# Patient Record
Sex: Female | Born: 1950
Health system: Southern US, Community
[De-identification: ages and names within clinical notes are randomized; demographics above are authoritative.]

## PROBLEM LIST (undated history)

## (undated) DIAGNOSIS — C8510 Unspecified B-cell lymphoma, unspecified site: Secondary | ICD-10-CM

## (undated) HISTORY — PX: OTHER SURGICAL HISTORY: SHX169

---

## 2017-06-26 DIAGNOSIS — C8510 Unspecified B-cell lymphoma, unspecified site: Secondary | ICD-10-CM

## 2017-06-26 HISTORY — DX: Unspecified B-cell lymphoma, unspecified site: C85.10

## 2017-07-13 ENCOUNTER — Ambulatory Visit (HOSPITAL_BASED_OUTPATIENT_CLINIC_OR_DEPARTMENT_OTHER): Payer: Self-pay

## 2017-07-13 ENCOUNTER — Telehealth: Payer: Self-pay | Admitting: Hematology

## 2017-07-13 ENCOUNTER — Encounter: Payer: Self-pay | Admitting: Hematology

## 2017-07-13 ENCOUNTER — Ambulatory Visit (HOSPITAL_BASED_OUTPATIENT_CLINIC_OR_DEPARTMENT_OTHER): Payer: Self-pay | Admitting: Hematology

## 2017-07-13 ENCOUNTER — Other Ambulatory Visit (HOSPITAL_COMMUNITY)
Admission: RE | Admit: 2017-07-13 | Discharge: 2017-07-13 | Disposition: A | Discharge status: Home / Self Care | Payer: Self-pay | Source: Ambulatory Visit | Attending: Hematology | Admitting: Hematology

## 2017-07-13 ENCOUNTER — Encounter: Payer: Self-pay | Admitting: *Deleted

## 2017-07-13 VITALS — BP 119/73 | HR 78 | Temp 98.2°F | Resp 18 | Ht 66.0 in | Wt 147.3 lb

## 2017-07-13 DIAGNOSIS — D649 Anemia, unspecified: Secondary | ICD-10-CM

## 2017-07-13 DIAGNOSIS — C8307 Small cell B-cell lymphoma, spleen: Secondary | ICD-10-CM

## 2017-07-13 DIAGNOSIS — D696 Thrombocytopenia, unspecified: Secondary | ICD-10-CM

## 2017-07-13 DIAGNOSIS — R5383 Other fatigue: Secondary | ICD-10-CM

## 2017-07-13 DIAGNOSIS — D72829 Elevated white blood cell count, unspecified: Secondary | ICD-10-CM

## 2017-07-13 DIAGNOSIS — R161 Splenomegaly, not elsewhere classified: Secondary | ICD-10-CM

## 2017-07-13 DIAGNOSIS — Z832 Family history of diseases of the blood and blood-forming organs and certain disorders involving the immune mechanism: Secondary | ICD-10-CM

## 2017-07-13 DIAGNOSIS — D61818 Other pancytopenia: Secondary | ICD-10-CM

## 2017-07-13 DIAGNOSIS — Z801 Family history of malignant neoplasm of trachea, bronchus and lung: Secondary | ICD-10-CM

## 2017-07-13 LAB — CBC & DIFF AND RETIC
BASO%: 0.1 % (ref 0.0–2.0)
BASOS ABS: 0 10*3/uL (ref 0.0–0.1)
EOS%: 0.2 % (ref 0.0–7.0)
Eosinophils Absolute: 0 10*3/uL (ref 0.0–0.5)
HEMATOCRIT: 26.9 % — AB (ref 34.8–46.6)
HGB: 8.4 g/dL — ABNORMAL LOW (ref 11.6–15.9)
Immature Retic Fract: 21.5 % — ABNORMAL HIGH (ref 1.60–10.00)
LYMPH%: 72.4 % — AB (ref 14.0–49.7)
MCH: 27.5 pg (ref 25.1–34.0)
MCHC: 31.2 g/dL — AB (ref 31.5–36.0)
MCV: 88.2 fL (ref 79.5–101.0)
MONO#: 0.9 10*3/uL (ref 0.1–0.9)
MONO%: 10.1 % (ref 0.0–14.0)
NEUT#: 1.5 10*3/uL (ref 1.5–6.5)
NEUT%: 17.2 % — AB (ref 38.4–76.8)
PLATELETS: 49 10*3/uL — AB (ref 145–400)
RBC: 3.05 10*6/uL — ABNORMAL LOW (ref 3.70–5.45)
RDW: 19.1 % — ABNORMAL HIGH (ref 11.2–14.5)
Retic %: 2.33 % — ABNORMAL HIGH (ref 0.70–2.10)
Retic Ct Abs: 71.07 10*3/uL (ref 33.70–90.70)
WBC: 8.9 10*3/uL (ref 3.9–10.3)
lymph#: 6.4 10*3/uL — ABNORMAL HIGH (ref 0.9–3.3)

## 2017-07-13 LAB — COMPREHENSIVE METABOLIC PANEL
ALT: <6 U/L (ref 0–55)
ANION GAP: 9 meq/L (ref 3–11)
AST: 12 U/L (ref 5–34)
Albumin: 3.5 g/dL (ref 3.5–5.0)
Alkaline Phosphatase: 54 U/L (ref 40–150)
BILIRUBIN TOTAL: 1.82 mg/dL — AB (ref 0.20–1.20)
BUN: 20.3 mg/dL (ref 7.0–26.0)
CALCIUM: 8.6 mg/dL (ref 8.4–10.4)
CHLORIDE: 104 meq/L (ref 98–109)
CO2: 24 meq/L (ref 22–29)
CREATININE: 0.9 mg/dL (ref 0.6–1.1)
Glucose: 94 mg/dl (ref 70–140)
Potassium: 4.6 mEq/L (ref 3.5–5.1)
Sodium: 137 mEq/L (ref 136–145)
Total Protein: 7 g/dL (ref 6.4–8.3)

## 2017-07-13 LAB — TECHNOLOGIST REVIEW

## 2017-07-13 LAB — LACTATE DEHYDROGENASE: LDH: 232 U/L (ref 125–245)

## 2017-07-13 MED ORDER — LORAZEPAM 0.5 MG PO TABS: 0.5000 mg | ORAL_TABLET | Freq: Three times a day (TID) | ORAL | 0 refills | Status: DC | PRN

## 2017-07-13 MED ORDER — PREDNISONE 20 MG PO TABS: 60.0000 mg | ORAL_TABLET | Freq: Every day | ORAL | 0 refills | Status: DC

## 2017-07-13 MED FILL — LORazepam 0.5 MG TABS: 0.5 | 10 days supply | Qty: 30 | Fill #0

## 2017-07-13 MED FILL — predniSONE 20 MG TABS: 20 | 7 days supply | Qty: 21 | Fill #0

## 2017-07-13 NOTE — Progress Notes (Signed)
[place] Work  Clinical Social Work received call from [place], patient's friend, requesting resources available for patient.  Patient has met with financial advocate and has been referred to Rob, for drug replacement assistance.  CSW unaware of other resources available at this time.  Medical oncology note from today's appointment is not available yet and patient awaiting PET scan.  Patient or friend Caren Griffins, plan to follow up with CSW team in a few weeks to explore potential resources available.   Maryjean Morn, MSW, LCSW, OSW-C Clinical Social Worker [facility] [phone]

## 2017-07-13 NOTE — Telephone Encounter (Signed)
Scheduled appt per 10/18 los - unable to schedule treatment yet due to no treatment plan and capped day - emailed Jenny Reichmann will call patient when appt is made. Central radiology to contact patient with PET

## 2017-07-13 NOTE — Progress Notes (Signed)
START ON PATHWAY REGIMEN - Lymphoma and CLL     Administer weekly:     Rituximab   **Always confirm dose/schedule in your pharmacy ordering system**    Patient Characteristics: Marginal Zone Lymphoma, Systemic, First Line, Symptomatic Disease Type: Marginal Zone Lymphoma Disease Type: Not Applicable Disease Type: Not Applicable Localized or Systemic Disease<= Systemic [place] Stage: IV Line of therapy: First Line Asymptomatic or Symptomatic<= Symptomatic Intent of Therapy: Non-Curative / Palliative Intent, Discussed with Patient

## 2017-07-13 NOTE — Patient Instructions (Signed)
Thank you for choosing [facility] to provide your oncology and hematology care.  To afford each patient quality time with our providers, please arrive 30 minutes before your scheduled appointment time.  If you arrive late for your appointment, you may be asked to reschedule.  We strive to give you quality time with our providers, and arriving late affects you and other patients whose appointments are after yours.   If you are a no show for multiple scheduled visits, you may be dismissed from the clinic at the providers discretion.    Again, thank you for choosing [facility], our hope is that these requests will decrease the amount of time that you wait before being seen by our physicians.  ______________________________________________________________________  Should you have questions after your visit to the  Cancer Center, please contact our office at [phone] between the hours of 8:30 and 4:30 p.m.    Voicemails left after 4:30p.m will not be returned until the following business day.    For prescription refill requests, please have your pharmacy contact us directly.  Please also try to allow 48 hours for prescription requests.    Please contact the scheduling department for questions regarding scheduling.  For scheduling of procedures such as PET scans, CT scans, MRI, Ultrasound, etc please contact central scheduling at [phone].    Resources For Cancer Patients and Caregivers:   Oncolink.org:  A wonderful resource for patients and healthcare providers for information regarding your disease, ways to tract your treatment, what to expect, etc.     American Cancer Society:  800-227-2345  Can help patients locate various types of support and financial assistance  Cancer Care: 1-800-813-HOPE (4673) Provides financial assistance, online support groups, medication/co-pay assistance.    [place] DSS:  336-641-3447 Where to apply for food  stamps, Medicaid, and utility assistance  Medicare Rights Center: 800-333-4114 Helps people with Medicare understand their rights and benefits, navigate the Medicare system, and secure the quality healthcare they deserve  SCAT: 336-333-6589 [place] [employer]'s shared-ride transportation service for eligible riders who have a disability that prevents them from riding the fixed route bus.    For additional information on assistance programs please contact our social worker:   Grier Hock/Abigail Elmore:  [phone]            

## 2017-07-13 NOTE — Progress Notes (Signed)
HEMATOLOGY/ONCOLOGY CONSULTATION NOTE  Date of Service: 07/13/2017  Patient Care Team: Patient, No Pcp Per as PCP - General (General Practice)  CHIEF COMPLAINTS/PURPOSE OF CONSULTATION:  Splenic marginal zone B-cell Non-Hodgkin's lymphoma   HISTORY OF PRESENTING ILLNESS:   Michelle Cohen is a wonderful 66 y.o. female who has been referred to [place] from [place] [facility] for evaluation and management of B-cell lymphoma. She presents to her appointment today with her partner of 21 years. She states that she moved to the area ~2 years ago secondary to her sister's diagnosis of lung cancer (unfortunately, now passed) and taking care of her mother's Alzheimer's. Generally, prior to this issue she has been very healthy and without chronic medical conditions and not on chronic medical therapies/treatments.   Initially, the patient was admitted to [facility] on for abdominal pain and significant hernia. She reports that she was visiting in [place] and while flying there she had an acute onset of sweats and left lower quadrant pain. She reports that her hernia was not present at that time, but as she continued to fly her hernia had become more noticeable and enlarged in her lower abdomen. Additionally, her partner reports that her episode of hydrosis was very significant, stating that it was pooling below her. On their arrival to [facility] on 06/21/17, a CT A/P was performed which showed an incarcerated hernia. Of note, her CT showed massive splenomegaly at 32cm in greatest dimension. Manual reduction of her hernia was attempted while in the ED but was unsuccessful. Pre-operative notes state that following intubation manual reduction was performed successfully and hernia was repaired laparoscopically. CT CAP was performed as well which was without the presence of additional surrounding LAD. She did have flow cytometry performed during her admission as well which showed B-cell lymphocytes  which were monoclonal. A bone marrow biopsy was also examined which showed that it was markedly hypercellular (~95%) with prominent interstitial infiltrate of small lymphocytes.   Prior to her diagnosis, she mostly felt typically well overall. She states that she did suffer from insomnia which she related to over-thinking at the time regarding her familial situation. She did experience some fatigue and weight loss with this as well. Her partner reports that she noticed her weight loss approximately 45moago, which she quantifies at approximately 30lbs since onset. Towards the end of this six month periods and just prior to the appearance of her hernia the patient began to notice some bothersome abdominal distension. She also noticed some significant fatigue and she would nap for longer periods following her daily workouts. Additionally, her partner would notice that she would have issues with breathing and this appeared irregular and more labored, especially at night while laying down. She also mentions that she did experience intermittent night sweats throughout this six month period as well. She denies lightheadedness, dizziness, abdominal pain, back pain, fever, chills, abnormal bruising/bleeding, blood stools, epistaxis, hematuria, gingival bleeding, or other bleeding issues within this period. She has not received any blood transfusions and has not previously had a tattoo; she is unconcerned with prior HepC exposure.   Since her hernia surgery, she reports that she feels great symptomatically overall and much improved. She does have some issues with abdominal bloating and discomfort, but overall this has been manageable. No overt abdominal pain.   Throughout all of this, she has attempted to try to remain within good spirits. Losing her sister recently has been hard, but she has been working through it  with her family; however, her partner voices that they do not have strong social support in this area as  most of their support is in [place] where they previously lived. She has been working through her sister's death with grief counseling which has been helping her.    She has never previously smoked, but she reports some moderate second-hand smoke through her father at a younger age. Her father did die of a PE, but she is unaware of any other diagnoses of clotting disorders or cancers within her family. No h/o alcohol abuse, chemical/radiation exposures. She worked previously in Industrial/product designer.   MEDICAL HISTORY:  History reviewed. No pertinent past medical history.  SURGICAL HISTORY: History reviewed. No pertinent surgical history.  SOCIAL HISTORY: Social History   Social History  . Marital status: Unknown    Spouse name: N/A  . Number of children: N/A  . Years of education: N/A   Occupational History  . Not on file.   Social History Main Topics  . Smoking status: Never Smoker  . Smokeless tobacco: Never Used  . Alcohol use Yes     Comment: glass of wine occasionally  . Drug use: No  . Sexual activity: Not on file   Other Topics Concern  . Not on file   Social History Narrative  . No narrative on file    FAMILY HISTORY: Sister - lung cancer Mother -Alzheimers dementia  ALLERGIES:  has No Known Allergies.  MEDICATIONS:  Current Outpatient Prescriptions  Medication Sig Dispense Refill  . docusate sodium (COLACE) 100 MG capsule Take 100 mg by mouth 2 (two) times daily.    . folic acid (FOLVITE) 1 MG tablet Take 1 mg by mouth daily.    Marland Kitchen senna (SENOKOT) 8.6 MG TABS tablet Take 1 tablet by mouth.    Marland Kitchen LORazepam (ATIVAN) 0.5 MG tablet Take 1 tablet (0.5 mg total) by mouth every 8 (eight) hours as needed for anxiety. 30 tablet 0  . predniSONE (DELTASONE) 20 MG tablet Take 3 tablets (60 mg total) by mouth daily with breakfast. 21 tablet 0   No current facility-administered medications for this visit.     REVIEW OF SYSTEMS:    A 10+ POINT REVIEW OF  SYSTEMS WAS OBTAINED including neurology, dermatology, psychiatry, cardiac, respiratory, lymph, extremities, GI, GU, Musculoskeletal, constitutional, breasts, reproductive, HEENT.  All pertinent positives are noted in the HPI.  All others are negative.  PHYSICAL EXAMINATION: ECOG PERFORMANCE STATUS: 1 - Symptomatic but completely ambulatory  . Vitals:   07/13/17 1107  BP: 119/73  Pulse: 78  Resp: 18  Temp: 98.2 F (36.8 C)  SpO2: 100%   Filed Weights   07/13/17 1107  Weight: 147 lb 4.8 oz (66.8 kg)   .Body mass index is 23.77 kg/m.  GENERAL:alert, in no acute distress and comfortable SKIN: no acute rashes, no significant lesions EYES: conjunctiva are pink and non-injected, sclera anicteric OROPHARYNX: MMM, no exudates, no oropharyngeal erythema or ulceration NECK: supple, no JVD LYMPH:  no palpable lymphadenopathy in the cervical, axillary or inguinal regions LUNGS: clear to auscultation b/l with normal respiratory effort HEART: regular rate & rhythm ABDOMEN:  normoactive bowel sounds, non tender. Massive splenomegaly.  Extremity: 1+ pedal edema bilaterally.  PSYCH: alert & oriented x 3 with fluent speech NEURO: no focal motor/sensory deficits  LABORATORY DATA:  I have reviewed the data as listed  . CBC Latest Ref Rng & Units 07/13/2017  WBC 3.9 - 10.3 10e3/uL 8.9  Hemoglobin  11.6 - 15.9 g/dL 8.4(L)  Hematocrit 34.8 - 46.6 % 26.9(L)  Platelets 145 - 400 10e3/uL 49(L)   . CBC    Component Value Date/Time   WBC 8.9 07/13/2017 1325   RBC 3.05 (L) 07/13/2017 1325   HGB 8.4 (L) 07/13/2017 1325   HCT 26.9 (L) 07/13/2017 1325   PLT 49 (L) 07/13/2017 1325   MCV 88.2 07/13/2017 1325   MCH 27.5 07/13/2017 1325   MCHC 31.2 (L) 07/13/2017 1325   RDW 19.1 (H) 07/13/2017 1325   LYMPHSABS 6.4 (H) 07/13/2017 1325   MONOABS 0.9 07/13/2017 1325   EOSABS 0.0 07/13/2017 1325   BASOSABS 0.0 07/13/2017 1325    . CMP Latest Ref Rng & Units 07/13/2017  Glucose 70 - 140 mg/dl  94  BUN 7.0 - 26.0 mg/dL 20.3  Creatinine 0.6 - 1.1 mg/dL 0.9  Sodium 136 - 145 mEq/L 137  Potassium 3.5 - 5.1 mEq/L 4.6  CO2 22 - 29 mEq/L 24  Calcium 8.4 - 10.4 mg/dL 8.6  Total Protein 6.4 - 8.3 g/dL 7.0  Total Bilirubin 0.20 - 1.20 mg/dL 1.82(H)  Alkaline Phos 40 - 150 U/L 54  AST 5 - 34 U/L 12  ALT 0-55 U/L U/L <6   Component     Latest Ref Rng & Units 07/13/2017  LDH     125 - 245 U/L 232  Hep C Virus Ab     0.0 - 0.9 s/co ratio <0.1  HIV Screen 4th Generation wRfx     Non Reactive Non Reactive  Hep B Core Ab, Tot     Negative Negative  Hepatitis B Surface Ag     Negative Negative      RADIOGRAPHIC STUDIES: I have personally reviewed the radiological images as listed and agreed with the findings in the report.  PET/CT scheduled for 07/25/2017.   ASSESSMENT & PLAN:   Michelle Cohen is a wonderful 66 y.o. caucasian female who presents to our clinic to discuss ongoing management of the following:   1. 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  Her clinical presentation is also representative of this and that this may have been present over months to possibly years.  -With her pattern of involvement and results thus far it is likely that she has splenic marginal zone type lymphoma.  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 Plan -I would like to obtain a PET/CT scan to evaluate for possible unseen nodal involvement.  -Since she is fairly symptomatic, with significant splenomegaly and abnormal blood counts, I would recommend Rituxan antibody therapy at this time once weekly x4 to begin with ASAP -consideration of maintenance Rituxan to  possibly follow.  If molecular mutation testing dictates more specific therapies in the future such as Bendamustine, we will pursue this option as necessary.   -I would also like to begin her on Prednisone 13m p daily in the interim to assist with splenomegaly prior to beginning her Rituxan. -I will also prescribe her Ativan for sleeping. She is to discontinue Robaxin.  -We will schedule her for chemo class in the near future -We discussed in great detail the potential risks, benefits, and toxicities throughout treatment. A handout on this was given and  I informed her of the symptoms to be watchful of.  -Labs today including Hepatitis panel which was unrevealing. -I have urged her to establish primary care follow up within the area so that they may also managed her progress as well.   2. Pancytopenia with leukocytosis and anemia  -Platelets have improved with her prior records -Hgb has improved if this falls below <7 we will consider blood transfusion.  -We will obtain labs today to establish baseline throughout treatment.    Labs today PET/CT In 3-4 days Schedule to start  Rituxan in 1 week (weekly x 4 doses) Chemo-counseling for Rituxan RTC with Dr Irene Limbo in 7 days with D1 Rituxan with labs  All of the patients questions were answered with apparent satisfaction. The patient knows to call the clinic with any problems, questions or concerns.  I spent 60 minutes counseling the patient face to face. The total time spent in the appointment was 80 minutes and more than 50% was on counseling and direct patient cares.  Sullivan Lone MD MS AAHIVMS [facility] Hematology/Oncology Physician [facility]  (Office):       [phone] (Work cell):  [phone] (Fax):           [phone]  07/13/2017 3:01 PM  This document serves as a record of services personally performed by Sullivan Lone, MD. It was created on his behalf by Reola Mosher, a trained medical scribe. The creation  of this record is based on the scribe's personal observations and the provider's statements to them. This document has been checked and approved by the attending provider.

## 2017-07-13 NOTE — Progress Notes (Signed)
Met w/ pt to introduce myself as her Arboriculturist.  Pt is uninsured and needed assistance w/ paying for medication.  I informed her of the [place], went over what it covers and gave her an expense sheet.  She provided me w/ proof of income and was approved for the $400 [place].  I will notify Rob to research possible drug replacement for her.  She has my card for any questions or concerns she may have in the future.

## 2017-07-14 ENCOUNTER — Encounter: Payer: Self-pay | Admitting: Pharmacy Technician

## 2017-07-14 ENCOUNTER — Telehealth: Payer: Self-pay | Admitting: Hematology

## 2017-07-14 LAB — HIV ANTIBODY (ROUTINE TESTING W REFLEX): HIV Screen 4th Generation wRfx: NONREACTIVE

## 2017-07-14 LAB — HEPATITIS C ANTIBODY: Hep C Virus Ab: <0.1 s/co ratio (ref 0.0–0.9)

## 2017-07-14 LAB — HEPATITIS B CORE ANTIBODY, TOTAL: Hep B Core Ab, Tot: NEGATIVE

## 2017-07-14 LAB — HEPATITIS B SURFACE ANTIGEN: HEP B S AG: NEGATIVE

## 2017-07-14 NOTE — Progress Notes (Signed)
I will meet with patient during chemo ed about drug assistance for Rituxan.

## 2017-07-14 NOTE — Telephone Encounter (Signed)
Scheduled treatment per 10/18 los - patient aware and will pick up a new schedule Monday 10/22

## 2017-07-17 ENCOUNTER — Encounter: Payer: Self-pay | Admitting: Pharmacy Technician

## 2017-07-17 ENCOUNTER — Other Ambulatory Visit: Payer: Self-pay

## 2017-07-17 LAB — FLOW CYTOMETRY

## 2017-07-17 NOTE — Progress Notes (Signed)
The patient is approved for drug assistance by [facility] for Rituxan with coverage from 07/17/17 -07/17/18. The first DOS (Day 1 / Cycle 1) is scheduled for 07/21/17. Enrollment is based on Self Pay.

## 2017-07-18 ENCOUNTER — Other Ambulatory Visit: Payer: Self-pay | Admitting: Hematology

## 2017-07-21 ENCOUNTER — Ambulatory Visit (HOSPITAL_BASED_OUTPATIENT_CLINIC_OR_DEPARTMENT_OTHER): Payer: Self-pay | Admitting: Hematology

## 2017-07-21 ENCOUNTER — Ambulatory Visit: Payer: Self-pay | Admitting: Medical

## 2017-07-21 ENCOUNTER — Encounter: Payer: Self-pay | Admitting: Hematology

## 2017-07-21 ENCOUNTER — Other Ambulatory Visit (HOSPITAL_BASED_OUTPATIENT_CLINIC_OR_DEPARTMENT_OTHER): Payer: Self-pay

## 2017-07-21 ENCOUNTER — Ambulatory Visit (HOSPITAL_BASED_OUTPATIENT_CLINIC_OR_DEPARTMENT_OTHER): Payer: Self-pay

## 2017-07-21 VITALS — BP 123/68 | HR 93 | Temp 99.7°F | Resp 18

## 2017-07-21 VITALS — BP 123/67 | HR 76 | Temp 99.2°F | Resp 18 | Ht 66.0 in | Wt 144.8 lb

## 2017-07-21 DIAGNOSIS — C8307 Small cell B-cell lymphoma, spleen: Secondary | ICD-10-CM

## 2017-07-21 DIAGNOSIS — Z5112 Encounter for antineoplastic immunotherapy: Secondary | ICD-10-CM

## 2017-07-21 DIAGNOSIS — T8090XA Unspecified complication following infusion and therapeutic injection, initial encounter: Secondary | ICD-10-CM

## 2017-07-21 DIAGNOSIS — D696 Thrombocytopenia, unspecified: Secondary | ICD-10-CM

## 2017-07-21 DIAGNOSIS — R05 Cough: Secondary | ICD-10-CM

## 2017-07-21 DIAGNOSIS — D649 Anemia, unspecified: Secondary | ICD-10-CM

## 2017-07-21 LAB — CBC & DIFF AND RETIC
BASO%: 0.1 % (ref 0.0–2.0)
Basophils Absolute: 0 10*3/uL (ref 0.0–0.1)
EOS%: 0.1 % (ref 0.0–7.0)
Eosinophils Absolute: 0 10*3/uL (ref 0.0–0.5)
HCT: 31.1 % — ABNORMAL LOW (ref 34.8–46.6)
HGB: 9.7 g/dL — ABNORMAL LOW (ref 11.6–15.9)
IMMATURE RETIC FRACT: 8.8 % (ref 1.60–10.00)
LYMPH%: 81 % — AB (ref 14.0–49.7)
MCH: 27.7 pg (ref 25.1–34.0)
MCHC: 31.2 g/dL — AB (ref 31.5–36.0)
MCV: 88.9 fL (ref 79.5–101.0)
MONO#: 1 10*3/uL — AB (ref 0.1–0.9)
MONO%: 6.8 % (ref 0.0–14.0)
NEUT%: 12 % — ABNORMAL LOW (ref 38.4–76.8)
NEUTROS ABS: 1.8 10*3/uL (ref 1.5–6.5)
PLATELETS: 65 10*3/uL — AB (ref 145–400)
RBC: 3.5 10*6/uL — AB (ref 3.70–5.45)
RDW: 18.6 % — ABNORMAL HIGH (ref 11.2–14.5)
Retic %: 2.35 % — ABNORMAL HIGH (ref 0.70–2.10)
Retic Ct Abs: 82.25 10*3/uL (ref 33.70–90.70)
WBC: 14.9 10*3/uL — AB (ref 3.9–10.3)
lymph#: 12.1 10*3/uL — ABNORMAL HIGH (ref 0.9–3.3)

## 2017-07-21 LAB — COMPREHENSIVE METABOLIC PANEL
AST: 10 U/L (ref 5–34)
Albumin: 3.4 g/dL — ABNORMAL LOW (ref 3.5–5.0)
Alkaline Phosphatase: 47 U/L (ref 40–150)
Anion Gap: 11 mEq/L (ref 3–11)
BUN: 26 mg/dL (ref 7.0–26.0)
CHLORIDE: 106 meq/L (ref 98–109)
CO2: 21 meq/L — AB (ref 22–29)
CREATININE: 0.9 mg/dL (ref 0.6–1.1)
Calcium: 8.5 mg/dL (ref 8.4–10.4)
EGFR: >60 mL/min/{1.73_m2} (ref >60)
Glucose: 134 mg/dl (ref 70–140)
POTASSIUM: 4.1 meq/L (ref 3.5–5.1)
Sodium: 137 mEq/L (ref 136–145)
Total Bilirubin: 2.07 mg/dL — ABNORMAL HIGH (ref 0.20–1.20)
Total Protein: 6.6 g/dL (ref 6.4–8.3)

## 2017-07-21 LAB — CHCC SMEAR

## 2017-07-21 LAB — TECHNOLOGIST REVIEW

## 2017-07-21 MED ORDER — METHYLPREDNISOLONE SODIUM SUCC 125 MG IJ SOLR
125.0000 mg | Freq: Once | INTRAMUSCULAR | Status: AC | PRN
  Administered 2017-07-21: 125 mg via INTRAVENOUS

## 2017-07-21 MED ORDER — ACETAMINOPHEN 325 MG PO TABS
ORAL_TABLET | ORAL | Status: AC
  Filled 2017-07-21: qty 2

## 2017-07-21 MED ORDER — RITUXIMAB CHEMO INJECTION 500 MG/50ML
375.0000 mg/m2 | Freq: Once | INTRAVENOUS | Status: AC
  Administered 2017-07-21: 700 mg via INTRAVENOUS
  Filled 2017-07-21: qty 50

## 2017-07-21 MED ORDER — ALLOPURINOL 100 MG PO TABS: 100.0000 mg | ORAL_TABLET | Freq: Two times a day (BID) | ORAL | 0 refills | Status: DC

## 2017-07-21 MED ORDER — DIPHENHYDRAMINE HCL 50 MG/ML IJ SOLN
25.0000 mg | Freq: Once | INTRAMUSCULAR | Status: AC | PRN
  Administered 2017-07-21: 25 mg via INTRAVENOUS

## 2017-07-21 MED ORDER — FAMOTIDINE IN NACL 20-0.9 MG/50ML-% IV SOLN
20.0000 mg | Freq: Two times a day (BID) | INTRAVENOUS | Status: DC
  Administered 2017-07-21: 20 mg via INTRAVENOUS

## 2017-07-21 MED ORDER — DIPHENHYDRAMINE HCL 25 MG PO CAPS
50.0000 mg | ORAL_CAPSULE | Freq: Once | ORAL | Status: AC
  Administered 2017-07-21: 50 mg via ORAL

## 2017-07-21 MED ORDER — DEXAMETHASONE SODIUM PHOSPHATE 10 MG/ML IJ SOLN
10.0000 mg | Freq: Once | INTRAMUSCULAR | Status: AC
  Administered 2017-07-21: 10 mg via INTRAVENOUS

## 2017-07-21 MED ORDER — ALBUTEROL SULFATE (2.5 MG/3ML) 0.083% IN NEBU
2.5000 mg | INHALATION_SOLUTION | Freq: Once | RESPIRATORY_TRACT | Status: AC | PRN
  Administered 2017-07-21: 2.5 mg via RESPIRATORY_TRACT
  Filled 2017-07-21: qty 3

## 2017-07-21 MED ORDER — SODIUM CHLORIDE 0.9 % IV SOLN: 10.0000 mg | Freq: Once | INTRAVENOUS | Status: DC

## 2017-07-21 MED ORDER — FAMOTIDINE IN NACL 20-0.9 MG/50ML-% IV SOLN
INTRAVENOUS | Status: AC
  Filled 2017-07-21: qty 50

## 2017-07-21 MED ORDER — DIPHENHYDRAMINE HCL 25 MG PO CAPS
ORAL_CAPSULE | ORAL | Status: AC
  Filled 2017-07-21: qty 2

## 2017-07-21 MED ORDER — SODIUM CHLORIDE 0.9 % IV SOLN
Freq: Once | INTRAVENOUS | Status: AC
  Administered 2017-07-21: 11:00:00 via INTRAVENOUS

## 2017-07-21 MED ORDER — ACETAMINOPHEN 325 MG PO TABS
650.0000 mg | ORAL_TABLET | Freq: Once | ORAL | Status: AC
  Administered 2017-07-21: 650 mg via ORAL

## 2017-07-21 MED ORDER — FAMOTIDINE IN NACL 20-0.9 MG/50ML-% IV SOLN
20.0000 mg | Freq: Once | INTRAVENOUS | Status: AC | PRN
  Administered 2017-07-21: 20 mg via INTRAVENOUS

## 2017-07-21 MED ORDER — DEXAMETHASONE SODIUM PHOSPHATE 10 MG/ML IJ SOLN
INTRAMUSCULAR | Status: AC
  Filled 2017-07-21: qty 1

## 2017-07-21 MED FILL — ALLOPURINOL 100 MG TABS: 100 | 15 days supply | Qty: 30 | Fill #0

## 2017-07-21 NOTE — Patient Instructions (Signed)
[facility] Discharge Instructions for Patients Receiving Chemotherapy  Today you received the following chemotherapy agents Rituxan.  To help prevent nausea and vomiting after your treatment, we encourage you to take your nausea medication as prescribed.   If you develop nausea and vomiting that is not controlled by your nausea medication, call the clinic.   BELOW ARE SYMPTOMS THAT SHOULD BE REPORTED IMMEDIATELY:  *FEVER GREATER THAN 100.5 F  *CHILLS WITH OR WITHOUT FEVER  NAUSEA AND VOMITING THAT IS NOT CONTROLLED WITH YOUR NAUSEA MEDICATION  *UNUSUAL SHORTNESS OF BREATH  *UNUSUAL BRUISING OR BLEEDING  TENDERNESS IN MOUTH AND THROAT WITH OR WITHOUT PRESENCE OF ULCERS  *URINARY PROBLEMS  *BOWEL PROBLEMS  UNUSUAL RASH Items with * indicate a potential emergency and should be followed up as soon as possible.  Feel free to call the clinic should you have any questions or concerns. The clinic phone number is [phone].  Please show the CHEMO ALERT CARD at check-in to the Emergency Department and triage nurse.  Rituximab injection What is this medicine? RITUXIMAB (ri TUX i mab) is a monoclonal antibody. It is used to treat certain types of cancer like non-Hodgkin lymphoma and chronic lymphocytic leukemia. It is also used to treat rheumatoid arthritis, granulomatosis with polyangiitis (or Wegener's granulomatosis), and microscopic polyangiitis. This medicine may be used for other purposes; ask your health care provider or pharmacist if you have questions. COMMON BRAND NAME(S): Rituxan What should I tell my health care provider before I take this medicine? They need to know if you have any of these conditions: -heart disease -infection (especially a virus infection such as hepatitis B, chickenpox, cold sores, or herpes) -immune system problems -irregular heartbeat -kidney disease -lung or breathing disease, like asthma -recently received or scheduled to  receive a vaccine -an unusual or allergic reaction to rituximab, mouse proteins, other medicines, foods, dyes, or preservatives -pregnant or trying to get pregnant -breast-feeding How should I use this medicine? This medicine is for infusion into a vein. It is administered in a hospital or clinic by a specially trained health care professional. A special MedGuide will be given to you by the pharmacist with each prescription and refill. Be sure to read this information carefully each time. Talk to your pediatrician regarding the use of this medicine in children. This medicine is not approved for use in children. Overdosage: If you think you have taken too much of this medicine contact a poison control center or emergency room at once. NOTE: This medicine is only for you. Do not share this medicine with others. What if I miss a dose? It is important not to miss a dose. Call your doctor or health care professional if you are unable to keep an appointment. What may interact with this medicine? -cisplatin -other medicines for arthritis like disease modifying antirheumatic drugs or tumor necrosis factor inhibitors -live virus vaccines This list may not describe all possible interactions. Give your health care provider a list of all the medicines, herbs, non-prescription drugs, or dietary supplements you use. Also tell them if you smoke, drink alcohol, or use illegal drugs. Some items may interact with your medicine. What should I watch for while using this medicine? Your condition will be monitored carefully while you are receiving this medicine. You may need blood work done while you are taking this medicine. This medicine can cause serious allergic reactions. To reduce your risk you may need to take medicine before treatment with this medicine. Take your medicine   as directed. In some patients, this medicine may cause a serious brain infection that may cause death. If you have any problems seeing,  thinking, speaking, walking, or standing, tell your doctor right away. If you cannot reach your doctor, urgently seek other source of medical care. Call your doctor or health care professional for advice if you get a fever, chills or sore throat, or other symptoms of a cold or flu. Do not treat yourself. This drug decreases your body's ability to fight infections. Try to avoid being around people who are sick. Do not become pregnant while taking this medicine or for 12 months after stopping it. Women should inform their doctor if they wish to become pregnant or think they might be pregnant. There is a potential for serious side effects to an unborn child. Talk to your health care professional or pharmacist for more information. What side effects may I notice from receiving this medicine? Side effects that you should report to your doctor or health care professional as soon as possible: -breathing problems -chest pain -dizziness or feeling faint -fast, irregular heartbeat -low blood counts - this medicine may decrease the number of white blood cells, red blood cells and platelets. You may be at increased risk for infections and bleeding. -mouth sores -redness, blistering, peeling or loosening of the skin, including inside the mouth (this can be added for any serious or exfoliative rash that could lead to hospitalization) -signs of infection - fever or chills, cough, sore throat, pain or difficulty passing urine -signs and symptoms of kidney injury like trouble passing urine or change in the amount of urine -signs and symptoms of liver injury like dark yellow or brown urine; general ill feeling or flu-like symptoms; light-colored stools; loss of appetite; nausea; right upper belly pain; unusually weak or tired; yellowing of the eyes or skin -stomach pain -vomiting Side effects that usually do not require medical attention (report to your doctor or health care professional if they continue or are  bothersome): -headache -joint pain -muscle cramps or muscle pain This list may not describe all possible side effects. Call your doctor for medical advice about side effects. You may report side effects to FDA at 1-800-FDA-1088. Where should I keep my medicine? This drug is given in a hospital or clinic and will not be stored at home. NOTE: This sheet is a summary. It may not cover all possible information. If you have questions about this medicine, talk to your doctor, pharmacist, or health care provider.  2018 Elsevier/Gold Standard (2016-04-20 15:28:09)  

## 2017-07-21 NOTE — Progress Notes (Signed)
HEMATOLOGY/ONCOLOGY FOLLOW UP NOTE  Date of Service: 07/21/2017  Patient Care Team: Patient, No Pcp Per as PCP - General (General Practice)  CHIEF COMPLAINTS/PURPOSE OF INITIAL CONSULTATION:  Splenic marginal zone B-cell Non-Hodgkin's lymphoma   HISTORY OF PRESENTING ILLNESS:   Michelle Cohen is a wonderful 66 y.o. female who has been referred to [place] from [place] [facility] for evaluation and management of B-cell lymphoma. She presents to her appointment today with her partner of 21 years. She states that she moved to the area ~2 years ago secondary to her sister's diagnosis of lung cancer (unfortunately, now passed) and taking care of her mother's Alzheimer's. Generally, prior to this issue she has been very healthy and without chronic medical conditions and not on chronic medical therapies/treatments.   Initially, the patient was admitted to [facility] on for abdominal pain and significant hernia. She reports that she was visiting in [place] and while flying there she had an acute onset of sweats and left lower quadrant pain. She reports that her hernia was not present at that time, but as she continued to fly her hernia had become more noticeable and enlarged in her lower abdomen. Additionally, her partner reports that her episode of hydrosis was very significant, stating that it was pooling below her. On their arrival to [facility] on 06/21/17, a CT A/P was performed which showed an incarcerated hernia. Of note, her CT showed massive splenomegaly at 32cm in greatest dimension. Manual reduction of her hernia was attempted while in the ED but was unsuccessful. Pre-operative notes state that following intubation manual reduction was performed successfully and hernia was repaired laparoscopically. CT CAP was performed as well which was without the presence of additional surrounding LAD. She did have flow cytometry performed during her admission as well which showed B-cell lymphocytes  which were monoclonal. A bone marrow biopsy was also examined which showed that it was markedly hypercellular (~95%) with prominent interstitial infiltrate of small lymphocytes.   Prior to her diagnosis, she mostly felt typically well overall. She states that she did suffer from insomnia which she related to over-thinking at the time regarding her familial situation. She did experience some fatigue and weight loss with this as well. Her partner reports that she noticed her weight loss approximately 36moago, which she quantifies at approximately 30lbs since onset. Towards the end of this six month periods and just prior to the appearance of her hernia the patient began to notice some bothersome abdominal distension. She also noticed some significant fatigue and she would nap for longer periods following her daily workouts. Additionally, her partner would notice that she would have issues with breathing and this appeared irregular and more labored, especially at night while laying down. She also mentions that she did experience intermittent night sweats throughout this six month period as well. She denies lightheadedness, dizziness, abdominal pain, back pain, fever, chills, abnormal bruising/bleeding, blood stools, epistaxis, hematuria, gingival bleeding, or other bleeding issues within this period. She has not received any blood transfusions and has not previously had a tattoo; she is unconcerned with prior HepC exposure.   Since her hernia surgery, she reports that she feels great symptomatically overall and much improved. She does have some issues with abdominal bloating and discomfort, but overall this has been manageable. No overt abdominal pain.   Throughout all of this, she has attempted to try to remain within good spirits. Losing her sister recently has been hard, but she has been working  through it with her family; however, her partner voices that they do not have strong social support in this area as  most of their support is in [place] where they previously lived. She has been working through her sister's death with grief counseling which has been helping her.    She has never previously smoked, but she reports some moderate second-hand smoke through her father at a younger age. Her father did die of a PE, but she is unaware of any other diagnoses of clotting disorders or cancers within her family. No h/o alcohol abuse, chemical/radiation exposures. She worked previously in Industrial/product designer.   CURRENT TREATMENT:  Rituximab weekly x4 beginning on 07/21/17  INTERIM HISTORY:  Michelle Cohen returns today for scheduled follow up. In the interim she has been doing very well. She has been tolerating the prednisone very well and this has not affected her sleeping. Her counts have improved while on prednisone (Hgb went from 8.4>9.7, platelets have also improved from 49>65). She did attend chemo class with Abigail Butts and she feels like all of her questions were answered. Hope to eat better with small more frequent meals.  MEDICAL HISTORY:  No past medical history on file.  SURGICAL HISTORY: No past surgical history on file.  SOCIAL HISTORY: Social History   Social History  . Marital status: Unknown    Spouse name: N/A  . Number of children: N/A  . Years of education: N/A   Occupational History  . Not on file.   Social History Main Topics  . Smoking status: Never Smoker  . Smokeless tobacco: Never Used  . Alcohol use Yes     Comment: glass of wine occasionally  . Drug use: No  . Sexual activity: Not on file   Other Topics Concern  . Not on file   Social History Narrative  . No narrative on file    FAMILY HISTORY: Sister - lung cancer Mother -Alzheimers dementia  ALLERGIES:  has No Known Allergies.  MEDICATIONS:  Current Outpatient Prescriptions  Medication Sig Dispense Refill  . docusate sodium (COLACE) 100 MG capsule Take 100 mg by mouth 2 (two) times  daily.    . folic acid (FOLVITE) 1 MG tablet Take 1 mg by mouth daily.    . predniSONE (DELTASONE) 20 MG tablet Take 3 tablets (60 mg total) by mouth daily with breakfast. 21 tablet 0  . senna (SENOKOT) 8.6 MG TABS tablet Take 1 tablet by mouth.    Marland Kitchen allopurinol (ZYLOPRIM) 100 MG tablet Take 1 tablet (100 mg total) by mouth 2 (two) times daily. 30 tablet 0  . LORazepam (ATIVAN) 0.5 MG tablet Take 1 tablet (0.5 mg total) by mouth every 8 (eight) hours as needed for anxiety. (Patient not taking: Reported on 07/21/2017) 30 tablet 0   No current facility-administered medications for this visit.     REVIEW OF SYSTEMS:    A 10+ POINT REVIEW OF SYSTEMS WAS OBTAINED including neurology, dermatology, psychiatry, cardiac, respiratory, lymph, extremities, GI, GU, Musculoskeletal, constitutional, breasts, reproductive, HEENT.  All pertinent positives are noted in the HPI.  All others are negative.  PHYSICAL EXAMINATION: ECOG PERFORMANCE STATUS: 1 - Symptomatic but completely ambulatory . Vitals:   07/21/17 0919  BP: 123/67  Pulse: 76  Resp: 18  Temp: 99.2 F (37.3 C)  SpO2: 98%   Filed Weights   07/21/17 0919  Weight: 144 lb 12.8 oz (65.7 kg)   .Body mass index is 23.37 kg/m.  GENERAL:alert, in no  acute distress and comfortable SKIN: no acute rashes, no significant lesions EYES: conjunctiva are pink and non-injected, sclera anicteric OROPHARYNX: MMM, no exudates, no oropharyngeal erythema or ulceration NECK: supple, no JVD LYMPH:  no palpable lymphadenopathy in the cervical, axillary or inguinal regions LUNGS: clear to auscultation b/l with normal respiratory effort HEART: regular rate & rhythm ABDOMEN:  normoactive bowel sounds, non tender. Massive splenomegaly.  Extremity: 1+ pedal edema bilaterally.  PSYCH: alert & oriented x 3 with fluent speech NEURO: no focal motor/sensory deficits  LABORATORY DATA:  I have reviewed the data as listed  . CBC Latest Ref Rng & Units  07/21/2017 07/13/2017  WBC 3.9 - 10.3 10e3/uL 14.9(H) 8.9  Hemoglobin 11.6 - 15.9 g/dL 9.7(L) 8.4(L)  Hematocrit 34.8 - 46.6 % 31.1(L) 26.9(L)  Platelets 145 - 400 10e3/uL 65(L) 49(L)   . CBC    Component Value Date/Time   WBC 14.9 (H) 07/21/2017 0829   RBC 3.50 (L) 07/21/2017 0829   HGB 9.7 (L) 07/21/2017 0829   HCT 31.1 (L) 07/21/2017 0829   PLT 65 (L) 07/21/2017 0829   MCV 88.9 07/21/2017 0829   MCH 27.7 07/21/2017 0829   MCHC 31.2 (L) 07/21/2017 0829   RDW 18.6 (H) 07/21/2017 0829   LYMPHSABS 12.1 (H) 07/21/2017 0829   MONOABS 1.0 (H) 07/21/2017 0829   EOSABS 0.0 07/21/2017 0829   BASOSABS 0.0 07/21/2017 0829    . CMP Latest Ref Rng & Units 07/21/2017 07/13/2017  Glucose 70 - 140 mg/dl 134 94  BUN 7.0 - 26.0 mg/dL 26.0 20.3  Creatinine 0.6 - 1.1 mg/dL 0.9 0.9  Sodium 136 - 145 mEq/L 137 137  Potassium 3.5 - 5.1 mEq/L 4.1 4.6  CO2 22 - 29 mEq/L 21(L) 24  Calcium 8.4 - 10.4 mg/dL 8.5 8.6  Total Protein 6.4 - 8.3 g/dL 6.6 7.0  Total Bilirubin 0.20 - 1.20 mg/dL 2.07(H) 1.82(H)  Alkaline Phos 40 - 150 U/L 47 54  AST 5 - 34 U/L 10 12  ALT 0-55 U/L U/L <6 <6   Component     Latest Ref Rng & Units 07/13/2017  LDH     125 - 245 U/L 232  Hep C Virus Ab     0.0 - 0.9 s/co ratio <0.1  HIV Screen 4th Generation wRfx     Non Reactive Non Reactive  Hep B Core Ab, Tot     Negative Negative  Hepatitis B Surface Ag     Negative Negative      RADIOGRAPHIC STUDIES: I have personally reviewed the radiological images as listed and agreed with the findings in the report.  PET/CT scheduled for 07/25/2017.   ASSESSMENT & PLAN:   Michelle Cohen is a wonderful 66 y.o. caucasian female who presents to our clinic to discuss ongoing management of the following:   1. 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  Her clinical presentation is also representative of this and that this may have been present over months to possibly years.  -With her pattern of involvement and results thus far it is likely that she has splenic marginal zone type lymphoma.  2. 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 Slightly improved with Prednisone. Plan -I would like  to obtain a PET/CT scan to evaluate for possible unseen nodal involvement. This is scheduled for 07/25/17.  -she will start Weekly Rituxan x 4 doses from today. -consideration of maintenance Rituxan to possibly follow. -I have urged her to establish primary care follow up within the area so that they may also managed her progress as well.  -We will stop her course of prednisone now that she is beginning treatment.  -We will place her on allopurinolfor TLS prophylaxis . I also encouraged her to continue to increase her water intake to help with this as well. (atleast 48-64oz daily)  2. Pancytopenia with leukocytosis and anemia  -Platelets have improved with her prior records -Hgb has improved if this falls below <7 we will consider blood transfusion.  -Count improved with short prednisone course prior to treatment, will hopefully continue to improve throughout treatment. We will monitor.    3. Rituxan hypersensitivity reaction - infusion rate related Had some muscle cramping and mild SOB needing additional steroids, benadryl and pepacid and Albuterol. Had to complete Rituxan at lower rate. Patient would not be a candidate for rapid infusion protocol and would need to keep the next infusion at the tolerate rate and not rate escalate. -added pepcid and singulair to premedications.   PET/CT on 10/30 RTC with Dr Irene Limbo in 1 week with labs and for toxicity check prior to C2D1  All of the patients  questions were answered with apparent satisfaction. The patient knows to call the clinic with any problems, questions or concerns.  I spent 30 minutes counseling the patient face to face. The total time spent in the appointment was 40 minutes and more than 50% was on counseling and direct patient cares.  Sullivan Lone MD [place] AAHIVMS [facility] Hematology/Oncology Physician [facility]  (Office):       [phone] (Work cell):  [phone] (Fax):           [phone]  07/21/2017 8:58 AM  This document serves as a record of services personally performed by Sullivan Lone, MD. It was created on his behalf by Reola Mosher, a trained medical scribe. The creation of this record is based on the scribe's personal observations and the provider's statements to them. This document has been checked and approved by the attending provider.

## 2017-07-21 NOTE — Progress Notes (Signed)
1315 - Upon assessment of vital signs before rate increase, patient showed a decrease in O2 saturation. Pulse varying between 30s - 60s. Sandi Mealy, PA notified and to infusion chairside to assess. Per Lucianne Lei, pause infusion for 53minutes and monitor. NS started during observation. Pepcid given per order of Sandi Mealy, and infusion to restart 47minutes post pepcid completion. Infusion restarted and patient tolerated well.  1425 - Cough noted from earlier to be unchanged and nebulizer given before next rate increase per Sandi Mealy. Patient infusion restarted and patient tolerated well.   1555 Patient reports bilateral leg myalgia and diarrhea. Patient states leg pain is not typical for her. Infusion stopped. Normal saline wide open. VSS.   Brimson, PA notified. Chairside. Order given and carried out for Solu-Medrol 125 mg IV push, Benadryl 25 mg IV push and Famotidine 300 mg IVPB.   1557 Solu-Medrol 125 mg IV push administered  1600 Benadryl 25 mg IV push administered  1604 Famotidine 300 mg IVPB administered.   1607 Patient verbalized leg myalgia has resolved completely.   1615 - Infusion restarted at previous rate.

## 2017-07-21 NOTE — Progress Notes (Signed)
Sympt[facility] Management Clinic Progress Note   Michelle Cohen [id number] 24-Jul-1951 66 y.o.  Michelle Cohen is managed by Dr. Sullivan Lone  Actively treated with chemotherapy: yes  Current Therapy: Rituximab  Last Treated: 10 / 26 / 2018  Assessment: Plan:    Splenic marginal zone b-cell lymphoma (HCC)  Infusion reaction, initial encounter   Splenic marginal zone B-cell lymphoma: The patient received cycle 1 of rituximab today.  Infusion reaction, initial encounter: The patient was seen in the infusion room when she was having a slight drop your oxygen saturation to 91%, a pulses range from 30-60%, flushing, and a cough. Rituxan was paused and the patient was given Pepcid 20 mg IV with improvement in her symptoms. Her cough recurred. Rituxan was again paused and she was given an albuterol nebulizer and was better. I was later call back to the infusion room began when the patient was having a recurrence of her cough, flushing, and significant myalgias in her legs. Rituxan was again paused and she was given a another dose of Pepcid 20 mg IV, Benadryl 25 mg IV and methylprednisolone 125 mg IV. Dr. Sullivan Lone was contacted. The patient's symptoms resolved. She was restarted at a lower level of her Rituxan infusion. The patient did not have a recurrence of symptoms.    Please see After Visit Summary for patient specific instructions.  Future Appointments Date Time Provider [place]  07/25/2017 7:00 AM [facility] MOBILE [facility] [place]  07/28/2017 12:00 PM [facility] H28 [facility] None  08/04/2017 11:00 AM [facility] G24 [facility] None  08/11/2017 10:00 AM [facility] E14 [facility] None    No orders of the defined types were placed in this encounter.      Subjective:   Patient ID:  Michelle Cohen is a 66 y.o. (DOB 17-Sep-1951) female.  Chief Complaint: No chief complaint on file.   HPI Michelle Puckettis a 66 year old female with a diagnosis of a splenic marginal  zone B-cell lymphoma. She received cycle 1 of rituximab today. She was seen in the infusion room when she was having a slight drop your oxygen saturation to 91%, a pulses range from 30-60%, flushing, and a cough. Rituxan was paused and the patient was given Pepcid 20 mg IV with improvement in her symptoms. Her cough recurred. Rituxan was again paused and she was given an albuterol nebulizer and was better. I was later called back to the infusion room when the patient was having a recurrence of her cough, flushing, and significant myalgias in her legs. Rituxan was again paused and she was given a another dose of Pepcid 20 mg IV, Benadryl 25 mg IV and methylprednisolone 125 mg IV. Dr. Sullivan Lone was contacted. The patient's symptoms resolved. She was restarted at a lower level of her Rituxan infusion. The patient did not have a recurrence of symptoms.    Medications: I have reviewed the patient's current medications.  Allergies: No Known Allergies  No past medical history on file.  No past surgical history on file.  No family history on file.  Social History   Social History  . Marital status: Unknown    Spouse name: N/A  . Number of children: N/A  . Years of education: N/A   Occupational History  . Not on file.   Social History Main Topics  . Smoking status: Never Smoker  . Smokeless tobacco: Never Used  . Alcohol use Yes     Comment: glass of wine occasionally  . Drug use: No  . Sexual  activity: Not on file   Other Topics Concern  . Not on file   Social History Narrative  . No narrative on file    Past Medical History, Surgical history, Social history, and Family history were reviewed and updated as appropriate.   Please see review of systems for further details on the patient's review from today.   Review of Systems:  Review of Systems  Constitutional: Negative for chills, diaphoresis and fever.  Respiratory: Positive for cough. Negative for choking, chest tightness,  shortness of breath and wheezing.   Cardiovascular: Negative for chest pain, palpitations and leg swelling.  Gastrointestinal: Negative for nausea and vomiting.  Musculoskeletal: Positive for myalgias.  Skin:       Flushing of the cheeks    Objective:   Physical Exam:  There were no vitals taken for this visit. ECOG: 1  Physical Exam  Constitutional: No distress.  HENT:  Head: Normocephalic and atraumatic.  Cardiovascular: Normal rate, regular rhythm and normal heart sounds.  Exam reveals no gallop and no friction rub.   No murmur heard. Pulmonary/Chest: Effort normal and breath sounds normal. No respiratory distress. She has no wheezes. She has no rales.  Musculoskeletal: She exhibits no edema.  Skin: Skin is warm and dry. She is not diaphoretic.    Lab Review:     Component Value Date/Time   NA 137 07/21/2017 0829   K 4.1 07/21/2017 0829   CO2 21 (L) 07/21/2017 0829   GLUCOSE 134 07/21/2017 0829   BUN 26.0 07/21/2017 0829   CREATININE 0.9 07/21/2017 0829   CALCIUM 8.5 07/21/2017 0829   PROT 6.6 07/21/2017 0829   ALBUMIN 3.4 (L) 07/21/2017 0829   AST 10 07/21/2017 0829   ALT <6 07/21/2017 0829   ALKPHOS 47 07/21/2017 0829   BILITOT 2.07 (H) 07/21/2017 0829       Component Value Date/Time   WBC 14.9 (H) 07/21/2017 0829   RBC 3.50 (L) 07/21/2017 0829   HGB 9.7 (L) 07/21/2017 0829   HCT 31.1 (L) 07/21/2017 0829   PLT 65 (L) 07/21/2017 0829   MCV 88.9 07/21/2017 0829   MCH 27.7 07/21/2017 0829   MCHC 31.2 (L) 07/21/2017 0829   RDW 18.6 (H) 07/21/2017 0829   LYMPHSABS 12.1 (H) 07/21/2017 0829   MONOABS 1.0 (H) 07/21/2017 0829   EOSABS 0.0 07/21/2017 0829   BASOSABS 0.0 07/21/2017 0829   -------------------------------  Imaging from last 24 hours (if applicable):  Radiology interpretation: No results found.

## 2017-07-24 ENCOUNTER — Telehealth: Payer: Self-pay | Admitting: *Deleted

## 2017-07-24 NOTE — Telephone Encounter (Signed)
LVM for chemo follow-up call.  Also received after hours triage note stating pt called c/o mouth sores.  Requested call back to review current symptoms, call back number provided.

## 2017-07-24 NOTE — Telephone Encounter (Signed)
She does have an rx for [facility]'s mouth wash. She got this last night. Is not getting better yet. Is not getting worse. She  Feels it will get better. Red blisters on inside cheeks and upper and lower lip, some are open. They are feeling better.  Fever blister in corner of mouth- gave permission to use zovirax cream.   Needs folic acid refill. Original rx was given in [place]. Please check with Dr Irene Limbo and refill at [facility] outpatient pharmacy.   Is eating soft gentle foods, is drinking plenty, no fever, no constipation nor diarrhea

## 2017-07-25 ENCOUNTER — Ambulatory Visit (HOSPITAL_COMMUNITY)
Admission: RE | Admit: 2017-07-25 | Discharge: 2017-07-25 | Disposition: A | Discharge status: Home / Self Care | Payer: Self-pay | Source: Ambulatory Visit | Attending: Hematology | Admitting: Hematology

## 2017-07-25 ENCOUNTER — Encounter (HOSPITAL_COMMUNITY): Payer: Self-pay | Admitting: Radiology

## 2017-07-25 DIAGNOSIS — I2510 Atherosclerotic heart disease of native coronary artery without angina pectoris: Secondary | ICD-10-CM | POA: Insufficient documentation

## 2017-07-25 DIAGNOSIS — K4090 Unilateral inguinal hernia, without obstruction or gangrene, not specified as recurrent: Secondary | ICD-10-CM | POA: Insufficient documentation

## 2017-07-25 DIAGNOSIS — R161 Splenomegaly, not elsewhere classified: Secondary | ICD-10-CM | POA: Insufficient documentation

## 2017-07-25 DIAGNOSIS — K5730 Diverticulosis of large intestine without perforation or abscess without bleeding: Secondary | ICD-10-CM | POA: Insufficient documentation

## 2017-07-25 DIAGNOSIS — C8307 Small cell B-cell lymphoma, spleen: Secondary | ICD-10-CM | POA: Insufficient documentation

## 2017-07-25 DIAGNOSIS — R5383 Other fatigue: Secondary | ICD-10-CM | POA: Insufficient documentation

## 2017-07-25 DIAGNOSIS — I517 Cardiomegaly: Secondary | ICD-10-CM | POA: Insufficient documentation

## 2017-07-25 LAB — GLUCOSE, CAPILLARY: Glucose-Capillary: 114 mg/dL — ABNORMAL HIGH (ref 65–99)

## 2017-07-25 MED ORDER — FLUDEOXYGLUCOSE F - 18 (FDG) INJECTION
6.9000 | Freq: Once | INTRAVENOUS | Status: AC | PRN
  Administered 2017-07-25: 6.9 via INTRAVENOUS

## 2017-07-26 ENCOUNTER — Other Ambulatory Visit: Payer: Self-pay | Admitting: *Deleted

## 2017-07-26 MED ORDER — FOLIC ACID 1 MG PO TABS: 1.0000 mg | ORAL_TABLET | Freq: Every day | ORAL | 2 refills | Status: DC

## 2017-07-26 MED FILL — FOLIC ACID 1 MG TABLET: 1 | 30 days supply | Qty: 30 | Fill #0

## 2017-07-27 ENCOUNTER — Telehealth: Payer: Self-pay | Admitting: Hematology

## 2017-07-27 NOTE — Progress Notes (Signed)
HEMATOLOGY/ONCOLOGY FOLLOW UP NOTE  Date of Service: 07/28/2017  Patient Care Team: Patient, No Pcp Per as PCP - General (General Practice)  CHIEF COMPLAINTS/PURPOSE OF INITIAL CONSULTATION:  Splenic marginal zone B-cell Non-Hodgkin's lymphoma   HISTORY OF PRESENTING ILLNESS:   Michelle Cohen is a wonderful 66 y.o. female who has been referred to [place] from [place] [facility] for evaluation and management of B-cell lymphoma. She presents to her appointment today with her partner of 21 years. She states that she moved to the area ~2 years ago secondary to her sister's diagnosis of lung cancer (unfortunately, now passed) and taking care of her mother's Alzheimer's. Generally, prior to this issue she has been very healthy and without chronic medical conditions and not on chronic medical therapies/treatments.   Initially, the patient was admitted to [facility] on for abdominal pain and significant hernia. She reports that she was visiting in [place] and while flying there she had an acute onset of sweats and left lower quadrant pain. She reports that her hernia was not present at that time, but as she continued to fly her hernia had become more noticeable and enlarged in her lower abdomen. Additionally, her partner reports that her episode of hydrosis was very significant, stating that it was pooling below her. On their arrival to [facility] on 06/21/17, a CT A/P was performed which showed an incarcerated hernia. Of note, her CT showed massive splenomegaly at 32cm in greatest dimension. Manual reduction of her hernia was attempted while in the ED but was unsuccessful. Pre-operative notes state that following intubation manual reduction was performed successfully and hernia was repaired laparoscopically. CT CAP was performed as well which was without the presence of additional surrounding LAD. She did have flow cytometry performed during her admission as well which showed B-cell lymphocytes  which were monoclonal. A bone marrow biopsy was also examined which showed that it was markedly hypercellular (~95%) with prominent interstitial infiltrate of small lymphocytes.   Prior to her diagnosis, she mostly felt typically well overall. She states that she did suffer from insomnia which she related to over-thinking at the time regarding her familial situation. She did experience some fatigue and weight loss with this as well. Her partner reports that she noticed her weight loss approximately 64moago, which she quantifies at approximately 30lbs since onset. Towards the end of this six month periods and just prior to the appearance of her hernia the patient began to notice some bothersome abdominal distension. She also noticed some significant fatigue and she would nap for longer periods following her daily workouts. Additionally, her partner would notice that she would have issues with breathing and this appeared irregular and more labored, especially at night while laying down. She also mentions that she did experience intermittent night sweats throughout this six month period as well. She denies lightheadedness, dizziness, abdominal pain, back pain, fever, chills, abnormal bruising/bleeding, blood stools, epistaxis, hematuria, gingival bleeding, or other bleeding issues within this period. She has not received any blood transfusions and has not previously had a tattoo; she is unconcerned with prior HepC exposure.   Since her hernia surgery, she reports that she feels great symptomatically overall and much improved. She does have some issues with abdominal bloating and discomfort, but overall this has been manageable. No overt abdominal pain.   Throughout all of this, she has attempted to try to remain within good spirits. Losing her sister recently has been hard, but she has been working  through it with her family; however, her partner voices that they do not have strong social support in this area as  most of their support is in  where they previously lived. She has been working through her sister's death with grief counseling which has been helping her.    She has never previously smoked, but she reports some moderate second-hand smoke through her father at a younger age. Her father did die of a PE, but she is unaware of any other diagnoses of clotting disorders or cancers within her family. No h/o alcohol abuse, chemical/radiation exposures. She worked previously in Industrial/product designer.   CURRENT TREATMENT:  Rituximab weekly x4 beginning on 07/21/17   INTERIM HISTORY:   Michelle Cohen returns today for scheduled follow up. In the interim she has been doing very well. She is accompanied by her partner of 21 years, Caren Griffins. During her last treatment with Rituximab, she reports that she felt a sudden flush feeling to her entire body about 1.5 hours into her treatment. Also during her last treatment, she reported bilateral posterior upper leg pain with cramps and pain.  She denies CP or SOB at the time of her last treatment. She notes that she was unable to catch a breath during her treatment. Her symptoms resolved with pepcid, benadryl, and decadron. She was then able to lay on her side to aid in alleviating her symptoms.   Since her last visit to the office, she has had a PET scan completed on 07/25/2017 with results showing: IMPRESSION: 1. Massive splenomegaly, splenic volume 5770 cubic cm, with homogeneous low grade activity throughout the spleen equal to that of the physiologic activity in the liver, and above the background blood pool activity in the mediastinum. No pathologic adenopathy identified. No bony involvement noted. 2. Other imaging findings of potential clinical significance: Aortic Atherosclerosis (ICD10-I70.0). Mild cardiomegaly. Low-density blood pool suggesting anemia. Sigmoid diverticulosis. Small left groin hernia containing adipose tissue and a small amount  of fluid.  On review of systems, she reports she had upper lip swelling 5 days ago and noted that she had oral ulcers. She reports that her oral ulcers resolved at this time and she treated it with magic mouthwash. She reports urinary freqeuncy. She has a prior hx of seasonal allergies on last year, but none noted this year. She reports weight loss of 3 lbs. She denies decreased appetite. She reports intermittent mild left sided abdominal pain resembling a soreness sensation. She denies issues with bowel movements.   No fevers no chills no night sweats.  No new weight loss. 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  MEDICAL HISTORY:  No past medical history on file.  SURGICAL HISTORY: No past surgical history on file.  SOCIAL HISTORY: Social History   Socioeconomic History  . Marital status: Single    Spouse name: Not on file  . Number of children: Not on file  . Years of education: Not on file  . Highest education level: Not on file  Social Needs  . Financial resource strain: Not on file  . Food insecurity - worry: Not on file  . Food insecurity - inability: Not on file  . Transportation needs - medical: Not on file  . Transportation needs - non-medical: Not on file  Occupational History  . Not on file  Tobacco Use  . Smoking status: Never Smoker  . Smokeless tobacco: Never Used  Substance and Sexual Activity  . Alcohol use: Yes  Comment: glass of wine occasionally  . Drug use: No  . Sexual activity: Not on file  Other Topics Concern  . Not on file  Social History Narrative  . Not on file    FAMILY HISTORY: Sister - lung cancer Mother -Alzheimers dementia  ALLERGIES:  has No Known Allergies.  MEDICATIONS:  Current Outpatient Prescriptions  Medication Sig Dispense Refill  . allopurinol (ZYLOPRIM) 100 MG tablet Take 1 tablet (100 mg total) by mouth 2 (two) times daily. 30 tablet 0  . docusate sodium (COLACE) 100 MG capsule Take 100 mg by mouth 2 (two)  times daily.    . folic acid (FOLVITE) 1 MG tablet Take 1 tablet (1 mg total) by mouth daily. 30 tablet 2  . LORazepam (ATIVAN) 0.5 MG tablet Take 1 tablet (0.5 mg total) by mouth every 8 (eight) hours as needed for anxiety. (Patient not taking: Reported on 07/21/2017) 30 tablet 0  . predniSONE (DELTASONE) 20 MG tablet Take 3 tablets (60 mg total) by mouth daily with breakfast. 21 tablet 0  . senna (SENOKOT) 8.6 MG TABS tablet Take 1 tablet by mouth.     No current facility-administered medications for this visit.     REVIEW OF SYSTEMS:    A 10+ POINT REVIEW OF SYSTEMS WAS OBTAINED including neurology, dermatology, psychiatry, cardiac, respiratory, lymph, extremities, GI, GU, Musculoskeletal, constitutional, breasts, reproductive, HEENT.  All pertinent positives are noted in the HPI.  All others are negative.  PHYSICAL EXAMINATION:  ECOG PERFORMANCE STATUS: 1 - Symptomatic but completely ambulatory . Vitals:   07/28/17 1133  BP: (!) 130/55  Resp: 17  Temp: 98.9 F (37.2 C)  SpO2: 100%   Filed Weights   07/28/17 1133  Weight: 140 lb 12.8 oz (63.9 kg)   .Body mass index is 22.73 kg/m.  GENERAL:alert, in no acute distress and comfortable SKIN: no acute rashes, no significant lesions EYES: conjunctiva are pink and non-injected, sclera anicteric OROPHARYNX: MMM, no exudates, no oropharyngeal erythema or ulceration NECK: supple, no JVD LYMPH:  no palpable lymphadenopathy in the cervical, axillary or inguinal regions LUNGS: clear to auscultation b/l with normal respiratory effort HEART: regular rate & rhythm ABDOMEN:  normoactive bowel sounds, non tender. Massive splenomegaly.  Extremity: resolved 1+ pedal edema bilaterally.   PSYCH: alert & oriented x 3 with fluent speech NEURO: no focal motor/sensory deficits  LABORATORY DATA:  I have reviewed the data as listed  . CBC Latest Ref Rng & Units 07/28/2017 07/21/2017 07/13/2017  WBC 3.9 - 10.3 10e3/uL 9.2 14.9(H) 8.9   Hemoglobin 11.6 - 15.9 g/dL 8.5(L) 9.7(L) 8.4(L)  Hematocrit 34.8 - 46.6 % 27.0(L) 31.1(L) 26.9(L)  Platelets 145 - 400 10e3/uL 48(L) 65(L) 49(L)   . CBC    Component Value Date/Time   WBC 9.2 07/28/2017 1105   RBC 3.10 (L) 07/28/2017 1105   HGB 8.5 (L) 07/28/2017 1105   HCT 27.0 (L) 07/28/2017 1105   PLT 48 (L) 07/28/2017 1105   MCV 87.1 07/28/2017 1105   MCH 27.4 07/28/2017 1105   MCHC 31.5 07/28/2017 1105   RDW 18.3 (H) 07/28/2017 1105   LYMPHSABS 7.1 (H) 07/28/2017 1105   MONOABS 0.5 07/28/2017 1105   EOSABS 0.0 07/28/2017 1105   BASOSABS 0.0 07/28/2017 1105    . CMP Latest Ref Rng & Units 07/28/2017 07/21/2017 07/13/2017  Glucose 70 - 140 mg/dl 93 134 94  BUN 7.0 - 26.0 mg/dL 16.7 26.0 20.3  Creatinine 0.6 - 1.1 mg/dL 0.8 0.9 0.9  Sodium 136 -  145 mEq/L 136 137 137  Potassium 3.5 - 5.1 mEq/L 4.2 4.1 4.6  CO2 22 - 29 mEq/L 21(L) 21(L) 24  Calcium 8.4 - 10.4 mg/dL 8.7 8.5 8.6  Total Protein 6.4 - 8.3 g/dL 6.9 6.6 7.0  Total Bilirubin 0.20 - 1.20 mg/dL 2.33(H) 2.07(H) 1.82(H)  Alkaline Phos 40 - 150 U/L 44 47 54  AST 5 - 34 U/L '11 10 12  ' ALT 0 - 55 U/L 8 <6 <6   Component     Latest Ref Rng & Units 07/13/2017  LDH     125 - 245 U/L 232  Hep C Virus Ab     0.0 - 0.9 s/co ratio <0.1  HIV Screen 4th Generation wRfx     Non Reactive Non Reactive  Hep B Core Ab, Tot     Negative Negative  Hepatitis B Surface Ag     Negative Negative   Component     Latest Ref Rng & Units 07/28/2017  LDH     125 - 245 U/L 200  Uric Acid, Serum     2.6 - 7.4 mg/dl 6.0  Phosphorus     2.5 - 4.5 mg/dL 3.8      RADIOGRAPHIC STUDIES: I have personally reviewed the radiological images as listed and agreed with the findings in the report. Nm Pet Image Initial (pi) Skull Base To Thigh  Result Date: 07/25/2017 CLINICAL DATA:  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 EXAM: NUCLEAR MEDICINE PET SKULL BASE TO THIGH TECHNIQUE: 6.9 mCi F-18 FDG was  injected intravenously. Full-ring PET imaging was performed from the skull base to thigh after the radiotracer. CT data was obtained and used for attenuation correction and anatomic localization. FASTING BLOOD GLUCOSE:  Value: 114 mg/dl COMPARISON:  None. FINDINGS: NECK No hypermetabolic lymph nodes in the neck. CHEST No hypermetabolic mediastinal or hilar nodes. No suspicious pulmonary nodules on the CT scan. Atherosclerotic calcification of the aortic arch. Mild cardiomegaly. Low-density blood pool. Background mediastinal blood pool activity 2.0. ABDOMEN/PELVIS The spleen measures 24.2 by 14.4 by 31.6 cm (volume = 5770 cm^3). Both splenic density and metabolic activity are fairly homogeneous, with maximum SUV of a large representative portion of the spleen at 3.2. Mildly prominent vasculature along the splenic hilum. Surrounding structures are displaced by the massively enlarged spleen. No appreciable hypermetabolic adenopathy. Physiologic uptake in bowel. Small left groin hernia contains adipose tissue and a small amount of fluid. Sigmoid diverticulosis without active diverticulitis. Background hepatic uptake level 3.2. SKELETON No focal hypermetabolic activity to suggest skeletal metastasis. IMPRESSION: 1. Massive splenomegaly, splenic volume 5770 cubic cm, with homogeneous low grade activity throughout the spleen equal to that of the physiologic activity in the liver, and above the background blood pool activity in the mediastinum. No pathologic adenopathy identified. No bony involvement noted. 2. Other imaging findings of potential clinical significance: Aortic Atherosclerosis (ICD10-I70.0). Mild cardiomegaly. Low-density blood pool suggesting anemia. Sigmoid diverticulosis. Small left groin hernia containing adipose tissue and a small amount of fluid. Electronically Signed   By: Van Clines M.D.   On: 07/25/2017 09:28      ASSESSMENT & PLAN:   Karie Skowron is a wonderful 66 y.o. caucasian  female who presents to our clinic to discuss ongoing management of the following:   1. 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  Her clinical presentation is also representative of this and that this may have been present over months to possibly years.  -With her pattern of involvement and results thus far it is likely that she has splenic marginal zone type lymphoma.  -PET scan on 07/25/2017 with results showing: Massive splenomegaly, splenic volume 5770 cubic cm, with homogeneous low grade activity throughout the spleen equal to that of the physiologic activity in the liver, and above the background blood pool activity in the mediastinum. No pathologic adenopathy identified. No bony involvement noted.   2. 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  -Hgb at 9.7 as of 07/21/2017  -PLT at 48k as of 07/28/2017 Hgb at 8.5 as of 07/28/2017 -Count improved with short prednisone course prior to treatment, will hopefully continue to improve throughout treatment. We will monitor.    3. Rituxan hypersensitivity reaction - infusion rate related Had some muscle cramping and mild SOB needing additional steroids, benadryl and pepacid and Albuterol. Had to complete Rituxan at lower rate. Patient would not be a candidate for rapid infusion protocol and would need to keep the next infusion at the tolerate rate and not rate escalate. -added pepcid, steroids and singulair to premedications. -Advised the patient to use anti-histamine, Zyrtec or Claritin in  between treatments   Plan -Labs stable. No treatment-related prohibitive toxicity. -No overt significant tumor lysis syndrome at this time. -Patient appropriate to proceed with her second cycle of Rituxan with adjusted premedications.  She tolerated the Rituxan better and was able to gradually dose escalate without significant toxicities. -continue allopurinol for TLS prophylaxis for another week.  Will discontinue next week if no evidence of tumor lysis -Advised to continue to increase her water intake to help with this as well (at least 48-64 oz daily).   4. Oral mucositis/ HSV outbreak resolving -will prescribe with acyclovir for prophylaxis   Continue Rituxan weekly--please schedule C3 and C4 Labs and RTC with Dr. Irene Limbo with C3 in 1 week.   All of the patients questions were answered with apparent satisfaction. The patient knows to call the clinic with any problems, questions or concerns.  I spent 20 minutes counseling the patient face to face. The total time spent in the appointment was 25 minutes and more than 50% was on counseling and direct patient cares.  Sullivan Lone MD [place] AAHIVMS [facility] Hematology/Oncology Physician [facility]  (Office):       [phone] (Work cell):  [phone] (Fax):           [phone]  07/28/2017 11:38 AM  This document serves as a record of services personally performed by Sullivan Lone, MD. It was created on his behalf by Steva Colder, a trained medical scribe. The creation of this record is based on the scribe's personal observations and the provider's statements to them. This document has been checked and approved by the attending provider.

## 2017-07-27 NOTE — Telephone Encounter (Signed)
Called and confirmed appt for 11/2 per 10/26 los - patient is aware of appts.   Scheduled appts per 10/26 los.

## 2017-07-28 ENCOUNTER — Encounter: Payer: Self-pay | Admitting: Hematology

## 2017-07-28 ENCOUNTER — Other Ambulatory Visit (HOSPITAL_BASED_OUTPATIENT_CLINIC_OR_DEPARTMENT_OTHER): Payer: Self-pay

## 2017-07-28 ENCOUNTER — Ambulatory Visit (HOSPITAL_BASED_OUTPATIENT_CLINIC_OR_DEPARTMENT_OTHER): Payer: Self-pay

## 2017-07-28 ENCOUNTER — Ambulatory Visit (HOSPITAL_BASED_OUTPATIENT_CLINIC_OR_DEPARTMENT_OTHER): Payer: Self-pay | Admitting: Hematology

## 2017-07-28 VITALS — BP 119/53 | HR 93 | Temp 99.2°F | Resp 20

## 2017-07-28 VITALS — BP 130/55 | Temp 98.9°F | Resp 17 | Ht 66.0 in | Wt 140.8 lb

## 2017-07-28 DIAGNOSIS — C8307 Small cell B-cell lymphoma, spleen: Secondary | ICD-10-CM

## 2017-07-28 DIAGNOSIS — D649 Anemia, unspecified: Secondary | ICD-10-CM

## 2017-07-28 DIAGNOSIS — D72829 Elevated white blood cell count, unspecified: Secondary | ICD-10-CM

## 2017-07-28 DIAGNOSIS — R109 Unspecified abdominal pain: Secondary | ICD-10-CM

## 2017-07-28 DIAGNOSIS — B002 Herpesviral gingivostomatitis and pharyngotonsillitis: Secondary | ICD-10-CM

## 2017-07-28 DIAGNOSIS — Z5112 Encounter for antineoplastic immunotherapy: Secondary | ICD-10-CM

## 2017-07-28 DIAGNOSIS — Z7189 Other specified counseling: Secondary | ICD-10-CM

## 2017-07-28 DIAGNOSIS — R300 Dysuria: Secondary | ICD-10-CM

## 2017-07-28 DIAGNOSIS — T8090XA Unspecified complication following infusion and therapeutic injection, initial encounter: Secondary | ICD-10-CM

## 2017-07-28 DIAGNOSIS — D61818 Other pancytopenia: Secondary | ICD-10-CM

## 2017-07-28 LAB — COMPREHENSIVE METABOLIC PANEL
ALT: 8 U/L (ref 0–55)
AST: 11 U/L (ref 5–34)
Albumin: 3.4 g/dL — ABNORMAL LOW (ref 3.5–5.0)
Alkaline Phosphatase: 44 U/L (ref 40–150)
Anion Gap: 9 mEq/L (ref 3–11)
BUN: 16.7 mg/dL (ref 7.0–26.0)
CO2: 21 meq/L — AB (ref 22–29)
Calcium: 8.7 mg/dL (ref 8.4–10.4)
Chloride: 106 mEq/L (ref 98–109)
Creatinine: 0.8 mg/dL (ref 0.6–1.1)
GLUCOSE: 93 mg/dL (ref 70–140)
POTASSIUM: 4.2 meq/L (ref 3.5–5.1)
Sodium: 136 mEq/L (ref 136–145)
Total Bilirubin: 2.33 mg/dL — ABNORMAL HIGH (ref 0.20–1.20)
Total Protein: 6.9 g/dL (ref 6.4–8.3)

## 2017-07-28 LAB — URIC ACID: Uric Acid, Serum: 6 mg/dl (ref 2.6–7.4)

## 2017-07-28 LAB — CBC & DIFF AND RETIC
BASO%: 0.1 % (ref 0.0–2.0)
BASOS ABS: 0 10*3/uL (ref 0.0–0.1)
EOS%: 0.4 % (ref 0.0–7.0)
Eosinophils Absolute: 0 10*3/uL (ref 0.0–0.5)
HEMATOCRIT: 27 % — AB (ref 34.8–46.6)
HGB: 8.5 g/dL — ABNORMAL LOW (ref 11.6–15.9)
Immature Retic Fract: 8.9 % (ref 1.60–10.00)
LYMPH#: 7.1 10*3/uL — AB (ref 0.9–3.3)
LYMPH%: 76.8 % — AB (ref 14.0–49.7)
MCH: 27.4 pg (ref 25.1–34.0)
MCHC: 31.5 g/dL (ref 31.5–36.0)
MCV: 87.1 fL (ref 79.5–101.0)
MONO#: 0.5 10*3/uL (ref 0.1–0.9)
MONO%: 4.9 % (ref 0.0–14.0)
NEUT#: 1.6 10*3/uL (ref 1.5–6.5)
NEUT%: 17.8 % — AB (ref 38.4–76.8)
PLATELETS: 48 10*3/uL — AB (ref 145–400)
RBC: 3.1 10*6/uL — AB (ref 3.70–5.45)
RDW: 18.3 % — ABNORMAL HIGH (ref 11.2–14.5)
Retic %: 1.99 % (ref 0.70–2.10)
Retic Ct Abs: 61.69 10*3/uL (ref 33.70–90.70)
WBC: 9.2 10*3/uL (ref 3.9–10.3)

## 2017-07-28 LAB — LACTATE DEHYDROGENASE: LDH: 200 U/L (ref 125–245)

## 2017-07-28 MED ORDER — MONTELUKAST SODIUM 10 MG PO TABS
ORAL_TABLET | ORAL | Status: AC
  Filled 2017-07-28: qty 1

## 2017-07-28 MED ORDER — DIPHENHYDRAMINE HCL 50 MG/ML IJ SOLN
50.0000 mg | Freq: Once | INTRAMUSCULAR | Status: AC
  Administered 2017-07-28: 50 mg via INTRAVENOUS

## 2017-07-28 MED ORDER — SODIUM CHLORIDE 0.9 % IV SOLN
Freq: Once | INTRAVENOUS | Status: AC
  Administered 2017-07-28: 12:00:00 via INTRAVENOUS

## 2017-07-28 MED ORDER — FAMOTIDINE IN NACL 20-0.9 MG/50ML-% IV SOLN
20.0000 mg | Freq: Once | INTRAVENOUS | Status: AC
  Administered 2017-07-28: 20 mg via INTRAVENOUS

## 2017-07-28 MED ORDER — DIPHENHYDRAMINE HCL 50 MG/ML IJ SOLN
INTRAMUSCULAR | Status: AC
  Filled 2017-07-28: qty 1

## 2017-07-28 MED ORDER — FAMOTIDINE IN NACL 20-0.9 MG/50ML-% IV SOLN
INTRAVENOUS | Status: AC
  Filled 2017-07-28: qty 50

## 2017-07-28 MED ORDER — SODIUM CHLORIDE 0.9 % IV SOLN
375.0000 mg/m2 | Freq: Once | INTRAVENOUS | Status: AC
  Administered 2017-07-28: 700 mg via INTRAVENOUS
  Filled 2017-07-28: qty 50

## 2017-07-28 MED ORDER — DEXAMETHASONE SODIUM PHOSPHATE 10 MG/ML IJ SOLN
INTRAMUSCULAR | Status: AC
  Filled 2017-07-28: qty 1

## 2017-07-28 MED ORDER — ACETAMINOPHEN 325 MG PO TABS
650.0000 mg | ORAL_TABLET | Freq: Once | ORAL | Status: AC
  Administered 2017-07-28: 650 mg via ORAL

## 2017-07-28 MED ORDER — DIPHENHYDRAMINE HCL 25 MG PO CAPS
ORAL_CAPSULE | ORAL | Status: AC
  Filled 2017-07-28: qty 2

## 2017-07-28 MED ORDER — ACETAMINOPHEN 325 MG PO TABS
ORAL_TABLET | ORAL | Status: AC
  Filled 2017-07-28: qty 2

## 2017-07-28 MED ORDER — MONTELUKAST SODIUM 10 MG PO TABS
10.0000 mg | ORAL_TABLET | Freq: Once | ORAL | Status: AC
  Administered 2017-07-28: 10 mg via ORAL

## 2017-07-28 MED ORDER — DEXAMETHASONE SODIUM PHOSPHATE 10 MG/ML IJ SOLN
10.0000 mg | Freq: Once | INTRAMUSCULAR | Status: AC
  Administered 2017-07-28: 10 mg via INTRAVENOUS

## 2017-07-28 MED ORDER — ACYCLOVIR 200 MG PO CAPS: 400.0000 mg | ORAL_CAPSULE | Freq: Two times a day (BID) | ORAL | 0 refills | Status: DC

## 2017-07-28 MED ORDER — SODIUM CHLORIDE 0.9 % IV SOLN: 10.0000 mg | Freq: Once | INTRAVENOUS | Status: DC

## 2017-07-28 MED FILL — ACYCLOVIR 200 MG CAP: 200 | 15 days supply | Qty: 60 | Fill #0

## 2017-07-28 NOTE — Progress Notes (Signed)
Per Maudie Mercury, infusion RN.  Ms. Polhamus will not finish rituxan infusion until 9pm at 8ml/hr.  Per Dr. Irene Limbo, ok to increase to next rate, monitor closely for signs of reaction, and repeat tylenol/benadryl after initial dose.  Vista Lawman, RN aware  At 150mg  dose rate, patient will finish rituxan infusion at 8pm.  Per Dr. Irene Limbo, ok to continue to increase rate every 15 minutes if pt tolerating well.  Patient needs to complete entire dose.  Infusion RN aware, will continue to monitor.

## 2017-07-28 NOTE — Progress Notes (Signed)
Per Dr Irene Limbo ok to tx with today's labs. Pt has dry cough r/t seasonal allergies. MD aware.   During Ritixan infusion, Pt's  cheeks started turning flushed (pink) @ 1424. Rate of Rituxan is 65mL/hr (100 mg.hr), and at the end of this bump up ( appx 25 minutes of 46 mL/hr). VSS.  No other s/s.   Per Dr Irene Limbo, we will keep Pt at current rate of 46 mL/hour (100mg /hr) for the next hour. If pt's cheeks loose their flushing (pink color) we will increase rate to 69 mL/hr (150 mg/hr), if not we will keep patient at 46 mL/hr. No New orders otherwise.   Pt remained flushed, left infusion running at 46 mL/hr for remainder of infusion. VSS.     Loren RN spoke with Dr Irene Limbo, per Dr Irene Limbo, we are to challenge pt at 57 mL/hr (150 mg/hr) and monitor closely. Tylenol 650 mg PO and benadryl 50 IV to be re-administered 4 hours after original dosing, which will be at 1633.   Per Dr Irene Limbo ok to challenge pt with increasing rate/dose every 15 minutes starting at the dosage of 200 mg/hr (91 mL/hr) orders carried out according to eMAR and VSS and charted in [facility].  Report given to Abelina Bachelor RN.

## 2017-07-28 NOTE — Patient Instructions (Signed)
[facility] Discharge Instructions for Patients Receiving Chemotherapy  Today you received the following chemotherapy agents Rituxan.  To help prevent nausea and vomiting after your treatment, we encourage you to take your nausea medication as prescribed.   If you develop nausea and vomiting that is not controlled by your nausea medication, call the clinic.   BELOW ARE SYMPTOMS THAT SHOULD BE REPORTED IMMEDIATELY:  *FEVER GREATER THAN 100.5 F  *CHILLS WITH OR WITHOUT FEVER  NAUSEA AND VOMITING THAT IS NOT CONTROLLED WITH YOUR NAUSEA MEDICATION  *UNUSUAL SHORTNESS OF BREATH  *UNUSUAL BRUISING OR BLEEDING  TENDERNESS IN MOUTH AND THROAT WITH OR WITHOUT PRESENCE OF ULCERS  *URINARY PROBLEMS  *BOWEL PROBLEMS  UNUSUAL RASH Items with * indicate a potential emergency and should be followed up as soon as possible.  Feel free to call the clinic should you have any questions or concerns. The clinic phone number is [phone].  Please show the CHEMO ALERT CARD at check-in to the Emergency Department and triage nurse.  Rituximab injection What is this medicine? RITUXIMAB (ri TUX i mab) is a monoclonal antibody. It is used to treat certain types of cancer like non-Hodgkin lymphoma and chronic lymphocytic leukemia. It is also used to treat rheumatoid arthritis, granulomatosis with polyangiitis (or Wegener's granulomatosis), and microscopic polyangiitis. This medicine may be used for other purposes; ask your health care provider or pharmacist if you have questions. COMMON BRAND NAME(S): Rituxan What should I tell my health care provider before I take this medicine? They need to know if you have any of these conditions: -heart disease -infection (especially a virus infection such as hepatitis B, chickenpox, cold sores, or herpes) -immune system problems -irregular heartbeat -kidney disease -lung or breathing disease, like asthma -recently received or scheduled to  receive a vaccine -an unusual or allergic reaction to rituximab, mouse proteins, other medicines, foods, dyes, or preservatives -pregnant or trying to get pregnant -breast-feeding How should I use this medicine? This medicine is for infusion into a vein. It is administered in a hospital or clinic by a specially trained health care professional. A special MedGuide will be given to you by the pharmacist with each prescription and refill. Be sure to read this information carefully each time. Talk to your pediatrician regarding the use of this medicine in children. This medicine is not approved for use in children. Overdosage: If you think you have taken too much of this medicine contact a poison control center or emergency room at once. NOTE: This medicine is only for you. Do not share this medicine with others. What if I miss a dose? It is important not to miss a dose. Call your doctor or health care professional if you are unable to keep an appointment. What may interact with this medicine? -cisplatin -other medicines for arthritis like disease modifying antirheumatic drugs or tumor necrosis factor inhibitors -live virus vaccines This list may not describe all possible interactions. Give your health care provider a list of all the medicines, herbs, non-prescription drugs, or dietary supplements you use. Also tell them if you smoke, drink alcohol, or use illegal drugs. Some items may interact with your medicine. What should I watch for while using this medicine? Your condition will be monitored carefully while you are receiving this medicine. You may need blood work done while you are taking this medicine. This medicine can cause serious allergic reactions. To reduce your risk you may need to take medicine before treatment with this medicine. Take your medicine   as directed. In some patients, this medicine may cause a serious brain infection that may cause death. If you have any problems seeing,  thinking, speaking, walking, or standing, tell your doctor right away. If you cannot reach your doctor, urgently seek other source of medical care. Call your doctor or health care professional for advice if you get a fever, chills or sore throat, or other symptoms of a cold or flu. Do not treat yourself. This drug decreases your body's ability to fight infections. Try to avoid being around people who are sick. Do not become pregnant while taking this medicine or for 12 months after stopping it. Women should inform their doctor if they wish to become pregnant or think they might be pregnant. There is a potential for serious side effects to an unborn child. Talk to your health care professional or pharmacist for more information. What side effects may I notice from receiving this medicine? Side effects that you should report to your doctor or health care professional as soon as possible: -breathing problems -chest pain -dizziness or feeling faint -fast, irregular heartbeat -low blood counts - this medicine may decrease the number of white blood cells, red blood cells and platelets. You may be at increased risk for infections and bleeding. -mouth sores -redness, blistering, peeling or loosening of the skin, including inside the mouth (this can be added for any serious or exfoliative rash that could lead to hospitalization) -signs of infection - fever or chills, cough, sore throat, pain or difficulty passing urine -signs and symptoms of kidney injury like trouble passing urine or change in the amount of urine -signs and symptoms of liver injury like dark yellow or brown urine; general ill feeling or flu-like symptoms; light-colored stools; loss of appetite; nausea; right upper belly pain; unusually weak or tired; yellowing of the eyes or skin -stomach pain -vomiting Side effects that usually do not require medical attention (report to your doctor or health care professional if they continue or are  bothersome): -headache -joint pain -muscle cramps or muscle pain This list may not describe all possible side effects. Call your doctor for medical advice about side effects. You may report side effects to FDA at 1-800-FDA-1088. Where should I keep my medicine? This drug is given in a hospital or clinic and will not be stored at home. NOTE: This sheet is a summary. It may not cover all possible information. If you have questions about this medicine, talk to your doctor, pharmacist, or health care provider.  2018 Elsevier/Gold Standard (2016-04-20 15:28:09)  

## 2017-07-28 NOTE — Progress Notes (Signed)
At completion of 250 mg =  114 ml , vital signs rechecked.  Temp of 100.0 orally.  Lucianne Lei, [place] notified.  PA assessed pt in infusion room.  OK to leave pt at rate  300 mg =  137 ml/hr until infusion completed. Per Lucianne Lei, [place].

## 2017-07-28 NOTE — Patient Instructions (Signed)
Thank you for choosing [facility] to provide your oncology and hematology care.  To afford each patient quality time with our providers, please arrive 30 minutes before your scheduled appointment time.  If you arrive late for your appointment, you may be asked to reschedule.  We strive to give you quality time with our providers, and arriving late affects you and other patients whose appointments are after yours.   If you are a no show for multiple scheduled visits, you may be dismissed from the clinic at the providers discretion.    Again, thank you for choosing [facility], our hope is that these requests will decrease the amount of time that you wait before being seen by our physicians.  ______________________________________________________________________  Should you have questions after your visit to the  Cancer Center, please contact our office at [phone] between the hours of 8:30 and 4:30 p.m.    Voicemails left after 4:30p.m will not be returned until the following business day.    For prescription refill requests, please have your pharmacy contact us directly.  Please also try to allow 48 hours for prescription requests.    Please contact the scheduling department for questions regarding scheduling.  For scheduling of procedures such as PET scans, CT scans, MRI, Ultrasound, etc please contact central scheduling at [phone].    Resources For Cancer Patients and Caregivers:   Oncolink.org:  A wonderful resource for patients and healthcare providers for information regarding your disease, ways to tract your treatment, what to expect, etc.     American Cancer Society:  800-227-2345  Can help patients locate various types of support and financial assistance  Cancer Care: 1-800-813-HOPE (4673) Provides financial assistance, online support groups, medication/co-pay assistance.    [place] DSS:  336-641-3447 Where to apply for food  stamps, Medicaid, and utility assistance  Medicare Rights Center: 800-333-4114 Helps people with Medicare understand their rights and benefits, navigate the Medicare system, and secure the quality healthcare they deserve  SCAT: 336-333-6589 [place] [employer]'s shared-ride transportation service for eligible riders who have a disability that prevents them from riding the fixed route bus.    For additional information on assistance programs please contact our social worker:   Grier Hock/Abigail Elmore:  [phone]            

## 2017-07-29 LAB — PHOSPHORUS: PHOSPHORUS: 3.8 mg/dL (ref 2.5–4.5)

## 2017-07-31 DIAGNOSIS — Z7189 Other specified counseling: Secondary | ICD-10-CM | POA: Insufficient documentation

## 2017-08-03 ENCOUNTER — Telehealth: Payer: Self-pay | Admitting: Hematology

## 2017-08-03 ENCOUNTER — Telehealth: Payer: Self-pay

## 2017-08-03 NOTE — Telephone Encounter (Signed)
Scheduled appt per 11/7 sch message - left message with appt date and time.

## 2017-08-03 NOTE — Telephone Encounter (Signed)
Pt called for her appt time tomorrow. During conversation she asked if a lab was supposed to be there as well. Investigation found that per 11/2 LOS lab and MD were supposed be scheduled as well. Pt is aware and awaiting a scheduling call.  Let Aldona Bar RN know and she stated she will handle it.

## 2017-08-04 ENCOUNTER — Other Ambulatory Visit (HOSPITAL_BASED_OUTPATIENT_CLINIC_OR_DEPARTMENT_OTHER): Payer: Self-pay

## 2017-08-04 ENCOUNTER — Telehealth: Payer: Self-pay | Admitting: Hematology

## 2017-08-04 ENCOUNTER — Ambulatory Visit (HOSPITAL_BASED_OUTPATIENT_CLINIC_OR_DEPARTMENT_OTHER): Payer: Self-pay | Admitting: Hematology

## 2017-08-04 ENCOUNTER — Other Ambulatory Visit: Payer: Self-pay

## 2017-08-04 ENCOUNTER — Telehealth: Payer: Self-pay

## 2017-08-04 ENCOUNTER — Ambulatory Visit: Payer: Self-pay | Admitting: Medical

## 2017-08-04 ENCOUNTER — Ambulatory Visit (HOSPITAL_BASED_OUTPATIENT_CLINIC_OR_DEPARTMENT_OTHER): Payer: Self-pay

## 2017-08-04 ENCOUNTER — Ambulatory Visit (HOSPITAL_COMMUNITY)
Admission: RE | Admit: 2017-08-04 | Discharge: 2017-08-04 | Disposition: A | Discharge status: Home / Self Care | Payer: Self-pay | Source: Ambulatory Visit | Attending: Hematology | Admitting: Hematology

## 2017-08-04 VITALS — BP 130/68 | HR 109 | Temp 100.2°F | Resp 18

## 2017-08-04 VITALS — BP 122/62 | HR 87 | Resp 20 | Ht 66.0 in | Wt 142.2 lb

## 2017-08-04 DIAGNOSIS — D649 Anemia, unspecified: Secondary | ICD-10-CM

## 2017-08-04 DIAGNOSIS — C8307 Small cell B-cell lymphoma, spleen: Secondary | ICD-10-CM

## 2017-08-04 DIAGNOSIS — D72829 Elevated white blood cell count, unspecified: Secondary | ICD-10-CM

## 2017-08-04 DIAGNOSIS — R61 Generalized hyperhidrosis: Secondary | ICD-10-CM

## 2017-08-04 DIAGNOSIS — L02435 Carbuncle of right lower limb: Secondary | ICD-10-CM

## 2017-08-04 DIAGNOSIS — Z7189 Other specified counseling: Secondary | ICD-10-CM

## 2017-08-04 DIAGNOSIS — Z5112 Encounter for antineoplastic immunotherapy: Secondary | ICD-10-CM

## 2017-08-04 DIAGNOSIS — D61818 Other pancytopenia: Secondary | ICD-10-CM

## 2017-08-04 DIAGNOSIS — T7840XA Allergy, unspecified, initial encounter: Secondary | ICD-10-CM

## 2017-08-04 DIAGNOSIS — K1230 Oral mucositis (ulcerative), unspecified: Secondary | ICD-10-CM

## 2017-08-04 DIAGNOSIS — T451X5A Adverse effect of antineoplastic and immunosuppressive drugs, initial encounter: Secondary | ICD-10-CM

## 2017-08-04 DIAGNOSIS — D696 Thrombocytopenia, unspecified: Secondary | ICD-10-CM

## 2017-08-04 LAB — CBC WITH DIFFERENTIAL/PLATELET
BASO%: 0.4 % (ref 0.0–2.0)
BASOS ABS: 0 10*3/uL (ref 0.0–0.1)
EOS%: 0.4 % (ref 0.0–7.0)
Eosinophils Absolute: 0 10*3/uL (ref 0.0–0.5)
HEMATOCRIT: 23.9 % — AB (ref 34.8–46.6)
HGB: 7.5 g/dL — ABNORMAL LOW (ref 11.6–15.9)
LYMPH#: 4.2 10*3/uL — AB (ref 0.9–3.3)
LYMPH%: 73.2 % — AB (ref 14.0–49.7)
MCH: 27.4 pg (ref 25.1–34.0)
MCHC: 31.4 g/dL — ABNORMAL LOW (ref 31.5–36.0)
MCV: 87.2 fL (ref 79.5–101.0)
MONO#: 0.3 10*3/uL (ref 0.1–0.9)
MONO%: 4.9 % (ref 0.0–14.0)
NEUT#: 1.2 10*3/uL — ABNORMAL LOW (ref 1.5–6.5)
NEUT%: 21.1 % — AB (ref 38.4–76.8)
PLATELETS: 56 10*3/uL — AB (ref 145–400)
RBC: 2.74 10*6/uL — ABNORMAL LOW (ref 3.70–5.45)
RDW: 18.7 % — ABNORMAL HIGH (ref 11.2–14.5)
WBC: 5.7 10*3/uL (ref 3.9–10.3)

## 2017-08-04 LAB — COMPREHENSIVE METABOLIC PANEL
ALBUMIN: 3.2 g/dL — AB (ref 3.5–5.0)
ALK PHOS: 51 U/L (ref 40–150)
ALT: 9 U/L (ref 0–55)
AST: 16 U/L (ref 5–34)
Anion Gap: 9 mEq/L (ref 3–11)
BILIRUBIN TOTAL: 2.31 mg/dL — AB (ref 0.20–1.20)
BUN: 16.3 mg/dL (ref 7.0–26.0)
CO2: 22 meq/L (ref 22–29)
CREATININE: 0.8 mg/dL (ref 0.6–1.1)
Calcium: 8.9 mg/dL (ref 8.4–10.4)
Chloride: 104 mEq/L (ref 98–109)
EGFR: >60 mL/min/{1.73_m2} (ref >60)
GLUCOSE: 93 mg/dL (ref 70–140)
Potassium: 5.3 mEq/L — ABNORMAL HIGH (ref 3.5–5.1)
SODIUM: 136 meq/L (ref 136–145)
TOTAL PROTEIN: 6.8 g/dL (ref 6.4–8.3)

## 2017-08-04 LAB — PREPARE RBC (CROSSMATCH)

## 2017-08-04 LAB — TECHNOLOGIST REVIEW: Technologist Review: 10

## 2017-08-04 LAB — LACTATE DEHYDROGENASE: LDH: 214 U/L (ref 125–245)

## 2017-08-04 LAB — ABO/RH: ABO/RH(D): A POS

## 2017-08-04 LAB — URIC ACID: Uric Acid, Serum: 6.2 mg/dl (ref 2.6–7.4)

## 2017-08-04 MED ORDER — ACYCLOVIR 200 MG PO CAPS: 400.0000 mg | ORAL_CAPSULE | Freq: Two times a day (BID) | ORAL | 0 refills | Status: DC

## 2017-08-04 MED ORDER — ALLOPURINOL 100 MG PO TABS: 100.0000 mg | ORAL_TABLET | Freq: Two times a day (BID) | ORAL | 0 refills | Status: DC

## 2017-08-04 MED ORDER — FAMOTIDINE IN NACL 20-0.9 MG/50ML-% IV SOLN
INTRAVENOUS | Status: AC
Start: 2017-08-04 — End: 2017-08-04
  Filled 2017-08-04: qty 50

## 2017-08-04 MED ORDER — FAMOTIDINE IN NACL 20-0.9 MG/50ML-% IV SOLN
20.0000 mg | Freq: Once | INTRAVENOUS | Status: AC | PRN
  Administered 2017-08-04: 20 mg via INTRAVENOUS

## 2017-08-04 MED ORDER — ACETAMINOPHEN 325 MG PO TABS
650.0000 mg | ORAL_TABLET | Freq: Once | ORAL | Status: AC
  Administered 2017-08-04: 650 mg via ORAL

## 2017-08-04 MED ORDER — MORPHINE SULFATE (PF) 4 MG/ML IV SOLN
INTRAVENOUS | Status: AC
  Filled 2017-08-04: qty 1

## 2017-08-04 MED ORDER — MORPHINE SULFATE 4 MG/ML IJ SOLN
2.0000 mg | Freq: Once | INTRAMUSCULAR | Status: AC
  Administered 2017-08-04: 2 mg via INTRAVENOUS
  Filled 2017-08-04: qty 1

## 2017-08-04 MED ORDER — DIPHENHYDRAMINE HCL 50 MG/ML IJ SOLN
50.0000 mg | Freq: Once | INTRAMUSCULAR | Status: AC
  Administered 2017-08-04: 50 mg via INTRAVENOUS

## 2017-08-04 MED ORDER — DEXAMETHASONE SODIUM PHOSPHATE 10 MG/ML IJ SOLN
10.0000 mg | Freq: Once | INTRAMUSCULAR | Status: AC
  Administered 2017-08-04: 10 mg via INTRAVENOUS

## 2017-08-04 MED ORDER — SODIUM CHLORIDE 0.9 % IV SOLN
Freq: Once | INTRAVENOUS | Status: AC
  Administered 2017-08-04: 12:00:00 via INTRAVENOUS

## 2017-08-04 MED ORDER — DIPHENHYDRAMINE HCL 50 MG/ML IJ SOLN
25.0000 mg | Freq: Once | INTRAMUSCULAR | Status: AC | PRN
  Administered 2017-08-04: 25 mg via INTRAVENOUS

## 2017-08-04 MED ORDER — SODIUM CHLORIDE 0.9 % IV SOLN
375.0000 mg/m2 | Freq: Once | INTRAVENOUS | Status: AC
  Administered 2017-08-04: 700 mg via INTRAVENOUS
  Filled 2017-08-04: qty 70

## 2017-08-04 MED ORDER — DIPHENHYDRAMINE HCL 50 MG/ML IJ SOLN
INTRAMUSCULAR | Status: AC
  Filled 2017-08-04: qty 1

## 2017-08-04 MED ORDER — ACETAMINOPHEN 325 MG PO TABS
ORAL_TABLET | ORAL | Status: AC
  Filled 2017-08-04: qty 2

## 2017-08-04 MED ORDER — FAMOTIDINE IN NACL 20-0.9 MG/50ML-% IV SOLN
20.0000 mg | Freq: Once | INTRAVENOUS | Status: AC
  Administered 2017-08-04: 20 mg via INTRAVENOUS

## 2017-08-04 MED ORDER — MONTELUKAST SODIUM 10 MG PO TABS
10.0000 mg | ORAL_TABLET | Freq: Once | ORAL | Status: AC
  Administered 2017-08-04: 10 mg via ORAL

## 2017-08-04 MED ORDER — METHYLPREDNISOLONE SODIUM SUCC 125 MG IJ SOLR
125.0000 mg | Freq: Once | INTRAMUSCULAR | Status: AC | PRN
  Administered 2017-08-04: 125 mg via INTRAVENOUS

## 2017-08-04 MED ORDER — DEXAMETHASONE SODIUM PHOSPHATE 10 MG/ML IJ SOLN
INTRAMUSCULAR | Status: AC
  Filled 2017-08-04: qty 1

## 2017-08-04 MED ORDER — MONTELUKAST SODIUM 10 MG PO TABS
ORAL_TABLET | ORAL | Status: AC
  Filled 2017-08-04: qty 1

## 2017-08-04 MED ORDER — SODIUM CHLORIDE 0.9 % IV SOLN: 10.0000 mg | Freq: Once | INTRAVENOUS | Status: DC

## 2017-08-04 MED FILL — ALLOPURINOL 100 MG TABS: 100 | 14 days supply | Qty: 28 | Fill #0

## 2017-08-04 MED FILL — ACYCLOVIR 200 MG CAP: 200 | 15 days supply | Qty: 60 | Fill #0

## 2017-08-04 NOTE — Progress Notes (Signed)
Per Dr. Irene Limbo, okay to treat pt with abnormal labs.  Hgb 7.5, PLT 56, and K 5.3.   1441- Rituxan infusing at 150 mg/hr (69 mL/Hr).  Pt complaint of severe bilateral leg pain/ cramping no other S/S.  Pt states it is the same pain she experienced with her first treatment and it was a reaction to the Rituxan.    1442- Pt VSS, Rituxan infusion stopped, NS wide open and Isabella Bowens, PA notified.  Orders received to administer 25 mg IV benadryl, 125 IV solumedrol, Pepcid IVPB, and 2 mg IV morphine.   1514- pt states bilateral leg pain has stopped.

## 2017-08-04 NOTE — Telephone Encounter (Signed)
Pt Hgb 7.5 today. Dr. Irene Limbo okay to tx, but pt scheduled for 1U PRBCs tomorrow at 0830 in infusion. Kim notified in phlebotomy that pt needs type and screen drawn and sent to Blood Bank. [facility] called via Admitting. Blood Bank notified of expected 1U PRBCs tomorrow. Orders placed for 1U PRBCs with premedication tylenol and benadryl. Pt RN, Merleen Nicely, notified so that she could communicate appointments, lab draw, and transfusion plan with the pt.

## 2017-08-04 NOTE — Telephone Encounter (Signed)
Spoke with Shanon Brow at [facility]. Prescriptions were received for allopurinol and acyclovir refills per Dr. Irene Limbo.

## 2017-08-04 NOTE — Progress Notes (Signed)
HEMATOLOGY/ONCOLOGY FOLLOW UP NOTE  Date of Service: 08/04/2017  Patient Care Team: Patient, No Pcp Per as PCP - General (General Practice)  CHIEF COMPLAINTS/PURPOSE OF INITIAL CONSULTATION:  Splenic marginal zone B-cell Non-Hodgkin's lymphoma   HISTORY OF PRESENTING ILLNESS:   Michelle Cohen is a wonderful 66 y.o. female who has been referred to [place] from [place] for evaluation and management of B-cell lymphoma. She presents to her appointment today with her partner of 21 years. She states that she moved to the area ~2 years ago secondary to her sister's diagnosis of lung cancer (unfortunately, now passed) and taking care of her mother's Alzheimer's. Generally, prior to this issue she has been very healthy and without chronic medical conditions and not on chronic medical therapies/treatments.   Initially, the patient was admitted to [place] on for abdominal pain and significant hernia. She reports that she was visiting in [place] and while flying there she had an acute onset of sweats and left lower quadrant pain. She reports that her hernia was not present at that time, but as she continued to fly her hernia had become more noticeable and enlarged in her lower abdomen. Additionally, her partner reports that her episode of hydrosis was very significant, stating that it was pooling below her. On their arrival to [place] on 06/21/17, a CT A/P was performed which showed an incarcerated hernia. Of note, her CT showed massive splenomegaly at 32cm in greatest dimension. Manual reduction of her hernia was attempted while in the ED but was unsuccessful. Pre-operative notes state that following intubation manual reduction was performed successfully and hernia was repaired laparoscopically. CT CAP was performed as well which was without the presence of additional surrounding LAD. She did have flow cytometry performed during her admission as well which showed B-cell lymphocytes  which were monoclonal. A bone marrow biopsy was also examined which showed that it was markedly hypercellular (~95%) with prominent interstitial infiltrate of small lymphocytes.   Prior to her diagnosis, she mostly felt typically well overall. She states that she did suffer from insomnia which she related to over-thinking at the time regarding her familial situation. She did experience some fatigue and weight loss with this as well. Her partner reports that she noticed her weight loss approximately 67moago, which she quantifies at approximately 30lbs since onset. Towards the end of this six month periods and just prior to the appearance of her hernia the patient began to notice some bothersome abdominal distension. She also noticed some significant fatigue and she would nap for longer periods following her daily workouts. Additionally, her partner would notice that she would have issues with breathing and this appeared irregular and more labored, especially at night while laying down. She also mentions that she did experience intermittent night sweats throughout this six month period as well. She denies lightheadedness, dizziness, abdominal pain, back pain, fever, chills, abnormal bruising/bleeding, blood stools, epistaxis, hematuria, gingival bleeding, or other bleeding issues within this period. She has not received any blood transfusions and has not previously had a tattoo; she is unconcerned with prior HepC exposure.   Since her hernia surgery, she reports that she feels great symptomatically overall and much improved. She does have some issues with abdominal bloating and discomfort, but overall this has been manageable. No overt abdominal pain.   Throughout all of this, she has attempted to try to remain within good spirits. Losing her sister recently has been hard, but she has been working  through it with her family; however, her partner voices that they do not have strong social support in this area as  most of their support is in [place] where they previously lived. She has been working through her sister's death with grief counseling which has been helping her.    She has never previously smoked, but she reports some moderate second-hand smoke through her father at a younger age. Her father did die of a PE, but she is unaware of any other diagnoses of clotting disorders or cancers within her family. No h/o alcohol abuse, chemical/radiation exposures. She worked previously in Industrial/product designer.   CURRENT TREATMENT:  Rituximab weekly x4 beginning on 07/21/17   INTERIM HISTORY:   [facility] returns today for scheduled follow up. In the interim she has been doing very well. She is accompanied by her partner of 21 years, [place]. Since we last met she has been having continued mouth sores. She was taking the acyclovir and this has been helping somewhat.   She does note some Michelle soreness throughout her abdomen and her lower back, however, she reports that she has been sitting and resting much more. Additionally, her and her partner report that she has been considerably more fatigued as well. Her Hgb has been at 7.5, I would recommend her to have a transfusion if she would like at this time. She denies any lightheadedness of shortness of breath associated with this. Additionally, her partner reports that she has had some issues with insomnia, loss of appetite, and decreased sensation of taste recently as well. [place] also states that she has anxious today on the way here. She has continued to have night sweats, however, these have been more intermittent recently. Additionally, she reports that she has a small red, raised, painful area to the right anterior shin. This has been worsening and more painful recently. Lip sores and mouth sores improved significantly. Decreasing lymphocytosis.  MEDICAL HISTORY:  No past medical history on file.  SURGICAL HISTORY: No past surgical history  on file.  SOCIAL HISTORY: Social History   Socioeconomic History  . Marital status: Single    Spouse name: Not on file  . Number of children: Not on file  . Years of education: Not on file  . Highest education level: Not on file  Social Needs  . Financial resource strain: Not on file  . Food insecurity - worry: Not on file  . Food insecurity - inability: Not on file  . Transportation needs - medical: Not on file  . Transportation needs - non-medical: Not on file  Occupational History  . Not on file  Tobacco Use  . Smoking status: Never Smoker  . Smokeless tobacco: Never Used  Substance and Sexual Activity  . Alcohol use: Yes    Comment: glass of wine occasionally  . Drug use: No  . Sexual activity: Not on file  Other Topics Concern  . Not on file  Social History Narrative  . Not on file    FAMILY HISTORY: Sister - lung cancer Mother -Alzheimers dementia  ALLERGIES:  has No Known Allergies.  MEDICATIONS:  Current Outpatient Medications  Medication Sig Dispense Refill  . acyclovir (ZOVIRAX) 200 MG capsule Take 2 capsules (400 mg total) 2 (two) times daily by mouth. 60 capsule 0  . allopurinol (ZYLOPRIM) 100 MG tablet Take 1 tablet (100 mg total) 2 (two) times daily for 14 days by mouth. 28 tablet 0  . docusate sodium (COLACE) 100 MG  capsule Take 100 mg by mouth 2 (two) times daily.    . folic acid (FOLVITE) 1 MG tablet Take 1 tablet (1 mg total) by mouth daily. 30 tablet 2  . LORazepam (ATIVAN) 0.5 MG tablet Take 1 tablet (0.5 mg total) by mouth every 8 (eight) hours as needed for anxiety. 30 tablet 0  . predniSONE (DELTASONE) 20 MG tablet Take 3 tablets (60 mg total) by mouth daily with breakfast. 21 tablet 0  . senna (SENOKOT) 8.6 MG TABS tablet Take 1 tablet by mouth.     No current facility-administered medications for this visit.     REVIEW OF SYSTEMS:    A 10+ POINT REVIEW OF SYSTEMS WAS OBTAINED including neurology, dermatology, psychiatry, cardiac,  respiratory, lymph, extremities, GI, GU, Musculoskeletal, constitutional, breasts, reproductive, HEENT.  All pertinent positives are noted in the HPI.  All others are negative.  PHYSICAL EXAMINATION:  ECOG PERFORMANCE STATUS: 1 - Symptomatic but completely ambulatory . Vitals:   08/04/17 1103  BP: 122/62  Pulse: 87  Resp: 20  SpO2: 99%   Filed Weights   08/04/17 1103  Weight: 142 lb 3.2 oz (64.5 kg)   .Body mass index is 22.95 kg/m.  GENERAL:alert, in no acute distress and comfortable SKIN: no acute rashes, no significant lesions EYES: conjunctiva are pink and non-injected, sclera anicteric OROPHARYNX: MMM, no exudates, no oropharyngeal erythema or ulceration NECK: supple, no JVD LYMPH:  no palpable lymphadenopathy in the cervical, axillary or inguinal regions LUNGS: clear to auscultation b/l with normal respiratory effort HEART: regular rate & rhythm ABDOMEN:  normoactive bowel sounds, non tender. Massive splenomegaly.  Extremity: resolved 1+ pedal edema bilaterally.   PSYCH: alert & oriented x 3 with fluent speech NEURO: no focal motor/sensory deficits  LABORATORY DATA:  I have reviewed the data as listed  . CBC Latest Ref Rng & Units 08/04/2017 07/28/2017 07/21/2017  WBC 3.9 - 10.3 10e3/uL 5.7 9.2 14.9(H)  Hemoglobin 11.6 - 15.9 g/dL 7.5(L) 8.5(L) 9.7(L)  Hematocrit 34.8 - 46.6 % 23.9(L) 27.0(L) 31.1(L)  Platelets 145 - 400 10e3/uL 56(L) 48(L) 65(L)   . CBC    Component Value Date/Time   WBC 5.7 08/04/2017 1048   RBC 2.74 (L) 08/04/2017 1048   HGB 7.5 (L) 08/04/2017 1048   HCT 23.9 (L) 08/04/2017 1048   PLT 56 (L) 08/04/2017 1048   MCV 87.2 08/04/2017 1048   MCH 27.4 08/04/2017 1048   MCHC 31.4 (L) 08/04/2017 1048   RDW 18.7 (H) 08/04/2017 1048   LYMPHSABS 4.2 (H) 08/04/2017 1048   MONOABS 0.3 08/04/2017 1048   EOSABS 0.0 08/04/2017 1048   BASOSABS 0.0 08/04/2017 1048    . CMP Latest Ref Rng & Units 08/04/2017 07/28/2017 07/21/2017  Glucose 70 - 140 mg/dl  93 93 134  BUN 7.0 - 26.0 mg/dL 16.3 16.7 26.0  Creatinine 0.6 - 1.1 mg/dL 0.8 0.8 0.9  Sodium 136 - 145 mEq/L 136 136 137  Potassium 3.5 - 5.1 mEq/L 5.3(H) 4.2 4.1  CO2 22 - 29 mEq/L 22 21(L) 21(L)  Calcium 8.4 - 10.4 mg/dL 8.9 8.7 8.5  Total Protein 6.4 - 8.3 g/dL 6.8 6.9 6.6  Total Bilirubin 0.20 - 1.20 mg/dL 2.31(H) 2.33(H) 2.07(H)  Alkaline Phos 40 - 150 U/L 51 44 47  AST 5 - 34 U/L '16 11 10  ' ALT 0 - 55 U/L 9 8 <6   Component     Latest Ref Rng & Units 07/13/2017  LDH     125 - 245  U/L 232  Hep C Virus Ab     0.0 - 0.9 s/co ratio <0.1  HIV Screen 4th Generation wRfx     Non Reactive Non Reactive  Hep B Core Ab, Tot     Negative Negative  Hepatitis B Surface Ag     Negative Negative   Component     Latest Ref Rng & Units 07/28/2017  LDH     125 - 245 U/L 200  Uric Acid, Serum     2.6 - 7.4 mg/dl 6.0  Phosphorus     2.5 - 4.5 mg/dL 3.8      RADIOGRAPHIC STUDIES: I have personally reviewed the radiological images as listed and agreed with the findings in the report. Nm Pet Image Initial (pi) Skull Base To Thigh  Result Date: 07/25/2017 CLINICAL DATA:  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 EXAM: NUCLEAR MEDICINE PET SKULL BASE TO THIGH TECHNIQUE: 6.9 mCi F-18 FDG was injected intravenously. Full-ring PET imaging was performed from the skull base to thigh after the radiotracer. CT data was obtained and used for attenuation correction and anatomic localization. FASTING BLOOD GLUCOSE:  Value: 114 mg/dl COMPARISON:  None. FINDINGS: NECK No hypermetabolic lymph nodes in the neck. CHEST No hypermetabolic mediastinal or hilar nodes. No suspicious pulmonary nodules on the CT scan. Atherosclerotic calcification of the aortic arch. Mild cardiomegaly. Low-density blood pool. Background mediastinal blood pool activity 2.0. ABDOMEN/PELVIS The spleen measures 24.2 by 14.4 by 31.6 cm (volume = 5770 cm^3). Both splenic density and metabolic activity are  fairly homogeneous, with maximum SUV of a large representative portion of the spleen at 3.2. Mildly prominent vasculature along the splenic hilum. Surrounding structures are displaced by the massively enlarged spleen. No appreciable hypermetabolic adenopathy. Physiologic uptake in bowel. Small left groin hernia contains adipose tissue and a small amount of fluid. Sigmoid diverticulosis without active diverticulitis. Background hepatic uptake level 3.2. SKELETON No focal hypermetabolic activity to suggest skeletal metastasis. IMPRESSION: 1. Massive splenomegaly, splenic volume 5770 cubic cm, with homogeneous low grade activity throughout the spleen equal to that of the physiologic activity in the liver, and above the background blood pool activity in the mediastinum. No pathologic adenopathy identified. No bony involvement noted. 2. Other imaging findings of potential clinical significance: Aortic Atherosclerosis (ICD10-I70.0). Mild cardiomegaly. Low-density blood pool suggesting anemia. Sigmoid diverticulosis. Small left groin hernia containing adipose tissue and a small amount of fluid. Electronically Signed   By: Van Clines M.D.   On: 07/25/2017 09:28   ASSESSMENT & PLAN:   [facility] is a wonderful 66 y.o. caucasian female who presents to our clinic to discuss ongoing management of the following:   1. 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  Her clinical presentation is also representative of this and that this may have been present over months to possibly years.  -With her pattern of involvement and results thus far it is likely that she has splenic marginal zone type lymphoma.  -PET  scan on 07/25/2017 with results showing: Massive splenomegaly, splenic volume 5770 cubic cm, with homogeneous low grade activity throughout the spleen equal to that of the physiologic activity in the liver, and above the background blood pool activity in the mediastinum.  No pathologic adenopathy identified. No bony involvement noted.   2. 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  -Hgb at 9.7 as of 07/21/2017  -PLT at 48k as of 07/28/2017 Hgb at 8.5 as of 07/28/2017 -Count improved with short prednisone course prior to treatment, will hopefully continue to improve throughout treatment. We will monitor.  -She has been having significant issues with fatigue and overall sluggish feeling, her Hgb has continued to drop to 7.5  have recommended her to receive blood transfusion and she would like to proceed with this.  -she has been set up for her blood transfusion for tomorrow. 1 unit  3. Rituxan hypersensitivity reaction - infusion rate related Had some muscle cramping and mild SOB needing additional steroids, benadryl and pepacid and Albuterol. Had to complete Rituxan at lower rate. Patient would not be a candidate for rapid infusion protocol and would need to keep the next infusion at the tolerate rate and not rate escalate. -added pepcid, steroids and singulair to premedications. -Advised the patient to use anti-histamine, Zyrtec or Claritin in between treatments   Plan -Labs stable. No treatment-related prohibitive toxicity. -minimal tumor lysis with minimal hyperkalemia and slight increase in bilirubin. -Advised to continue to increase her water intake to help with this as well (at least 48-64 oz daily). We will refill her Allopurinol. I have also advised her to avoid K containing  -Patient appropriate to proceed with her third cycle of Rituxan  with adjusted premedications. She tolerated the Rituxan 2nd dose better and was able to gradually dose escalate without significant toxicities.  4. Oral mucositis/ HSV outbreak resolving -will prescribe with acyclovir for prophylaxis  5. Small carbuncle to the right anterior lower leg -I have recommended her to begin a topical OTC antibiotic   PRBC transfusion 1 unit tomorrow F/u in a weekl with labs/clinic and 4th dose of Rituxan\  All of the patients questions were answered with apparent satisfaction. The patient knows to call the clinic with any problems, questions or concerns.  I spent 25 minutes counseling the patient face to face. The total time spent in the appointment was 30 minutes and more than 50% was on counseling and direct patient cares.  Sullivan Lone MD [place] AAHIVMS [facility] Hematology/Oncology Physician [facility]  (Office):       [phone] (Work cell):  [phone] (Fax):           [phone]  08/04/2017 11:21 AM  This document serves as a record of services personally performed by Sullivan Lone, MD. It was created on his behalf by Reola Mosher, a trained medical scribe. The creation of this record is based on the scribe's personal observations and the provider's statements to them.   .I have reviewed the above documentation for accuracy and completeness, and I agree with the above. Brunetta Genera MD

## 2017-08-04 NOTE — Telephone Encounter (Signed)
Per 11/2 los - all appointments have been scheduled at check out.

## 2017-08-04 NOTE — Patient Instructions (Signed)
[facility] Discharge Instructions for Patients Receiving Chemotherapy  Today you received the following chemotherapy agents:  Rituxan.  To help prevent nausea and vomiting after your treatment, we encourage you to take your nausea medication as directed.   If you develop nausea and vomiting that is not controlled by your nausea medication, call the clinic.   BELOW ARE SYMPTOMS THAT SHOULD BE REPORTED IMMEDIATELY:  *FEVER GREATER THAN 100.5 F  *CHILLS WITH OR WITHOUT FEVER  NAUSEA AND VOMITING THAT IS NOT CONTROLLED WITH YOUR NAUSEA MEDICATION  *UNUSUAL SHORTNESS OF BREATH  *UNUSUAL BRUISING OR BLEEDING  TENDERNESS IN MOUTH AND THROAT WITH OR WITHOUT PRESENCE OF ULCERS  *URINARY PROBLEMS  *BOWEL PROBLEMS  UNUSUAL RASH Items with * indicate a potential emergency and should be followed up as soon as possible.  Feel free to call the clinic should you have any questions or concerns. The clinic phone number is [phone].  Please show the CHEMO ALERT CARD at check-in to the Emergency Department and triage nurse.   

## 2017-08-05 ENCOUNTER — Ambulatory Visit: Payer: Self-pay

## 2017-08-05 DIAGNOSIS — D649 Anemia, unspecified: Secondary | ICD-10-CM

## 2017-08-05 MED ORDER — DIPHENHYDRAMINE HCL 25 MG PO CAPS
ORAL_CAPSULE | ORAL | Status: AC
  Filled 2017-08-05: qty 1

## 2017-08-05 MED ORDER — SODIUM CHLORIDE 0.9 % IV SOLN
250.0000 mL | Freq: Once | INTRAVENOUS | Status: AC
  Administered 2017-08-05: 500 mL via INTRAVENOUS

## 2017-08-05 MED ORDER — DIPHENHYDRAMINE HCL 25 MG PO CAPS
25.0000 mg | ORAL_CAPSULE | Freq: Once | ORAL | Status: AC
  Administered 2017-08-05: 25 mg via ORAL

## 2017-08-05 MED ORDER — ACETAMINOPHEN 325 MG PO TABS
ORAL_TABLET | ORAL | Status: AC
  Filled 2017-08-05: qty 2

## 2017-08-05 MED ORDER — ACETAMINOPHEN 325 MG PO TABS
650.0000 mg | ORAL_TABLET | Freq: Once | ORAL | Status: AC
  Administered 2017-08-05: 650 mg via ORAL

## 2017-08-05 NOTE — Patient Instructions (Signed)

## 2017-08-05 NOTE — Progress Notes (Signed)
1529- pt bilateral leg pain has resolved.  Orders received from [facility] to restart infusion at previous rate of 150 mg/hr (69 mL/hr).    Pt tolerated rest of infusion with max rate of 300 mg/ hr (137 mL/hr).

## 2017-08-07 ENCOUNTER — Other Ambulatory Visit: Payer: Self-pay | Admitting: *Deleted

## 2017-08-07 LAB — TYPE AND SCREEN
ABO/RH(D): A POS
Antibody Screen: NEGATIVE
Unit division: 0

## 2017-08-07 LAB — BPAM RBC
Blood Product Expiration Date: 201811272359
ISSUE DATE / TIME: 201811100837
Unit Type and Rh: 6200

## 2017-08-07 NOTE — Progress Notes (Signed)
[facility] Progress Note   Michelle Cohen [id number] 1951-07-22 66 y.o.  Michelle Cohen is managed by Dr. Sullivan Lone  Actively treated with chemotherapy: yes  Current Therapy: Rituximab  Last Treated: 08/04/2017  Assessment: Plan:    Adverse effect of chemotherapy, initial encounter  Michelle Cohen was seen in the infusion room for a suspected chemotherapy reaction. She was receiving rituximab at the time of her reaction. Her symptoms included: Severe muscle cramps of the posterior thighs She was premedicated with Tylenol prior to starting chemotherapy. Rituxan was paused and Michelle Cohen was given IV Solu-Medrol, IV Decadron, IV Benadryl, IV morphine for pain and Tylenol for temperature spike of 100.1 after onset of her symptoms. Michelle Cohen did  respond to intervention.  Her muscle cramps subsided.  She was able to finish her infusion of rituximab.  Please see After Visit Summary for patient specific instructions.  Future Appointments  Date Time Provider [place]  08/11/2017  8:30 AM [facility] LAB 1 [facility] None  08/11/2017  9:00 AM Brunetta Genera, MD [facility] None  08/11/2017 10:00 AM [facility] E14 [facility] None    No orders of the defined types were placed in this encounter.      Subjective:   Patient ID:  Michelle Cohen is a 66 y.o. (DOB 1951-01-10) female.  Chief Complaint: No chief complaint on file.   HPI Michelle Cohen was seen in the infusion room for a suspected reaction to chemotherapy. She was receiving rituximab at the time of her reaction. Her symptoms included: Severe muscle cramps of the posterior thighs. She was premedicated with Tylenol prior to starting chemotherapy. Rituxan was paused and Michelle Cohen was given IV Solu-Medrol, IV Decadron, IV Benadryl, IV morphine for pain and Tylenol for temperature spike of 100.1 after onset of her symptoms. Michelle Cohen did  respond to intervention.  Her  muscle cramps subsided.  She was able to finish her infusion of rituximab.  Medications: I have reviewed the patient's current medications.  Allergies: No Known Allergies  No past medical history on file.  No past surgical history on file.  No family history on file.  Social History   Socioeconomic History  . Marital status: Single    Spouse name: Not on file  . Number of children: Not on file  . Years of education: Not on file  . Highest education level: Not on file  Social Needs  . Financial resource strain: Not on file  . Food insecurity - worry: Not on file  . Food insecurity - inability: Not on file  . Transportation needs - medical: Not on file  . Transportation needs - non-medical: Not on file  Occupational History  . Not on file  Tobacco Use  . Smoking status: Never Smoker  . Smokeless tobacco: Never Used  Substance and Sexual Activity  . Alcohol use: Yes    Comment: glass of wine occasionally  . Drug use: No  . Sexual activity: Not on file  Other Topics Concern  . Not on file  Social History Narrative  . Not on file    Past Medical History, Surgical history, Social history, and Family history were reviewed and updated as appropriate.   Please see review of systems for further details on the patient's review from today.   Review of Systems:  Review of Systems  Constitutional: Negative for chills, diaphoresis and fever.  Respiratory: Negative for chest tightness and shortness of breath.   Cardiovascular: Negative for chest pain and leg  swelling.  Musculoskeletal: Positive for myalgias (Severe muscle cramps of the posterior thighs.).    Objective:   Physical Exam:  There were no vitals taken for this visit. ECOG: 0  Physical Exam  Constitutional: She appears distressed.  HENT:  Head: Normocephalic and atraumatic.  Cardiovascular: Normal rate, regular rhythm and normal heart sounds. Exam reveals no gallop and no friction rub.  No murmur  heard. Pulmonary/Chest: Effort normal and breath sounds normal. No respiratory distress. She has no wheezes. She has no rales.  Neurological: She is alert.  Skin: Skin is warm and dry. She is not diaphoretic.    Lab Review:     Component Value Date/Time   NA 136 08/04/2017 1048   K 5.3 (H) 08/04/2017 1048   CO2 22 08/04/2017 1048   GLUCOSE 93 08/04/2017 1048   BUN 16.3 08/04/2017 1048   CREATININE 0.8 08/04/2017 1048   CALCIUM 8.9 08/04/2017 1048   PROT 6.8 08/04/2017 1048   ALBUMIN 3.2 (L) 08/04/2017 1048   AST 16 08/04/2017 1048   ALT 9 08/04/2017 1048   ALKPHOS 51 08/04/2017 1048   BILITOT 2.31 (H) 08/04/2017 1048       Component Value Date/Time   WBC 5.7 08/04/2017 1048   RBC 2.74 (L) 08/04/2017 1048   HGB 7.5 (L) 08/04/2017 1048   HCT 23.9 (L) 08/04/2017 1048   PLT 56 (L) 08/04/2017 1048   MCV 87.2 08/04/2017 1048   MCH 27.4 08/04/2017 1048   MCHC 31.4 (L) 08/04/2017 1048   RDW 18.7 (H) 08/04/2017 1048   LYMPHSABS 4.2 (H) 08/04/2017 1048   MONOABS 0.3 08/04/2017 1048   EOSABS 0.0 08/04/2017 1048   BASOSABS 0.0 08/04/2017 1048   -------------------------------  Imaging from last 24 hours (if applicable):  Radiology interpretation: Nm Pet Image Initial (pi) Skull Base To Thigh  Result Date: 07/25/2017 CLINICAL DATA:  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 EXAM: NUCLEAR MEDICINE PET SKULL BASE TO THIGH TECHNIQUE: 6.9 mCi F-18 FDG was injected intravenously. Full-ring PET imaging was performed from the skull base to thigh after the radiotracer. CT data was obtained and used for attenuation correction and anatomic localization. FASTING BLOOD GLUCOSE:  Value: 114 mg/dl COMPARISON:  None. FINDINGS: NECK No hypermetabolic lymph nodes in the neck. CHEST No hypermetabolic mediastinal or hilar nodes. No suspicious pulmonary nodules on the CT scan. Atherosclerotic calcification of the aortic arch. Mild cardiomegaly. Low-density blood pool.  Background mediastinal blood pool activity 2.0. ABDOMEN/PELVIS The spleen measures 24.2 by 14.4 by 31.6 cm (volume = 5770 cm^3). Both splenic density and metabolic activity are fairly homogeneous, with maximum SUV of a large representative portion of the spleen at 3.2. Mildly prominent vasculature along the splenic hilum. Surrounding structures are displaced by the massively enlarged spleen. No appreciable hypermetabolic adenopathy. Physiologic uptake in bowel. Small left groin hernia contains adipose tissue and a small amount of fluid. Sigmoid diverticulosis without active diverticulitis. Background hepatic uptake level 3.2. SKELETON No focal hypermetabolic activity to suggest skeletal metastasis. IMPRESSION: 1. Massive splenomegaly, splenic volume 5770 cubic cm, with homogeneous low grade activity throughout the spleen equal to that of the physiologic activity in the liver, and above the background blood pool activity in the mediastinum. No pathologic adenopathy identified. No bony involvement noted. 2. Other imaging findings of potential clinical significance: Aortic Atherosclerosis (ICD10-I70.0). Mild cardiomegaly. Low-density blood pool suggesting anemia. Sigmoid diverticulosis. Small left groin hernia containing adipose tissue and a small amount of fluid. Electronically Signed   By:  Van Clines M.D.   On: 07/25/2017 09:28

## 2017-08-08 ENCOUNTER — Other Ambulatory Visit: Payer: Self-pay | Admitting: *Deleted

## 2017-08-08 MED ORDER — MAGIC MOUTHWASH W/LIDOCAINE: 5.0000 mL | Freq: Four times a day (QID) | ORAL | 0 refills | Status: DC | PRN

## 2017-08-08 MED FILL — MAGIC MW LIDO/MYL/NYS 1:1:1: 6 days supply | Qty: 120 | Fill #0

## 2017-08-08 NOTE — Telephone Encounter (Signed)
error 

## 2017-08-10 ENCOUNTER — Other Ambulatory Visit: Payer: Self-pay | Admitting: *Deleted

## 2017-08-10 DIAGNOSIS — C8307 Small cell B-cell lymphoma, spleen: Secondary | ICD-10-CM

## 2017-08-11 ENCOUNTER — Encounter: Payer: Self-pay | Admitting: Hematology

## 2017-08-11 ENCOUNTER — Ambulatory Visit (HOSPITAL_BASED_OUTPATIENT_CLINIC_OR_DEPARTMENT_OTHER): Payer: Self-pay

## 2017-08-11 ENCOUNTER — Ambulatory Visit (HOSPITAL_BASED_OUTPATIENT_CLINIC_OR_DEPARTMENT_OTHER): Payer: Self-pay | Admitting: Hematology

## 2017-08-11 ENCOUNTER — Other Ambulatory Visit (HOSPITAL_BASED_OUTPATIENT_CLINIC_OR_DEPARTMENT_OTHER): Payer: Self-pay

## 2017-08-11 VITALS — BP 118/68 | HR 124 | Temp 98.5°F | Resp 18

## 2017-08-11 VITALS — BP 113/60 | HR 88 | Temp 99.2°F | Resp 18 | Ht 66.0 in | Wt 141.9 lb

## 2017-08-11 DIAGNOSIS — D61818 Other pancytopenia: Secondary | ICD-10-CM

## 2017-08-11 DIAGNOSIS — C8307 Small cell B-cell lymphoma, spleen: Secondary | ICD-10-CM

## 2017-08-11 DIAGNOSIS — D649 Anemia, unspecified: Secondary | ICD-10-CM

## 2017-08-11 DIAGNOSIS — R109 Unspecified abdominal pain: Secondary | ICD-10-CM

## 2017-08-11 DIAGNOSIS — D731 Hypersplenism: Secondary | ICD-10-CM

## 2017-08-11 DIAGNOSIS — Z7189 Other specified counseling: Secondary | ICD-10-CM

## 2017-08-11 DIAGNOSIS — K1230 Oral mucositis (ulcerative), unspecified: Secondary | ICD-10-CM

## 2017-08-11 DIAGNOSIS — Z5112 Encounter for antineoplastic immunotherapy: Secondary | ICD-10-CM

## 2017-08-11 DIAGNOSIS — M545 Low back pain: Secondary | ICD-10-CM

## 2017-08-11 DIAGNOSIS — D72829 Elevated white blood cell count, unspecified: Secondary | ICD-10-CM

## 2017-08-11 LAB — CBC WITH DIFFERENTIAL/PLATELET
BASO%: 0.2 % (ref 0.0–2.0)
Basophils Absolute: 0 10*3/uL (ref 0.0–0.1)
EOS ABS: 0 10*3/uL (ref 0.0–0.5)
EOS%: 0.6 % (ref 0.0–7.0)
HEMATOCRIT: 22.7 % — AB (ref 34.8–46.6)
HEMOGLOBIN: 7.1 g/dL — AB (ref 11.6–15.9)
LYMPH#: 3.4 10*3/uL — AB (ref 0.9–3.3)
LYMPH%: 68.2 % — AB (ref 14.0–49.7)
MCH: 27.4 pg (ref 25.1–34.0)
MCHC: 31.3 g/dL — AB (ref 31.5–36.0)
MCV: 87.6 fL (ref 79.5–101.0)
MONO#: 0.5 10*3/uL (ref 0.1–0.9)
MONO%: 9.5 % (ref 0.0–14.0)
NEUT#: 1.1 10*3/uL — ABNORMAL LOW (ref 1.5–6.5)
NEUT%: 21.5 % — AB (ref 38.4–76.8)
PLATELETS: 57 10*3/uL — AB (ref 145–400)
RBC: 2.59 10*6/uL — ABNORMAL LOW (ref 3.70–5.45)
RDW: 19.4 % — ABNORMAL HIGH (ref 11.2–14.5)
WBC: 5 10*3/uL (ref 3.9–10.3)
nRBC: 0 % (ref 0–0)

## 2017-08-11 LAB — COMPREHENSIVE METABOLIC PANEL
ALBUMIN: 3.2 g/dL — AB (ref 3.5–5.0)
ALK PHOS: 50 U/L (ref 40–150)
ALT: 9 U/L (ref 0–55)
ANION GAP: 11 meq/L (ref 3–11)
AST: 13 U/L (ref 5–34)
BILIRUBIN TOTAL: 2.61 mg/dL — AB (ref 0.20–1.20)
BUN: 18.1 mg/dL (ref 7.0–26.0)
CALCIUM: 8.8 mg/dL (ref 8.4–10.4)
CHLORIDE: 104 meq/L (ref 98–109)
CO2: 21 mEq/L — ABNORMAL LOW (ref 22–29)
CREATININE: 0.9 mg/dL (ref 0.6–1.1)
EGFR: >60 mL/min/{1.73_m2} (ref >60)
Glucose: 96 mg/dl (ref 70–140)
Potassium: 4.2 mEq/L (ref 3.5–5.1)
Sodium: 136 mEq/L (ref 136–145)
Total Protein: 6.8 g/dL (ref 6.4–8.3)

## 2017-08-11 LAB — PREPARE RBC (CROSSMATCH)

## 2017-08-11 LAB — TECHNOLOGIST REVIEW

## 2017-08-11 MED ORDER — MONTELUKAST SODIUM 10 MG PO TABS
ORAL_TABLET | ORAL | Status: AC
Start: 2017-08-11 — End: 2017-08-11
  Filled 2017-08-11: qty 1

## 2017-08-11 MED ORDER — DIPHENHYDRAMINE HCL 50 MG/ML IJ SOLN
50.0000 mg | Freq: Once | INTRAMUSCULAR | Status: AC
  Administered 2017-08-11: 50 mg via INTRAVENOUS

## 2017-08-11 MED ORDER — METHYLPREDNISOLONE SODIUM SUCC 125 MG IJ SOLR
125.0000 mg | Freq: Once | INTRAMUSCULAR | Status: AC
  Administered 2017-08-11: 125 mg via INTRAVENOUS

## 2017-08-11 MED ORDER — SODIUM CHLORIDE 0.9 % IV SOLN
375.0000 mg/m2 | Freq: Once | INTRAVENOUS | Status: AC
  Administered 2017-08-11: 700 mg via INTRAVENOUS
  Filled 2017-08-11: qty 50

## 2017-08-11 MED ORDER — LORAZEPAM 1 MG PO TABS
ORAL_TABLET | ORAL | Status: AC
  Filled 2017-08-11: qty 1

## 2017-08-11 MED ORDER — LORAZEPAM 2 MG/ML IJ SOLN
INTRAMUSCULAR | Status: AC
Start: 2017-08-11 — End: 2017-08-11
  Filled 2017-08-11: qty 1

## 2017-08-11 MED ORDER — FAMOTIDINE IN NACL 20-0.9 MG/50ML-% IV SOLN
20.0000 mg | Freq: Once | INTRAVENOUS | Status: AC
  Administered 2017-08-11: 20 mg via INTRAVENOUS

## 2017-08-11 MED ORDER — MONTELUKAST SODIUM 10 MG PO TABS
10.0000 mg | ORAL_TABLET | Freq: Once | ORAL | Status: AC
  Administered 2017-08-11: 10 mg via ORAL

## 2017-08-11 MED ORDER — DIPHENHYDRAMINE HCL 50 MG/ML IJ SOLN
INTRAMUSCULAR | Status: AC
  Filled 2017-08-11: qty 1

## 2017-08-11 MED ORDER — ACETAMINOPHEN 325 MG PO TABS
650.0000 mg | ORAL_TABLET | Freq: Once | ORAL | Status: AC
  Administered 2017-08-11: 650 mg via ORAL

## 2017-08-11 MED ORDER — METHYLPREDNISOLONE SODIUM SUCC 125 MG IJ SOLR
INTRAMUSCULAR | Status: AC
  Filled 2017-08-11: qty 2

## 2017-08-11 MED ORDER — FAMOTIDINE IN NACL 20-0.9 MG/50ML-% IV SOLN
INTRAVENOUS | Status: AC
  Filled 2017-08-11: qty 50

## 2017-08-11 MED ORDER — ACETAMINOPHEN 325 MG PO TABS
ORAL_TABLET | ORAL | Status: AC
  Filled 2017-08-11: qty 2

## 2017-08-11 MED ORDER — SODIUM CHLORIDE 0.9 % IV SOLN
Freq: Once | INTRAVENOUS | Status: AC
  Administered 2017-08-11: 11:00:00 via INTRAVENOUS

## 2017-08-11 MED ORDER — LORAZEPAM 1 MG PO TABS
0.5000 mg | ORAL_TABLET | Freq: Once | ORAL | Status: AC
  Administered 2017-08-11: 0.5 mg via ORAL

## 2017-08-11 NOTE — Patient Instructions (Signed)
[facility] Discharge Instructions for Patients Receiving Chemotherapy  Today you received the following chemotherapy agent: Rituxan   To help prevent nausea and vomiting after your treatment, we encourage you to take your nausea medication as directed.    If you develop nausea and vomiting that is not controlled by your nausea medication, call the clinic.   BELOW ARE SYMPTOMS THAT SHOULD BE REPORTED IMMEDIATELY:  *FEVER GREATER THAN 100.5 F  *CHILLS WITH OR WITHOUT FEVER  NAUSEA AND VOMITING THAT IS NOT CONTROLLED WITH YOUR NAUSEA MEDICATION  *UNUSUAL SHORTNESS OF BREATH  *UNUSUAL BRUISING OR BLEEDING  TENDERNESS IN MOUTH AND THROAT WITH OR WITHOUT PRESENCE OF ULCERS  *URINARY PROBLEMS  *BOWEL PROBLEMS  UNUSUAL RASH Items with * indicate a potential emergency and should be followed up as soon as possible.  Feel free to call the clinic should you have any questions or concerns. The clinic phone number is [phone].  Please show the CHEMO ALERT CARD at check-in to the Emergency Department and triage nurse.   

## 2017-08-11 NOTE — Progress Notes (Signed)
HR taken at end of Rituxan tx is 118-124.  Pt denies any weakness or complaints.  MD Magrinat at chairside to assess pt.  Pt aware to further inc oral intake at home and advised of neutropenic precautions.  Pt and partner at chairside both verbalized understanding.  A&Ox4, ambulatory w/steady gait.

## 2017-08-11 NOTE — Progress Notes (Signed)
HEMATOLOGY/ONCOLOGY FOLLOW UP NOTE  Date of Service: 08/11/2017  Patient Care Team: Patient, No Pcp Per as PCP - General (General Practice)  CHIEF COMPLAINTS/PURPOSE OF INITIAL CONSULTATION:  Splenic marginal zone B-cell Non-Hodgkin's lymphoma   HISTORY OF PRESENTING ILLNESS:   Michelle Cohen is a wonderful 66 y.o. female who has been [facility] [place] [facility] and management of B-cell lymphoma. She presents to her appointment today with her partner of 21 years. She states that she moved to the area ~2 years ago secondary to her sister's diagnosis of lung cancer (unfortunately, now passed) and taking care of her mother's Alzheimer's. Generally, prior to this issue she has been very healthy and without chronic medical conditions and not on chronic medical therapies/treatments.   Initially, the patient was admitted to [facility] on for abdominal pain and significant hernia. She reports that she was visiting in [place] and while flying there she had an acute onset of sweats and left lower quadrant pain. She reports that her hernia was not present at that time, but as she continued to fly her hernia had become more noticeable and enlarged in her lower abdomen. Additionally, her partner reports that her episode of hydrosis was very significant, stating that it was pooling below her. On their arrival to [facility] on 06/21/17, a CT A/P was performed which showed an incarcerated hernia. Of note, her CT showed massive splenomegaly at 32cm in greatest dimension. Manual reduction of her hernia was attempted while in the ED but was unsuccessful. Pre-operative notes state that following intubation manual reduction was performed successfully and hernia was repaired laparoscopically. CT CAP was performed as well which was without the presence of additional surrounding LAD. She did have flow cytometry performed during her admission as well which showed B-cell lymphocytes  which were monoclonal. A bone marrow biopsy was also examined which showed that it was markedly hypercellular (~95%) with prominent interstitial infiltrate of small lymphocytes.   Prior to her diagnosis, she mostly felt typically well overall. She states that she did suffer from insomnia which she related to over-thinking at the time regarding her familial situation. She did experience some fatigue and weight loss with this as well. Her partner reports that she noticed her weight loss approximately 73moago, which she quantifies at approximately 30lbs since onset. Towards the end of this six month periods and just prior to the appearance of her hernia the patient began to notice some bothersome abdominal distension. She also noticed some significant fatigue and she would nap for longer periods following her daily workouts. Additionally, her partner would notice that she would have issues with breathing and this appeared irregular and more labored, especially at night while laying down. She also mentions that she did experience intermittent night sweats throughout this six month period as well. She denies lightheadedness, dizziness, abdominal pain, back pain, fever, chills, abnormal bruising/bleeding, blood stools, epistaxis, hematuria, gingival bleeding, or other bleeding issues within this period. She has not received any blood transfusions and has not previously had a tattoo; she is unconcerned with prior HepC exposure.   Since her hernia surgery, she reports that she feels great symptomatically overall and much improved. She does have some issues with abdominal bloating and discomfort, but overall this has been manageable. No overt abdominal pain.   Throughout all of this, she has attempted to try to remain within good spirits. Losing her sister recently has been hard, but she has been working  through it with her family; however, her partner voices that they do not have strong social support in this area as  most of their support is in [place] where they previously lived. She has been working through her sister's death with grief counseling which has been helping her.    She has never previously smoked, but she reports some moderate second-hand smoke through her father at a younger age. Her father did die of a PE, but she is unaware of any other diagnoses of clotting disorders or cancers within her family. No h/o alcohol abuse, chemical/radiation exposures. She worked previously in Industrial/product designer.   CURRENT TREATMENT:  Rituximab weekly x4 beginning on 07/21/17   INTERIM HISTORY:   Michelle Cohen returns today for scheduled follow up. In the interim she has been doing relatively well. She is accompanied by her partner of 21 years, Michelle Cohen. Last time she was seen her Hgb was 7.5. She was given 1u PRBC, however, her Hgb has dropped again to 7.1. Likely sequestration. She reports that her last infusion she did develop an infusion reaction mainly involving bilateral posterior leg cramping. She denies any other areas of pain associated with this. She reports that this has happened with every infusion, approximately 1.5 hours into infusion, but this episode was the worst it has ever been. She was given IV Solumedrol, Benadryl, and Morphine by PA Sandi Mealy and her symptoms improved considerably. We will add Ativan prior to her infusion today to help prevent her from infusion reaction today. She does report that she does feel more distended and tighter in the abdomen this week. She denies any issues with constipation. She is still eating frequently in small amounts, but she does report that her appetite has been good recently. She reports that following her blood transfusion that she was feeling considerably better, however, her energy decreased again throughout the weekend. Her Hgb dropped again, but she denies any issues with pain, hematuria, hematochezia, epistaxis, other sources of bleeding, or  any other associated symptoms. Her mouth sores have continued to improve. She has been able to continue to pass flatus and eructate without issue. She continues to have lower back and abdominal discomfort, however, she denies any overt pain within the abdomen.   On review of systems, pt denies fever, chills, rash, weight loss, urinary complaints. Denies pain. Pt denies abdominal pain, nausea, vomiting. Positive for that in the above HPI.   MEDICAL HISTORY:  No past medical history on file.  SURGICAL HISTORY: No past surgical history on file.  SOCIAL HISTORY: Social History   Socioeconomic History  . Marital status: Single    Spouse name: Not on file  . Number of children: Not on file  . Years of education: Not on file  . Highest education level: Not on file  Social Needs  . Financial resource strain: Not on file  . Food insecurity - worry: Not on file  . Food insecurity - inability: Not on file  . Transportation needs - medical: Not on file  . Transportation needs - non-medical: Not on file  Occupational History  . Not on file  Tobacco Use  . Smoking status: Never Smoker  . Smokeless tobacco: Never Used  Substance and Sexual Activity  . Alcohol use: Yes    Comment: glass of wine occasionally  . Drug use: No  . Sexual activity: Not on file  Other Topics Concern  . Not on file  Social History Narrative  . Not on  file    FAMILY HISTORY: Sister - lung cancer Mother -Alzheimers dementia  ALLERGIES:  has No Known Allergies.  MEDICATIONS:  Current Outpatient Medications  Medication Sig Dispense Refill  . acyclovir (ZOVIRAX) 200 MG capsule Take 2 capsules (400 mg total) 2 (two) times daily by mouth. 60 capsule 0  . allopurinol (ZYLOPRIM) 100 MG tablet Take 1 tablet (100 mg total) 2 (two) times daily for 14 days by mouth. 28 tablet 0  . docusate sodium (COLACE) 100 MG capsule Take 100 mg by mouth 2 (two) times daily.    . folic acid (FOLVITE) 1 MG tablet Take 1 tablet (1  mg total) by mouth daily. 30 tablet 2  . LORazepam (ATIVAN) 0.5 MG tablet Take 1 tablet (0.5 mg total) by mouth every 8 (eight) hours as needed for anxiety. 30 tablet 0  . magic mouthwash w/lidocaine SOLN Take 5 mLs 4 (four) times daily as needed by mouth for mouth pain. Swish and spit 120 mL 0  . predniSONE (DELTASONE) 20 MG tablet Take 3 tablets (60 mg total) by mouth daily with breakfast. 21 tablet 0  . senna (SENOKOT) 8.6 MG TABS tablet Take 1 tablet by mouth.     No current facility-administered medications for this visit.     REVIEW OF SYSTEMS:    A 10+ POINT REVIEW OF SYSTEMS WAS OBTAINED including neurology, dermatology, psychiatry, cardiac, respiratory, lymph, extremities, GI, GU, Musculoskeletal, constitutional, breasts, reproductive, HEENT.  All pertinent positives are noted in the HPI.  All others are negative.  PHYSICAL EXAMINATION:  ECOG PERFORMANCE STATUS: 1 - Symptomatic but completely ambulatory . Vitals:   08/11/17 0920  BP: 113/60  Pulse: 88  Resp: 18  Temp: 99.2 F (37.3 C)  SpO2: 100%   Filed Weights   08/11/17 0920  Weight: 141 lb 14.4 oz (64.4 kg)   .Body mass index is 22.9 kg/m.  GENERAL:alert, in no acute distress and comfortable SKIN: no acute rashes, no significant lesions EYES: conjunctiva are pink and non-injected, sclera anicteric OROPHARYNX: MMM, no exudates, no oropharyngeal erythema or ulceration NECK: supple, no JVD LYMPH:  no palpable lymphadenopathy in the cervical, axillary or inguinal regions LUNGS: clear to auscultation b/l with normal respiratory effort HEART: regular rate & rhythm ABDOMEN:  normoactive bowel sounds, non tender. Massive splenomegaly. No TTP Extremity: resolved 1+ pedal edema bilaterally.   PSYCH: alert & oriented x 3 with fluent speech NEURO: no focal motor/sensory deficits  LABORATORY DATA:  I have reviewed the data as listed  . CBC Latest Ref Rng & Units 08/11/2017 08/04/2017 07/28/2017  WBC 3.9 - 10.3 10e3/uL  5.0 5.7 9.2  Hemoglobin 11.6 - 15.9 g/dL 7.1(L) 7.5(L) 8.5(L)  Hematocrit 34.8 - 46.6 % 22.7(L) 23.9(L) 27.0(L)  Platelets 145 - 400 10e3/uL 57(L) 56(L) 48(L)   . CBC    Component Value Date/Time   WBC 5.0 08/11/2017 0900   RBC 2.59 (L) 08/11/2017 0900   HGB 7.1 (L) 08/11/2017 0900   HCT 22.7 (L) 08/11/2017 0900   PLT 57 (L) 08/11/2017 0900   MCV 87.6 08/11/2017 0900   MCH 27.4 08/11/2017 0900   MCHC 31.3 (L) 08/11/2017 0900   RDW 19.4 (H) 08/11/2017 0900   LYMPHSABS 3.4 (H) 08/11/2017 0900   MONOABS 0.5 08/11/2017 0900   EOSABS 0.0 08/11/2017 0900   BASOSABS 0.0 08/11/2017 0900    . CMP Latest Ref Rng & Units 08/11/2017 08/04/2017 07/28/2017  Glucose 70 - 140 mg/dl 96 93 93  BUN 7.0 - 26.0 mg/dL  18.1 16.3 16.7  Creatinine 0.6 - 1.1 mg/dL 0.9 0.8 0.8  Sodium 136 - 145 mEq/L 136 136 136  Potassium 3.5 - 5.1 mEq/L 4.2 5.3(H) 4.2  CO2 22 - 29 mEq/L 21(L) 22 21(L)  Calcium 8.4 - 10.4 mg/dL 8.8 8.9 8.7  Total Protein 6.4 - 8.3 g/dL 6.8 6.8 6.9  Total Bilirubin 0.20 - 1.20 mg/dL 2.61(H) 2.31(H) 2.33(H)  Alkaline Phos 40 - 150 U/L 50 51 44  AST 5 - 34 U/L _0 ALT 0 - 55 U/L _1 Component     Latest Ref Rng & Units 07/13/2017  LDH     125 - 245 U/L 232  Hep C Virus Ab     0.0 - 0.9 s/co ratio <0.1  HIV Screen 4th Generation wRfx     Non Reactive Non Reactive  Hep B Core Ab, Tot     Negative Negative  Hepatitis B Surface Ag     Negative Negative   Component     Latest Ref Rng & Units 07/28/2017  LDH     125 - 245 U/L 200  Uric Acid, Serum     2.6 - 7.4 mg/dl 6.0  Phosphorus     2.5 - 4.5 mg/dL 3.8      RADIOGRAPHIC STUDIES: I have personally reviewed the radiological images as listed and agreed with the findings in the report. Nm Pet Image Initial (pi) Skull Base To Thigh  Result Date: 07/25/2017 CLINICAL DATA:  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 EXAM: NUCLEAR MEDICINE PET SKULL BASE TO THIGH TECHNIQUE: 6.9 mCi  F-18 FDG was injected intravenously. Full-ring PET imaging was performed from the skull base to thigh after the radiotracer. CT data was obtained and used for attenuation correction and anatomic localization. FASTING BLOOD GLUCOSE:  Value: 114 mg/dl COMPARISON:  None. FINDINGS: NECK No hypermetabolic lymph nodes in the neck. CHEST No hypermetabolic mediastinal or hilar nodes. No suspicious pulmonary nodules on the CT scan. Atherosclerotic calcification of the aortic arch. Mild cardiomegaly. Low-density blood pool. Background mediastinal blood pool activity 2.0. ABDOMEN/PELVIS The spleen measures 24.2 by 14.4 by 31.6 cm (volume = 5770 cm^3). Both splenic density and metabolic activity are fairly homogeneous, with maximum SUV of a large representative portion of the spleen at 3.2. Mildly prominent vasculature along the splenic hilum. Surrounding structures are displaced by the massively enlarged spleen. No appreciable hypermetabolic adenopathy. Physiologic uptake in bowel. Small left groin hernia contains adipose tissue and a small amount of fluid. Sigmoid diverticulosis without active diverticulitis. Background hepatic uptake level 3.2. SKELETON No focal hypermetabolic activity to suggest skeletal metastasis. IMPRESSION: 1. Massive splenomegaly, splenic volume 5770 cubic cm, with homogeneous low grade activity throughout the spleen equal to that of the physiologic activity in the liver, and above the background blood pool activity in the mediastinum. No pathologic adenopathy identified. No bony involvement noted. 2. Other imaging findings of potential clinical significance: Aortic Atherosclerosis (ICD10-I70.0). Mild cardiomegaly. Low-density blood pool suggesting anemia. Sigmoid diverticulosis. Small left groin hernia containing adipose tissue and a small amount of fluid. Electronically Signed   By: Van Clines M.D.   On: 07/25/2017 09:28   ASSESSMENT & PLAN:   Michelle Cohen is a wonderful 66 y.o.  caucasian female who presents to our clinic to discuss ongoing management of the following:   1. 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  Her clinical presentation is also representative of this and that this may have been present over months to possibly years.  -With her pattern of involvement and results thus far it is likely that she has splenic marginal zone type lymphoma.  -PET scan on 07/25/2017 with results showing: Massive splenomegaly, splenic volume 5770 cubic cm, with homogeneous low grade activity throughout the spleen equal to that of the physiologic activity in the liver, and above the background blood pool activity in the mediastinum. No pathologic adenopathy identified. No bony involvement noted.  -Lymphocytes have continue to improve considerably. 3.4 today. Hgb has still remained elevated.  -I did discuss with them the possibility of extending her treatment and possibly adding Bendamustine chemotherapy due to the bulk of disease remaining within the spleen after standard treatments. We will explore this possibility as her counts trend over the next few weeks.  2. 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  -Hgb at 9.7 as of 07/21/2017  -PLT at 48k as of 07/28/2017 Hgb at 8.5 as of 07/28/2017 -Count improved with short prednisone course prior to treatment, will hopefully continue to improve throughout treatment. We will monitor.  -She did receive 1u PRBC which temporarily improved her fatigue. Her Hgb has continued to decreased  to 7.1 today, 08/11/17, despite this. I expect that her spleen has sequestrated some of this. She has not had any issues with bleeding.  -We will transfuse another unit tomorrow and another later next week. Trying to space out PRBC transfusions to avoid excessive splenic sequestration. -elevated bilirubin with normal LDH previously suggest against overt hemolysis.  3. Rituxan hypersensitivity reaction - infusion rate related Had some muscle cramping and mild SOB needing additional steroids, benadryl and pepacid and Albuterol. Had to complete Rituxan at lower rate. Patient would not be a candidate for rapid infusion protocol and would need to keep the next infusion at the tolerate rate and not rate escalate. -added pepcid, steroids and singulair to premedications. -Advised the patient to use anti-histamine, Zyrtec or Claritin in between treatments  -She did have a more severe infusion reaction last time, we will front load her with IV Solumedrol as her steroid and Ativan for her fourth infusion to hopefully assist with this. We will monitor closely.   Plan -Labs stable. No treatment-related prohibitive toxicity. -Patient appropriate to proceed with her fourth cycle of Rituxan with adjusted premedications.   4. Oral mucositis/ HSV outbreak resolving -continue with acyclovir for prophylaxis -this has been improving throughout treatment   5. Small carbuncle to the right anterior lower leg -I have recommended her to begin a topical OTC antibiotic  -resolved  Schedule 1u PRBC tomorrow and 1 unit of PRBC with labs on 08/16/2017 CT abd/pelvis in 12 days RTC with Dr Irene Limbo in 2 weeks with labs and CT abd/pelvis  All of the patients questions were answered with apparent satisfaction. The patient knows to call the clinic with any problems, questions or concerns.  I spent 25 minutes counseling the patient face to face. The total time spent in the appointment was 30 minutes and more than 50% was on  counseling and direct patient cares.  Sullivan Lone MD  MS AAHIVMS [facility] Hematology/Oncology Physician [place]  (Office):       [phone] (Work cell):  [phone] (Fax):           [phone]  08/11/2017 9:14 AM  This document serves as a record of services personally performed by Sullivan Lone, MD. It was created on his behalf by Reola Mosher, a trained medical scribe. The creation of this record is based on the scribe's personal observations and the provider's statements to them.   Brunetta Genera MD MS.I have reviewed the above documentation for accuracy and completeness, and I agree with the above.

## 2017-08-11 NOTE — Patient Instructions (Signed)
Thank you for choosing [facility] to provide your oncology and hematology care.  To afford each patient quality time with our providers, please arrive 30 minutes before your scheduled appointment time.  If you arrive late for your appointment, you may be asked to reschedule.  We strive to give you quality time with our providers, and arriving late affects you and other patients whose appointments are after yours.   If you are a no show for multiple scheduled visits, you may be dismissed from the clinic at the providers discretion.    Again, thank you for choosing [facility], our hope is that these requests will decrease the amount of time that you wait before being seen by our physicians.  ______________________________________________________________________  Should you have questions after your visit to the [facility], please contact our office at [phone] between the hours of 8:30 and 4:30 p.m.    Voicemails left after 4:30p.m will not be returned until the following business day.    For prescription refill requests, please have your pharmacy contact us directly.  Please also try to allow 48 hours for prescription requests.    Please contact the scheduling department for questions regarding scheduling.  For scheduling of procedures such as PET scans, CT scans, MRI, Ultrasound, etc please contact central scheduling at [phone].    Resources For Cancer Patients and Caregivers:   Oncolink.org:  A wonderful resource for patients and healthcare providers for information regarding your disease, ways to tract your treatment, what to expect, etc.     American Cancer Society:  800-227-2345  Can help patients locate various types of support and financial assistance  Cancer Care: 1-800-813-HOPE (4673) Provides financial assistance, online support groups, medication/co-pay assistance.    [place] DSS:  336-641-3447 Where to apply for food  stamps, Medicaid, and utility assistance  Medicare Rights Center: 800-333-4114 Helps people with Medicare understand their rights and benefits, navigate the Medicare system, and secure the quality healthcare they deserve  SCAT: 336-333-6589 [place] [employer]'s shared-ride transportation service for eligible riders who have a disability that prevents them from riding the fixed route bus.    For additional information on assistance programs please contact our social worker:   Grier Hock/Abigail Elmore:  [phone]            

## 2017-08-12 ENCOUNTER — Ambulatory Visit: Payer: Self-pay

## 2017-08-12 DIAGNOSIS — C8307 Small cell B-cell lymphoma, spleen: Secondary | ICD-10-CM

## 2017-08-12 DIAGNOSIS — D649 Anemia, unspecified: Secondary | ICD-10-CM

## 2017-08-12 MED ORDER — SODIUM CHLORIDE 0.9 % IV SOLN
250.0000 mL | Freq: Once | INTRAVENOUS | Status: AC
  Administered 2017-08-12: 250 mL via INTRAVENOUS

## 2017-08-12 MED ORDER — ACETAMINOPHEN 325 MG PO TABS
650.0000 mg | ORAL_TABLET | Freq: Once | ORAL | Status: AC
  Administered 2017-08-12: 650 mg via ORAL

## 2017-08-12 MED ORDER — DIPHENHYDRAMINE HCL 25 MG PO CAPS
ORAL_CAPSULE | ORAL | Status: AC
  Filled 2017-08-12: qty 1

## 2017-08-12 MED ORDER — ACETAMINOPHEN 325 MG PO TABS
ORAL_TABLET | ORAL | Status: AC
  Filled 2017-08-12: qty 2

## 2017-08-12 MED ORDER — DIPHENHYDRAMINE HCL 25 MG PO CAPS
25.0000 mg | ORAL_CAPSULE | Freq: Once | ORAL | Status: AC
  Administered 2017-08-12: 25 mg via ORAL

## 2017-08-12 NOTE — Patient Instructions (Signed)

## 2017-08-14 ENCOUNTER — Other Ambulatory Visit: Payer: Self-pay

## 2017-08-14 ENCOUNTER — Telehealth: Payer: Self-pay

## 2017-08-14 ENCOUNTER — Ambulatory Visit (HOSPITAL_BASED_OUTPATIENT_CLINIC_OR_DEPARTMENT_OTHER): Payer: Self-pay

## 2017-08-14 DIAGNOSIS — D649 Anemia, unspecified: Secondary | ICD-10-CM

## 2017-08-14 DIAGNOSIS — C8307 Small cell B-cell lymphoma, spleen: Secondary | ICD-10-CM

## 2017-08-14 LAB — PREPARE RBC (CROSSMATCH)

## 2017-08-14 MED ORDER — SODIUM CHLORIDE 0.9% FLUSH
10.0000 mL | INTRAVENOUS | Status: DC | PRN
  Filled 2017-08-14: qty 10

## 2017-08-14 MED ORDER — HEPARIN SOD (PORK) LOCK FLUSH 100 UNIT/ML IV SOLN
500.0000 [IU] | Freq: Every day | INTRAVENOUS | Status: DC | PRN
  Filled 2017-08-14: qty 5

## 2017-08-14 MED ORDER — SODIUM CHLORIDE 0.9% FLUSH
3.0000 mL | INTRAVENOUS | Status: DC | PRN
  Filled 2017-08-14: qty 10

## 2017-08-14 MED ORDER — ACETAMINOPHEN 325 MG PO TABS
650.0000 mg | ORAL_TABLET | Freq: Once | ORAL | Status: AC
  Administered 2017-08-14: 650 mg via ORAL

## 2017-08-14 MED ORDER — DIPHENHYDRAMINE HCL 25 MG PO CAPS
25.0000 mg | ORAL_CAPSULE | Freq: Once | ORAL | Status: AC
  Administered 2017-08-14: 25 mg via ORAL

## 2017-08-14 MED ORDER — SODIUM CHLORIDE 0.9 % IV SOLN: 250.0000 mL | Freq: Once | INTRAVENOUS | Status: DC

## 2017-08-14 MED ORDER — ACETAMINOPHEN 325 MG PO TABS
ORAL_TABLET | ORAL | Status: AC
  Filled 2017-08-14: qty 2

## 2017-08-14 MED ORDER — SODIUM CHLORIDE 0.9 % IV SOLN
250.0000 mL | Freq: Once | INTRAVENOUS | Status: AC
  Administered 2017-08-14: 250 mL via INTRAVENOUS

## 2017-08-14 MED ORDER — HEPARIN SOD (PORK) LOCK FLUSH 100 UNIT/ML IV SOLN
250.0000 [IU] | INTRAVENOUS | Status: DC | PRN
  Filled 2017-08-14: qty 5

## 2017-08-14 MED ORDER — DIPHENHYDRAMINE HCL 25 MG PO CAPS
ORAL_CAPSULE | ORAL | Status: AC
  Filled 2017-08-14: qty 1

## 2017-08-14 NOTE — Patient Instructions (Signed)

## 2017-08-14 NOTE — Telephone Encounter (Signed)
Pt SO walked up to RN desk to ask questions without specifying pt whom she was speaking about. RN requested further information. Requesting clarification about allopurinol...currently out of this medication. Does Dr. Irene Limbo want her to continue taking. Concern about scheduling appointments. Scheduling message placed for lab and doctor visit 11/30 or 12/3 to discuss CT scan. Will f/u with MD regarding allopurinol.

## 2017-08-15 ENCOUNTER — Other Ambulatory Visit: Payer: Self-pay

## 2017-08-15 ENCOUNTER — Telehealth: Payer: Self-pay

## 2017-08-15 LAB — TYPE AND SCREEN
ABO/RH(D): A POS
Antibody Screen: NEGATIVE
Unit division: 0
Unit division: 0

## 2017-08-15 LAB — BPAM RBC
BLOOD PRODUCT EXPIRATION DATE: 201812042359
BLOOD PRODUCT EXPIRATION DATE: 201812052359
ISSUE DATE / TIME: 201811170920
ISSUE DATE / TIME: 201811191401
UNIT TYPE AND RH: 6200
Unit Type and Rh: 6200

## 2017-08-15 MED ORDER — ALLOPURINOL 100 MG PO TABS: 100.0000 mg | ORAL_TABLET | Freq: Every day | ORAL | 2 refills | Status: DC

## 2017-08-15 MED FILL — ALLOPURINOL 100 MG TABS: 100 | 30 days supply | Qty: 30 | Fill #0

## 2017-08-15 NOTE — Telephone Encounter (Signed)
Patient to continue on allopurinol 100mg  bid until the completion of Rituxan. Medication to go to [facility]. Pt additionally intended to be scheduled for lab and doctor visit on 11/30 or 12/3. Pt was not scheduled per LOS at 08/11/17 as communicated by patient SO. Lab and doctor visit scheduled on 11/29. Pt performed read back of appointment date and times.

## 2017-08-16 ENCOUNTER — Encounter (HOSPITAL_COMMUNITY): Payer: Self-pay

## 2017-08-16 ENCOUNTER — Other Ambulatory Visit: Payer: Self-pay

## 2017-08-20 NOTE — Progress Notes (Signed)
HEMATOLOGY/ONCOLOGY FOLLOW UP NOTE  Date of Service: 08/24/2017  Patient Care Team: Patient, No Pcp Per as PCP - General (General Practice)  CHIEF COMPLAINTS/PURPOSE OF INITIAL CONSULTATION:  Splenic marginal zone B-cell Non-Hodgkin's lymphoma   HISTORY OF PRESENTING ILLNESS:   Michelle Cohen is a wonderful 66 y.o. female who has been referred to [place] from [place] [facility] for evaluation and management of B-cell lymphoma. She presents to her appointment today with her partner of 21 years. She states that she moved to the area ~2 years ago secondary to her sister's diagnosis of lung cancer (unfortunately, now passed) and taking care of her mother's Alzheimer's. Generally, prior to this issue she has been very healthy and without chronic medical conditions and not on chronic medical therapies/treatments.   Initially, the patient was admitted to [facility] for abdominal pain and significant hernia. She reports that she was visiting in [place] and while flying there she had an acute onset of sweats and left lower quadrant pain. She reports that her hernia was not present at that time, but as she continued to fly her hernia had become more noticeable and enlarged in her lower abdomen. Additionally, her partner reports that her episode of hydrosis was very significant, stating that it was pooling below her. On their arrival to [facility] on 06/21/17, a CT A/P was performed which showed an incarcerated hernia. Of note, her CT showed massive splenomegaly at 32cm in greatest dimension. Manual reduction of her hernia was attempted while in the ED but was unsuccessful. Pre-operative notes state that following intubation manual reduction was performed successfully and hernia was repaired laparoscopically. CT CAP was performed as well which was without the presence of additional surrounding LAD. She did have flow cytometry performed during her admission as well which showed B-cell lymphocytes  which were monoclonal. A bone marrow biopsy was also examined which showed that it was markedly hypercellular (~95%) with prominent interstitial infiltrate of small lymphocytes.   Prior to her diagnosis, she mostly felt typically well overall. She states that she did suffer from insomnia which she related to over-thinking at the time regarding her familial situation. She did experience some fatigue and weight loss with this as well. Her partner reports that she noticed her weight loss approximately 43moago, which she quantifies at approximately 30lbs since onset. Towards the end of this six month periods and just prior to the appearance of her hernia the patient began to notice some bothersome abdominal distension. She also noticed some significant fatigue and she would nap for longer periods following her daily workouts. Additionally, her partner would notice that she would have issues with breathing and this appeared irregular and more labored, especially at night while laying down. She also mentions that she did experience intermittent night sweats throughout this six month period as well. She denies lightheadedness, dizziness, abdominal pain, back pain, fever, chills, abnormal bruising/bleeding, bloody stools, epistaxis, hematuria, gingival bleeding, or other bleeding issues within this period. She has not received any blood transfusions and has not previously had a tattoo; she is unconcerned with prior HepC exposure.   Since her hernia surgery, she reports that she feels great symptomatically overall and much improved. She does have some issues with abdominal bloating and discomfort, but this has been manageable. No overt abdominal pain.   Throughout all of this, she has attempted to try to remain within good spirits. Losing her sister recently has been hard, but she has been working through it  with her family; however, her partner voices that they do not have strong social support in this area as most of  their support is in [place] where they previously lived. She has been working through her sister's death with grief counseling which has been helping her.    She has never previously smoked, but she reports some moderate second-hand smoke through her father at a younger age. Her father did die of a PE, but she is unaware of any other diagnoses of clotting disorders or cancers within her family. No h/o alcohol abuse, chemical/radiation exposures. She worked previously in Industrial/product designer.   CURRENT TREATMENT:  Rituximab weekly x4 beginning on 07/21/17- now completed   INTERIM HISTORY:   [name] returns today for scheduled follow up. In the interim she has been doing relatively well. She is accompanied by her partner of 21 years, Caren Griffins. She has been doing relatively well in the interim. She has been relaxing more and staying at home, but this has not bothered her. She does report that her most recent PRBC infusion did cause some fatigue, but this has been improving. Her Hgb has improved to 8.7. Platelets and neutrophils are continuing to trend upward. CT still shows significant splenomegaly, this is likely due to continued sequestration. She continues to have minor night sweats, but this is likely contained to her upper body around her chest, neck and head and is much improved. Otherwise she has remained asymptomatic in the interim in regards to her spleen without over abdominal pain. She continues to have some intermittent, moderate back pain, but this has been manageable.   On review of systems, pt denies fever, chills, rash, mouth sores, weight loss, decreased appetite, urinary complaints. Denies pain otherwise outside of her back. Pt denies abdominal pain, nausea, vomiting. Pertinent positives are listed within the above HPI.   MEDICAL HISTORY:  No past medical history on file.  SURGICAL HISTORY: No past surgical history on file.  SOCIAL HISTORY: Social History    Socioeconomic History  . Marital status: Single    Spouse name: Not on file  . Number of children: Not on file  . Years of education: Not on file  . Highest education level: Not on file  Social Needs  . Financial resource strain: Not on file  . Food insecurity - worry: Not on file  . Food insecurity - inability: Not on file  . Transportation needs - medical: Not on file  . Transportation needs - non-medical: Not on file  Occupational History  . Not on file  Tobacco Use  . Smoking status: Never Smoker  . Smokeless tobacco: Never Used  Substance and Sexual Activity  . Alcohol use: Yes    Comment: glass of wine occasionally  . Drug use: No  . Sexual activity: Not on file  Other Topics Concern  . Not on file  Social History Narrative  . Not on file    FAMILY HISTORY: Sister - lung cancer Mother -Alzheimers dementia  ALLERGIES:  has No Known Allergies.  MEDICATIONS:  Current Outpatient Medications  Medication Sig Dispense Refill  . acyclovir (ZOVIRAX) 200 MG capsule Take 2 capsules (400 mg total) 2 (two) times daily by mouth. 60 capsule 0  . docusate sodium (COLACE) 100 MG capsule Take 100 mg by mouth 2 (two) times daily.    . folic acid (FOLVITE) 1 MG tablet Take 1 tablet (1 mg total) by mouth daily. 30 tablet 2  . LORazepam (ATIVAN) 0.5 MG  tablet Take 1 tablet (0.5 mg total) by mouth every 8 (eight) hours as needed for anxiety. 30 tablet 0  . magic mouthwash w/lidocaine SOLN Take 5 mLs 4 (four) times daily as needed by mouth for mouth pain. Swish and spit 120 mL 0  . senna (SENOKOT) 8.6 MG TABS tablet Take 1 tablet by mouth.     No current facility-administered medications for this visit.     REVIEW OF SYSTEMS:    A 10+ POINT REVIEW OF SYSTEMS WAS OBTAINED including neurology, dermatology, psychiatry, cardiac, respiratory, lymph, extremities, GI, GU, Musculoskeletal, constitutional, breasts, reproductive, HEENT.  All pertinent positives are noted in the HPI.  All  others are negative.  PHYSICAL EXAMINATION:  ECOG PERFORMANCE STATUS: 1 - Symptomatic but completely ambulatory . Vitals:   08/24/17 1035  BP: 104/73  Pulse: 97  Resp: 18  Temp: 98.9 F (37.2 C)  SpO2: 99%   Filed Weights   08/24/17 1035  Weight: 144 lb 3.2 oz (65.4 kg)   .Body mass index is 23.27 kg/m.  GENERAL:alert, in no acute distress and comfortable SKIN: no acute rashes, no significant lesions EYES: conjunctiva are pink and non-injected, sclera anicteric OROPHARYNX: MMM, no exudates, no oropharyngeal erythema or ulceration NECK: supple, no JVD LYMPH:  no palpable lymphadenopathy in the cervical, axillary or inguinal regions LUNGS: clear to auscultation b/l with normal respiratory effort HEART: regular rate & rhythm ABDOMEN:  normoactive bowel sounds, non tender. Massive splenomegaly. No TTP Extremity: resolved 1+ pedal edema bilaterally.   PSYCH: alert & oriented x 3 with fluent speech NEURO: no focal motor/sensory deficits  LABORATORY DATA:  I have reviewed the data as listed  . CBC Latest Ref Rng & Units 08/24/2017 08/11/2017 08/04/2017  WBC 3.9 - 10.3 10e3/uL 5.2 5.0 5.7  Hemoglobin 11.6 - 15.9 g/dL 8.7(L) 7.1(L) 7.5(L)  Hematocrit 34.8 - 46.6 % 27.9(L) 22.7(L) 23.9(L)  Platelets 145 - 400 10e3/uL 59(L) 57(L) 56(L)   . CBC    Component Value Date/Time   WBC 5.2 08/24/2017 1004   RBC 3.08 (L) 08/24/2017 1004   HGB 8.7 (L) 08/24/2017 1004   HCT 27.9 (L) 08/24/2017 1004   PLT 59 (L) 08/24/2017 1004   MCV 90.6 08/24/2017 1004   MCH 28.2 08/24/2017 1004   MCHC 31.2 (L) 08/24/2017 1004   RDW 19.4 (H) 08/24/2017 1004   LYMPHSABS 3.4 (H) 08/24/2017 1004   MONOABS 0.4 08/24/2017 1004   EOSABS 0.0 08/24/2017 1004   BASOSABS 0.0 08/24/2017 1004    . CMP Latest Ref Rng & Units 08/24/2017 08/11/2017 08/04/2017  Glucose 70 - 140 mg/dl 90 96 93  BUN 7.0 - 26.0 mg/dL 13.7 18.1 16.3  Creatinine 0.6 - 1.1 mg/dL 0.9 0.9 0.8  Sodium 136 - 145 mEq/L 138 136 136   Potassium 3.5 - 5.1 mEq/L 4.6 4.2 5.3(H)  CO2 22 - 29 mEq/L 24 21(L) 22  Calcium 8.4 - 10.4 mg/dL 9.0 8.8 8.9  Total Protein 6.4 - 8.3 g/dL 6.9 6.8 6.8  Total Bilirubin 0.20 - 1.20 mg/dL 2.35(H) 2.61(H) 2.31(H)  Alkaline Phos 40 - 150 U/L 53 50 51  AST 5 - 34 U/L _0 ALT 0 - 55 U/L _1 Component     Latest Ref Rng & Units 07/13/2017  LDH     125 - 245 U/L 232  Hep C Virus Ab     0.0 - 0.9 s/co ratio <0.1  HIV Screen 4th Generation wRfx  Non Reactive Non Reactive  Hep B Core Ab, Tot     Negative Negative  Hepatitis B Surface Ag     Negative Negative   Component     Latest Ref Rng & Units 07/28/2017  LDH     125 - 245 U/L 200  Uric Acid, Serum     2.6 - 7.4 mg/dl 6.0  Phosphorus     2.5 - 4.5 mg/dL 3.8      RADIOGRAPHIC STUDIES: I have personally reviewed the radiological images as listed and agreed with the findings in the report. Ct Abdomen Pelvis W Contrast  Result Date: 08/23/2017 CLINICAL DATA:  Stage IV non-Hodgkin's lymphoma. EXAM: CT ABDOMEN AND PELVIS WITH CONTRAST TECHNIQUE: Multidetector CT imaging of the abdomen and pelvis was performed using the standard protocol following bolus administration of intravenous contrast. CONTRAST:  125m ISOVUE-300 IOPAMIDOL (ISOVUE-300) INJECTION 61% COMPARISON:  PET-CT 07/25/2017 FINDINGS: Lower chest: Heart size upper normal. There is some atelectasis in the lower lungs bilaterally. Hepatobiliary: No focal abnormality within the liver parenchyma. There is no evidence for gallstones, gallbladder wall thickening, or pericholecystic fluid. No intrahepatic or extrahepatic biliary dilation. Pancreas: No focal mass lesion. No dilatation of the main duct. No intraparenchymal cyst. No peripancreatic edema. Spleen: Dramatic splenic enlargement again noted. Spleen measures 25.3 x 14.8 x 31.3 cm today which compares to 24.2 x 14.4 x 31.6 cm previously. This represents no substantial interval change in splenic volume calculated  previously at 5770 cc. Adrenals/Urinary Tract: No adrenal nodule or mass. 6 mm hypoattenuating lesion interpolar right kidney is too small to characterize but is likely a cyst. Other tiny cortical lesions are seen in the right kidney. No hydronephrosis. No evidence for hydroureter. The urinary bladder appears normal for the degree of distention. Stomach/Bowel: Stomach is nondistended. No gastric wall thickening. No evidence of outlet obstruction. Duodenum is normally positioned as is the ligament of Treitz. No small bowel wall thickening. No small bowel dilatation. The terminal ileum is normal. The appendix is normal. No gross colonic mass. No colonic wall thickening. No substantial diverticular change. Vascular/Lymphatic: There is abdominal aortic atherosclerosis without aneurysm. Borderline lymphadenopathy noted hepatoduodenal ligament with 13 mm short axis lymph nodes seen image 35 series 2 and there is a 15 mm short axis retrocaval lymph node visible on image 35 series 2. No para-aortic lymphadenopathy. No pelvic sidewall lymphadenopathy. Reproductive: The uterus has normal CT imaging appearance. There is no adnexal mass. Other: No substantial intraperitoneal free fluid. Musculoskeletal: Tiny left groin hernia contains fat and a trace amount of fluid. Bone windows reveal no worrisome lytic or sclerotic osseous lesions. IMPRESSION: 1. Marked splenomegaly without substantial interval change in splenic size/volume. 2. Borderline lymphadenopathy in the hepatoduodenal ligament. 3.  Aortic Atherosclerois (ICD10-170.0) Electronically Signed   By: EMisty StanleyM.D.   On: 08/23/2017 11:06   ASSESSMENT & PLAN:   DMirtie Bastyris a wonderful 66y.o. caucasian female who presents to our clinic to discuss ongoing management of the following:   1. 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  Her clinical presentation is also representative of this and that this may have been present over months to possibly years.  -With her pattern of involvement and results thus  far it is likely that she has splenic marginal zone type lymphoma.  -PET scan on 07/25/2017 with results showing: Massive splenomegaly, splenic volume 5770 cubic cm, with homogeneous low grade activity throughout the spleen equal to that of the physiologic activity in the liver, and above the background blood pool activity in the mediastinum. No pathologic adenopathy identified. No bony involvement noted.  -Lymphocytes have continue to improve considerably. 3.4 again today as it was last time. Hgb is improving. -I did discuss with them the possibility of extending her treatment and possibly adding Bendamustine chemotherapy due to the bulk of disease remaining within the spleen after standard treatments. We will explore this possibility as her counts trend over the next few weeks. -CT A/p on 08/23/17 with results of: Marked splenomegaly without substantial interval change in splenic size/volume. Borderline lymphadenopathy in the hepatoduodenal ligament. Aortic Atherosclerosis. I discussed this with the patient in great detail. -I discussed with the patient her current therapies, options, and our continued path forward. She has now finished four weeks of Rituxan.  -She is having improved reticulocytosis and has shown less suppression than previously with her treatment, I expect her anemia and other blood counts will continue to improve with this over the next several weeks.   -Given her excess burden of disease within the spleen and since she is moderately symptomatic we could begin to consider other treatment therapies such as RT to the spleen vs systemic  chemotherapy (BR), or surgical removal of the spleen.  -She would like to opt for surveillance at this time - I also would like her to see Radiation Oncology for their input in the interim. She is agreeable with this to have a discussion regarding this option to have help with more rapid and complete shrinkage of the spleen. -If Splenic RT or splenectomy is considered she will need splenectomy related vaccinations.  2. 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  -Hgb at 9.7 as of 07/21/2017  -PLT at 48k as of 07/28/2017 Hgb at 8.5 as of 07/28/2017 Plan -Anemia has continued to improve with treatment and PRBC infusion. Hgb is 8/7 today, 08/24/17.  3. Rituxan hypersensitivity reaction - infusion rate related Had some muscle cramping and mild SOB needing additional steroids, benadryl and pepacid and Albuterol. Had to complete Rituxan at lower rate. Patient would not be a candidate for rapid infusion protocol and would need to keep the next infusion at the tolerate rate and not rate escalate. -added pepcid, steroids and singulair to premedications and then tolerated treatment better. -Advised the patient to use anti-histamine, Zyrtec or Claritin in between treatments  -She did have a more severe infusion reaction last time, we front loaded her with IV Solumedrol as her steroid and Ativan for her fourth infusion and she tolerated her last infusion very well.   Plan -Labs stable. No treatment-related prohibitive toxicity.   4. Oral mucositis/HSV outbreak -- nearly resolved -continue with acyclovir for prophylaxis -this has been improving throughout treatment   5. Small carbuncle to the right anterior lower leg -I have recommended her to begin a topical OTC antibiotic  -resolved   Referral to radonc  Labs in 10-12 days RTC with Dr Irene Limbo on  12/20 or 12/21  All of the patients questions were answered with apparent satisfaction. The patient knows to call the clinic with any problems, questions or concerns.  I spent 30 minutes counseling the patient face to face. The total time spent in the appointment was 40 minutes and more than 50% was on counseling and direct patient cares.  Sullivan Lone MD [place] AAHIVMS [facility] Hematology/Oncology Physician [facility]  (Office):       [phone] (Work cell):  [phone] (Fax):           [phone]  08/24/2017 11:34 AM  This document serves as a record of services personally performed by Sullivan Lone, MD. It was created on his behalf by Reola Mosher, a trained medical scribe. The creation of this record is based on the scribe's personal observations and the provider's statements to them.   .I have reviewed the above documentation for accuracy and completeness, and I agree with the above. Brunetta Genera MD

## 2017-08-21 MED FILL — FOLIC ACID 1 MG TABLET: 1 | 30 days supply | Qty: 30 | Fill #1

## 2017-08-23 ENCOUNTER — Other Ambulatory Visit: Payer: Self-pay

## 2017-08-23 ENCOUNTER — Encounter: Payer: Self-pay | Admitting: Hematology

## 2017-08-23 ENCOUNTER — Ambulatory Visit (HOSPITAL_COMMUNITY)
Admission: RE | Admit: 2017-08-23 | Discharge: 2017-08-23 | Disposition: A | Discharge status: Home / Self Care | Payer: Self-pay | Source: Ambulatory Visit | Attending: Hematology | Admitting: Hematology

## 2017-08-23 DIAGNOSIS — R161 Splenomegaly, not elsewhere classified: Secondary | ICD-10-CM | POA: Insufficient documentation

## 2017-08-23 DIAGNOSIS — I700 Atherosclerosis of aorta: Secondary | ICD-10-CM | POA: Insufficient documentation

## 2017-08-23 DIAGNOSIS — C8307 Small cell B-cell lymphoma, spleen: Secondary | ICD-10-CM | POA: Insufficient documentation

## 2017-08-23 MED ORDER — IOPAMIDOL (ISOVUE-300) INJECTION 61%
INTRAVENOUS | Status: AC
Start: 2017-08-23 — End: 2017-08-23
  Filled 2017-08-23: qty 100

## 2017-08-23 MED ORDER — IOPAMIDOL (ISOVUE-300) INJECTION 61%
100.0000 mL | Freq: Once | INTRAVENOUS | Status: AC | PRN
  Administered 2017-08-23: 100 mL via INTRAVENOUS

## 2017-08-23 NOTE — Progress Notes (Signed)
Pt wanted to apply for a discount thru the hospital so she bought me the completed application with the required docs needed for processing.  I sent them to Johnna Acosta in customer service thru inter office mail today.

## 2017-08-24 ENCOUNTER — Ambulatory Visit (HOSPITAL_BASED_OUTPATIENT_CLINIC_OR_DEPARTMENT_OTHER): Payer: Self-pay | Admitting: Hematology

## 2017-08-24 ENCOUNTER — Other Ambulatory Visit (HOSPITAL_BASED_OUTPATIENT_CLINIC_OR_DEPARTMENT_OTHER): Payer: Self-pay

## 2017-08-24 ENCOUNTER — Telehealth: Payer: Self-pay | Admitting: Hematology

## 2017-08-24 ENCOUNTER — Encounter: Payer: Self-pay | Admitting: Hematology

## 2017-08-24 ENCOUNTER — Encounter: Payer: Self-pay | Admitting: Radiation Oncology

## 2017-08-24 VITALS — BP 104/73 | HR 97 | Temp 98.9°F | Resp 18 | Ht 66.0 in | Wt 144.2 lb

## 2017-08-24 DIAGNOSIS — C8307 Small cell B-cell lymphoma, spleen: Secondary | ICD-10-CM

## 2017-08-24 DIAGNOSIS — R61 Generalized hyperhidrosis: Secondary | ICD-10-CM

## 2017-08-24 DIAGNOSIS — D72829 Elevated white blood cell count, unspecified: Secondary | ICD-10-CM

## 2017-08-24 DIAGNOSIS — D649 Anemia, unspecified: Secondary | ICD-10-CM

## 2017-08-24 DIAGNOSIS — R161 Splenomegaly, not elsewhere classified: Secondary | ICD-10-CM

## 2017-08-24 DIAGNOSIS — D61818 Other pancytopenia: Secondary | ICD-10-CM

## 2017-08-24 DIAGNOSIS — K1230 Oral mucositis (ulcerative), unspecified: Secondary | ICD-10-CM

## 2017-08-24 DIAGNOSIS — D731 Hypersplenism: Secondary | ICD-10-CM

## 2017-08-24 DIAGNOSIS — M545 Low back pain: Secondary | ICD-10-CM

## 2017-08-24 LAB — COMPREHENSIVE METABOLIC PANEL
ALT: 8 U/L (ref 0–55)
AST: 14 U/L (ref 5–34)
Albumin: 3.3 g/dL — ABNORMAL LOW (ref 3.5–5.0)
Alkaline Phosphatase: 53 U/L (ref 40–150)
Anion Gap: 9 mEq/L (ref 3–11)
BUN: 13.7 mg/dL (ref 7.0–26.0)
CHLORIDE: 105 meq/L (ref 98–109)
CO2: 24 meq/L (ref 22–29)
CREATININE: 0.9 mg/dL (ref 0.6–1.1)
Calcium: 9 mg/dL (ref 8.4–10.4)
EGFR: >60 mL/min/{1.73_m2} (ref >60)
Glucose: 90 mg/dl (ref 70–140)
POTASSIUM: 4.6 meq/L (ref 3.5–5.1)
SODIUM: 138 meq/L (ref 136–145)
Total Bilirubin: 2.35 mg/dL — ABNORMAL HIGH (ref 0.20–1.20)
Total Protein: 6.9 g/dL (ref 6.4–8.3)

## 2017-08-24 LAB — CBC & DIFF AND RETIC
BASO%: 0.2 % (ref 0.0–2.0)
BASOS ABS: 0 10*3/uL (ref 0.0–0.1)
EOS ABS: 0 10*3/uL (ref 0.0–0.5)
EOS%: 0.2 % (ref 0.0–7.0)
HEMATOCRIT: 27.9 % — AB (ref 34.8–46.6)
HEMOGLOBIN: 8.7 g/dL — AB (ref 11.6–15.9)
Immature Retic Fract: 14.7 % — ABNORMAL HIGH (ref 1.60–10.00)
LYMPH%: 65.1 % — AB (ref 14.0–49.7)
MCH: 28.2 pg (ref 25.1–34.0)
MCHC: 31.2 g/dL — ABNORMAL LOW (ref 31.5–36.0)
MCV: 90.6 fL (ref 79.5–101.0)
MONO#: 0.4 10*3/uL (ref 0.1–0.9)
MONO%: 7.9 % (ref 0.0–14.0)
NEUT#: 1.4 10*3/uL — ABNORMAL LOW (ref 1.5–6.5)
NEUT%: 26.6 % — ABNORMAL LOW (ref 38.4–76.8)
PLATELETS: 59 10*3/uL — AB (ref 145–400)
RBC: 3.08 10*6/uL — ABNORMAL LOW (ref 3.70–5.45)
RDW: 19.4 % — ABNORMAL HIGH (ref 11.2–14.5)
RETIC %: 3.05 % — AB (ref 0.70–2.10)
Retic Ct Abs: 93.94 10*3/uL — ABNORMAL HIGH (ref 33.70–90.70)
WBC: 5.2 10*3/uL (ref 3.9–10.3)
lymph#: 3.4 10*3/uL — ABNORMAL HIGH (ref 0.9–3.3)
nRBC: 0 % (ref 0–0)

## 2017-08-24 LAB — LACTATE DEHYDROGENASE: LDH: 235 U/L (ref 125–245)

## 2017-08-24 NOTE — Patient Instructions (Signed)
Thank you for choosing [facility] to provide your oncology and hematology care.  To afford each patient quality time with our providers, please arrive 30 minutes before your scheduled appointment time.  If you arrive late for your appointment, you may be asked to reschedule.  We strive to give you quality time with our providers, and arriving late affects you and other patients whose appointments are after yours.   If you are a no show for multiple scheduled visits, you may be dismissed from the clinic at the providers discretion.    Again, thank you for choosing [facility], our hope is that these requests will decrease the amount of time that you wait before being seen by our physicians.  ______________________________________________________________________  Should you have questions after your visit to the [facility], please contact our office at [phone] between the hours of 8:30 and 4:30 p.m.    Voicemails left after 4:30p.m will not be returned until the following business day.    For prescription refill requests, please have your pharmacy contact us directly.  Please also try to allow 48 hours for prescription requests.    Please contact the scheduling department for questions regarding scheduling.  For scheduling of procedures such as PET scans, CT scans, MRI, Ultrasound, etc please contact central scheduling at [phone].    Resources For Cancer Patients and Caregivers:   Oncolink.org:  A wonderful resource for patients and healthcare providers for information regarding your disease, ways to tract your treatment, what to expect, etc.     American Cancer Society:  800-227-2345  Can help patients locate various types of support and financial assistance  Cancer Care: 1-800-813-HOPE (4673) Provides financial assistance, online support groups, medication/co-pay assistance.    [place] DSS:  336-641-3447 Where to apply for food  stamps, Medicaid, and utility assistance  Medicare Rights Center: 800-333-4114 Helps people with Medicare understand their rights and benefits, navigate the Medicare system, and secure the quality healthcare they deserve  SCAT: 336-333-6589 [place] [employer]'s shared-ride transportation service for eligible riders who have a disability that prevents them from riding the fixed route bus.    For additional information on assistance programs please contact our social worker:   Grier Hock/Abigail Elmore:  [phone]            

## 2017-08-24 NOTE — Telephone Encounter (Signed)
Gave avs and calendar for December  °

## 2017-08-29 ENCOUNTER — Ambulatory Visit: Payer: Self-pay | Admitting: Radiation Oncology

## 2017-08-29 ENCOUNTER — Ambulatory Visit: Payer: Self-pay

## 2017-09-01 NOTE — Progress Notes (Signed)
.  Lymphoma Location(s) / Histology: Splenic marginal zonef B-cell Stage IV Non-Hodgkins  lymphoma   presented months ago with symptoms of: night  sweats and left lower quadrant pain,fatigue  Biopsies of (if applicable) revealed: Interpretation 10/18/187: Peripheral Blood Flow Cytometry - A LAMBDA-RESTRICTED B-CELL POPULATION COMPRISES 80% OF ALL LYMPHOCYTES  Past/Anticipated interventions by medical oncology, if any: Dr. Irene Limbo, MD 08/11/17:Current therapy: Rituximab weekly x 4 beginning on 07/21/17  Weight changes, if any, over the past 6 months:30 lbs   Recurrent fevers, or drenching night sweats, if any: night sweats yes still and in day time also   SAFETY ISSUES: NO  Prior radiation?  NO  Pacemaker/ICD?  NO  Possible current pregnancy? No  Is the patient on methotrexate?  NO  Current Complaints / other details: hernia surgery   Father deceased PE,  Sister lung cancr, Mother-Alzheimers dementia, BP (!) 114/55   Pulse 81   Temp (!) 97.1 F (36.2 C) (Oral)   Resp 20   Ht 5\' 6"  (1.676 m)   Wt 144 lb (65.3 kg)   SpO2 100% Comment: room air  BMI 23.24 kg/m   Wt Readings from Last 3 Encounters:  09/05/17 144 lb (65.3 kg)  08/24/17 144 lb 3.2 oz (65.4 kg)  08/11/17 141 lb 14.4 oz (64.4 kg)

## 2017-09-05 ENCOUNTER — Other Ambulatory Visit (HOSPITAL_BASED_OUTPATIENT_CLINIC_OR_DEPARTMENT_OTHER): Payer: Self-pay

## 2017-09-05 ENCOUNTER — Ambulatory Visit
Admission: RE | Admit: 2017-09-05 | Discharge: 2017-09-05 | Disposition: A | Discharge status: Home / Self Care | Payer: Self-pay | Source: Ambulatory Visit | Attending: Radiation Oncology | Admitting: Radiation Oncology

## 2017-09-05 ENCOUNTER — Encounter: Payer: Self-pay | Admitting: Radiation Oncology

## 2017-09-05 DIAGNOSIS — R161 Splenomegaly, not elsewhere classified: Secondary | ICD-10-CM

## 2017-09-05 DIAGNOSIS — Z79899 Other long term (current) drug therapy: Secondary | ICD-10-CM | POA: Insufficient documentation

## 2017-09-05 DIAGNOSIS — C8307 Small cell B-cell lymphoma, spleen: Secondary | ICD-10-CM

## 2017-09-05 DIAGNOSIS — C8510 Unspecified B-cell lymphoma, unspecified site: Secondary | ICD-10-CM | POA: Insufficient documentation

## 2017-09-05 DIAGNOSIS — D61818 Other pancytopenia: Secondary | ICD-10-CM | POA: Insufficient documentation

## 2017-09-05 HISTORY — DX: Unspecified B-cell lymphoma, unspecified site: C85.10

## 2017-09-05 LAB — CBC & DIFF AND RETIC
BASO%: 0.3 % (ref 0.0–2.0)
BASOS ABS: 0 10*3/uL (ref 0.0–0.1)
EOS ABS: 0 10*3/uL (ref 0.0–0.5)
EOS%: 0.5 % (ref 0.0–7.0)
HEMATOCRIT: 27.2 % — AB (ref 34.8–46.6)
HGB: 8.5 g/dL — ABNORMAL LOW (ref 11.6–15.9)
IMMATURE RETIC FRACT: 14.6 % — AB (ref 1.60–10.00)
LYMPH#: 2.6 10*3/uL (ref 0.9–3.3)
LYMPH%: 65.7 % — ABNORMAL HIGH (ref 14.0–49.7)
MCH: 28.6 pg (ref 25.1–34.0)
MCHC: 31.3 g/dL — ABNORMAL LOW (ref 31.5–36.0)
MCV: 91.6 fL (ref 79.5–101.0)
MONO#: 0.3 10*3/uL (ref 0.1–0.9)
MONO%: 8.2 % (ref 0.0–14.0)
NEUT#: 1 10*3/uL — ABNORMAL LOW (ref 1.5–6.5)
NEUT%: 25.3 % — AB (ref 38.4–76.8)
PLATELETS: 78 10*3/uL — AB (ref 145–400)
RBC: 2.97 10*6/uL — ABNORMAL LOW (ref 3.70–5.45)
RDW: 19 % — ABNORMAL HIGH (ref 11.2–14.5)
RETIC CT ABS: 83.16 10*3/uL (ref 33.70–90.70)
Retic %: 2.8 % — ABNORMAL HIGH (ref 0.70–2.10)
WBC: 3.9 10*3/uL (ref 3.9–10.3)

## 2017-09-05 LAB — COMPREHENSIVE METABOLIC PANEL
ALT: 8 U/L (ref 0–55)
AST: 15 U/L (ref 5–34)
Albumin: 3.4 g/dL — ABNORMAL LOW (ref 3.5–5.0)
Alkaline Phosphatase: 51 U/L (ref 40–150)
Anion Gap: 9 mEq/L (ref 3–11)
BILIRUBIN TOTAL: 2.31 mg/dL — AB (ref 0.20–1.20)
BUN: 17 mg/dL (ref 7.0–26.0)
CHLORIDE: 106 meq/L (ref 98–109)
CO2: 22 meq/L (ref 22–29)
Calcium: 8.5 mg/dL (ref 8.4–10.4)
Creatinine: 0.9 mg/dL (ref 0.6–1.1)
GLUCOSE: 88 mg/dL (ref 70–140)
POTASSIUM: 4.5 meq/L (ref 3.5–5.1)
SODIUM: 137 meq/L (ref 136–145)
TOTAL PROTEIN: 6.8 g/dL (ref 6.4–8.3)

## 2017-09-05 LAB — LACTATE DEHYDROGENASE: LDH: 252 U/L — AB (ref 125–245)

## 2017-09-05 LAB — TECHNOLOGIST REVIEW

## 2017-09-05 NOTE — Progress Notes (Signed)
Please see the Nurse Progress Note in the MD Initial Consult Encounter for this patient. 

## 2017-09-05 NOTE — Progress Notes (Signed)
Radiation Oncology         ([phone]) [phone] ________________________________  Name: Michelle Cohen        MRN: [id number]  Date of Service: 09/05/2017 DOB: [birth year]  SE:GBTDVVO, No Pcp Per  Brunetta Genera, MD     REFERRING PHYSICIAN: Brunetta Genera, MD   DIAGNOSIS: The encounter diagnosis was Splenic marginal zone b-cell lymphoma ([place]).   HISTORY OF PRESENT ILLNESS: Michelle Cohen is a 66 y.o. female seen at the request of Dr. Irene Limbo for a splenic marginal zone B-Cell NHL who was diagnosed after presenting with a left inguinal hernia following a transcontinental flight in September 2018. She was seen at [facility] after disembarking and was found to have an incarcerated left inguinal hernia. She underwent herniorrhaphy in September 2018 despite several reductions in her hernia.  Her CT showed massive splenomegaly measuring up to 31 cm and lymphadenopathy in addition to the hernia. She had flow cytometry performed and a bone marrow biopsy which revealed hypercellularity with prominent interstitial infiltrate of small lymphocytes. She  and returned back home 2 weeks after surgery. As an aside, she's from [place], [place], and relocated to [place] to be with her sister who has since passed from lung cancer and who was also treated by Dr. Lisbeth Renshaw. She returned to [facility] and established her care with Dr. Irene Limbo and has received 4 infusions of Rituximab weekly, which began on 07/21/17, and she completed on 08/11/17. She has been transfusion dependant with pancytopenia.     PREVIOUS RADIATION THERAPY: No   PAST MEDICAL HISTORY:  Past Medical History:  Diagnosis Date  . B-cell lymphoma ([place]) 06/2017       PAST SURGICAL HISTORY: Past Surgical History:  Procedure Laterality Date  . left inguinal hernia repair Left      FAMILY HISTORY:  Family History  Problem Relation Age of Onset  . Lung cancer Sister      SOCIAL HISTORY:  reports that  has never smoked. she  has never used smokeless tobacco. She reports that she drinks alcohol. She reports that she does not use drugs. The patient is accompanied by her partner Caren Griffins. She retired from working as an Estate manager/land agent.    ALLERGIES: Patient has no known allergies.   MEDICATIONS:  Current Outpatient Medications  Medication Sig Dispense Refill  . acetaminophen (TYLENOL) 500 MG tablet Take 500 mg by mouth every 8 (eight) hours as needed.    . docusate sodium (COLACE) 100 MG capsule Take 100 mg by mouth 2 (two) times daily.    . folic acid (FOLVITE) 1 MG tablet Take 1 tablet (1 mg total) by mouth daily. 30 tablet 2  . LORazepam (ATIVAN) 0.5 MG tablet Take 1 tablet (0.5 mg total) by mouth every 8 (eight) hours as needed for anxiety. 30 tablet 0  . magic mouthwash w/lidocaine SOLN Take 5 mLs 4 (four) times daily as needed by mouth for mouth pain. Swish and spit 120 mL 0  . senna (SENOKOT) 8.6 MG TABS tablet Take 1 tablet by mouth.    Marland Kitchen acyclovir (ZOVIRAX) 200 MG capsule Take 2 capsules (400 mg total) 2 (two) times daily by mouth. (Patient not taking: Reported on 09/05/2017) 60 capsule 0   No current facility-administered medications for this encounter.      REVIEW OF SYSTEMS: On review of systems, the patient reports that she is doing well overall but notes early satiety, abdominal fullness, pelvic discomfort after eating, and discomfort in her low back. She  notes the back discomfort more so in the left lower back. She denies any chest pain, shortness of breath, cough, fevers, chills, night sweats, unintended weight changes. She denies any bowel or bladder disturbances, and denies abdominal pain, nausea or vomiting. She denies any other musculoskeletal or joint aches or pains. A complete review of systems is obtained and is otherwise negative.     PHYSICAL EXAM:  Wt Readings from Last 3 Encounters:  09/05/17 144 lb (65.3 kg)  08/24/17 144 lb 3.2 oz (65.4 kg)  08/11/17 141 lb 14.4 oz (64.4 kg)    Temp Readings from Last 3 Encounters:  09/05/17 (!) 97.1 F (36.2 C) (Oral)  08/24/17 98.9 F (37.2 C) (Oral)  08/14/17 97.9 F (36.6 C) (Oral)   BP Readings from Last 3 Encounters:  09/05/17 (!) 114/55  08/24/17 104/73  08/14/17 118/66   Pulse Readings from Last 3 Encounters:  09/05/17 81  08/24/17 97  08/14/17 81   Pain Assessment Pain Score: 4  Pain Loc: Back(where spleen is discomfort)/10  In general this is a well appearing caucasian female in no acute distress. She is alert and oriented x4 and appropriate throughout the examination. HEENT reveals that the patient is normocephalic, atraumatic. EOMs are intact. PERRLA. Skin is intact without any evidence of gross lesions. Cardiovascular exam reveals a regular rate and rhythm, no clicks rubs or murmurs are auscultated. Chest is clear to auscultation bilaterally. Lymphatic assessment is performed and does not reveal any adenopathy in the cervical, supraclavicular, axillary, or inguinal chains. Abdomen has active bowel sounds in all quadrants and is intact. The abdomen is tight and distended, non tender to very light palpation. She has a well healed left groin incision. Lower extremities are negative for pretibial pitting edema, deep calf tenderness, cyanosis or clubbing.   ECOG = 1  0 - Asymptomatic (Fully active, able to carry on all predisease activities without restriction)  1 - Symptomatic but completely ambulatory (Restricted in physically strenuous activity but ambulatory and able to carry out work of a light or sedentary nature. For example, light housework, office work)  2 - Symptomatic, <50% in bed during the day (Ambulatory and capable of all self care but unable to carry out any work activities. Up and about more than 50% of waking hours)  3 - Symptomatic, >50% in bed, but not bedbound (Capable of only limited self-care, confined to bed or chair 50% or more of waking hours)  4 - Bedbound (Completely disabled.  Cannot carry on any self-care. Totally confined to bed or chair)  5 - Death   Eustace Pen MM, Creech RH, Tormey DC, et al. [phone]). "Toxicity and response criteria of the [facility]". [place] Oncol. 5 (6): 649-55    LABORATORY DATA:  Lab Results  Component Value Date   WBC 3.9 09/05/2017   HGB 8.5 (L) 09/05/2017   HCT 27.2 (L) 09/05/2017   MCV 91.6 09/05/2017   PLT 78 (L) 09/05/2017   Lab Results  Component Value Date   NA 137 09/05/2017   K 4.5 09/05/2017   CO2 22 09/05/2017   Lab Results  Component Value Date   ALT 8 09/05/2017   AST 15 09/05/2017   ALKPHOS 51 09/05/2017   BILITOT 2.31 (H) 09/05/2017      RADIOGRAPHY: Ct Abdomen Pelvis W Contrast  Result Date: 08/23/2017 CLINICAL DATA:  Stage IV non-Hodgkin's lymphoma. EXAM: CT ABDOMEN AND PELVIS WITH CONTRAST TECHNIQUE: Multidetector CT imaging of the abdomen and pelvis was  performed using the standard protocol following bolus administration of intravenous contrast. CONTRAST:  18m ISOVUE-300 IOPAMIDOL (ISOVUE-300) INJECTION 61% COMPARISON:  PET-CT 07/25/2017 FINDINGS: Lower chest: Heart size upper normal. There is some atelectasis in the lower lungs bilaterally. Hepatobiliary: No focal abnormality within the liver parenchyma. There is no evidence for gallstones, gallbladder wall thickening, or pericholecystic fluid. No intrahepatic or extrahepatic biliary dilation. Pancreas: No focal mass lesion. No dilatation of the main duct. No intraparenchymal cyst. No peripancreatic edema. Spleen: Dramatic splenic enlargement again noted. Spleen measures 25.3 x 14.8 x 31.3 cm today which compares to 24.2 x 14.4 x 31.6 cm previously. This represents no substantial interval change in splenic volume calculated previously at 5770 cc. Adrenals/Urinary Tract: No adrenal nodule or mass. 6 mm hypoattenuating lesion interpolar right kidney is too small to characterize but is likely a cyst. Other tiny cortical lesions are seen  in the right kidney. No hydronephrosis. No evidence for hydroureter. The urinary bladder appears normal for the degree of distention. Stomach/Bowel: Stomach is nondistended. No gastric wall thickening. No evidence of outlet obstruction. Duodenum is normally positioned as is the ligament of Treitz. No small bowel wall thickening. No small bowel dilatation. The terminal ileum is normal. The appendix is normal. No gross colonic mass. No colonic wall thickening. No substantial diverticular change. Vascular/Lymphatic: There is abdominal aortic atherosclerosis without aneurysm. Borderline lymphadenopathy noted hepatoduodenal ligament with 13 mm short axis lymph nodes seen image 35 series 2 and there is a 15 mm short axis retrocaval lymph node visible on image 35 series 2. No para-aortic lymphadenopathy. No pelvic sidewall lymphadenopathy. Reproductive: The uterus has normal CT imaging appearance. There is no adnexal mass. Other: No substantial intraperitoneal free fluid. Musculoskeletal: Tiny left groin hernia contains fat and a trace amount of fluid. Bone windows reveal no worrisome lytic or sclerotic osseous lesions. IMPRESSION: 1. Marked splenomegaly without substantial interval change in splenic size/volume. 2. Borderline lymphadenopathy in the hepatoduodenal ligament. 3.  Aortic Atherosclerois (ICD10-170.0) Electronically Signed   By: EMisty StanleyM.D.   On: 08/23/2017 11:06       IMPRESSION/PLAN: 1. B-cell Non-Hodgkins Lymphoma involving the spleen. Dr. MLisbeth Renshawdiscusses the findings and her work up and course to date. Dr. MLisbeth Renshawdiscusses the findings from her CT scan and proceeding surgically could certainly address her large spleen in a quicker fashion. He also discusses that for patients who wish to avoid surgery, that radiation could be considered. He reviews the options of radiotherapy to the whole spleen, and discusses that vaccination would be necessary to consider as would be considered with splenectomy.  We discussed the risks, benefits, short, and long term effects of radiotherapy, and the patient is interested in proceeding. Dr. MLisbeth Renshawdiscusses the delivery and logistics of radiotherapy and anticipates a course of daily radiation to total 30 Gy to the whole spleen, given over about 3 weeks, and would hope for a 20-30% reduction in size of her spleen, but perhaps more. She is going to consider the discussion, and follow up with Dr. KIrene Limboon 09/14/17, and let uKoreaknow how she'd like to proceed.  The above documentation reflects my direct findings during this shared patient visit. Please see the separate note by Dr. MLisbeth Renshawon this date for the remainder of the patient's plan of care.    ACarola Rhine PAC

## 2017-09-13 MED FILL — FOLIC ACID 1 MG TABLET: 1 | 30 days supply | Qty: 30 | Fill #2

## 2017-09-14 ENCOUNTER — Other Ambulatory Visit: Payer: Self-pay

## 2017-09-14 ENCOUNTER — Encounter: Payer: Self-pay | Admitting: Hematology

## 2017-09-14 ENCOUNTER — Ambulatory Visit (HOSPITAL_BASED_OUTPATIENT_CLINIC_OR_DEPARTMENT_OTHER): Payer: Self-pay | Admitting: Hematology

## 2017-09-14 ENCOUNTER — Other Ambulatory Visit (HOSPITAL_BASED_OUTPATIENT_CLINIC_OR_DEPARTMENT_OTHER): Payer: Self-pay

## 2017-09-14 ENCOUNTER — Telehealth: Payer: Self-pay | Admitting: Hematology

## 2017-09-14 VITALS — BP 120/65 | HR 83 | Temp 98.8°F | Resp 18 | Ht 66.0 in | Wt 144.9 lb

## 2017-09-14 DIAGNOSIS — D709 Neutropenia, unspecified: Secondary | ICD-10-CM

## 2017-09-14 DIAGNOSIS — D702 Other drug-induced agranulocytosis: Secondary | ICD-10-CM

## 2017-09-14 DIAGNOSIS — D72829 Elevated white blood cell count, unspecified: Secondary | ICD-10-CM

## 2017-09-14 DIAGNOSIS — D649 Anemia, unspecified: Secondary | ICD-10-CM

## 2017-09-14 DIAGNOSIS — R161 Splenomegaly, not elsewhere classified: Secondary | ICD-10-CM

## 2017-09-14 DIAGNOSIS — Z23 Encounter for immunization: Secondary | ICD-10-CM

## 2017-09-14 DIAGNOSIS — C8307 Small cell B-cell lymphoma, spleen: Secondary | ICD-10-CM

## 2017-09-14 DIAGNOSIS — D701 Agranulocytosis secondary to cancer chemotherapy: Secondary | ICD-10-CM

## 2017-09-14 DIAGNOSIS — D61818 Other pancytopenia: Secondary | ICD-10-CM

## 2017-09-14 DIAGNOSIS — D696 Thrombocytopenia, unspecified: Secondary | ICD-10-CM

## 2017-09-14 LAB — CBC & DIFF AND RETIC
BASO%: 0.3 % (ref 0.0–2.0)
Basophils Absolute: 0 10*3/uL (ref 0.0–0.1)
EOS%: 0.3 % (ref 0.0–7.0)
Eosinophils Absolute: 0 10*3/uL (ref 0.0–0.5)
HCT: 29 % — ABNORMAL LOW (ref 34.8–46.6)
HGB: 9.1 g/dL — ABNORMAL LOW (ref 11.6–15.9)
Immature Retic Fract: 14.6 % — ABNORMAL HIGH (ref 1.60–10.00)
LYMPH%: 63.1 % — AB (ref 14.0–49.7)
MCH: 28.9 pg (ref 25.1–34.0)
MCHC: 31.4 g/dL — AB (ref 31.5–36.0)
MCV: 92.1 fL (ref 79.5–101.0)
MONO#: 0.3 10*3/uL (ref 0.1–0.9)
MONO%: 9.7 % (ref 0.0–14.0)
NEUT%: 26.6 % — ABNORMAL LOW (ref 38.4–76.8)
NEUTROS ABS: 0.9 10*3/uL — AB (ref 1.5–6.5)
PLATELETS: 46 10*3/uL — AB (ref 145–400)
RBC: 3.15 10*6/uL — AB (ref 3.70–5.45)
RDW: 18.2 % — ABNORMAL HIGH (ref 11.2–14.5)
Retic %: 2.83 % — ABNORMAL HIGH (ref 0.70–2.10)
Retic Ct Abs: 89.15 10*3/uL (ref 33.70–90.70)
WBC: 3.2 10*3/uL — AB (ref 3.9–10.3)
lymph#: 2 10*3/uL (ref 0.9–3.3)

## 2017-09-14 LAB — COMPREHENSIVE METABOLIC PANEL
ALK PHOS: 50 U/L (ref 40–150)
ALT: 7 U/L (ref 0–55)
AST: 13 U/L (ref 5–34)
Albumin: 3.5 g/dL (ref 3.5–5.0)
Anion Gap: 10 mEq/L (ref 3–11)
BILIRUBIN TOTAL: 2.23 mg/dL — AB (ref 0.20–1.20)
BUN: 13.5 mg/dL (ref 7.0–26.0)
CALCIUM: 8.7 mg/dL (ref 8.4–10.4)
CO2: 23 mEq/L (ref 22–29)
Chloride: 105 mEq/L (ref 98–109)
Creatinine: 0.9 mg/dL (ref 0.6–1.1)
Glucose: 126 mg/dl (ref 70–140)
POTASSIUM: 4.6 meq/L (ref 3.5–5.1)
Sodium: 138 mEq/L (ref 136–145)
TOTAL PROTEIN: 6.5 g/dL (ref 6.4–8.3)

## 2017-09-14 MED ORDER — MENINGOCOCCAL A C Y&W-135 OLIG IM SOLR
0.5000 mL | Freq: Once | INTRAMUSCULAR | Status: AC
  Administered 2017-09-14: 0.5 mL via INTRAMUSCULAR
  Filled 2017-09-14: qty 0.5

## 2017-09-14 MED ORDER — HAEMOPHILUS B POLYSAC CONJ VAC IM SOLR
0.5000 mL | Freq: Once | INTRAMUSCULAR | Status: AC
  Administered 2017-09-14: 0.5 mL via INTRAMUSCULAR
  Filled 2017-09-14: qty 0.5

## 2017-09-14 MED ORDER — MENINGOCOCCAL VAC B (OMV) IM SUSY: 0.5000 mL | PREFILLED_SYRINGE | Freq: Once | INTRAMUSCULAR | Status: DC

## 2017-09-14 MED ORDER — FOLIC ACID 1 MG PO TABS: 1.0000 mg | ORAL_TABLET | Freq: Every day | ORAL | 2 refills | Status: DC

## 2017-09-14 MED ORDER — HAEMOPHILUS B POLYSAC CONJ VAC IM SOLR: 0.5000 mL | Freq: Once | INTRAMUSCULAR | Status: DC

## 2017-09-14 MED ORDER — PNEUMOCOCCAL 13-VAL CONJ VACC IM SUSP
0.5000 mL | Freq: Once | INTRAMUSCULAR | Status: AC
  Administered 2017-09-14: 0.5 mL via INTRAMUSCULAR
  Filled 2017-09-14: qty 0.5

## 2017-09-14 MED ORDER — MENINGOCOCCAL A C Y&W-135 OLIG IM SOLR: 0.5000 mL | Freq: Once | INTRAMUSCULAR | Status: DC

## 2017-09-14 MED ORDER — PNEUMOCOCCAL 13-VAL CONJ VACC IM SUSP: 0.5000 mL | Freq: Once | INTRAMUSCULAR | Status: DC

## 2017-09-14 MED ORDER — LORAZEPAM 0.5 MG PO TABS: 0.5000 mg | ORAL_TABLET | Freq: Three times a day (TID) | ORAL | 0 refills | Status: DC | PRN

## 2017-09-14 MED FILL — LORazepam 0.5 MG TABS: 0.5 | 10 days supply | Qty: 30 | Fill #0

## 2017-09-14 NOTE — Patient Instructions (Signed)
VACCINATIONS GIVEN TODAY IN EXPECTATION OF SPLENECTOMY: ActHIB, Prevnar, and Menveo. EXPECTED VACCINATIONS IN TWO MONTHS: Pneumovax and Bexsero  Haemophilus influenzae type b Conjugate Vaccine injection What is this medicine? HAEMOPHILUS INFLUENZAE TYPE B CONJUGATE VACCINE (hem OFF fil [place] in floo En zuh type B KAN ji get VAK seen) is used to prevent infections of a Haemophilus bacteria. This medicine may be used for other purposes; ask your health care provider or pharmacist if you have questions. COMMON BRAND NAME(S): ActHIB, Hiberix, HibTITER, PedvaxHIB What should I tell my health care provider before I take this medicine? They need to know if you have any of these conditions: -bleeding disorder -Guillain-Barre syndrome -immune system problems -infection with fever -low levels of platelets in the blood -take medicines that treat or prevent blood clots -an unusual or allergic reaction to vaccines, other medicines, foods, dyes, or preservatives -pregnant or trying to get pregnant -breast-feeding How should I use this medicine? This vaccine is for injection into a muscle. It is given by a health care professional. A copy of Vaccine Information Statements will be given before each vaccination. Read this sheet carefully each time. The sheet may change frequently. Talk to your pediatrician regarding the use of this medicine in children. While this drug may be prescribed for children as young as 32 months old for selected conditions, precautions do apply. Overdosage: If you think you have taken too much of this medicine contact a poison control center or emergency room at once. NOTE: This medicine is only for you. Do not share this medicine with others. What if I miss a dose? Keep appointments for follow-up (booster) doses as directed. It is important not to miss your dose. Call your doctor or health care professional if you are unable to keep an appointment. What may interact with this  medicine? -adalimumab -anakinra -infliximab -medicines that suppress your immune system -medicines that treat or prevent blood clots like warfarin, enoxaparin, and dalteparin -medicines to treat cancer This list may not describe all possible interactions. Give your health care provider a list of all the medicines, herbs, non-prescription drugs, or dietary supplements you use. Also tell them if you smoke, drink alcohol, or use illegal drugs. Some items may interact with your medicine. What should I watch for while using this medicine? Visit your doctor for regular check-ups as directed. This vaccine, like all vaccines, may not fully protect everyone. What side effects may I notice from receiving this medicine? Side effects that you should report to your doctor or health care professional as soon as possible: -allergic reactions like skin rash, itching or hives, swelling of the face, lips, or tongue -breathing problems -extreme changes in behavior -fever over 100 degrees F -pain, tingling, numbness in the hands or feet -seizures -unusually weak or tired Side effects that usually do not require medical attention (report to your doctor or health care professional if they continue or are bothersome): -aches or pains -bruising, pain, swelling at site where injected -diarrhea -headache -loss of appetite -low-grade fever of 100 degrees F or less -nausea, vomiting -sleepy This list may not describe all possible side effects. Call your doctor for medical advice about side effects. You may report side effects to FDA at 1-800-FDA-1088. Where should I keep my medicine? This drug is given in a hospital or clinic and will not be stored at home. NOTE: This sheet is a summary. It may not cover all possible information. If you have questions about this medicine, talk to your doctor, pharmacist,  or health care provider.  2018 Elsevier/Gold Standard (2014-01-13 13:43:01)  Pneumococcal Conjugate  Vaccine suspension for injection What is this medicine? PNEUMOCOCCAL VACCINE (NEU mo KOK al vak SEEN) is a vaccine used to prevent pneumococcus bacterial infections. These bacteria can cause serious infections like pneumonia, meningitis, and blood infections. This vaccine will lower your chance of getting pneumonia. If you do get pneumonia, it can make your symptoms milder and your illness shorter. This vaccine will not treat an infection and will not cause infection. This vaccine is recommended for infants and young children, adults with certain medical conditions, and adults 34 years or older. This medicine may be used for other purposes; ask your health care provider or pharmacist if you have questions. COMMON BRAND NAME(S): Prevnar, Prevnar 13 What should I tell my health care provider before I take this medicine? They need to know if you have any of these conditions: -bleeding problems -fever -immune system problems -an unusual or allergic reaction to pneumococcal vaccine, diphtheria toxoid, other vaccines, latex, other medicines, foods, dyes, or preservatives -pregnant or trying to get pregnant -breast-feeding How should I use this medicine? This vaccine is for injection into a muscle. It is given by a health care professional. A copy of Vaccine Information Statements will be given before each vaccination. Read this sheet carefully each time. The sheet may change frequently. Talk to your pediatrician regarding the use of this medicine in children. While this drug may be prescribed for children as young as 17 weeks old for selected conditions, precautions do apply. Overdosage: If you think you have taken too much of this medicine contact a poison control center or emergency room at once. NOTE: This medicine is only for you. Do not share this medicine with others. What if I miss a dose? It is important not to miss your dose. Call your doctor or health care professional if you are unable to  keep an appointment. What may interact with this medicine? -medicines for cancer chemotherapy -medicines that suppress your immune function -steroid medicines like prednisone or cortisone This list may not describe all possible interactions. Give your health care provider a list of all the medicines, herbs, non-prescription drugs, or dietary supplements you use. Also tell them if you smoke, drink alcohol, or use illegal drugs. Some items may interact with your medicine. What should I watch for while using this medicine? Mild fever and pain should go away in 3 days or less. Report any unusual symptoms to your doctor or health care professional. What side effects may I notice from receiving this medicine? Side effects that you should report to your doctor or health care professional as soon as possible: -allergic reactions like skin rash, itching or hives, swelling of the face, lips, or tongue -breathing problems -confused -fast or irregular heartbeat -fever over 102 degrees F -seizures -unusual bleeding or bruising -unusual muscle weakness Side effects that usually do not require medical attention (report to your doctor or health care professional if they continue or are bothersome): -aches and pains -diarrhea -fever of 102 degrees F or less -headache -irritable -loss of appetite -pain, tender at site where injected -trouble sleeping This list may not describe all possible side effects. Call your doctor for medical advice about side effects. You may report side effects to FDA at 1-800-FDA-1088. Where should I keep my medicine? This does not apply. This vaccine is given in a clinic, pharmacy, doctor's office, or other health care setting and will not be stored at home. NOTE:  This sheet is a summary. It may not cover all possible information. If you have questions about this medicine, talk to your doctor, pharmacist, or health care provider.  2018 Elsevier/Gold Standard (2014-06-19  10:27:27)  Meningococcal Diphtheria Toxoid Conjugate Vaccine What is this medicine? MENINGOCOCCAL DIPHTHERIA TOXOID CONJUGATE VACCINE (muh ning goh KOK kal dif THEER ee uh TOK soid KON juh geyt vak SEEN) is a vaccine to protect from bacterial meningitis. This vaccine does not contain live bacteria. It will not cause a meningitis. This medicine may be used for other purposes; ask your health care provider or pharmacist if you have questions. COMMON BRAND NAME(S): Menactra, Menveo What should I tell my health care provider before I take this medicine? They need to know if you have any of these conditions: -bleeding disorder -fever or infection -history of Guillain-Barre syndrome -immune system problems -an unusual or allergic reaction to diphtheria toxoid, meningococcal vaccine, latex, other medicines, foods, dyes, or preservatives -pregnant or trying to get pregnant -breast-feeding How should I use this medicine? This medicine is for injection into a muscle. It is given by a health care professional in a hospital or clinic setting. A copy of Vaccine Information Statements will be given before each vaccination. Read this sheet carefully each time. The sheet may change frequently. Talk to your pediatrician regarding the use of this medicine in children. While some brands of this drug may be prescribed for children as young as 103 months of age for selected conditions, precautions do apply. Overdosage: If you think you have taken too much of this medicine contact a poison control center or emergency room at once. NOTE: This medicine is only for you. Do not share this medicine with others. What if I miss a dose? This does not apply. What may interact with this medicine? -adalimumab -anakinra -infliximab -medicines for organ transplant -medicines to treat cancer -medicines used during some procedures to diagnose a medical condition -other vaccines -some medicines for arthritis -steroid  medicines like prednisone or cortisone This list may not describe all possible interactions. Give your health care provider a list of all the medicines, herbs, non-prescription drugs, or dietary supplements you use. Also tell them if you smoke, drink alcohol, or use illegal drugs. Some items may interact with your medicine. What should I watch for while using this medicine? Report any side effects that are worrisome to your doctor right away. Call your doctor if you have any unusual symptoms within 6 weeks of getting this vaccine. This vaccine may not protect from all meningitis infections. Women should inform their doctor if they wish to become pregnant or think they might be pregnant. Talk to your health care professional or pharmacist for more information. What side effects may I notice from receiving this medicine? Side effects that you should report to your doctor or health care professional as soon as possible: -allergic reactions like skin rash, itching or hives, swelling of the face, lips, or tongue -breathing problems -feeling faint or lightheaded, falls -fever over 102 degrees F -muscle weakness -unusual drooping or paralysis of face Side effects that usually do not require medical attention (report to your doctor or health care professional if they continue or are bothersome): -chills -diarrhea -headache -loss of appetite -muscle aches and pains -pain at site where injected -tired This list may not describe all possible side effects. Call your doctor for medical advice about side effects. You may report side effects to FDA at 1-800-FDA-1088. Where should I keep my medicine? This drug  is given in a hospital or clinic and will not be stored at home. NOTE: This sheet is a summary. It may not cover all possible information. If you have questions about this medicine, talk to your doctor, pharmacist, or health care provider.  2018 Elsevier/Gold Standard (2010-02-02 21:41:10)

## 2017-09-14 NOTE — Telephone Encounter (Signed)
Gave avs and calendar for January also faxed referral

## 2017-09-14 NOTE — Progress Notes (Signed)
HEMATOLOGY/ONCOLOGY FOLLOW UP NOTE  Date of Service: 09/14/2017  Patient Care Team: Patient, No Pcp Per as PCP - General (General Practice)  CHIEF COMPLAINTS/PURPOSE OF INITIAL CONSULTATION:  Splenic marginal zone B-cell Non-Hodgkin's lymphoma   HISTORY OF PRESENTING ILLNESS:   Michelle Cohen is a wonderful 66 y.o. female who has been referred to [place] from [place] [facility] for evaluation and management of B-cell lymphoma. She presents to her appointment today with her partner of 21 years. She states that she moved to the area ~2 years ago secondary to her sister's diagnosis of lung cancer (unfortunately, now passed) and taking care of her mother's Alzheimer's. Generally, prior to this issue she has been very healthy and without chronic medical conditions and not on chronic medical therapies/treatments.   Initially, the patient was admitted to [facility] for abdominal pain and significant hernia. She reports that she was visiting in [place] and while flying there she had an acute onset of sweats and left lower quadrant pain. She reports that her hernia was not present at that time, but as she continued to fly her hernia had become more noticeable and enlarged in her lower abdomen. Additionally, her partner reports that her episode of hydrosis was very significant, stating that it was pooling below her. On their arrival to [facility] on 06/21/17, a CT A/P was performed which showed an incarcerated hernia. Of note, her CT showed massive splenomegaly at 32cm in greatest dimension. Manual reduction of her hernia was attempted while in the ED but was unsuccessful. Pre-operative notes state that following intubation manual reduction was performed successfully and hernia was repaired laparoscopically. CT CAP was performed as well which was without the presence of additional surrounding LAD. She did have flow cytometry performed during her admission as well which showed B-cell lymphocytes  which were monoclonal. A bone marrow biopsy was also examined which showed that it was markedly hypercellular (~95%) with prominent interstitial infiltrate of small lymphocytes.   Prior to her diagnosis, she mostly felt typically well overall. She states that she did suffer from insomnia which she related to over-thinking at the time regarding her familial situation. She did experience some fatigue and weight loss with this as well. Her partner reports that she noticed her weight loss approximately 61moago, which she quantifies at approximately 30lbs since onset. Towards the end of this six month periods and just prior to the appearance of her hernia the patient began to notice some bothersome abdominal distension. She also noticed some significant fatigue and she would nap for longer periods following her daily workouts. Additionally, her partner would notice that she would have issues with breathing and this appeared irregular and more labored, especially at night while laying down. She also mentions that she did experience intermittent night sweats throughout this six month period as well. She denies lightheadedness, dizziness, abdominal pain, back pain, fever, chills, abnormal bruising/bleeding, bloody stools, epistaxis, hematuria, gingival bleeding, or other bleeding issues within this period. She has not received any blood transfusions and has not previously had a tattoo; she is unconcerned with prior HepC exposure.   Since her hernia surgery, she reports that she feels great symptomatically overall and much improved. She does have some issues with abdominal bloating and discomfort, but this has been manageable. No overt abdominal pain.   Throughout all of this, she has attempted to try to remain within good spirits. Losing her sister recently has been hard, but she has been working through it  with her family; however, her partner voices that they do not have strong social support in this area as most of  their support is in [place] where they previously lived. She has been working through her sister's death with grief counseling which has been helping her.    She has never previously smoked, but she reports some moderate second-hand smoke through her father at a younger age. Her father did die of a PE, but she is unaware of any other diagnoses of clotting disorders or cancers within her family. No h/o alcohol abuse, chemical/radiation exposures. She worked previously in Industrial/product designer.   CURRENT TREATMENT:  Rituximab weekly x4 beginning on 07/21/17- now completed   INTERIM HISTORY:   Michelle Cohen returns today for scheduled follow up of her SMZL. In the interim she has been doing relatively well. She is accompanied by her partner of 21 years, Michelle Cohen. She has been doing relatively well in the interim and she feels mostly the same as we last saw her. Her Hgb has showing improvement and is now 9.1, however, he platelets have dropped again to 46K. She reports that she overall feels that same in the interim. She does endorse some increased anxiety. She has been eating in small amounts. She does admit to some residual tightness in the abdomen and in the left-sided lower back, but this has remained unchanged overall. She has continued to have night sweats, these have also remained unchanged. Her energy levels have improved since we last saw her.  She met with an had a discussion with radiation oncology regarding palliative splenic RT but has some reservations regarding this. She is now agreeable to get a referral to surgery (Dr Barry Dienes) for consideration of splenectomy.  We discussed and she agreed to proceed with splenectomy regarding vaccination -ordered. We discussed option for additional RItuxan depending on decision regarding splenectomy.   On review of systems, pt denies fever, chills, rash, mouth sores, weight loss, decreased appetite, urinary complaints. Denies pain. Pt denies  nausea, vomiting. Pertinent positives are listed and detailed within the above HPI.   MEDICAL HISTORY:  Past Medical History:  Diagnosis Date  . B-cell lymphoma ([place]) 06/2017    SURGICAL HISTORY: Past Surgical History:  Procedure Laterality Date  . left inguinal hernia repair Left     SOCIAL HISTORY: Social History   Socioeconomic History  . Marital status: Single    Spouse name: Not on file  . Number of children: Not on file  . Years of education: Not on file  . Highest education level: Not on file  Social Needs  . Financial resource strain: Not on file  . Food insecurity - worry: Not on file  . Food insecurity - inability: Not on file  . Transportation needs - medical: Not on file  . Transportation needs - non-medical: Not on file  Occupational History  . Not on file  Tobacco Use  . Smoking status: Never Smoker  . Smokeless tobacco: Never Used  Substance and Sexual Activity  . Alcohol use: Yes    Comment: glass of wine occasionally  . Drug use: No  . Sexual activity: Not on file  Other Topics Concern  . Not on file  Social History Narrative  . Not on file    FAMILY HISTORY: Sister - lung cancer Mother -Alzheimers dementia  ALLERGIES:  has No Known Allergies.  MEDICATIONS:  Current Outpatient Medications  Medication Sig Dispense Refill  . acetaminophen (TYLENOL) 500 MG tablet Take 500  mg by mouth every 8 (eight) hours as needed.    . docusate sodium (COLACE) 100 MG capsule Take 100 mg by mouth 2 (two) times daily.    . folic acid (FOLVITE) 1 MG tablet Take 1 tablet (1 mg total) by mouth daily. 30 tablet 2  . LORazepam (ATIVAN) 0.5 MG tablet Take 1 tablet (0.5 mg total) by mouth every 8 (eight) hours as needed for anxiety. 30 tablet 0  . magic mouthwash w/lidocaine SOLN Take 5 mLs 4 (four) times daily as needed by mouth for mouth pain. Swish and spit 120 mL 0  . senna (SENOKOT) 8.6 MG TABS tablet Take 1 tablet by mouth.    Marland Kitchen acyclovir (ZOVIRAX) 200 MG  capsule Take 2 capsules (400 mg total) 2 (two) times daily by mouth. (Patient not taking: Reported on 09/05/2017) 60 capsule 0   No current facility-administered medications for this visit.     REVIEW OF SYSTEMS:    A 10+ POINT REVIEW OF SYSTEMS WAS OBTAINED including neurology, dermatology, psychiatry, cardiac, respiratory, lymph, extremities, GI, GU, Musculoskeletal, constitutional, breasts, reproductive, HEENT.  All pertinent positives are noted in the HPI.  All others are negative.  PHYSICAL EXAMINATION:  ECOG PERFORMANCE STATUS: 1 - Symptomatic but completely ambulatory . Vitals:   09/14/17 0921  BP: 120/65  Pulse: 83  Resp: 18  Temp: 98.8 F (37.1 C)  SpO2: 100%   Filed Weights   09/14/17 0921  Weight: 144 lb 14.4 oz (65.7 kg)   .Body mass index is 23.39 kg/m.  GENERAL:alert, in no acute distress and comfortable SKIN: no acute rashes, no significant lesions EYES: conjunctiva are pink and non-injected, sclera anicteric OROPHARYNX: MMM, no exudates, no oropharyngeal erythema or ulceration NECK: supple, no JVD LYMPH:  no palpable lymphadenopathy in the cervical, axillary or inguinal regions LUNGS: clear to auscultation b/l with normal respiratory effort HEART: regular rate & rhythm ABDOMEN:  normoactive bowel sounds, non tender. Massive splenomegaly. No TTP. Extremity: resolved 1+ pedal edema bilaterally.   PSYCH: alert & oriented x 3 with fluent speech NEURO: no focal motor/sensory deficits  LABORATORY DATA:  I have reviewed the data as listed  . CBC Latest Ref Rng & Units 09/14/2017 09/05/2017 08/24/2017  WBC 3.9 - 10.3 10e3/uL 3.2(L) 3.9 5.2  Hemoglobin 11.6 - 15.9 g/dL 9.1(L) 8.5(L) 8.7(L)  Hematocrit 34.8 - 46.6 % 29.0(L) 27.2(L) 27.9(L)  Platelets 145 - 400 10e3/uL 46(L) 78(L) 59(L)   . CBC    Component Value Date/Time   WBC 3.2 (L) 09/14/2017 0835   RBC 3.15 (L) 09/14/2017 0835   HGB 9.1 (L) 09/14/2017 0835   HCT 29.0 (L) 09/14/2017 0835   PLT 46  (L) 09/14/2017 0835   MCV 92.1 09/14/2017 0835   MCH 28.9 09/14/2017 0835   MCHC 31.4 (L) 09/14/2017 0835   RDW 18.2 (H) 09/14/2017 0835   LYMPHSABS 2.0 09/14/2017 0835   MONOABS 0.3 09/14/2017 0835   EOSABS 0.0 09/14/2017 0835   BASOSABS 0.0 09/14/2017 0835    . CMP Latest Ref Rng & Units 09/14/2017 09/05/2017 08/24/2017  Glucose 70 - 140 mg/dl 126 88 90  BUN 7.0 - 26.0 mg/dL 13.5 17.0 13.7  Creatinine 0.6 - 1.1 mg/dL 0.9 0.9 0.9  Sodium 136 - 145 mEq/L 138 137 138  Potassium 3.5 - 5.1 mEq/L 4.6 4.5 4.6  CO2 22 - 29 mEq/L '23 22 24  ' Calcium 8.4 - 10.4 mg/dL 8.7 8.5 9.0  Total Protein 6.4 - 8.3 g/dL 6.5 6.8 6.9  Total  Bilirubin 0.20 - 1.20 mg/dL 2.23(H) 2.31(H) 2.35(H)  Alkaline Phos 40 - 150 U/L 50 51 53  AST 5 - 34 U/L '13 15 14  ' ALT 0 - 55 U/L '7 8 8   ' Component     Latest Ref Rng & Units 07/13/2017  LDH     125 - 245 U/L 232  Hep C Virus Ab     0.0 - 0.9 s/co ratio <0.1  HIV Screen 4th Generation wRfx     Non Reactive Non Reactive  Hep B Core Ab, Tot     Negative Negative  Hepatitis B Surface Ag     Negative Negative   Component     Latest Ref Rng & Units 07/28/2017  LDH     125 - 245 U/L 200  Uric Acid, Serum     2.6 - 7.4 mg/dl 6.0  Phosphorus     2.5 - 4.5 mg/dL 3.8      RADIOGRAPHIC STUDIES: I have personally reviewed the radiological images as listed and agreed with the findings in the report. Ct Abdomen Pelvis W Contrast  Result Date: 08/23/2017 CLINICAL DATA:  Stage IV non-Hodgkin's lymphoma. EXAM: CT ABDOMEN AND PELVIS WITH CONTRAST TECHNIQUE: Multidetector CT imaging of the abdomen and pelvis was performed using the standard protocol following bolus administration of intravenous contrast. CONTRAST:  1104m ISOVUE-300 IOPAMIDOL (ISOVUE-300) INJECTION 61% COMPARISON:  PET-CT 07/25/2017 FINDINGS: Lower chest: Heart size upper normal. There is some atelectasis in the lower lungs bilaterally. Hepatobiliary: No focal abnormality within the liver parenchyma.  There is no evidence for gallstones, gallbladder wall thickening, or pericholecystic fluid. No intrahepatic or extrahepatic biliary dilation. Pancreas: No focal mass lesion. No dilatation of the main duct. No intraparenchymal cyst. No peripancreatic edema. Spleen: Dramatic splenic enlargement again noted. Spleen measures 25.3 x 14.8 x 31.3 cm today which compares to 24.2 x 14.4 x 31.6 cm previously. This represents no substantial interval change in splenic volume calculated previously at 5770 cc. Adrenals/Urinary Tract: No adrenal nodule or mass. 6 mm hypoattenuating lesion interpolar right kidney is too small to characterize but is likely a cyst. Other tiny cortical lesions are seen in the right kidney. No hydronephrosis. No evidence for hydroureter. The urinary bladder appears normal for the degree of distention. Stomach/Bowel: Stomach is nondistended. No gastric wall thickening. No evidence of outlet obstruction. Duodenum is normally positioned as is the ligament of Treitz. No small bowel wall thickening. No small bowel dilatation. The terminal ileum is normal. The appendix is normal. No gross colonic mass. No colonic wall thickening. No substantial diverticular change. Vascular/Lymphatic: There is abdominal aortic atherosclerosis without aneurysm. Borderline lymphadenopathy noted hepatoduodenal ligament with 13 mm short axis lymph nodes seen image 35 series 2 and there is a 15 mm short axis retrocaval lymph node visible on image 35 series 2. No para-aortic lymphadenopathy. No pelvic sidewall lymphadenopathy. Reproductive: The uterus has normal CT imaging appearance. There is no adnexal mass. Other: No substantial intraperitoneal free fluid. Musculoskeletal: Tiny left groin hernia contains fat and a trace amount of fluid. Bone windows reveal no worrisome lytic or sclerotic osseous lesions. IMPRESSION: 1. Marked splenomegaly without substantial interval change in splenic size/volume. 2. Borderline lymphadenopathy  in the hepatoduodenal ligament. 3.  Aortic Atherosclerois (ICD10-170.0) Electronically Signed   By: EMisty StanleyM.D.   On: 08/23/2017 11:06   ASSESSMENT & PLAN:   DIsadora Deloreyis a wonderful 66y.o. caucasian female who presents to our clinic to discuss ongoing management of the following:  1. 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  Her clinical presentation is also representative of this and that this may have been present over months to possibly years.  -With her pattern of involvement and results thus far it is likely that she has splenic marginal zone type lymphoma.  -PET scan on 07/25/2017 with results showing: Massive splenomegaly, splenic volume 5770 cubic cm, with homogeneous low grade activity throughout the spleen equal to that of the physiologic activity in the liver, and above the background blood pool activity in the mediastinum. No pathologic adenopathy identified. No bony involvement noted.   -CT A/p on 08/23/17 with results of: Marked splenomegaly without substantial interval change in splenic size/volume. Borderline lymphadenopathy in the hepatoduodenal ligament. Aortic Atherosclerosis. I discussed this with the patient in great detail.  -She is having improved reticulocytosis and has shown less suppression than previously with her treatment, I expect her anemia and other blood counts will continue to improve with this over the next several weeks.   -However she spleen is still massive and is limiting improving in blood counts and causing moderate symptoms. - She did follow up with radiation oncology to discuss the option of radiotherapy. They did offer her a course to the  spleen, however, she wanted to discuss this during her appointment today. I again discussed this with her in great detail.  - I will also refer her to surgery (Dr Alice Reichert evaluation for splenectomy -We will initiate splenectomy related vaccinations prophylaxis.  --if she chooses not to proceed with splenectomy and to take a more conservative approach to treatment of her SMZL/splenomegaly. I offered 4 more doses of Rituxan weekly followed by maintenance Rituxan q2 months to further shrink the spleen.   2. 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  -Hgb has improved if this falls below <7 we will consider blood transfusion.  -Hgb has improved to 9.1 today, 09/14/17.  Plan -Anemia has continued to improve with treatment and PRBC infusion. We will monitor.  -neutropenia ANC 900 -- recommended infection precautions. -cannot safely use neulasta due to splenic size and risk for splenic rupture . Will need appropriate antibiotic prophylaxis around the time of splenectomy.  3. Rituxan hypersensitivity reaction - infusion rate related Had some muscle cramping and mild SOB needing additional steroids, benadryl and pepacid and Albuterol. Had to complete Rituxan at lower rate. Patient would not be a candidate for rapid infusion protocol and would need to keep the next infusion at the tolerate rate and not rate escalate. -added pepcid, steroids and singulair to premedications and then tolerated treatment better. -Advised the patient to use anti-histamine, Zyrtec or Claritin in between treatments  -She did have a more severe infusion reaction last time, we front loaded her with IV Solumedrol as her steroid and Ativan for her fourth infusion and she tolerated her last infusion very well.   Plan -Labs stable. No treatment-related prohibitive toxicity.   4. Oral mucositis/HSV outbreak --  resolved -continue with acyclovir for prophylaxis -this has been improving throughout treatment and is now resolved.   Vaccines today Referral to surgery - Stark Klein for consideration of splenectomy Rituxan weekly x  4 doses in 2nd week of January 2019 after surgery appointment if splenectomy not considered. RTC with Dr Irene Limbo with labs in 2nd week of Jan 2019 with schedule dose of Rituxan  All of the patients questions were answered with apparent satisfaction. The patient knows to call the clinic with any problems, questions or concerns.  I spent 30 minutes counseling the patient face to face. The total time spent in the appointment was 40 minutes and more than 50% was on counseling and direct patient cares.  Sullivan Lone MD [place] AAHIVMS [facility] Hematology/Oncology Physician [facility]  (Office):       [phone] (Work cell):  [phone] (Fax):           [phone]  09/14/2017 10:14 AM  This document serves as a record of services personally performed by Sullivan Lone, MD. It was created on his behalf by Reola Mosher, a trained medical scribe. The creation of this record is based on the scribe's personal observations and the provider's statements to them.   .I have reviewed the above documentation for accuracy and completeness, and I agree with the above. Brunetta Genera MD MS

## 2017-09-15 ENCOUNTER — Encounter (HOSPITAL_COMMUNITY): Payer: Self-pay

## 2017-09-15 ENCOUNTER — Telehealth: Payer: Self-pay | Admitting: *Deleted

## 2017-09-15 ENCOUNTER — Other Ambulatory Visit: Payer: Self-pay

## 2017-09-15 ENCOUNTER — Emergency Department (HOSPITAL_COMMUNITY): Payer: Self-pay

## 2017-09-15 ENCOUNTER — Emergency Department (HOSPITAL_COMMUNITY)
Admission: EM | Admit: 2017-09-15 | Discharge: 2017-09-15 | Disposition: A | Discharge status: Home / Self Care | Payer: Self-pay | Attending: Emergency Medicine | Admitting: Emergency Medicine

## 2017-09-15 DIAGNOSIS — R161 Splenomegaly, not elsewhere classified: Secondary | ICD-10-CM | POA: Insufficient documentation

## 2017-09-15 DIAGNOSIS — R1032 Left lower quadrant pain: Secondary | ICD-10-CM | POA: Insufficient documentation

## 2017-09-15 DIAGNOSIS — Z79899 Other long term (current) drug therapy: Secondary | ICD-10-CM | POA: Insufficient documentation

## 2017-09-15 DIAGNOSIS — C8590 Non-Hodgkin lymphoma, unspecified, unspecified site: Secondary | ICD-10-CM | POA: Insufficient documentation

## 2017-09-15 LAB — COMPREHENSIVE METABOLIC PANEL
ALT: 10 U/L — ABNORMAL LOW (ref 14–54)
ANION GAP: 7 (ref 5–15)
AST: 17 U/L (ref 15–41)
Albumin: 3.8 g/dL (ref 3.5–5.0)
Alkaline Phosphatase: 45 U/L (ref 38–126)
BUN: 16 mg/dL (ref 6–20)
CHLORIDE: 106 mmol/L (ref 101–111)
CO2: 24 mmol/L (ref 22–32)
CREATININE: 0.79 mg/dL (ref 0.44–1.00)
Calcium: 8.7 mg/dL — ABNORMAL LOW (ref 8.9–10.3)
GFR calc Af Amer: >60 mL/min (ref >60)
Glucose, Bld: 140 mg/dL — ABNORMAL HIGH (ref 65–99)
POTASSIUM: 4.4 mmol/L (ref 3.5–5.1)
SODIUM: 137 mmol/L (ref 135–145)
Total Bilirubin: 2.1 mg/dL — ABNORMAL HIGH (ref 0.3–1.2)
Total Protein: 7.1 g/dL (ref 6.5–8.1)

## 2017-09-15 LAB — CBC
HCT: 29.4 % — ABNORMAL LOW (ref 36.0–46.0)
Hemoglobin: 9.3 g/dL — ABNORMAL LOW (ref 12.0–15.0)
MCH: 28.6 pg (ref 26.0–34.0)
MCHC: 31.6 g/dL (ref 30.0–36.0)
MCV: 90.5 fL (ref 78.0–100.0)
PLATELETS: 62 10*3/uL — AB (ref 150–400)
RBC: 3.25 MIL/uL — ABNORMAL LOW (ref 3.87–5.11)
RDW: 18 % — AB (ref 11.5–15.5)
WBC: 4 10*3/uL (ref 4.0–10.5)

## 2017-09-15 LAB — LIPASE, BLOOD: LIPASE: 25 U/L (ref 11–51)

## 2017-09-15 LAB — URINALYSIS, ROUTINE W REFLEX MICROSCOPIC
BILIRUBIN URINE: NEGATIVE
Glucose, UA: NEGATIVE mg/dL
Hgb urine dipstick: NEGATIVE
Ketones, ur: NEGATIVE mg/dL
Leukocytes, UA: NEGATIVE
Nitrite: NEGATIVE
Protein, ur: NEGATIVE mg/dL
SPECIFIC GRAVITY, URINE: 1.018 (ref 1.005–1.030)
pH: 6 (ref 5.0–8.0)

## 2017-09-15 MED ORDER — MORPHINE SULFATE (PF) 4 MG/ML IV SOLN
4.0000 mg | Freq: Once | INTRAVENOUS | Status: AC
  Administered 2017-09-15: 4 mg via INTRAVENOUS
  Filled 2017-09-15: qty 1

## 2017-09-15 MED ORDER — IOPAMIDOL (ISOVUE-300) INJECTION 61%
INTRAVENOUS | Status: AC
  Administered 2017-09-15: 100 mL via INTRAVENOUS
  Filled 2017-09-15: qty 100

## 2017-09-15 MED ORDER — IOPAMIDOL (ISOVUE-300) INJECTION 61%
100.0000 mL | Freq: Once | INTRAVENOUS | Status: AC | PRN
  Administered 2017-09-15: 100 mL via INTRAVENOUS

## 2017-09-15 NOTE — ED Provider Notes (Signed)
Medical screening examination/treatment/procedure(s) were conducted as a shared visit with non-physician practitioner(s) and myself.  I personally evaluated the patient during the encounter.   EKG Interpretation None       Patient with a history of non-hitch Hodgkin's lymphoma.  Also has a history of a left lower quadrant hernia.  Patient was at the movies gets severe pain in that area.  Patient now comfortable.  CT scan is baseline for her.  Shows no significant acute abdominal process.  No evidence of bowel obstruction no evidence of hernia.  It may have reduced itself since patient feels much better.  Abdomen is distended.  Patient has a markedly enlarged spleen.  Patient otherwise in no acute distress nontoxic no fevers.  Lungs clear heart regular oxygen saturations in the upper 90s.  Patient stable for discharge home close follow-up with hematology oncology.   Fredia Sorrow, MD 09/15/17 [phone]

## 2017-09-15 NOTE — ED Notes (Signed)
Bed: WA03 Expected date:  Expected time:  Means of arrival:  Comments: Triage 1

## 2017-09-15 NOTE — ED Notes (Signed)
Per cancer center nurse-states patient coming from home-complaining of vomiting 3-4 times today-states it has aggrevated her hernia-states abdomin is rigid-coming to ED for further eval-SBO in past

## 2017-09-15 NOTE — Telephone Encounter (Signed)
Pt called c/o nausea with emesis x 4 this afternoon.  Pt has hx of inguinal hernia that is now hard/protruding again since emesis.  Dr. Irene Limbo concerned for new herniation/possible bowel obstruction.  Pt advised to present to emergency room.  This RN spoke with Erline Levine, charge RN in ED to advise of pt coming from home.  History provided.  Erline Levine verbalized understanding.

## 2017-09-15 NOTE — Discharge Instructions (Signed)
Your evaluation today is reassuring, CT scan shows no evidence of hernia, and labs look good, please follow-up with Dr. Irene Limbo as planned, if pain returns, or you have persistent nausea and vomiting, fevers or chills please return to the ED for sooner reevaluation.

## 2017-09-15 NOTE — ED Provider Notes (Signed)
[facility] DEPT Provider Note   CSN: [id number] Arrival date & time: 09/15/17  1628     History   Chief Complaint Chief Complaint  Patient presents with  . enlarged spleen  . Abdominal Pain    HPI  Michelle Cohen is a 66 y.o. Female with non hodgkin's lymphoma with massive splenomegaly, currently on Rituxan, presents complaining of nausea, vomiting and diarrhea earlier today, now with increasing left lower quadrant abdominal pain.  Patient reports she saw Dr. Rod Can her oncologist yesterday and called him today after increasing pain and he recommended she come here.  Patient has history of previous incisional hernia in this area, which in the past has been manually reduced.  Patient reports earlier today she went to the movies and ate some popcorn, and then vomited this up after the vomiting and the pain increased in her lower abdomen.  Pt has not had any additional episodes of vomiting, one episode of diarrhea, nonbloody.  Patient denies fevers or chills.  No chest pain or shortness of breath.      Past Medical History:  Diagnosis Date  . B-cell lymphoma ([place]) 06/2017    Patient Active Problem List   Diagnosis Date Noted  . Counseling regarding advanced care planning and goals of care 07/31/2017  . 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 left inguinal hernia repair Left     OB History    No data available       Home Medications    Prior to Admission medications   Medication Sig Start Date End Date Taking? Authorizing Provider  acetaminophen (TYLENOL) 500 MG tablet Take 500 mg by mouth every 8 (eight) hours as needed for moderate pain.    Yes [provider]  docusate sodium (COLACE) 100 MG capsule Take 100 mg by mouth 2 (two) times daily.   Yes [provider]  folic acid (FOLVITE) 1 MG tablet Take 1 tablet (1 mg total) by mouth daily. 09/14/17   Yes Brunetta Genera, MD  LORazepam (ATIVAN) 0.5 MG tablet Take 1 tablet (0.5 mg total) by mouth every 8 (eight) hours as needed for anxiety. 09/14/17  Yes Brunetta Genera, MD  magic mouthwash w/lidocaine SOLN Take 5 mLs 4 (four) times daily as needed by mouth for mouth pain. Swish and spit 08/08/17  Yes Brunetta Genera, MD  senna (SENOKOT) 8.6 MG TABS tablet Take 1 tablet by mouth daily as needed for mild constipation.    Yes [provider]  acyclovir (ZOVIRAX) 200 MG capsule Take 2 capsules (400 mg total) 2 (two) times daily by mouth. Patient not taking: Reported on 09/05/2017 08/04/17   Brunetta Genera, MD  allopurinol (ZYLOPRIM) 100 MG tablet Take 100 mg by mouth daily.  08/15/17   [provider]    Family History Family History  Problem Relation Age of Onset  . Lung cancer Sister     Social History Social History   Tobacco Use  . Smoking status: Never Smoker  . Smokeless tobacco: Never Used  Substance Use Topics  . Alcohol use: Yes    Comment: glass of wine occasionally  . Drug use: No     Allergies   Patient has no known allergies.   Review of Systems Review of Systems  Constitutional: Negative for chills and fever.  HENT: Negative.   Respiratory: Negative for cough, chest tightness and shortness of breath.  Cardiovascular: Negative for chest pain.  Gastrointestinal: Positive for abdominal distention, abdominal pain, diarrhea, nausea and vomiting. Negative for blood in stool and constipation.  Genitourinary: Negative for dysuria, flank pain and hematuria.  Musculoskeletal: Negative for arthralgias and myalgias.  Skin: Negative for rash.  Neurological: Negative for dizziness, seizures, weakness, numbness and headaches.     Physical Exam Updated Vital Signs BP 102/64   Pulse 85   Temp (!) 97.4 F (36.3 C) (Oral)   Resp 20   Ht 5\' 6"  (1.676 m)   Wt 65.3 kg (144 lb)   SpO2 97%   BMI 23.24 kg/m   Physical Exam    Constitutional: She appears well-developed and well-nourished. No distress.  HENT:  Head: Normocephalic and atraumatic.  Eyes: EOM are normal. Pupils are equal, round, and reactive to light. Right eye exhibits no discharge. Left eye exhibits no discharge.  Neck: Neck supple.  Cardiovascular: Normal rate, regular rhythm, normal heart sounds and intact distal pulses.  Pulmonary/Chest: Effort normal and breath sounds normal. No stridor. No respiratory distress. She has no wheezes. She has no rales.  Abdominal: Bowel sounds are normal. She exhibits distension. She exhibits no mass. There is no tenderness. There is no rebound and no guarding. No hernia.  Abdomen is nontender to palpation, with enlarged spleen palpable throughout abdomen, no tenderness or guarding, no rebound, no evidence of hernia over left lower quadrant incision, no palpable hernia with coughing or bearing down  Musculoskeletal: She exhibits no edema or deformity.  Neurological: She is alert. Coordination normal.  Speech is clear, able to follow commands Normal strength in upper and lower extremities bilaterally including dorsiflexion and plantar flexion, strong and equal grip strength Sensation normal to light and sharp touch Moves extremities without ataxia, coordination intact   Skin: Skin is warm and dry. Capillary refill takes less than 2 seconds. She is not diaphoretic.  Psychiatric: She has a normal mood and affect. Her behavior is normal.  Nursing note and vitals reviewed.    ED Treatments / Results  Labs (all labs ordered are listed, but only abnormal results are displayed) Labs Reviewed  CBC - Abnormal; Notable for the following components:      Result Value   RBC 3.25 (*)    Hemoglobin 9.3 (*)    HCT 29.4 (*)    RDW 18.0 (*)    Platelets 62 (*)    All other components within normal limits  COMPREHENSIVE METABOLIC PANEL - Abnormal; Notable for the following components:   Glucose, Bld 140 (*)    Calcium  8.7 (*)    ALT 10 (*)    Total Bilirubin 2.1 (*)    All other components within normal limits  URINALYSIS, ROUTINE W REFLEX MICROSCOPIC  LIPASE, BLOOD    EKG  EKG Interpretation None       Radiology Ct Abdomen Pelvis W Contrast  Result Date: 09/15/2017 CLINICAL DATA:  Splenic cancer nausea vomiting and diarrhea history of B-cell lymphoma EXAM: CT ABDOMEN AND PELVIS WITH CONTRAST TECHNIQUE: Multidetector CT imaging of the abdomen and pelvis was performed using the standard protocol following bolus administration of intravenous contrast. CONTRAST:  100 mL Isovue-300 intravenous COMPARISON:  08/23/2017 and 07/25/2017 FINDINGS: Lower chest: Lung bases demonstrate no consolidation or pleural effusion. Normal heart size. Hepatobiliary: No focal liver abnormality is seen. No gallstones, gallbladder wall thickening, or biliary dilatation. Pancreas: Unremarkable. No pancreatic ductal dilatation or surrounding inflammatory changes. Spleen: Massively enlarged, measuring 24.7 x 13.4 by 31.2, previous measurements of  25.3 x 14.8 x 31.3. Heterogenous enhancement. Adrenals/Urinary Tract: Adrenal glands are within normal limits. Prominent left renal pelvis. subcentimeter cortical hypodense lesions in the right kidney too small to further characterize. Bladder unremarkable Stomach/Bowel: The stomach is decompressed. Possible wall thickening at the pylorus. No dilated small bowel. Normal appendix. No colon wall thickening. Vascular/Lymphatic: Nonaneurysmal aorta. Re- demonstrated gastrohepatic lymph node measuring 12 mm and retrocaval lymph node measuring 15 mm. Reproductive: Uterus and bilateral adnexa are unremarkable. Other: Negative for free air or free fluid. Small left inguinal hernia containing fluid and fat. Significant mass effect from enlarged spleen with displacement of intra- abdominal organs and bowel to the right. Musculoskeletal: No acute or significant osseous findings. IMPRESSION: 1. Massive  splenomegaly without significant interval change compared to the previous exams. 2. Possible thickening in the region of pylorus, could be due to spasm or gastritis 3. Negative for bowel obstruction Electronically Signed   By: Donavan Foil M.D.   On: 09/15/2017 19:30    Procedures Procedures (including critical care time)  Medications Ordered in ED Medications  morphine 4 MG/ML injection 4 mg (4 mg Intravenous Given 09/15/17 1752)  iopamidol (ISOVUE-300) 61 % injection 100 mL (100 mLs Intravenous Contrast Given 09/15/17 1849)     Initial Impression / Assessment and Plan / ED Course  I have reviewed the triage vital signs and the nursing notes.  Pertinent labs & imaging results that were available during my care of the patient were reviewed by me and considered in my medical decision making (see chart for details).  With massive splenomegaly in the setting of non-Hodgkin's lymphoma, and history of left lower quadrant hernia, presents with acute onset severe left lower quadrant pain earlier today.  Reports one episode of vomiting and diarrhea, none since earlier this afternoon, no fevers or chills.  On arrival to the ED patient continues to be in severe pain, morphine ordered.  Abdominal labs and noncontrasted CT abdomen pelvis ordered.  Abdominal labs unremarkable hemoglobin is improved from baseline, no leukocytosis, kidney and liver function are at baseline, no evidence of urinary tract infection.  CT shows massive splenomegaly, no evidence of splenic bleeding, no evidence of bowel obstruction or hernia.  Or CT scan, patient reports she is much more comfortable, no longer feels hernia.  On exam of left lower quadrant, no palpable hernia with bearing down or coughing.  Patient is stable for discharge home, with follow-up with her oncologist, strict return precautions discussed.  Patient expressed understanding and is in agreement with plan.  Patient discussed with Dr. Rogene Houston, who saw  patient as well and agrees with plan.   Final Clinical Impressions(s) / ED Diagnoses   Final diagnoses:  Left lower quadrant pain  Splenomegaly    ED Discharge Orders    None       Jacqlyn Larsen, [place] 09/16/17 0131    Fredia Sorrow, MD 09/18/17 [phone]

## 2017-09-15 NOTE — ED Triage Notes (Signed)
Patient c/o spleen cancer. Patient states she has a 15 lb spleen and now is having a protruding hernia. Patient is having increased pain, N//V/D. Patient saw her chemo physician yesterday.

## 2017-09-18 ENCOUNTER — Telehealth: Payer: Self-pay | Admitting: Radiation Oncology

## 2017-09-18 NOTE — Telephone Encounter (Signed)
LM for the patient to see if she had made any decisions regarding radiation.

## 2017-09-29 ENCOUNTER — Other Ambulatory Visit: Payer: Self-pay | Admitting: General Surgery

## 2017-10-03 ENCOUNTER — Encounter: Payer: Self-pay | Admitting: Pharmacy Technician

## 2017-10-03 NOTE — Progress Notes (Unsigned)
The

## 2017-10-03 NOTE — Progress Notes (Signed)
HEMATOLOGY/ONCOLOGY FOLLOW UP NOTE  Date of Service: 10/04/2017  Patient Care Team: Patient, No Pcp Per as PCP - General (General Practice)  CHIEF COMPLAINTS/PURPOSE OF INITIAL CONSULTATION:  Splenic marginal zone B-cell Non-Hodgkin's lymphoma   HISTORY OF PRESENTING ILLNESS:   Michelle Cohen is a wonderful 67 y.o. female who has been referred to [place] from [place] [facility] for evaluation and management of B-cell lymphoma. She presents to her appointment today with her partner of 21 years. She states that she moved to the area ~2 years ago secondary to her sister's diagnosis of lung cancer (unfortunately, now passed) and taking care of her mother's Alzheimer's. Generally, prior to this issue she has been very healthy and without chronic medical conditions and not on chronic medical therapies/treatments.   Initially, the patient was admitted to [facility] for abdominal pain and significant hernia. She reports that she was visiting in [place] and while flying there she had an acute onset of sweats and left lower quadrant pain. She reports that her hernia was not present at that time, but as she continued to fly her hernia had become more noticeable and enlarged in her lower abdomen. Additionally, her partner reports that her episode of hydrosis was very significant, stating that it was pooling below her. On their arrival to [facility] on 06/21/17, a CT A/P was performed which showed an incarcerated hernia. Of note, her CT showed massive splenomegaly at 32cm in greatest dimension. Manual reduction of her hernia was attempted while in the ED but was unsuccessful. Pre-operative notes state that following intubation manual reduction was performed successfully and hernia was repaired laparoscopically. CT CAP was performed as well which was without the presence of additional surrounding LAD. She did have flow cytometry performed during her admission as well which showed B-cell lymphocytes  which were monoclonal. A bone marrow biopsy was also examined which showed that it was markedly hypercellular (~95%) with prominent interstitial infiltrate of small lymphocytes.   Prior to her diagnosis, she mostly felt typically well overall. She states that she did suffer from insomnia which she related to over-thinking at the time regarding her familial situation. She did experience some fatigue and weight loss with this as well. Her partner reports that she noticed her weight loss approximately 90moago, which she quantifies at approximately 30lbs since onset. Towards the end of this six month periods and just prior to the appearance of her hernia the patient began to notice some bothersome abdominal distension. She also noticed some significant fatigue and she would nap for longer periods following her daily workouts. Additionally, her partner would notice that she would have issues with breathing and this appeared irregular and more labored, especially at night while laying down. She also mentions that she did experience intermittent night sweats throughout this six month period as well. She denies lightheadedness, dizziness, abdominal pain, back pain, fever, chills, abnormal bruising/bleeding, bloody stools, epistaxis, hematuria, gingival bleeding, or other bleeding issues within this period. She has not received any blood transfusions and has not previously had a tattoo; she is unconcerned with prior HepC exposure.   Since her hernia surgery, she reports that she feels great symptomatically overall and much improved. She does have some issues with abdominal bloating and discomfort, but this has been manageable. No overt abdominal pain.   Throughout all of this, she has attempted to try to remain within good spirits. Losing her sister recently has been hard, but she has been working through it  with her family; however, her partner voices that they do not have strong social support in this area as most of  their support is in [place] where they previously lived. She has been working through her sister's death with grief counseling which has been helping her.    She has never previously smoked, but she reports some moderate second-hand smoke through her father at a younger age. Her father did die of a PE, but she is unaware of any other diagnoses of clotting disorders or cancers within her family. No h/o alcohol abuse, chemical/radiation exposures. She worked previously in Industrial/product designer.   PREVIOUS TREATMENT:  Rituximab weekly x4 beginning on 07/21/17-08/11/17   CURRENT TREATMENT:  Rituximab  Weekly for 4 weeks starting 10/05/17  INTERIM HISTORY:   Michelle Cohen returns today for scheduled follow up of her SMZL. She presents to the clinic today accompanied by a family member. She notes her CT AP from 09/15/17 in ED when she develop a painful incisional hernia which spontaneously reduced after pain medications. Splenomegaly very gradually shrinking.   She met with Dr. Barry Dienes who offered her a splenectomy.  Pt feels she would like to go the more conservative route and forego splenectomy for now since it will involve significant surgery and she feels hopeful that given the gradual shrinkage of the spleen that It will continue to improve.  The risk of infection and the all over large surgery concerns the pt to proceed with surgery at this time. She notes she is also worried of having an accident and her spleen rupturing given the enlargement. She notes she has gained some of her weight back and eating well. She has stopped acyclovir and only had used the magic mouth wash. She takes B12 complex, and prophylactic colace with Senakot to prevent constipation.    On review of symptoms, pt notes her night sweats has much improved and manageable now. She has mucositis for the last few days.    MEDICAL HISTORY:  Past Medical History:  Diagnosis Date  . B-cell lymphoma ([place]) 06/2017     SURGICAL HISTORY: Past Surgical History:  Procedure Laterality Date  . left inguinal hernia repair Left     SOCIAL HISTORY: Social History   Socioeconomic History  . Marital status: Single    Spouse name: Not on file  . Number of children: Not on file  . Years of education: Not on file  . Highest education level: Not on file  Social Needs  . Financial resource strain: Not on file  . Food insecurity - worry: Not on file  . Food insecurity - inability: Not on file  . Transportation needs - medical: Not on file  . Transportation needs - non-medical: Not on file  Occupational History  . Not on file  Tobacco Use  . Smoking status: Never Smoker  . Smokeless tobacco: Never Used  Substance and Sexual Activity  . Alcohol use: Yes    Comment: glass of wine occasionally  . Drug use: No  . Sexual activity: Not on file  Other Topics Concern  . Not on file  Social History Narrative  . Not on file    FAMILY HISTORY: Sister - lung cancer Mother -Alzheimers dementia  ALLERGIES:  has No Known Allergies.  MEDICATIONS:  Current Outpatient Medications  Medication Sig Dispense Refill  . acetaminophen (TYLENOL) 500 MG tablet Take 500 mg by mouth every 8 (eight) hours as needed for moderate pain.     Marland Kitchen acyclovir (  ZOVIRAX) 200 MG capsule Take 2 capsules (400 mg total) by mouth 2 (two) times daily. 60 capsule 0  . allopurinol (ZYLOPRIM) 100 MG tablet Take 100 mg by mouth daily.   2  . docusate sodium (COLACE) 100 MG capsule Take 100 mg by mouth 2 (two) times daily.    . folic acid (FOLVITE) 1 MG tablet Take 1 tablet (1 mg total) by mouth daily. 30 tablet 2  . LORazepam (ATIVAN) 0.5 MG tablet Take 1 tablet (0.5 mg total) by mouth every 8 (eight) hours as needed for anxiety. 30 tablet 0  . magic mouthwash w/lidocaine SOLN Take 5 mLs by mouth 4 (four) times daily as needed for mouth pain. Swish and spit 120 mL 0  . senna (SENOKOT) 8.6 MG TABS tablet Take 1 tablet by mouth daily as  needed for mild constipation.      No current facility-administered medications for this visit.     REVIEW OF SYSTEMS:    A 10+ POINT REVIEW OF SYSTEMS WAS OBTAINED including neurology, dermatology, psychiatry, cardiac, respiratory, lymph, extremities, GI, GU, Musculoskeletal, constitutional, breasts, reproductive, HEENT.  All pertinent positives are noted in the HPI.  All others are negative.  PHYSICAL EXAMINATION:  ECOG PERFORMANCE STATUS: 1 - Symptomatic but completely ambulatory . Vitals:   10/04/17 0846  BP: 113/68  Pulse: 78  Resp: 18  Temp: 97.9 F (36.6 C)  SpO2: 99%   Filed Weights   10/04/17 0846  Weight: 147 lb 6.4 oz (66.9 kg)   .Body mass index is 23.79 kg/m.  GENERAL:alert, in no acute distress and comfortable SKIN: no acute rashes, no significant lesions EYES: conjunctiva are pink and non-injected, sclera anicteric OROPHARYNX: MMM, no exudates, no oropharyngeal erythema or ulceration (+) mucositis  NECK: supple, no JVD LYMPH:  no palpable lymphadenopathy in the cervical, axillary or inguinal regions LUNGS: clear to auscultation b/l with normal respiratory effort HEART: regular rate & rhythm ABDOMEN:  normoactive bowel sounds, non tender. Massive splenomegaly. No TTP. Extremity: resolved 1+ pedal edema bilaterally.   PSYCH: alert & oriented x 3 with fluent speech NEURO: no focal motor/sensory deficits  LABORATORY DATA:  I have reviewed the data as listed  Component     Latest Ref Rng & Units 09/15/2017 10/04/2017  WBC Count     4.0 - 10.3 K/uL  3.4 (L)  RBC     3.70 - 5.45 MIL/uL 3.25 (L) 3.50 (L)  Hemoglobin     11.6 - 15.9 g/dL 9.3 (L) 10.6 (L)  HCT     34.8 - 46.6 % 29.4 (L) 30.4 (L)  MCV     79.5 - 101.0 fL 90.5 86.9  MCH     25.1 - 34.0 pg 28.6 30.3  MCHC     31.5 - 36.0 g/dL 31.6 34.8  RDW     11.2 - 16.1 % 18.0 (H) 18.3 (H)  Platelet Count     145 - 400 K/uL  58 (L)  Smear Review       ATYPICAL LYMPHS  Neutrophils     %  29   NEUT#     1.5 - 6.5 K/uL  1.0 (L)  Abs Granulocyte     1.5 - 6.5 K/uL  1.0 (L)  Lymphocytes     %  64  Lymphocyte #     0.9 - 3.3 K/uL  2.2  Monocytes Relative     %  6  Monocyte #     0.1 - 0.9 K/uL  0.2  Eosinophil     %  1  Eosinophils Absolute     0.0 - 0.5 K/uL  0.0  Basophil     %  0  Basophils Absolute     0.0 - 0.1 K/uL  0.0  Sodium     136 - 145 mmol/L 137 140  Potassium     3.3 - 4.7 mmol/L 4.4 4.8 (H)  Chloride     98 - 109 mmol/L 106 107  CO2     22 - 29 mmol/L 24 25  Glucose     70 - 140 mg/dL 140 (H) 106  BUN     7 - 26 mg/dL 16 16  Creatinine     0.60 - 1.10 mg/dL 0.79 0.89  Calcium     8.4 - 10.4 mg/dL 8.7 (L) 8.7  Total Protein     6.4 - 8.3 g/dL 7.1 6.7  Albumin     3.5 - 5.0 g/dL 3.8 3.6  AST     5 - 34 U/L 17 12  ALT     0 - 55 U/L 10 (L) 9  Alkaline Phosphatase     40 - 150 U/L 45 48  Total Bilirubin     0.2 - 1.2 mg/dL 2.1 (H) 2.4 (H)  GFR, Est Non African American     >60 mL/min >60 >60  GFR, Est African American     >60 mL/min >60 >60  Anion gap     3 - _0 WBC     4.0 - 10.5 K/uL 4.0   Platelets     150 - 400 K/uL 62 (L)   LDH     125 - 245 U/L  216    CBC Latest Ref Rng & Units 10/04/2017 09/15/2017 09/14/2017  WBC 4.0 - 10.5 K/uL - 4.0 3.2(L)  Hemoglobin 12.0 - 15.0 g/dL - 9.3(L) 9.1(L)  Hematocrit 34.8 - 46.6 % 30.4(L) 29.4(L) 29.0(L)  Platelets 150 - 400 K/uL - 62(L) 46(L)   . CBC    Component Value Date/Time   WBC 4.0 09/15/2017 1704   RBC 3.50 (L) 10/04/2017 0811   HGB 9.3 (L) 09/15/2017 1704   HGB 9.1 (L) 09/14/2017 0835   HCT 30.4 (L) 10/04/2017 0811   HCT 29.0 (L) 09/14/2017 0835   PLT 62 (L) 09/15/2017 1704   PLT 46 (L) 09/14/2017 0835   MCV 86.9 10/04/2017 0811   MCV 92.1 09/14/2017 0835   MCH 30.3 10/04/2017 0811   MCHC 34.8 10/04/2017 0811   RDW 18.3 (H) 10/04/2017 0811   RDW 18.2 (H) 09/14/2017 0835   LYMPHSABS 2.2 10/04/2017 0811   LYMPHSABS 2.0 09/14/2017 0835   MONOABS 0.2 10/04/2017  0811   MONOABS 0.3 09/14/2017 0835   EOSABS 0.0 10/04/2017 0811   EOSABS 0.0 09/14/2017 0835   BASOSABS 0.0 10/04/2017 0811   BASOSABS 0.0 09/14/2017 0835    . CMP Latest Ref Rng & Units 10/04/2017 09/15/2017 09/14/2017  Glucose 70 - 140 mg/dL 106 140(H) 126  BUN 7 - 26 mg/dL 16 16 13.5  Creatinine 0.60 - 1.10 mg/dL 0.89 0.79 0.9  Sodium 136 - 145 mmol/L 140 137 138  Potassium 3.3 - 4.7 mmol/L 4.8(H) 4.4 4.6  Chloride 98 - 109 mmol/L 107 106 -  CO2 22 - 29 mmol/L _1 Calcium 8.4 - 10.4 mg/dL 8.7 8.7(L) 8.7  Total Protein 6.4 - 8.3 g/dL 6.7 7.1 6.5  Total Bilirubin 0.2 - 1.2 mg/dL  2.4(H) 2.1(H) 2.23(H)  Alkaline Phos 40 - 150 U/L 48 45 50  AST 5 - 34 U/L _0 ALT 0 - 55 U/L 9 10(L) 7   Component     Latest Ref Rng & Units 07/13/2017  LDH     125 - 245 U/L 232  Hep C Virus Ab     0.0 - 0.9 s/co ratio <0.1  HIV Screen 4th Generation wRfx     Non Reactive Non Reactive  Hep B Core Ab, Tot     Negative Negative  Hepatitis B Surface Ag     Negative Negative   Component     Latest Ref Rng & Units 07/28/2017  LDH     125 - 245 U/L 200  Uric Acid, Serum     2.6 - 7.4 mg/dl 6.0  Phosphorus     2.5 - 4.5 mg/dL 3.8      RADIOGRAPHIC STUDIES: I have personally reviewed the radiological images as listed and agreed with the findings in the report. Ct Abdomen Pelvis W Contrast  Result Date: 09/15/2017 CLINICAL DATA:  Splenic cancer nausea vomiting and diarrhea history of B-cell lymphoma EXAM: CT ABDOMEN AND PELVIS WITH CONTRAST TECHNIQUE: Multidetector CT imaging of the abdomen and pelvis was performed using the standard protocol following bolus administration of intravenous contrast. CONTRAST:  100 mL Isovue-300 intravenous COMPARISON:  08/23/2017 and 07/25/2017 FINDINGS: Lower chest: Lung bases demonstrate no consolidation or pleural effusion. Normal heart size. Hepatobiliary: No focal liver abnormality is seen. No gallstones, gallbladder wall thickening, or biliary  dilatation. Pancreas: Unremarkable. No pancreatic ductal dilatation or surrounding inflammatory changes. Spleen: Massively enlarged, measuring 24.7 x 13.4 by 31.2, previous measurements of 25.3 x 14.8 x 31.3. Heterogenous enhancement. Adrenals/Urinary Tract: Adrenal glands are within normal limits. Prominent left renal pelvis. subcentimeter cortical hypodense lesions in the right kidney too small to further characterize. Bladder unremarkable Stomach/Bowel: The stomach is decompressed. Possible wall thickening at the pylorus. No dilated small bowel. Normal appendix. No colon wall thickening. Vascular/Lymphatic: Nonaneurysmal aorta. Re- demonstrated gastrohepatic lymph node measuring 12 mm and retrocaval lymph node measuring 15 mm. Reproductive: Uterus and bilateral adnexa are unremarkable. Other: Negative for free air or free fluid. Small left inguinal hernia containing fluid and fat. Significant mass effect from enlarged spleen with displacement of intra- abdominal organs and bowel to the right. Musculoskeletal: No acute or significant osseous findings. IMPRESSION: 1. Massive splenomegaly without significant interval change compared to the previous exams. 2. Possible thickening in the region of pylorus, could be due to spasm or gastritis 3. Negative for bowel obstruction Electronically Signed   By: Donavan Foil M.D.   On: 09/15/2017 19:30   ASSESSMENT & PLAN:   Michelle Cohen is a wonderful 67 y.o. caucasian female who presents to our clinic to discuss ongoing management of the following:   1.  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  Her clinical presentation is also representative of this and that this may have been  present over months to possibly years.  -With her pattern of involvement and results thus far it is likely that she has splenic marginal zone type lymphoma.  -PET scan on 07/25/2017 with results showing: Massive splenomegaly, splenic volume 5770 cubic cm,  with homogeneous low grade activity throughout the spleen equal to that of the physiologic activity in the liver, and above the background blood pool activity in the mediastinum. No pathologic adenopathy identified. No bony involvement noted.   -CT A/p on 08/23/17 with results of: Marked splenomegaly without substantial interval change in splenic size/volume. Borderline lymphadenopathy in the hepatoduodenal ligament. Aortic Atherosclerosis. I discussed this with the patient in great detail.  -CT A/p on 09/15/17 shows significant splenomegaly shows minimal smaller change in size, with no evidence of bowel obstruction.  PLAN  -She is having improved reticulocytosis and has shown less suppression than previously with her treatment with improving anemia hgb is up to 10.6 now. -However she spleen is still massive and is limiting improving in blood counts and causing moderate symptoms. -Pt discussed the splenectomy with Dr. Barry Dienes and would like to forgo surgery for now and proceed with another round of Rituxan.   -When she is ready to proceed with splenectomy she would like a second opinion about surgery with [facility]  -We discussed at length the risks and benefits of when to do an elective splenectomy.   -In the interim, she will proceed with 4 more doses of Rituxan weekly starting 10/05/17. Given her larger burden of disease I recommend  maintenance Rituxan q2 months up to 1-2 years to further shrink the spleen. -will plan to reassess spleen size in 2-3 months  2. 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  -Hgb has improved to 10.1 today, 10/04/17.  Plan -Anemia has continued to improve with treatment and is up to 10.6. We will monitor. -WBC at 3.4 in a stable range -neutropenia ANC overall stable at 1.0 today -- I have recommended infection precautions. -cannot safely use neulasta due to splenic size and risk for splenic rupture. Will need appropriate antibiotic prophylaxis if she chooses to proceed with splenectomy  -thrombocytopenia -stable.  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 Had to complete Rituxan at lower rate. Patient would not be a candidate for rapid infusion protocol and would need to keep the next infusion at the tolerate rate and not rate escalate. -Added Pepcid, steroids and Singulair to premedications and then tolerated treatment better. -Previously advised the patient to use anti-histamine, Zyrtec or Claritin in between treatments  -She did have a more severe infusion reaction last time, we front loaded her with IV Solumedrol as her steroid and Ativan for her fourth infusion and she tolerated her last infusion very well.  Plan  -Labs stable. No treatment-related prohibitive toxicity.   4. Oral mucositis/HSV outbreak -continue with acyclovir for prophylaxis -this has been improving throughout treatment and is was resolved.  -She has another episode of mucositis, I have refilled her magic mouthwash and she will restart acyclovir.  -I recommend washing her mouth with cultured butter milk or greek yogurt to avoid use of probiotics.  -Refill magic mouth wash and acyclovir today   F/u for Rituxan infusion weekly x 4 as schedule Weekly labs RTC with Dr Irene Limbo with labs in 2 weeks with 2nd dose of Rituxan   All of the patients questions were answered with apparent satisfaction. The  patient knows to call the clinic with any  problems, questions or concerns.  I spent 20 minutes counseling the patient face to face. The total time spent in the appointment was 25 minutes and more than 50% was on counseling and direct patient cares.  Sullivan Lone MD [place] AAHIVMS [facility] Hematology/Oncology Physician [facility]  (Office):       [phone] (Work cell):  [phone] (Fax):           [phone]  10/04/2017 9:47 AM  This document serves as a record of services personally performed by Sullivan Lone, MD. It was created on his behalf by Joslyn Devon, a trained medical scribe. The creation of this record is based on the scribe's personal observations and the provider's statements to them.    .I have reviewed the above documentation for accuracy and completeness, and I agree with the above. Brunetta Genera MD MS

## 2017-10-03 NOTE — Progress Notes (Signed)
The patient is approved for drug assistance by [facility] for Rituxan with coverage from 07/17/17 - 07/17/18. The first DOS covered by assistance is 07/21/17 (Day 1/Cycle 1). Enrollment is based on self Pay.

## 2017-10-04 ENCOUNTER — Inpatient Hospital Stay: Payer: Self-pay

## 2017-10-04 ENCOUNTER — Encounter: Payer: Self-pay | Admitting: Hematology

## 2017-10-04 ENCOUNTER — Inpatient Hospital Stay: Payer: Self-pay | Attending: Hematology | Admitting: Hematology

## 2017-10-04 VITALS — BP 113/68 | HR 78 | Temp 97.9°F | Resp 18 | Ht 66.0 in | Wt 147.4 lb

## 2017-10-04 DIAGNOSIS — M549 Dorsalgia, unspecified: Secondary | ICD-10-CM | POA: Insufficient documentation

## 2017-10-04 DIAGNOSIS — K1230 Oral mucositis (ulcerative), unspecified: Secondary | ICD-10-CM | POA: Insufficient documentation

## 2017-10-04 DIAGNOSIS — R161 Splenomegaly, not elsewhere classified: Secondary | ICD-10-CM

## 2017-10-04 DIAGNOSIS — R140 Abdominal distension (gaseous): Secondary | ICD-10-CM | POA: Insufficient documentation

## 2017-10-04 DIAGNOSIS — R0602 Shortness of breath: Secondary | ICD-10-CM | POA: Insufficient documentation

## 2017-10-04 DIAGNOSIS — C8307 Small cell B-cell lymphoma, spleen: Secondary | ICD-10-CM | POA: Insufficient documentation

## 2017-10-04 DIAGNOSIS — Z5112 Encounter for antineoplastic immunotherapy: Secondary | ICD-10-CM | POA: Insufficient documentation

## 2017-10-04 DIAGNOSIS — G4700 Insomnia, unspecified: Secondary | ICD-10-CM | POA: Insufficient documentation

## 2017-10-04 DIAGNOSIS — Z801 Family history of malignant neoplasm of trachea, bronchus and lung: Secondary | ICD-10-CM | POA: Insufficient documentation

## 2017-10-04 DIAGNOSIS — D72829 Elevated white blood cell count, unspecified: Secondary | ICD-10-CM | POA: Insufficient documentation

## 2017-10-04 DIAGNOSIS — R5383 Other fatigue: Secondary | ICD-10-CM | POA: Insufficient documentation

## 2017-10-04 DIAGNOSIS — R61 Generalized hyperhidrosis: Secondary | ICD-10-CM | POA: Insufficient documentation

## 2017-10-04 DIAGNOSIS — D649 Anemia, unspecified: Secondary | ICD-10-CM | POA: Insufficient documentation

## 2017-10-04 DIAGNOSIS — D696 Thrombocytopenia, unspecified: Secondary | ICD-10-CM

## 2017-10-04 DIAGNOSIS — R112 Nausea with vomiting, unspecified: Secondary | ICD-10-CM | POA: Insufficient documentation

## 2017-10-04 DIAGNOSIS — D709 Neutropenia, unspecified: Secondary | ICD-10-CM

## 2017-10-04 DIAGNOSIS — Z79899 Other long term (current) drug therapy: Secondary | ICD-10-CM | POA: Insufficient documentation

## 2017-10-04 DIAGNOSIS — R232 Flushing: Secondary | ICD-10-CM | POA: Insufficient documentation

## 2017-10-04 DIAGNOSIS — T451X5A Adverse effect of antineoplastic and immunosuppressive drugs, initial encounter: Secondary | ICD-10-CM | POA: Insufficient documentation

## 2017-10-04 DIAGNOSIS — Y92531 Health care provider office as the place of occurrence of the external cause: Secondary | ICD-10-CM | POA: Insufficient documentation

## 2017-10-04 DIAGNOSIS — D61818 Other pancytopenia: Secondary | ICD-10-CM | POA: Insufficient documentation

## 2017-10-04 DIAGNOSIS — R634 Abnormal weight loss: Secondary | ICD-10-CM | POA: Insufficient documentation

## 2017-10-04 LAB — CBC WITH DIFFERENTIAL (CANCER CENTER ONLY)
Abs Granulocyte: 1 10*3/uL — ABNORMAL LOW (ref 1.5–6.5)
BASOS ABS: 0 10*3/uL (ref 0.0–0.1)
Basophils Relative: 0 %
Eosinophils Absolute: 0 10*3/uL (ref 0.0–0.5)
Eosinophils Relative: 1 %
HCT: 30.4 % — ABNORMAL LOW (ref 34.8–46.6)
HEMOGLOBIN: 10.6 g/dL — AB (ref 11.6–15.9)
LYMPHS ABS: 2.2 10*3/uL (ref 0.9–3.3)
LYMPHS PCT: 64 %
MCH: 30.3 pg (ref 25.1–34.0)
MCHC: 34.8 g/dL (ref 31.5–36.0)
MCV: 86.9 fL (ref 79.5–101.0)
Monocytes Absolute: 0.2 10*3/uL (ref 0.1–0.9)
Monocytes Relative: 6 %
NEUTROS ABS: 1 10*3/uL — AB (ref 1.5–6.5)
NEUTROS PCT: 29 %
Platelet Count: 58 10*3/uL — ABNORMAL LOW (ref 145–400)
RBC: 3.5 MIL/uL — ABNORMAL LOW (ref 3.70–5.45)
RDW: 18.3 % — ABNORMAL HIGH (ref 11.2–16.1)
WBC Count: 3.4 10*3/uL — ABNORMAL LOW (ref 4.0–10.3)

## 2017-10-04 LAB — COMPREHENSIVE METABOLIC PANEL
ALK PHOS: 48 U/L (ref 40–150)
ALT: 9 U/L (ref 0–55)
ANION GAP: 8 (ref 3–11)
AST: 12 U/L (ref 5–34)
Albumin: 3.6 g/dL (ref 3.5–5.0)
BUN: 16 mg/dL (ref 7–26)
CALCIUM: 8.7 mg/dL (ref 8.4–10.4)
CO2: 25 mmol/L (ref 22–29)
Chloride: 107 mmol/L (ref 98–109)
Creatinine, Ser: 0.89 mg/dL (ref 0.60–1.10)
GFR calc Af Amer: >60 mL/min (ref >60)
GFR calc non Af Amer: >60 mL/min (ref >60)
GLUCOSE: 106 mg/dL (ref 70–140)
POTASSIUM: 4.8 mmol/L — AB (ref 3.3–4.7)
Sodium: 140 mmol/L (ref 136–145)
Total Bilirubin: 2.4 mg/dL — ABNORMAL HIGH (ref 0.2–1.2)
Total Protein: 6.7 g/dL (ref 6.4–8.3)

## 2017-10-04 LAB — LACTATE DEHYDROGENASE: LDH: 216 U/L (ref 125–245)

## 2017-10-04 MED ORDER — MAGIC MOUTHWASH W/LIDOCAINE: 5.0000 mL | Freq: Four times a day (QID) | ORAL | 0 refills | Status: DC | PRN

## 2017-10-04 MED ORDER — ACYCLOVIR 200 MG PO CAPS: 400.0000 mg | ORAL_CAPSULE | Freq: Two times a day (BID) | ORAL | 0 refills | Status: DC

## 2017-10-04 MED FILL — ACYCLOVIR 200 MG CAP: 200 | 15 days supply | Qty: 60 | Fill #0

## 2017-10-04 MED FILL — FOLIC ACID 1 MG TABLET: 1 | 30 days supply | Qty: 30 | Fill #0

## 2017-10-04 MED FILL — LIDOCAINE/MINTOX 1:1: 6 days supply | Qty: 120 | Fill #0

## 2017-10-04 NOTE — Patient Instructions (Signed)
Thank you for choosing [facility] to provide your oncology and hematology care.  To afford each patient quality time with our providers, please arrive 30 minutes before your scheduled appointment time.  If you arrive late for your appointment, you may be asked to reschedule.  We strive to give you quality time with our providers, and arriving late affects you and other patients whose appointments are after yours.   If you are a no show for multiple scheduled visits, you may be dismissed from the clinic at the providers discretion.    Again, thank you for choosing [facility], our hope is that these requests will decrease the amount of time that you wait before being seen by our physicians.  ______________________________________________________________________  Should you have questions after your visit to the [facility], please contact our office at [phone] between the hours of 8:30 and 4:30 p.m.    Voicemails left after 4:30p.m will not be returned until the following business day.    For prescription refill requests, please have your pharmacy contact us directly.  Please also try to allow 48 hours for prescription requests.    Please contact the scheduling department for questions regarding scheduling.  For scheduling of procedures such as PET scans, CT scans, MRI, Ultrasound, etc please contact central scheduling at [phone].    Resources For Cancer Patients and Caregivers:   Oncolink.org:  A wonderful resource for patients and healthcare providers for information regarding your disease, ways to tract your treatment, what to expect, etc.     American Cancer Society:  800-227-2345  Can help patients locate various types of support and financial assistance  Cancer Care: 1-800-813-HOPE (4673) Provides financial assistance, online support groups, medication/co-pay assistance.    [place] DSS:  336-641-3447 Where to apply for food  stamps, Medicaid, and utility assistance  Medicare Rights Center: 800-333-4114 Helps people with Medicare understand their rights and benefits, navigate the Medicare system, and secure the quality healthcare they deserve  SCAT: 336-333-6589 [place] [employer]'s shared-ride transportation service for eligible riders who have a disability that prevents them from riding the fixed route bus.    For additional information on assistance programs please contact our social worker:   Grier Hock/Abigail Elmore:  [phone]            

## 2017-10-05 ENCOUNTER — Inpatient Hospital Stay: Payer: Self-pay

## 2017-10-05 ENCOUNTER — Other Ambulatory Visit: Payer: Self-pay | Admitting: Medical

## 2017-10-05 ENCOUNTER — Ambulatory Visit (HOSPITAL_BASED_OUTPATIENT_CLINIC_OR_DEPARTMENT_OTHER): Payer: Self-pay | Admitting: Medical

## 2017-10-05 ENCOUNTER — Telehealth: Payer: Self-pay | Admitting: Radiation Oncology

## 2017-10-05 VITALS — BP 115/63 | HR 86 | Temp 98.4°F | Resp 18

## 2017-10-05 DIAGNOSIS — Z7189 Other specified counseling: Secondary | ICD-10-CM

## 2017-10-05 DIAGNOSIS — M545 Low back pain, unspecified: Secondary | ICD-10-CM

## 2017-10-05 DIAGNOSIS — Y92531 Health care provider office as the place of occurrence of the external cause: Secondary | ICD-10-CM

## 2017-10-05 DIAGNOSIS — M549 Dorsalgia, unspecified: Secondary | ICD-10-CM

## 2017-10-05 DIAGNOSIS — Z801 Family history of malignant neoplasm of trachea, bronchus and lung: Secondary | ICD-10-CM

## 2017-10-05 DIAGNOSIS — C8307 Small cell B-cell lymphoma, spleen: Secondary | ICD-10-CM

## 2017-10-05 DIAGNOSIS — T451X5A Adverse effect of antineoplastic and immunosuppressive drugs, initial encounter: Secondary | ICD-10-CM

## 2017-10-05 DIAGNOSIS — R112 Nausea with vomiting, unspecified: Secondary | ICD-10-CM

## 2017-10-05 DIAGNOSIS — R232 Flushing: Secondary | ICD-10-CM

## 2017-10-05 MED ORDER — METHYLPREDNISOLONE SODIUM SUCC 125 MG IJ SOLR
INTRAMUSCULAR | Status: AC
  Filled 2017-10-05: qty 2

## 2017-10-05 MED ORDER — MORPHINE SULFATE 4 MG/ML IJ SOLN
2.0000 mg | Freq: Once | INTRAMUSCULAR | Status: AC
  Administered 2017-10-05: 2 mg via INTRAVENOUS
  Filled 2017-10-05: qty 1

## 2017-10-05 MED ORDER — SODIUM CHLORIDE 0.9 % IV SOLN
Freq: Once | INTRAVENOUS | Status: AC
  Administered 2017-10-05: 12:00:00 via INTRAVENOUS

## 2017-10-05 MED ORDER — METHYLPREDNISOLONE SODIUM SUCC 125 MG IJ SOLR
125.0000 mg | Freq: Once | INTRAMUSCULAR | Status: AC
  Administered 2017-10-05: 125 mg via INTRAVENOUS

## 2017-10-05 MED ORDER — FAMOTIDINE IN NACL 20-0.9 MG/50ML-% IV SOLN
INTRAVENOUS | Status: AC
  Filled 2017-10-05: qty 50

## 2017-10-05 MED ORDER — ACETAMINOPHEN 325 MG PO TABS
ORAL_TABLET | ORAL | Status: AC
Start: 2017-10-05 — End: 2017-10-05
  Filled 2017-10-05: qty 2

## 2017-10-05 MED ORDER — DIPHENHYDRAMINE HCL 50 MG/ML IJ SOLN
50.0000 mg | Freq: Once | INTRAMUSCULAR | Status: AC
  Administered 2017-10-05: 50 mg via INTRAVENOUS

## 2017-10-05 MED ORDER — MONTELUKAST SODIUM 10 MG PO TABS
ORAL_TABLET | ORAL | Status: AC
  Filled 2017-10-05: qty 1

## 2017-10-05 MED ORDER — FAMOTIDINE IN NACL 20-0.9 MG/50ML-% IV SOLN
20.0000 mg | Freq: Once | INTRAVENOUS | Status: AC | PRN
  Administered 2017-10-05: 20 mg via INTRAVENOUS

## 2017-10-05 MED ORDER — LORAZEPAM 1 MG PO TABS
ORAL_TABLET | ORAL | Status: AC
  Filled 2017-10-05: qty 1

## 2017-10-05 MED ORDER — DIPHENHYDRAMINE HCL 50 MG/ML IJ SOLN
INTRAMUSCULAR | Status: AC
  Filled 2017-10-05: qty 1

## 2017-10-05 MED ORDER — MONTELUKAST SODIUM 10 MG PO TABS
10.0000 mg | ORAL_TABLET | Freq: Once | ORAL | Status: AC
  Administered 2017-10-05: 10 mg via ORAL

## 2017-10-05 MED ORDER — SODIUM CHLORIDE 0.9 % IV SOLN
375.0000 mg/m2 | Freq: Once | INTRAVENOUS | Status: AC
  Administered 2017-10-05: 700 mg via INTRAVENOUS
  Filled 2017-10-05: qty 50

## 2017-10-05 MED ORDER — ACETAMINOPHEN 325 MG PO TABS
650.0000 mg | ORAL_TABLET | Freq: Once | ORAL | Status: AC
  Administered 2017-10-05: 650 mg via ORAL

## 2017-10-05 MED ORDER — LORAZEPAM 1 MG PO TABS
0.5000 mg | ORAL_TABLET | Freq: Once | ORAL | Status: AC
  Administered 2017-10-05: 0.5 mg via ORAL

## 2017-10-05 MED ORDER — FAMOTIDINE IN NACL 20-0.9 MG/50ML-% IV SOLN
20.0000 mg | Freq: Once | INTRAVENOUS | Status: AC
  Administered 2017-10-05: 20 mg via INTRAVENOUS

## 2017-10-05 MED ORDER — MORPHINE SULFATE (PF) 4 MG/ML IV SOLN
INTRAVENOUS | Status: AC
  Filled 2017-10-05: qty 1

## 2017-10-05 MED ORDER — MORPHINE SULFATE 4 MG/ML IJ SOLN
2.0000 mg | Freq: Once | INTRAMUSCULAR | Status: DC
  Filled 2017-10-05: qty 1

## 2017-10-05 NOTE — Patient Instructions (Signed)
[facility] Discharge Instructions for Patients Receiving Chemotherapy  Today you received the following chemotherapy agent: Rituxan   To help prevent nausea and vomiting after your treatment, we encourage you to take your nausea medication as directed.    If you develop nausea and vomiting that is not controlled by your nausea medication, call the clinic.   BELOW ARE SYMPTOMS THAT SHOULD BE REPORTED IMMEDIATELY:  *FEVER GREATER THAN 100.5 F  *CHILLS WITH OR WITHOUT FEVER  NAUSEA AND VOMITING THAT IS NOT CONTROLLED WITH YOUR NAUSEA MEDICATION  *UNUSUAL SHORTNESS OF BREATH  *UNUSUAL BRUISING OR BLEEDING  TENDERNESS IN MOUTH AND THROAT WITH OR WITHOUT PRESENCE OF ULCERS  *URINARY PROBLEMS  *BOWEL PROBLEMS  UNUSUAL RASH Items with * indicate a potential emergency and should be followed up as soon as possible.  Feel free to call the clinic should you have any questions or concerns. The clinic phone number is [phone].  Please show the CHEMO ALERT CARD at check-in to the Emergency Department and triage nurse.   

## 2017-10-05 NOTE — Telephone Encounter (Signed)
LM for pt communicating that we were aware she was having surgery and wanted to wish her all the best.

## 2017-10-06 ENCOUNTER — Telehealth: Payer: Self-pay | Admitting: Hematology

## 2017-10-06 NOTE — Telephone Encounter (Signed)
Unable to schedule f/u due to booked MD schedule - message sent to MD - appt will be added per 1/09 los. When answer given by MD.

## 2017-10-06 NOTE — Progress Notes (Signed)
Shortly after Rituxan infusion began, patient became flushed and began vomiting. Rituxan infusion paused. Hypersensitivity protocol initiated. Sandi Mealy, PA-C came to see patient in infusion room. Patient then began c/o LBP. Medicated per orders. Patient returned to baseline before infusion resumed at 50% of initial rate. Patient completed infusion without further incident.

## 2017-10-06 NOTE — Progress Notes (Signed)
[facility] Progress Note   Michelle Cohen [id number] March 26, 1951 67 y.o.  Michelle Cohen is managed by Dr. Sullivan Lone  Actively treated with chemotherapy: yes  Current Therapy:  Rituxan    Last Treated: 10/05/2017  Assessment: Plan:    Adverse effect of chemotherapy, initial encounter  Michelle Cohen was seen in the infusion room for a suspected chemotherapy reaction. She was receiving rituximab at the time of her reaction. She had had just had her infusion started. Her symptoms included: Nausea, vomiting, hot flashes, flushing, and back pain. She was premedicated with Ativan 0.5 mg, Solu-Medrol 125 mg, Benadryl 50 mg, Singulair 10 mg, Pepcid 20 mg, and Tylenol 650 mg prior to starting chemotherapy. Rituximab was paused and Michelle Cohen was given Pepcid 20 mg IV and morphine 2 mg IV after onset of her symptoms. Michelle Cohen did  respond to intervention.  Rituximab was restarted with the patient completing her infusion without additional issues of concern.  Please see After Visit Summary for patient specific instructions.  Future Appointments  Date Time Provider [place]  10/12/2017 11:15 AM [facility] LAB 3 [facility] None  10/12/2017 12:00 PM [facility] D11 [facility] None  10/19/2017 11:00 AM [facility] LAB 3 [facility] None  10/19/2017 11:45 AM [facility] B6 [facility] None  10/26/2017 11:00 AM [facility] LAB 6 [facility] None  10/26/2017 11:45 AM [facility] I25 DNS [facility] None  11/20/2017 11:00 AM [facility] INJ NURSE [facility] None    No orders of the defined types were placed in this encounter.      Subjective:   Patient ID:  Michelle Cohen is a 67 y.o. (DOB 1950-12-29) female.  Chief Complaint: No chief complaint on file.   HPI Michelle Cohen was seen in the infusion room for a suspected reaction to chemotherapy. She was receiving rituximab at the time of her reaction. She had had just had her infusion started.  Her symptoms included: Nausea, vomiting, hot flashes, flushing, and back pain. She was premedicated with Ativan 0.5 mg, Solu-Medrol 125 mg, Benadryl 50 mg, Singulair 10 mg, Pepcid 20 mg, and Tylenol 650 mg prior to starting chemotherapy. Rituximab was paused and Michelle Cohen was given Pepcid 20 mg IV and morphine 2 mg IV after onset of her symptoms. Michelle Cohen did  respond to intervention. Rituximab was restarted with the patient completing her infusion without additional issues of concern.  Medications: I have reviewed the patient's current medications.  Allergies: No Known Allergies  Past Medical History:  Diagnosis Date  . 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 left inguinal hernia repair Left     Family History  Problem Relation Age of Onset  . Lung cancer Sister     Social History   Socioeconomic History  . Marital status: Single    Spouse name: Not on file  . Number of children: Not on file  . Years of education: Not on file  . Highest education level: Not on file  Social Needs  . Financial resource strain: Not on file  . Food insecurity - worry: Not on file  . Food insecurity - inability: Not on file  . Transportation needs - medical: Not on file  . Transportation needs - non-medical: Not on file  Occupational History  . Not on file  Tobacco Use  . Smoking status: Never Smoker  . Smokeless tobacco: Never Used  Substance and Sexual Activity  . Alcohol use: Yes    Comment: glass of wine  occasionally  . Drug use: No  . Sexual activity: Not on file  Other Topics Concern  . Not on file  Social History Narrative  . Not on file    Past Medical History, Surgical history, Social history, and Family history were reviewed and updated as appropriate.   Please see review of systems for further details on the patient's review from today.   Review of Systems:  Review of Systems  Constitutional: Negative for chills,  diaphoresis and fever.       Flushing and hot flashes  HENT: Negative for congestion and trouble swallowing.   Respiratory: Negative for cough, choking, chest tightness and shortness of breath.   Cardiovascular: Negative for chest pain and palpitations.  Gastrointestinal: Positive for nausea and vomiting.  Musculoskeletal: Positive for back pain.    Objective:   Physical Exam:  There were no vitals taken for this visit.  Physical Exam  HENT:  Head: Normocephalic.  Cardiovascular: Normal rate, regular rhythm and normal heart sounds. Exam reveals no gallop and no friction rub.  No murmur heard. Pulmonary/Chest: Effort normal and breath sounds normal. No respiratory distress. She has no wheezes. She has no rales.  Neurological: She is alert.  Skin: No rash noted. She is not diaphoretic.  Flushing of the face    Lab Review:     Component Value Date/Time   NA 140 10/04/2017 0811   NA 138 09/14/2017 0835   K 4.8 (H) 10/04/2017 0811   K 4.6 09/14/2017 0835   CL 107 10/04/2017 0811   CO2 25 10/04/2017 0811   CO2 23 09/14/2017 0835   GLUCOSE 106 10/04/2017 0811   GLUCOSE 126 09/14/2017 0835   BUN 16 10/04/2017 0811   BUN 13.5 09/14/2017 0835   CREATININE 0.89 10/04/2017 0811   CREATININE 0.9 09/14/2017 0835   CALCIUM 8.7 10/04/2017 0811   CALCIUM 8.7 09/14/2017 0835   PROT 6.7 10/04/2017 0811   PROT 6.5 09/14/2017 0835   ALBUMIN 3.6 10/04/2017 0811   ALBUMIN 3.5 09/14/2017 0835   AST 12 10/04/2017 0811   AST 13 09/14/2017 0835   ALT 9 10/04/2017 0811   ALT 7 09/14/2017 0835   ALKPHOS 48 10/04/2017 0811   ALKPHOS 50 09/14/2017 0835   BILITOT 2.4 (H) 10/04/2017 0811   BILITOT 2.23 (H) 09/14/2017 0835   GFRNONAA >60 10/04/2017 0811   GFRAA >60 10/04/2017 0811       Component Value Date/Time   WBC 4.0 09/15/2017 1704   RBC 3.50 (L) 10/04/2017 0811   HGB 9.3 (L) 09/15/2017 1704   HGB 9.1 (L) 09/14/2017 0835   HCT 30.4 (L) 10/04/2017 0811   HCT 29.0 (L) 09/14/2017  0835   PLT 62 (L) 09/15/2017 1704   PLT 46 (L) 09/14/2017 0835   MCV 86.9 10/04/2017 0811   MCV 92.1 09/14/2017 0835   MCH 30.3 10/04/2017 0811   MCHC 34.8 10/04/2017 0811   RDW 18.3 (H) 10/04/2017 0811   RDW 18.2 (H) 09/14/2017 0835   LYMPHSABS 2.2 10/04/2017 0811   LYMPHSABS 2.0 09/14/2017 0835   MONOABS 0.2 10/04/2017 0811   MONOABS 0.3 09/14/2017 0835   EOSABS 0.0 10/04/2017 0811   EOSABS 0.0 09/14/2017 0835   BASOSABS 0.0 10/04/2017 0811   BASOSABS 0.0 09/14/2017 0835   -------------------------------  Imaging from last 24 hours (if applicable):  Radiology interpretation: Ct Abdomen Pelvis W Contrast  Result Date: 09/15/2017 CLINICAL DATA:  Splenic cancer nausea vomiting and diarrhea history of B-cell lymphoma EXAM: CT ABDOMEN  AND PELVIS WITH CONTRAST TECHNIQUE: Multidetector CT imaging of the abdomen and pelvis was performed using the standard protocol following bolus administration of intravenous contrast. CONTRAST:  100 mL Isovue-300 intravenous COMPARISON:  08/23/2017 and 07/25/2017 FINDINGS: Lower chest: Lung bases demonstrate no consolidation or pleural effusion. Normal heart size. Hepatobiliary: No focal liver abnormality is seen. No gallstones, gallbladder wall thickening, or biliary dilatation. Pancreas: Unremarkable. No pancreatic ductal dilatation or surrounding inflammatory changes. Spleen: Massively enlarged, measuring 24.7 x 13.4 by 31.2, previous measurements of 25.3 x 14.8 x 31.3. Heterogenous enhancement. Adrenals/Urinary Tract: Adrenal glands are within normal limits. Prominent left renal pelvis. subcentimeter cortical hypodense lesions in the right kidney too small to further characterize. Bladder unremarkable Stomach/Bowel: The stomach is decompressed. Possible wall thickening at the pylorus. No dilated small bowel. Normal appendix. No colon wall thickening. Vascular/Lymphatic: Nonaneurysmal aorta. Re- demonstrated gastrohepatic lymph node measuring 12 mm and  retrocaval lymph node measuring 15 mm. Reproductive: Uterus and bilateral adnexa are unremarkable. Other: Negative for free air or free fluid. Small left inguinal hernia containing fluid and fat. Significant mass effect from enlarged spleen with displacement of intra- abdominal organs and bowel to the right. Musculoskeletal: No acute or significant osseous findings. IMPRESSION: 1. Massive splenomegaly without significant interval change compared to the previous exams. 2. Possible thickening in the region of pylorus, could be due to spasm or gastritis 3. Negative for bowel obstruction Electronically Signed   By: Donavan Foil M.D.   On: 09/15/2017 19:30

## 2017-10-12 ENCOUNTER — Inpatient Hospital Stay: Payer: Self-pay

## 2017-10-12 ENCOUNTER — Ambulatory Visit (HOSPITAL_BASED_OUTPATIENT_CLINIC_OR_DEPARTMENT_OTHER): Payer: Self-pay | Admitting: Medical

## 2017-10-12 ENCOUNTER — Other Ambulatory Visit: Payer: Self-pay | Admitting: Hematology

## 2017-10-12 VITALS — BP 115/60 | HR 79 | Temp 99.4°F | Resp 16 | Wt 144.8 lb

## 2017-10-12 DIAGNOSIS — M549 Dorsalgia, unspecified: Secondary | ICD-10-CM

## 2017-10-12 DIAGNOSIS — D72829 Elevated white blood cell count, unspecified: Secondary | ICD-10-CM

## 2017-10-12 DIAGNOSIS — R140 Abdominal distension (gaseous): Secondary | ICD-10-CM

## 2017-10-12 DIAGNOSIS — D61818 Other pancytopenia: Secondary | ICD-10-CM

## 2017-10-12 DIAGNOSIS — C8307 Small cell B-cell lymphoma, spleen: Secondary | ICD-10-CM

## 2017-10-12 DIAGNOSIS — D649 Anemia, unspecified: Secondary | ICD-10-CM

## 2017-10-12 DIAGNOSIS — Z5112 Encounter for antineoplastic immunotherapy: Secondary | ICD-10-CM

## 2017-10-12 DIAGNOSIS — Z7189 Other specified counseling: Secondary | ICD-10-CM

## 2017-10-12 DIAGNOSIS — R5383 Other fatigue: Secondary | ICD-10-CM

## 2017-10-12 DIAGNOSIS — G4700 Insomnia, unspecified: Secondary | ICD-10-CM

## 2017-10-12 DIAGNOSIS — R61 Generalized hyperhidrosis: Secondary | ICD-10-CM

## 2017-10-12 DIAGNOSIS — Y92531 Health care provider office as the place of occurrence of the external cause: Secondary | ICD-10-CM

## 2017-10-12 DIAGNOSIS — R634 Abnormal weight loss: Secondary | ICD-10-CM

## 2017-10-12 DIAGNOSIS — Z801 Family history of malignant neoplasm of trachea, bronchus and lung: Secondary | ICD-10-CM

## 2017-10-12 DIAGNOSIS — K1230 Oral mucositis (ulcerative), unspecified: Secondary | ICD-10-CM

## 2017-10-12 DIAGNOSIS — T451X5A Adverse effect of antineoplastic and immunosuppressive drugs, initial encounter: Secondary | ICD-10-CM

## 2017-10-12 DIAGNOSIS — R232 Flushing: Secondary | ICD-10-CM

## 2017-10-12 DIAGNOSIS — R112 Nausea with vomiting, unspecified: Secondary | ICD-10-CM

## 2017-10-12 DIAGNOSIS — R0602 Shortness of breath: Secondary | ICD-10-CM

## 2017-10-12 LAB — CBC WITH DIFFERENTIAL (CANCER CENTER ONLY)
BASOS ABS: 0 10*3/uL (ref 0.0–0.1)
Basophils Relative: 0 %
EOS ABS: 0 10*3/uL (ref 0.0–0.5)
EOS PCT: 0 %
HCT: 31.4 % — ABNORMAL LOW (ref 34.8–46.6)
Hemoglobin: 10 g/dL — ABNORMAL LOW (ref 11.6–15.9)
LYMPHS PCT: 61 %
Lymphs Abs: 2.7 10*3/uL (ref 0.9–3.3)
MCH: 28.1 pg (ref 25.1–34.0)
MCHC: 31.8 g/dL (ref 31.5–36.0)
MCV: 88.2 fL (ref 79.5–101.0)
Monocytes Absolute: 0.5 10*3/uL (ref 0.1–0.9)
Monocytes Relative: 10 %
Neutro Abs: 1.3 10*3/uL — ABNORMAL LOW (ref 1.5–6.5)
Neutrophils Relative %: 29 %
PLATELETS: 56 10*3/uL — AB (ref 145–400)
RBC: 3.56 MIL/uL — AB (ref 3.70–5.45)
RDW: 16.7 % — ABNORMAL HIGH (ref 11.2–16.1)
WBC Count: 4.5 10*3/uL (ref 3.9–10.3)

## 2017-10-12 LAB — CMP (CANCER CENTER ONLY)
ALT: 10 U/L (ref 0–55)
ANION GAP: 10 (ref 3–11)
AST: 13 U/L (ref 5–34)
Albumin: 3.5 g/dL (ref 3.5–5.0)
Alkaline Phosphatase: 49 U/L (ref 40–150)
BUN: 14 mg/dL (ref 7–26)
CHLORIDE: 105 mmol/L (ref 98–109)
CO2: 25 mmol/L (ref 22–29)
CREATININE: 0.84 mg/dL (ref 0.60–1.10)
Calcium: 9.1 mg/dL (ref 8.4–10.4)
GFR, Estimated: >60 mL/min (ref >60)
Glucose, Bld: 89 mg/dL (ref 70–140)
Potassium: 4.5 mmol/L (ref 3.3–4.7)
Sodium: 140 mmol/L (ref 136–145)
Total Bilirubin: 2.4 mg/dL — ABNORMAL HIGH (ref 0.2–1.2)
Total Protein: 6.7 g/dL (ref 6.4–8.3)

## 2017-10-12 LAB — RETICULOCYTES
RBC.: 3.56 MIL/uL — ABNORMAL LOW (ref 3.70–5.45)
RETIC COUNT ABSOLUTE: 74.8 10*3/uL (ref 33.7–90.7)
RETIC CT PCT: 2.1 % (ref 0.7–2.1)

## 2017-10-12 MED ORDER — LORAZEPAM 1 MG PO TABS
0.5000 mg | ORAL_TABLET | Freq: Once | ORAL | Status: AC
  Administered 2017-10-12: 0.5 mg via ORAL

## 2017-10-12 MED ORDER — MONTELUKAST SODIUM 10 MG PO TABS
10.0000 mg | ORAL_TABLET | Freq: Once | ORAL | Status: AC
  Administered 2017-10-12: 10 mg via ORAL

## 2017-10-12 MED ORDER — SODIUM CHLORIDE 0.9 % IV SOLN
Freq: Once | INTRAVENOUS | Status: AC
  Administered 2017-10-12: 13:00:00 via INTRAVENOUS

## 2017-10-12 MED ORDER — MONTELUKAST SODIUM 10 MG PO TABS
ORAL_TABLET | ORAL | Status: AC
  Filled 2017-10-12: qty 1

## 2017-10-12 MED ORDER — FAMOTIDINE IN NACL 20-0.9 MG/50ML-% IV SOLN
INTRAVENOUS | Status: AC
Start: 2017-10-12 — End: 2017-10-12
  Filled 2017-10-12: qty 50

## 2017-10-12 MED ORDER — LORAZEPAM 1 MG PO TABS
ORAL_TABLET | ORAL | Status: AC
  Filled 2017-10-12: qty 1

## 2017-10-12 MED ORDER — METHYLPREDNISOLONE SODIUM SUCC 125 MG IJ SOLR
125.0000 mg | Freq: Once | INTRAMUSCULAR | Status: AC
  Administered 2017-10-12: 125 mg via INTRAVENOUS

## 2017-10-12 MED ORDER — METHYLPREDNISOLONE SODIUM SUCC 125 MG IJ SOLR
INTRAMUSCULAR | Status: AC
  Filled 2017-10-12: qty 2

## 2017-10-12 MED ORDER — ACETAMINOPHEN 325 MG PO TABS
ORAL_TABLET | ORAL | Status: AC
Start: 2017-10-12 — End: 2017-10-12
  Filled 2017-10-12: qty 2

## 2017-10-12 MED ORDER — DIPHENHYDRAMINE HCL 50 MG/ML IJ SOLN
50.0000 mg | Freq: Once | INTRAMUSCULAR | Status: AC
  Administered 2017-10-12: 50 mg via INTRAVENOUS

## 2017-10-12 MED ORDER — MORPHINE SULFATE 4 MG/ML IJ SOLN
2.0000 mg | INTRAMUSCULAR | Status: DC | PRN
  Administered 2017-10-12: 2 mg via INTRAVENOUS
  Filled 2017-10-12: qty 1

## 2017-10-12 MED ORDER — FAMOTIDINE IN NACL 20-0.9 MG/50ML-% IV SOLN
20.0000 mg | Freq: Once | INTRAVENOUS | Status: AC | PRN
  Administered 2017-10-12: 20 mg via INTRAVENOUS

## 2017-10-12 MED ORDER — LORAZEPAM 2 MG/ML IJ SOLN
INTRAMUSCULAR | Status: AC
  Filled 2017-10-12: qty 1

## 2017-10-12 MED ORDER — FAMOTIDINE IN NACL 20-0.9 MG/50ML-% IV SOLN
20.0000 mg | Freq: Once | INTRAVENOUS | Status: AC
  Administered 2017-10-12: 20 mg via INTRAVENOUS

## 2017-10-12 MED ORDER — MORPHINE SULFATE (PF) 4 MG/ML IV SOLN
INTRAVENOUS | Status: AC
  Filled 2017-10-12: qty 1

## 2017-10-12 MED ORDER — LORAZEPAM 2 MG/ML IJ SOLN
0.5000 mg | Freq: Once | INTRAMUSCULAR | Status: AC
  Administered 2017-10-12: 0.5 mg via INTRAVENOUS

## 2017-10-12 MED ORDER — ACETAMINOPHEN 325 MG PO TABS
650.0000 mg | ORAL_TABLET | Freq: Once | ORAL | Status: AC
  Administered 2017-10-12: 650 mg via ORAL

## 2017-10-12 MED ORDER — SODIUM CHLORIDE 0.9 % IV SOLN
375.0000 mg/m2 | Freq: Once | INTRAVENOUS | Status: AC
  Administered 2017-10-12: 700 mg via INTRAVENOUS
  Filled 2017-10-12: qty 50

## 2017-10-12 MED ORDER — DIPHENHYDRAMINE HCL 50 MG/ML IJ SOLN
INTRAMUSCULAR | Status: AC
  Filled 2017-10-12: qty 1

## 2017-10-12 NOTE — Progress Notes (Signed)
Per MD Irene Limbo ok to treat today with platelets of 56 and ANC of 1.3.  Will begin with Rituxan at a rate of 50 mg/hr then increasing in 30 min increments to 100 mg/hr, 200 mg/hr, 300 mg/hr, and 400 mg/hr as tolerated.  PRN Morphine available for pt for abd/rib pain today.  1455- Pt reported back pain and visibly flushing.  NS wide open given, rituxan stopped.  2L [place] oxygen admin.  PA Lucianne Lei at bedside. 1458- VS: 125/58, 73 HR, 22 RR, 96% on [place], 98.9 temp 1459- Admin pepcid and 2 mg morphine given.  VS stable. 1512- Symptoms resolved.  Will continue to monitor.  Rate restarted at 1546 at 23 ml/hr for 11 ml (50mg /hr) then per PA Van increase by 50 mg/hr every 30 min as tolerated.

## 2017-10-12 NOTE — Patient Instructions (Signed)
[facility] Discharge Instructions for Patients Receiving Chemotherapy  Today you received the following chemotherapy agents rituxan.   To help prevent nausea and vomiting after your treatment, we encourage you to take your nausea medication as directed.     If you develop nausea and vomiting that is not controlled by your nausea medication, call the clinic.   BELOW ARE SYMPTOMS THAT SHOULD BE REPORTED IMMEDIATELY:  *FEVER GREATER THAN 100.5 F  *CHILLS WITH OR WITHOUT FEVER  NAUSEA AND VOMITING THAT IS NOT CONTROLLED WITH YOUR NAUSEA MEDICATION  *UNUSUAL SHORTNESS OF BREATH  *UNUSUAL BRUISING OR BLEEDING  TENDERNESS IN MOUTH AND THROAT WITH OR WITHOUT PRESENCE OF ULCERS  *URINARY PROBLEMS  *BOWEL PROBLEMS  UNUSUAL RASH Items with * indicate a potential emergency and should be followed up as soon as possible.  Feel free to call the clinic you have any questions or concerns. The clinic phone number is [phone].  

## 2017-10-12 NOTE — Progress Notes (Signed)
Per Alfredia Client per Dr. Irene Limbo "increase to next 50mg  dose, infuse for 1 hour, and then stop treatment". Rate increased to 137 mL/hr for 180mLs (300 mg/hr dose). Plan conveyed to pt and significant other who both conveyed understanding and agreement. Will continue to monitor.

## 2017-10-13 NOTE — Progress Notes (Signed)
Symptoms Management Clinic Progress Note   Michelle Cohen [id number] 02/13/1951 67 y.o.  Michelle Cohen is managed by Dr. Sullivan Lone  Actively treated with chemotherapy: yes  Current Therapy: Rituximab  Last Treated: 10/12/2017 (cycle 6, day 1)  Assessment: Plan:    Adverse effect of chemotherapy, initial encounter  Michelle Cohen was seen in the infusion room for a suspected chemotherapy reaction. She was receiving rituximab at the time of her reaction. Her symptoms included: Flushing, nausea and vomiting and back pain. She was premedicated with Ativan, Solu-Medrol, Benadryl, Singulair, Pepcid, and Tylenol prior to starting chemotherapy. Rituximab was paused and Michelle Cohen was given Ativan 0.5 mg IV and morphine sulfate 2 mg IV after onset of her symptoms. Michelle Cohen did  respond to intervention.   This case was discussed with Dr. Sullivan Lone, who directed that the following be done: That the patient would receive only 1 additional bump up of her rituximab over 1 hour then her treatment would be stopped for today.  Please see After Visit Summary for patient specific instructions.  Future Appointments  Date Time Provider [place]  10/19/2017 11:00 AM [facility] LAB 3 [facility] None  10/19/2017 11:45 AM [facility] B6 [facility] None  10/19/2017  1:40 PM Brunetta Genera, MD [facility] None  10/26/2017 11:00 AM [facility] LAB 6 [facility] None  10/26/2017 11:45 AM [facility] I25 DNS [facility] None  11/20/2017 11:00 AM [facility] INJ NURSE [facility] None    No orders of the defined types were placed in this encounter.      Subjective:   Patient ID:  Michelle Cohen is a 67 y.o. (DOB 1951-08-20) female.  Chief Complaint: No chief complaint on file.   HPI Michelle Cohen was seen in the infusion room for a suspected chemotherapy reaction. She was receiving rituximab at the time of her reaction. Her symptoms included:  Flushing, nausea and vomiting and back pain. She was premedicated with Ativan, Solu-Medrol, Benadryl, Singulair, Pepcid, and Tylenol prior to starting chemotherapy. Rituximab was paused and Michelle Cohen was given Ativan 0.5 mg IV and morphine sulfate 2 mg IV after onset of her symptoms. Michelle Cohen did  respond to intervention. This case was discussed with Dr. Sullivan Lone, who directed that the following be done: That the patient would receive only 1 additional bump up of her rituximab over 1 hour then her treatment would be stopped for today.  Medications: I have reviewed the patient's current medications.  Allergies: No Known Allergies  Past Medical History:  Diagnosis Date  . 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 left inguinal hernia repair Left     Family History  Problem Relation Age of Onset  . Lung cancer Sister     Social History   Socioeconomic History  . Marital status: Single    Spouse name: Not on file  . Number of children: Not on file  . Years of education: Not on file  . Highest education level: Not on file  Social Needs  . Financial resource strain: Not on file  . Food insecurity - worry: Not on file  . Food insecurity - inability: Not on file  . Transportation needs - medical: Not on file  . Transportation needs - non-medical: Not on file  Occupational History  . Not on file  Tobacco Use  . Smoking status: Never Smoker  . Smokeless tobacco: Never Used  Substance and Sexual Activity  . Alcohol use: Yes  Comment: glass of wine occasionally  . Drug use: No  . Sexual activity: Not on file  Other Topics Concern  . Not on file  Social History Narrative  . Not on file    Past Medical History, Surgical history, Social history, and Family history were reviewed and updated as appropriate.   Please see review of systems for further details on the patient's review from today.   Review of Systems:    Review of Systems  Constitutional: Negative for chills, diaphoresis and fever.  HENT: Negative for trouble swallowing.   Respiratory: Negative for cough, choking, chest tightness, shortness of breath, wheezing and stridor.   Cardiovascular: Negative for chest pain and palpitations.  Gastrointestinal: Positive for nausea and vomiting.  Musculoskeletal: Positive for back pain.  Skin:       Flushing of the cheeks    Objective:   Physical Exam:  There were no vitals taken for this visit.  Physical Exam  HENT:  Head: Normocephalic and atraumatic.  Cardiovascular: Normal rate, regular rhythm and normal heart sounds. Exam reveals no gallop and no friction rub.  No murmur heard. Pulmonary/Chest: Effort normal and breath sounds normal. No respiratory distress. She has no wheezes. She has no rales.  Neurological: She is alert.  Skin: Skin is warm. She is diaphoretic.  The patient's cheeks are flushed.   Lab Review:     Component Value Date/Time   NA 140 10/12/2017 1125   NA 138 09/14/2017 0835   K 4.5 10/12/2017 1125   K 4.6 09/14/2017 0835   CL 105 10/12/2017 1125   CO2 25 10/12/2017 1125   CO2 23 09/14/2017 0835   GLUCOSE 89 10/12/2017 1125   GLUCOSE 126 09/14/2017 0835   BUN 14 10/12/2017 1125   BUN 13.5 09/14/2017 0835   CREATININE 0.89 10/04/2017 0811   CREATININE 0.9 09/14/2017 0835   CALCIUM 9.1 10/12/2017 1125   CALCIUM 8.7 09/14/2017 0835   PROT 6.7 10/12/2017 1125   PROT 6.5 09/14/2017 0835   ALBUMIN 3.5 10/12/2017 1125   ALBUMIN 3.5 09/14/2017 0835   AST 13 10/12/2017 1125   AST 13 09/14/2017 0835   ALT 10 10/12/2017 1125   ALT 7 09/14/2017 0835   ALKPHOS 49 10/12/2017 1125   ALKPHOS 50 09/14/2017 0835   BILITOT 2.4 (H) 10/12/2017 1125   BILITOT 2.23 (H) 09/14/2017 0835   GFRNONAA >60 10/12/2017 1125   GFRAA >60 10/12/2017 1125       Component Value Date/Time   WBC 4.5 10/12/2017 1125   WBC 4.0 09/15/2017 1704   RBC 3.56 (L) 10/12/2017 1125   RBC  3.56 (L) 10/12/2017 1125   HGB 9.3 (L) 09/15/2017 1704   HGB 9.1 (L) 09/14/2017 0835   HCT 31.4 (L) 10/12/2017 1125   HCT 29.0 (L) 09/14/2017 0835   PLT 56 (L) 10/12/2017 1125   PLT 46 (L) 09/14/2017 0835   MCV 88.2 10/12/2017 1125   MCV 92.1 09/14/2017 0835   MCH 28.1 10/12/2017 1125   MCHC 31.8 10/12/2017 1125   RDW 16.7 (H) 10/12/2017 1125   RDW 18.2 (H) 09/14/2017 0835   LYMPHSABS 2.7 10/12/2017 1125   LYMPHSABS 2.0 09/14/2017 0835   MONOABS 0.5 10/12/2017 1125   MONOABS 0.3 09/14/2017 0835   EOSABS 0.0 10/12/2017 1125   EOSABS 0.0 09/14/2017 0835   BASOSABS 0.0 10/12/2017 1125   BASOSABS 0.0 09/14/2017 0835   -------------------------------  Imaging from last 24 hours (if applicable):  Radiology interpretation: Ct Abdomen Pelvis W Contrast  Result Date: 09/15/2017 CLINICAL DATA:  Splenic cancer nausea vomiting and diarrhea history of B-cell lymphoma EXAM: CT ABDOMEN AND PELVIS WITH CONTRAST TECHNIQUE: Multidetector CT imaging of the abdomen and pelvis was performed using the standard protocol following bolus administration of intravenous contrast. CONTRAST:  100 mL Isovue-300 intravenous COMPARISON:  08/23/2017 and 07/25/2017 FINDINGS: Lower chest: Lung bases demonstrate no consolidation or pleural effusion. Normal heart size. Hepatobiliary: No focal liver abnormality is seen. No gallstones, gallbladder wall thickening, or biliary dilatation. Pancreas: Unremarkable. No pancreatic ductal dilatation or surrounding inflammatory changes. Spleen: Massively enlarged, measuring 24.7 x 13.4 by 31.2, previous measurements of 25.3 x 14.8 x 31.3. Heterogenous enhancement. Adrenals/Urinary Tract: Adrenal glands are within normal limits. Prominent left renal pelvis. subcentimeter cortical hypodense lesions in the right kidney too small to further characterize. Bladder unremarkable Stomach/Bowel: The stomach is decompressed. Possible wall thickening at the pylorus. No dilated small bowel. Normal  appendix. No colon wall thickening. Vascular/Lymphatic: Nonaneurysmal aorta. Re- demonstrated gastrohepatic lymph node measuring 12 mm and retrocaval lymph node measuring 15 mm. Reproductive: Uterus and bilateral adnexa are unremarkable. Other: Negative for free air or free fluid. Small left inguinal hernia containing fluid and fat. Significant mass effect from enlarged spleen with displacement of intra- abdominal organs and bowel to the right. Musculoskeletal: No acute or significant osseous findings. IMPRESSION: 1. Massive splenomegaly without significant interval change compared to the previous exams. 2. Possible thickening in the region of pylorus, could be due to spasm or gastritis 3. Negative for bowel obstruction Electronically Signed   By: Donavan Foil M.D.   On: 09/15/2017 19:30        This case was discussed with Dr. Irene Limbo. He expresses agreement with my management of this patient.

## 2017-10-18 ENCOUNTER — Other Ambulatory Visit: Payer: Self-pay | Admitting: *Deleted

## 2017-10-18 MED ORDER — ACYCLOVIR 200 MG PO CAPS: 400.0000 mg | ORAL_CAPSULE | Freq: Two times a day (BID) | ORAL | 0 refills | Status: DC

## 2017-10-18 MED FILL — ACYCLOVIR 200 MG CAP: 200 | 15 days supply | Qty: 60 | Fill #0

## 2017-10-18 NOTE — Progress Notes (Signed)
  HEMATOLOGY/ONCOLOGY FOLLOW UP NOTE  Date of Service: 10/19/2017  Patient Care Team: Patient, No Pcp Per as PCP - General (General Practice)  CHIEF COMPLAINTS/PURPOSE OF INITIAL CONSULTATION:  Splenic marginal zone B-cell Non-Hodgkin's lymphoma   HISTORY OF PRESENTING ILLNESS:   Michelle Cohen is a wonderful 66 y.o. female who has been referred to us from [facility] for evaluation and management of B-cell lymphoma. She presents to her appointment today with her partner of 21 years. She states that she moved to the area ~2 years ago secondary to her sister's diagnosis of lung cancer (unfortunately, now passed) and taking care of her mother's Alzheimer's. Generally, prior to this issue she has been very healthy and without chronic medical conditions and not on chronic medical therapies/treatments.   Initially, the patient was admitted to [facility] for abdominal pain and significant hernia. She reports that she was visiting in [place] and while flying there she had an acute onset of sweats and left lower quadrant pain. She reports that her hernia was not present at that time, but as she continued to fly her hernia had become more noticeable and enlarged in her lower abdomen. Additionally, her partner reports that her episode of hydrosis was very significant, stating that it was pooling below her. On their arrival to [facility] on 06/21/17, a CT A/P was performed which showed an incarcerated hernia. Of note, her CT showed massive splenomegaly at 32cm in greatest dimension. Manual reduction of her hernia was attempted while in the ED but was unsuccessful. Pre-operative notes state that following intubation manual reduction was performed successfully and hernia was repaired laparoscopically. CT CAP was performed as well which was without the presence of additional surrounding LAD. She did have flow cytometry performed during her admission as well which showed B-cell lymphocytes  which were monoclonal. A bone marrow biopsy was also examined which showed that it was markedly hypercellular (~95%) with prominent interstitial infiltrate of small lymphocytes.   Prior to her diagnosis, she mostly felt typically well overall. She states that she did suffer from insomnia which she related to over-thinking at the time regarding her familial situation. She did experience some fatigue and weight loss with this as well. Her partner reports that she noticed her weight loss approximately 6mo ago, which she quantifies at approximately 30lbs since onset. Towards the end of this six month periods and just prior to the appearance of her hernia the patient began to notice some bothersome abdominal distension. She also noticed some significant fatigue and she would nap for longer periods following her daily workouts. Additionally, her partner would notice that she would have issues with breathing and this appeared irregular and more labored, especially at night while laying down. She also mentions that she did experience intermittent night sweats throughout this six month period as well. She denies lightheadedness, dizziness, abdominal pain, back pain, fever, chills, abnormal bruising/bleeding, bloody stools, epistaxis, hematuria, gingival bleeding, or other bleeding issues within this period. She has not received any blood transfusions and has not previously had a tattoo; she is unconcerned with prior HepC exposure.   Since her hernia surgery, she reports that she feels great symptomatically overall and much improved. She does have some issues with abdominal bloating and discomfort, but this has been manageable. No overt abdominal pain.   Throughout all of this, she has attempted to try to remain within good spirits. Losing her sister recently has been hard, but she has been working through it   with her family; however, her partner voices that they do not have strong social support in this area as most of  their support is in [place] where they previously lived. She has been working through her sister's death with grief counseling which has been helping her.    She has never previously smoked, but she reports some moderate second-hand smoke through her father at a younger age. Her father did die of a PE, but she is unaware of any other diagnoses of clotting disorders or cancers within her family. No h/o alcohol abuse, chemical/radiation exposures. She worked previously in Industrial/product designer.   PREVIOUS TREATMENT:  Rituximab weekly x4 beginning on 07/21/17-08/11/17   CURRENT TREATMENT:  Rituximab  Weekly for 4 weeks starting 10/05/17  INTERIM HISTORY:   Michelle Cohen returns today for scheduled follow up of her SMZL. Her last visit with [place] was on 10/04/17, and today we are visiting her in infusion. The pt reports that she is doing well overall and has been feeling better and better.   Of note since the patient last visit, pt has had a mild Rituxan infusion  reaction on 10/12/17 treated by Sandi Mealy, P.A while she was in infusion. Her symptoms included flushing, nausea, vomiting, and back pain. We discussed adding the pain meds up front to her Rituxan treatment in the future.   Her most recent lab results. She is still taking her B Vitamin and acyclovir for suppression of cold sores.  On review of systems, pt reports abdomen feeling better and less tight, has been moving her bowels well, and denies any other symptoms.   MEDICAL HISTORY:  Past Medical History:  Diagnosis Date  . B-cell lymphoma ([place]) 06/2017    SURGICAL HISTORY: Past Surgical History:  Procedure Laterality Date  . left inguinal hernia repair Left     SOCIAL HISTORY: Social History   Socioeconomic History  . Marital status: Single    Spouse name: Not on file  . Number of children: Not on file  . Years of education: Not on file  . Highest education level: Not on file  Social Needs  . Financial  resource strain: Not on file  . Food insecurity - worry: Not on file  . Food insecurity - inability: Not on file  . Transportation needs - medical: Not on file  . Transportation needs - non-medical: Not on file  Occupational History  . Not on file  Tobacco Use  . Smoking status: Never Smoker  . Smokeless tobacco: Never Used  Substance and Sexual Activity  . Alcohol use: Yes    Comment: glass of wine occasionally  . Drug use: No  . Sexual activity: Not on file  Other Topics Concern  . Not on file  Social History Narrative  . Not on file    FAMILY HISTORY: Sister - lung cancer Mother -Alzheimers dementia  ALLERGIES:  has No Known Allergies.  MEDICATIONS:  Current Outpatient Medications  Medication Sig Dispense Refill  . acetaminophen (TYLENOL) 500 MG tablet Take 500 mg by mouth every 8 (eight) hours as needed for moderate pain.     Marland Kitchen acyclovir (ZOVIRAX) 200 MG capsule Take 2 capsules (400 mg total) by mouth 2 (two) times daily. 60 capsule 0  . allopurinol (ZYLOPRIM) 100 MG tablet Take 100 mg by mouth daily.   2  . docusate sodium (COLACE) 100 MG capsule Take 100 mg by mouth 2 (two) times daily.    . folic acid (FOLVITE) 1 MG  tablet Take 1 tablet (1 mg total) by mouth daily. 30 tablet 2  . LORazepam (ATIVAN) 0.5 MG tablet Take 1 tablet (0.5 mg total) by mouth every 8 (eight) hours as needed for anxiety. 30 tablet 0  . magic mouthwash w/lidocaine SOLN Take 5 mLs by mouth 4 (four) times daily as needed for mouth pain. Swish and spit 120 mL 0  . senna (SENOKOT) 8.6 MG TABS tablet Take 1 tablet by mouth daily as needed for mild constipation.      No current facility-administered medications for this visit.     REVIEW OF SYSTEMS:    A 10+ POINT REVIEW OF SYSTEMS WAS OBTAINED including neurology, dermatology, psychiatry, cardiac, respiratory, lymph, extremities, GI, GU, Musculoskeletal, constitutional, breasts, reproductive, HEENT.  All pertinent positives are noted in the HPI.   All others are negative.  PHYSICAL EXAMINATION:  ECOG PERFORMANCE STATUS: 1  VS reviewed in EPIC GENERAL:alert, in no acute distress and comfortable SKIN: no acute rashes, no significant lesions EYES: conjunctiva are pink and non-injected, sclera anicteric OROPHARYNX: MMM, no exudates, no oropharyngeal erythema or ulceration (+) mucositis  NECK: supple, no JVD LYMPH:  no palpable lymphadenopathy in the cervical, axillary or inguinal regions LUNGS: clear to auscultation b/l with normal respiratory effort HEART: regular rate & rhythm ABDOMEN:  normoactive bowel sounds, non tender. Massive splenomegaly. No TTP. Extremity: resolved 1+ pedal edema bilaterally.   PSYCH: alert & oriented x 3 with fluent speech NEURO: no focal motor/sensory deficits  LABORATORY DATA:  I have reviewed the data as listed   CBC Latest Ref Rng & Units 10/19/2017 10/12/2017 10/04/2017  WBC 3.9 - 10.3 K/uL 4.4 4.5 3.4(L)  Hemoglobin 12.0 - 15.0 g/dL - - -  Hematocrit 34.8 - 46.6 % 29.2(L) 31.4(L) 30.4(L)  Platelets 145 - 400 K/uL 87(L) 56(L) 58(L)   . CBC    Component Value Date/Time   WBC 4.4 10/19/2017 1057   WBC 4.0 09/15/2017 1704   RBC 3.34 (L) 10/19/2017 1057   RBC 3.34 (L) 10/19/2017 1057   HGB 9.3 (L) 09/15/2017 1704   HGB 9.1 (L) 09/14/2017 0835   HCT 29.2 (L) 10/19/2017 1057   HCT 29.0 (L) 09/14/2017 0835   PLT 87 (L) 10/19/2017 1057   PLT 46 (L) 09/14/2017 0835   MCV 87.4 10/19/2017 1057   MCV 92.1 09/14/2017 0835   MCH 27.8 10/19/2017 1057   MCHC 31.8 10/19/2017 1057   RDW 17.0 (H) 10/19/2017 1057   RDW 18.2 (H) 09/14/2017 0835   LYMPHSABS PENDING 10/19/2017 1057   LYMPHSABS 2.0 09/14/2017 0835   MONOABS PENDING 10/19/2017 1057   MONOABS 0.3 09/14/2017 0835   EOSABS PENDING 10/19/2017 1057   EOSABS 0.0 09/14/2017 0835   BASOSABS PENDING 10/19/2017 1057   BASOSABS 0.0 09/14/2017 0835    . CMP Latest Ref Rng & Units 10/19/2017 10/12/2017 10/04/2017  Glucose 70 - 140 mg/dL 91 89 106   BUN 7 - 26 mg/dL _0 Creatinine 0.60 - 1.10 mg/dL - - 0.89  Sodium 136 - 145 mmol/L 139 140 140  Potassium 3.3 - 4.7 mmol/L 4.5 4.5 4.8(H)  Chloride 98 - 109 mmol/L 106 105 107  CO2 22 - 29 mmol/L _1 Calcium 8.4 - 10.4 mg/dL 8.9 9.1 8.7  Total Protein 6.4 - 8.3 g/dL 6.9 6.7 6.7  Total Bilirubin 0.2 - 1.2 mg/dL 1.8(H) 2.4(H) 2.4(H)  Alkaline Phos 40 - 150 U/L 58 49 48  AST 5 - 34 U/L 15 13  12  ALT 0 - 55 U/L 11 10 9   Component     Latest Ref Rng & Units 07/13/2017  LDH     125 - 245 U/L 232  Hep C Virus Ab     0.0 - 0.9 s/co ratio <0.1  HIV Screen 4th Generation wRfx     Non Reactive Non Reactive  Hep B Core Ab, Tot     Negative Negative  Hepatitis B Surface Ag     Negative Negative   Component     Latest Ref Rng & Units 07/28/2017  LDH     125 - 245 U/L 200  Uric Acid, Serum     2.6 - 7.4 mg/dl 6.0  Phosphorus     2.5 - 4.5 mg/dL 3.8      RADIOGRAPHIC STUDIES: I have personally reviewed the radiological images as listed and agreed with the findings in the report. No results found. ASSESSMENT & PLAN:   Michelle Cohen is a wonderful 66 y.o. caucasian female who presents to our clinic to discuss ongoing management of the following:   1.  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  Her clinical presentation is also representative of this and that this may have been present over months to possibly years.  -With her pattern of involvement and results thus far it is likely that she has splenic marginal zone type lymphoma.  -PET scan on 07/25/2017 with results showing: Massive splenomegaly, splenic volume 5770 cubic cm, with homogeneous low grade activity throughout the spleen  equal to that of the physiologic activity in the liver, and above the background blood pool activity in the mediastinum. No pathologic adenopathy identified. No bony involvement noted.   -CT A/p on 08/23/17 with results of: Marked splenomegaly without substantial interval change in splenic size/volume. Borderline lymphadenopathy in the hepatoduodenal ligament. Aortic Atherosclerosis. I discussed this with the patient in great detail.  -CT A/p on 09/15/17 shows significant splenomegaly shows minimal smaller change in size, with no evidence of bowel obstruction.   -Pt discussed the splenectomy with Dr. Byerly and would like to forgo surgery for now and proceed with another round of Rituxan.   Plan -labs show improved platelets to 87k today. -received C7 of Rituxan (had mild infusion reaction characterized by some nausea/flushing and back cramps) - resolved rapidly with benadryl 25mg/Morphine 2mg and zofran 4mg and patient was able to continue and finish the remaing infusion. (similar to previous reaction - despite significant premedication). -will f/u for 8th and last dose of this series then will setup for maintenance Rituxan q2 months up to 1-2 years to further shrink the spleen. -will plan to reassess spleen size in 2-3 months -Discussed the pts last chemotherapy reaction and will move up her morphine to the front of her Rituxan treatment to offset her reactionary symptoms that occur during the first 15 minutes of her chemo treatments.  -Platelets at 87k today (10/19/17) -Discussed a CT scan coming up after this cycle  2. 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  -  Hgb has improved to 10.1 today, 10/04/17.  Plan -Anemia has continued to improve with treatment and is up to 10.6. We will  monitor. -WBC at 4.4 in a stable range -neutropenia ANC overall stable at 1.3 today -- I have recommended infection precautions. -cannot safely use neulasta due to splenic size and risk for splenic rupture. Will need appropriate antibiotic prophylaxis if she chooses to proceed with splenectomy  -thrombocytopenia -improved to 87k from the 40-50k range.  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 Had to complete Rituxan at lower rate. Patient would not be a candidate for rapid infusion protocol and would need to keep the next infusion at the tolerate rate and not rate escalate. Plan  -continue extensive pre-medications as currently -added morphine since she tends to have painful muscle cramping. -will continue to monitor for this.  4. Oral mucositis/HSV outbreak -continue with acyclovir for prophylaxis -this has been improving throughout treatment and is was resolved.  -She has another episode of mucositis, I have refilled her magic mouthwash and continue acyclovir.   -Complete last planned dose of Rituxan as scheduled on 10/26/2017 -RTC with Dr Irene Limbo in 3 weeks with labs   All of the patients questions were answered with apparent satisfaction. The patient knows to call the clinic with any problems, questions or concerns.  I spent 20 minutes counseling the patient face to face. The total time spent in the appointment was 25 minutes and more than 50% was on counseling and direct patient cares.  Sullivan Lone MD [place] AAHIVMS [facility] Hematology/Oncology Physician [facility]  (Office):       [phone] (Work cell):  [phone] (Fax):           [phone]  This document serves as a record of services personally performed by Sullivan Lone, MD. It was created on his behalf by Baldwin Jamaica, a trained medical scribe. The creation of this record is based on the  scribe's personal observations and the provider's statements to them.   .I have reviewed the above documentation for accuracy and completeness, and I agree with the above. Brunetta Genera MD MS

## 2017-10-19 ENCOUNTER — Inpatient Hospital Stay: Payer: Self-pay

## 2017-10-19 ENCOUNTER — Inpatient Hospital Stay (HOSPITAL_BASED_OUTPATIENT_CLINIC_OR_DEPARTMENT_OTHER): Payer: Self-pay | Admitting: Hematology

## 2017-10-19 VITALS — BP 129/77 | HR 105 | Temp 98.2°F | Resp 19

## 2017-10-19 DIAGNOSIS — Z79899 Other long term (current) drug therapy: Secondary | ICD-10-CM

## 2017-10-19 DIAGNOSIS — Z7189 Other specified counseling: Secondary | ICD-10-CM

## 2017-10-19 DIAGNOSIS — R5383 Other fatigue: Secondary | ICD-10-CM

## 2017-10-19 DIAGNOSIS — C8307 Small cell B-cell lymphoma, spleen: Secondary | ICD-10-CM

## 2017-10-19 DIAGNOSIS — T8090XA Unspecified complication following infusion and therapeutic injection, initial encounter: Secondary | ICD-10-CM

## 2017-10-19 DIAGNOSIS — R61 Generalized hyperhidrosis: Secondary | ICD-10-CM

## 2017-10-19 DIAGNOSIS — K1230 Oral mucositis (ulcerative), unspecified: Secondary | ICD-10-CM

## 2017-10-19 DIAGNOSIS — D649 Anemia, unspecified: Secondary | ICD-10-CM

## 2017-10-19 DIAGNOSIS — Y92531 Health care provider office as the place of occurrence of the external cause: Secondary | ICD-10-CM

## 2017-10-19 DIAGNOSIS — D61818 Other pancytopenia: Secondary | ICD-10-CM

## 2017-10-19 DIAGNOSIS — G4700 Insomnia, unspecified: Secondary | ICD-10-CM

## 2017-10-19 DIAGNOSIS — D72829 Elevated white blood cell count, unspecified: Secondary | ICD-10-CM

## 2017-10-19 DIAGNOSIS — R0602 Shortness of breath: Secondary | ICD-10-CM

## 2017-10-19 DIAGNOSIS — R634 Abnormal weight loss: Secondary | ICD-10-CM

## 2017-10-19 DIAGNOSIS — Z801 Family history of malignant neoplasm of trachea, bronchus and lung: Secondary | ICD-10-CM

## 2017-10-19 DIAGNOSIS — M549 Dorsalgia, unspecified: Secondary | ICD-10-CM

## 2017-10-19 DIAGNOSIS — Z5112 Encounter for antineoplastic immunotherapy: Secondary | ICD-10-CM

## 2017-10-19 DIAGNOSIS — R112 Nausea with vomiting, unspecified: Secondary | ICD-10-CM

## 2017-10-19 DIAGNOSIS — R140 Abdominal distension (gaseous): Secondary | ICD-10-CM

## 2017-10-19 DIAGNOSIS — T451X5A Adverse effect of antineoplastic and immunosuppressive drugs, initial encounter: Secondary | ICD-10-CM

## 2017-10-19 DIAGNOSIS — D696 Thrombocytopenia, unspecified: Secondary | ICD-10-CM

## 2017-10-19 DIAGNOSIS — R232 Flushing: Secondary | ICD-10-CM

## 2017-10-19 LAB — CMP (CANCER CENTER ONLY)
ALBUMIN: 3.3 g/dL — AB (ref 3.5–5.0)
ALT: 11 U/L (ref 0–55)
ANION GAP: 10 (ref 3–11)
AST: 15 U/L (ref 5–34)
Alkaline Phosphatase: 58 U/L (ref 40–150)
BUN: 15 mg/dL (ref 7–26)
CO2: 23 mmol/L (ref 22–29)
Calcium: 8.9 mg/dL (ref 8.4–10.4)
Chloride: 106 mmol/L (ref 98–109)
Creatinine: 0.82 mg/dL (ref 0.60–1.10)
GFR, Estimated: >60 mL/min (ref >60)
GLUCOSE: 91 mg/dL (ref 70–140)
POTASSIUM: 4.5 mmol/L (ref 3.3–4.7)
Sodium: 139 mmol/L (ref 136–145)
TOTAL PROTEIN: 6.9 g/dL (ref 6.4–8.3)
Total Bilirubin: 1.8 mg/dL — ABNORMAL HIGH (ref 0.2–1.2)

## 2017-10-19 LAB — CBC WITH DIFFERENTIAL (CANCER CENTER ONLY)
BAND NEUTROPHILS: 0 %
BLASTS: 0 %
Basophils Absolute: 0 10*3/uL (ref 0.0–0.1)
Basophils Relative: 0 %
Eosinophils Absolute: 0 10*3/uL (ref 0.0–0.5)
Eosinophils Relative: 1 %
HEMATOCRIT: 29.2 % — AB (ref 34.8–46.6)
HEMOGLOBIN: 9.3 g/dL — AB (ref 11.6–15.9)
LYMPHS PCT: 59 %
Lymphs Abs: 2.6 10*3/uL (ref 0.9–3.3)
MCH: 27.8 pg (ref 25.1–34.0)
MCHC: 31.8 g/dL (ref 31.5–36.0)
MCV: 87.4 fL (ref 79.5–101.0)
MONOS PCT: 11 %
Metamyelocytes Relative: 0 %
Monocytes Absolute: 0.5 10*3/uL (ref 0.1–0.9)
Myelocytes: 0 %
NEUTROS ABS: 1.3 10*3/uL — AB (ref 1.5–6.5)
NEUTROS PCT: 29 %
NRBC: 0 /100{WBCs}
OTHER: 0 %
Platelet Count: 87 10*3/uL — ABNORMAL LOW (ref 145–400)
Promyelocytes Absolute: 0 %
RBC: 3.34 MIL/uL — AB (ref 3.70–5.45)
RDW: 17 % — AB (ref 11.2–16.1)
WBC: 4.4 10*3/uL (ref 3.9–10.3)

## 2017-10-19 LAB — RETICULOCYTES
RBC.: 3.34 MIL/uL — ABNORMAL LOW (ref 3.70–5.45)
Retic Count, Absolute: 73.5 10*3/uL (ref 33.7–90.7)
Retic Ct Pct: 2.2 % — ABNORMAL HIGH (ref 0.7–2.1)

## 2017-10-19 MED ORDER — DIPHENHYDRAMINE HCL 50 MG/ML IJ SOLN
25.0000 mg | Freq: Once | INTRAMUSCULAR | Status: AC | PRN
  Administered 2017-10-19: 25 mg via INTRAVENOUS

## 2017-10-19 MED ORDER — ONDANSETRON HCL 40 MG/20ML IJ SOLN: Freq: Once | INTRAMUSCULAR | Status: DC

## 2017-10-19 MED ORDER — FAMOTIDINE IN NACL 20-0.9 MG/50ML-% IV SOLN
20.0000 mg | Freq: Once | INTRAVENOUS | Status: AC
  Administered 2017-10-19: 20 mg via INTRAVENOUS

## 2017-10-19 MED ORDER — SODIUM CHLORIDE 0.9 % IV SOLN
375.0000 mg/m2 | Freq: Once | INTRAVENOUS | Status: AC
  Administered 2017-10-19: 700 mg via INTRAVENOUS
  Filled 2017-10-19: qty 50

## 2017-10-19 MED ORDER — ACETAMINOPHEN 325 MG PO TABS
ORAL_TABLET | ORAL | Status: AC
  Filled 2017-10-19: qty 2

## 2017-10-19 MED ORDER — METHYLPREDNISOLONE SODIUM SUCC 125 MG IJ SOLR
INTRAMUSCULAR | Status: AC
  Filled 2017-10-19: qty 2

## 2017-10-19 MED ORDER — SODIUM CHLORIDE 0.9 % IV SOLN
Freq: Once | INTRAVENOUS | Status: AC
Start: 2017-10-19 — End: 2017-10-19
  Administered 2017-10-19: 12:00:00 via INTRAVENOUS

## 2017-10-19 MED ORDER — MONTELUKAST SODIUM 10 MG PO TABS
ORAL_TABLET | ORAL | Status: AC
Start: 2017-10-19 — End: 2017-10-19
  Filled 2017-10-19: qty 1

## 2017-10-19 MED ORDER — ONDANSETRON HCL 4 MG/2ML IJ SOLN
INTRAMUSCULAR | Status: AC
  Filled 2017-10-19: qty 2

## 2017-10-19 MED ORDER — LORAZEPAM 1 MG PO TABS
ORAL_TABLET | ORAL | Status: AC
  Filled 2017-10-19: qty 1

## 2017-10-19 MED ORDER — MORPHINE SULFATE 4 MG/ML IJ SOLN
2.0000 mg | Freq: Once | INTRAMUSCULAR | Status: AC
Start: 2017-10-19 — End: 2017-10-19
  Administered 2017-10-19: 2 mg via INTRAVENOUS
  Filled 2017-10-19: qty 1

## 2017-10-19 MED ORDER — DIPHENHYDRAMINE HCL 50 MG/ML IJ SOLN
INTRAMUSCULAR | Status: AC
Start: 2017-10-19 — End: 2017-10-19
  Filled 2017-10-19: qty 1

## 2017-10-19 MED ORDER — MORPHINE SULFATE (PF) 4 MG/ML IV SOLN
INTRAVENOUS | Status: AC
  Filled 2017-10-19: qty 1

## 2017-10-19 MED ORDER — FAMOTIDINE IN NACL 20-0.9 MG/50ML-% IV SOLN
INTRAVENOUS | Status: AC
Start: 2017-10-19 — End: 2017-10-19
  Filled 2017-10-19: qty 50

## 2017-10-19 MED ORDER — ACETAMINOPHEN 325 MG PO TABS
650.0000 mg | ORAL_TABLET | Freq: Once | ORAL | Status: AC
  Administered 2017-10-19: 650 mg via ORAL

## 2017-10-19 MED ORDER — LORAZEPAM 1 MG PO TABS
0.5000 mg | ORAL_TABLET | Freq: Once | ORAL | Status: AC
  Administered 2017-10-19: 0.5 mg via ORAL

## 2017-10-19 MED ORDER — MORPHINE SULFATE 4 MG/ML IJ SOLN
2.0000 mg | INTRAMUSCULAR | Status: DC | PRN
  Administered 2017-10-19: 4 mg via INTRAVENOUS
  Filled 2017-10-19: qty 1

## 2017-10-19 MED ORDER — ONDANSETRON HCL 4 MG/2ML IJ SOLN
4.0000 mg | Freq: Once | INTRAMUSCULAR | Status: AC
  Administered 2017-10-19: 4 mg via INTRAVENOUS

## 2017-10-19 MED ORDER — METHYLPREDNISOLONE SODIUM SUCC 125 MG IJ SOLR
125.0000 mg | Freq: Once | INTRAMUSCULAR | Status: AC
  Administered 2017-10-19: 125 mg via INTRAVENOUS

## 2017-10-19 MED ORDER — MONTELUKAST SODIUM 10 MG PO TABS
10.0000 mg | ORAL_TABLET | Freq: Once | ORAL | Status: AC
  Administered 2017-10-19: 10 mg via ORAL

## 2017-10-19 MED ORDER — DIPHENHYDRAMINE HCL 50 MG/ML IJ SOLN
50.0000 mg | Freq: Once | INTRAMUSCULAR | Status: AC
  Administered 2017-10-19: 50 mg via INTRAVENOUS

## 2017-10-19 MED ORDER — DIPHENHYDRAMINE HCL 50 MG/ML IJ SOLN
25.0000 mg | Freq: Once | INTRAMUSCULAR | Status: AC
  Administered 2017-10-19: 25 mg via INTRAVENOUS

## 2017-10-19 MED ORDER — DIPHENHYDRAMINE HCL 50 MG/ML IJ SOLN
INTRAMUSCULAR | Status: AC
  Filled 2017-10-19: qty 1

## 2017-10-19 NOTE — Progress Notes (Signed)
Pt receiving Rituxan 66mL/hr (second rate change at 100mg ). Pt started to feel pain and discomfort in her lower back, "head spinning", cheeks flushed, and slight SOB. Rituxan stopped at 1419 and IVF started at 955mL/hr. Sunol placed at 2L/hr. Dr. Irene Limbo notified of change. Arrived chairside at 1421. 25mg  benadryl IV given at 1425. 2mg  morphine given at 1428. Pt noted nausea at 1429, and 4mg  zofran given at 1432. Dr. Irene Limbo okay to restart 10-15 minutes as long as pt feeling okay. Restart at base rate.

## 2017-10-19 NOTE — Patient Instructions (Signed)
[facility] Discharge Instructions for Patients Receiving Chemotherapy  Today you received the following chemotherapy agents:  Rituxan   To help prevent nausea and vomiting after your treatment, we encourage you to take your nausea medication as prescribed.   If you develop nausea and vomiting that is not controlled by your nausea medication, call the clinic.   BELOW ARE SYMPTOMS THAT SHOULD BE REPORTED IMMEDIATELY:  *FEVER GREATER THAN 100.5 F  *CHILLS WITH OR WITHOUT FEVER  NAUSEA AND VOMITING THAT IS NOT CONTROLLED WITH YOUR NAUSEA MEDICATION  *UNUSUAL SHORTNESS OF BREATH  *UNUSUAL BRUISING OR BLEEDING  TENDERNESS IN MOUTH AND THROAT WITH OR WITHOUT PRESENCE OF ULCERS  *URINARY PROBLEMS  *BOWEL PROBLEMS  UNUSUAL RASH Items with * indicate a potential emergency and should be followed up as soon as possible.  Feel free to call the clinic should you have any questions or concerns. The clinic phone number is [phone].  Please show the CHEMO ALERT CARD at check-in to the Emergency Department and triage nurse.   

## 2017-10-19 NOTE — Progress Notes (Signed)
Ok to treat with todays labs per Dr. Irene Limbo.  Cyndia Bent RN

## 2017-10-19 NOTE — Progress Notes (Signed)
Approx 1738 patient notes she was started to itch. Pateints arms began to look flushed. Called Dr. Lindi Adie on call physician for verbal order for 25mg  benadryl IV. Bendadryl given at 1742 and Rituxan paused. NS wide open. Wait time 10 minutes to restart rituxan. Patient verbally expressed the itching was subsiding and the redness in her skin was as well. Restarted rituxan at 1753 at dose it was currently running at prior to pausing. Will continue to monitor.  Cyndia Bent RN

## 2017-10-19 NOTE — Progress Notes (Signed)
Patient experienced a slight reddened rash on extremities. Therapy complete. Lines flushed. Vitals Stable. Patient verbalized she feels well. Pt and family member told to call on call line if rash appears to get worse or any other symptoms develop.  Cyndia Bent RN

## 2017-10-26 ENCOUNTER — Inpatient Hospital Stay: Payer: Self-pay

## 2017-10-26 ENCOUNTER — Ambulatory Visit (HOSPITAL_BASED_OUTPATIENT_CLINIC_OR_DEPARTMENT_OTHER): Payer: Self-pay | Admitting: Medical

## 2017-10-26 VITALS — BP 114/59 | HR 107 | Temp 99.9°F | Resp 17 | Wt 145.0 lb

## 2017-10-26 DIAGNOSIS — R112 Nausea with vomiting, unspecified: Secondary | ICD-10-CM

## 2017-10-26 DIAGNOSIS — T451X5A Adverse effect of antineoplastic and immunosuppressive drugs, initial encounter: Secondary | ICD-10-CM

## 2017-10-26 DIAGNOSIS — Z801 Family history of malignant neoplasm of trachea, bronchus and lung: Secondary | ICD-10-CM

## 2017-10-26 DIAGNOSIS — Z7189 Other specified counseling: Secondary | ICD-10-CM

## 2017-10-26 DIAGNOSIS — M549 Dorsalgia, unspecified: Secondary | ICD-10-CM

## 2017-10-26 DIAGNOSIS — C8307 Small cell B-cell lymphoma, spleen: Secondary | ICD-10-CM

## 2017-10-26 DIAGNOSIS — D649 Anemia, unspecified: Secondary | ICD-10-CM

## 2017-10-26 DIAGNOSIS — R232 Flushing: Secondary | ICD-10-CM

## 2017-10-26 LAB — CMP (CANCER CENTER ONLY)
ALT: 12 U/L (ref 0–55)
ANION GAP: 9 (ref 3–11)
AST: 21 U/L (ref 5–34)
Albumin: 3.3 g/dL — ABNORMAL LOW (ref 3.5–5.0)
Alkaline Phosphatase: 68 U/L (ref 40–150)
BUN: 13 mg/dL (ref 7–26)
CHLORIDE: 104 mmol/L (ref 98–109)
CO2: 25 mmol/L (ref 22–29)
Calcium: 9 mg/dL (ref 8.4–10.4)
Creatinine: 0.79 mg/dL (ref 0.60–1.10)
GFR, Estimated: >60 mL/min (ref >60)
Glucose, Bld: 91 mg/dL (ref 70–140)
POTASSIUM: 4.6 mmol/L (ref 3.5–5.1)
SODIUM: 138 mmol/L (ref 136–145)
Total Bilirubin: 1.8 mg/dL — ABNORMAL HIGH (ref 0.2–1.2)
Total Protein: 7 g/dL (ref 6.4–8.3)

## 2017-10-26 LAB — CBC WITH DIFFERENTIAL (CANCER CENTER ONLY)
BASOS ABS: 0 10*3/uL (ref 0.0–0.1)
Basophils Relative: 0 %
EOS ABS: 0 10*3/uL (ref 0.0–0.5)
Eosinophils Relative: 0 %
HCT: 27.2 % — ABNORMAL LOW (ref 34.8–46.6)
HEMOGLOBIN: 8.7 g/dL — AB (ref 11.6–15.9)
LYMPHS ABS: 2.6 10*3/uL (ref 0.9–3.3)
LYMPHS PCT: 54 %
MCH: 28 pg (ref 25.1–34.0)
MCHC: 32 g/dL (ref 31.5–36.0)
MCV: 87.5 fL (ref 79.5–101.0)
Monocytes Absolute: 0.7 10*3/uL (ref 0.1–0.9)
Monocytes Relative: 14 %
NEUTROS ABS: 1.5 10*3/uL (ref 1.5–6.5)
NEUTROS PCT: 32 %
Platelet Count: 49 10*3/uL — ABNORMAL LOW (ref 145–400)
RBC: 3.11 MIL/uL — ABNORMAL LOW (ref 3.70–5.45)
RDW: 17.6 % — ABNORMAL HIGH (ref 11.2–14.5)
WBC Count: 4.7 10*3/uL (ref 3.9–10.3)

## 2017-10-26 LAB — RETICULOCYTES
RBC.: 3.11 MIL/uL — ABNORMAL LOW (ref 3.70–5.45)
RETIC CT PCT: 2.4 % — AB (ref 0.7–2.1)
Retic Count, Absolute: 74.6 10*3/uL (ref 33.7–90.7)

## 2017-10-26 MED ORDER — LORAZEPAM 2 MG/ML IJ SOLN
INTRAMUSCULAR | Status: AC
  Filled 2017-10-26: qty 1

## 2017-10-26 MED ORDER — ACETAMINOPHEN 325 MG PO TABS
ORAL_TABLET | ORAL | Status: AC
  Filled 2017-10-26: qty 2

## 2017-10-26 MED ORDER — FAMOTIDINE IN NACL 20-0.9 MG/50ML-% IV SOLN
INTRAVENOUS | Status: AC
  Filled 2017-10-26: qty 50

## 2017-10-26 MED ORDER — ACETAMINOPHEN 325 MG PO TABS
650.0000 mg | ORAL_TABLET | Freq: Four times a day (QID) | ORAL | Status: DC | PRN
  Administered 2017-10-26: 650 mg via ORAL

## 2017-10-26 MED ORDER — METHYLPREDNISOLONE SODIUM SUCC 125 MG IJ SOLR
125.0000 mg | Freq: Once | INTRAMUSCULAR | Status: AC
  Administered 2017-10-26: 125 mg via INTRAVENOUS

## 2017-10-26 MED ORDER — LORAZEPAM 1 MG PO TABS
0.5000 mg | ORAL_TABLET | Freq: Once | ORAL | Status: AC
  Administered 2017-10-26: 0.5 mg via ORAL

## 2017-10-26 MED ORDER — ACETAMINOPHEN 325 MG PO TABS
650.0000 mg | ORAL_TABLET | Freq: Once | ORAL | Status: AC
  Administered 2017-10-26: 650 mg via ORAL

## 2017-10-26 MED ORDER — SODIUM CHLORIDE 0.9 % IV SOLN
375.0000 mg/m2 | Freq: Once | INTRAVENOUS | Status: AC
  Administered 2017-10-26: 700 mg via INTRAVENOUS
  Filled 2017-10-26: qty 50

## 2017-10-26 MED ORDER — LORAZEPAM 1 MG PO TABS
ORAL_TABLET | ORAL | Status: AC
  Filled 2017-10-26: qty 1

## 2017-10-26 MED ORDER — FAMOTIDINE IN NACL 20-0.9 MG/50ML-% IV SOLN
20.0000 mg | Freq: Once | INTRAVENOUS | Status: AC
  Administered 2017-10-26: 20 mg via INTRAVENOUS

## 2017-10-26 MED ORDER — METHYLPREDNISOLONE SODIUM SUCC 125 MG IJ SOLR
INTRAMUSCULAR | Status: AC
  Filled 2017-10-26: qty 2

## 2017-10-26 MED ORDER — MORPHINE SULFATE (PF) 4 MG/ML IV SOLN
INTRAVENOUS | Status: AC
  Filled 2017-10-26: qty 1

## 2017-10-26 MED ORDER — DIPHENHYDRAMINE HCL 50 MG/ML IJ SOLN
INTRAMUSCULAR | Status: AC
  Filled 2017-10-26: qty 1

## 2017-10-26 MED ORDER — MONTELUKAST SODIUM 10 MG PO TABS
ORAL_TABLET | ORAL | Status: AC
Start: 2017-10-26 — End: 2017-10-26
  Filled 2017-10-26: qty 1

## 2017-10-26 MED ORDER — DIPHENHYDRAMINE HCL 25 MG PO CAPS
ORAL_CAPSULE | ORAL | Status: AC
  Filled 2017-10-26: qty 2

## 2017-10-26 MED ORDER — DIPHENHYDRAMINE HCL 25 MG PO TABS
50.0000 mg | ORAL_TABLET | Freq: Once | ORAL | Status: AC
  Administered 2017-10-26: 50 mg via ORAL

## 2017-10-26 MED ORDER — SODIUM CHLORIDE 0.9 % IV SOLN
Freq: Once | INTRAVENOUS | Status: AC
  Administered 2017-10-26: 13:00:00 via INTRAVENOUS

## 2017-10-26 MED ORDER — MONTELUKAST SODIUM 10 MG PO TABS
10.0000 mg | ORAL_TABLET | Freq: Once | ORAL | Status: AC
  Administered 2017-10-26: 10 mg via ORAL

## 2017-10-26 MED ORDER — MORPHINE SULFATE 4 MG/ML IJ SOLN
2.0000 mg | Freq: Once | INTRAMUSCULAR | Status: AC
Start: 2017-10-26 — End: 2017-10-26
  Administered 2017-10-26: 2 mg via INTRAVENOUS
  Filled 2017-10-26: qty 1

## 2017-10-26 MED ORDER — LORAZEPAM 2 MG/ML IJ SOLN
0.5000 mg | Freq: Once | INTRAMUSCULAR | Status: AC
Start: 2017-10-26 — End: 2017-10-26
  Administered 2017-10-26: 0.5 mg via INTRAVENOUS

## 2017-10-26 MED ORDER — MORPHINE SULFATE 4 MG/ML IJ SOLN
2.0000 mg | Freq: Once | INTRAMUSCULAR | Status: AC
  Administered 2017-10-26: 2 mg via INTRAVENOUS
  Filled 2017-10-26: qty 1

## 2017-10-26 MED ORDER — DIPHENHYDRAMINE HCL 50 MG/ML IJ SOLN
50.0000 mg | Freq: Once | INTRAMUSCULAR | Status: AC
  Administered 2017-10-26: 50 mg via INTRAVENOUS

## 2017-10-26 NOTE — Progress Notes (Signed)
1512 Patient reports "it's happening. I'm feeling hot." Rituxan paused. 1513 NS started running to gravity. Sandi Mealy, PA present. Emergency reaction protocol initiated. Patient symptoms include nausea, vomiting, back pain, "hot feeling" with diaphoresis. 1521 Patient verbalized "I want to keep going." Dr. Irene Limbo updated on situation per Sandi Mealy and gives "ok to proceed if patient wants" 1524 Patient verbalized "I'm feeling much better". 1534 patient reports complete resolution of symptoms. Patient reports "That was mild". Per Sandi Mealy, resume infusion and previous rate. Patient's support person at chairside during this referenced interaction.

## 2017-10-26 NOTE — Patient Instructions (Signed)
[facility] Discharge Instructions for Patients Receiving Chemotherapy  Today you received the following chemotherapy agents:  Rituxan.  To help prevent nausea and vomiting after your treatment, we encourage you to take your nausea medication as directed.   If you develop nausea and vomiting that is not controlled by your nausea medication, call the clinic.   BELOW ARE SYMPTOMS THAT SHOULD BE REPORTED IMMEDIATELY:  *FEVER GREATER THAN 100.5 F  *CHILLS WITH OR WITHOUT FEVER  NAUSEA AND VOMITING THAT IS NOT CONTROLLED WITH YOUR NAUSEA MEDICATION  *UNUSUAL SHORTNESS OF BREATH  *UNUSUAL BRUISING OR BLEEDING  TENDERNESS IN MOUTH AND THROAT WITH OR WITHOUT PRESENCE OF ULCERS  *URINARY PROBLEMS  *BOWEL PROBLEMS  UNUSUAL RASH Items with * indicate a potential emergency and should be followed up as soon as possible.  Feel free to call the clinic should you have any questions or concerns. The clinic phone number is [phone].  Please show the CHEMO ALERT CARD at check-in to the Emergency Department and triage nurse.   

## 2017-10-26 NOTE — Progress Notes (Signed)
Patient gives report of identification of scattered bruising since last Rituxan treatment. Patient shows bruising on her forearms and lower legs. Patient and support person attributes bruising on forearms, consistent with locations that patient scratched during last Rituxan treatment. Dr. Irene Limbo visited patient in infusion room; assessment and discussion provided. Gives okay to proceed with treatment with 10/26/17 labs and recommendation to stop treatment if patient develops any reaction.

## 2017-10-27 NOTE — Progress Notes (Signed)
[facility] Progress Note   Michelle Cohen [id number] 09/03/1951 67 y.o.  Michelle Cohen is managed by  Dr. Sullivan Lone  Actively treated with chemotherapy: yes  Current Therapy: Rituximab  Last Treated: 10/26/2017  Assessment: Plan:    Chemotherapy adverse reaction, initial encounter  Michelle Cohen was seen in the infusion room for a suspected chemotherapy reaction. She was receiving rituximab at the time of her reaction. She was on her second bump up and was receiving 100 mg/h.  She had received approximately 10 mL prior to onset of symptoms. Her symptoms included: Hot flashes and back pain.  The patient developed nausea and vomiting after morphine sulfate 2 mg IV was given for her back pain. She was premedicated with Pepcid, Solu-Medrol, Tylenol, Benadryl, Singulair, Ativan, and morphine.  Prior to starting chemotherapy. Rituximab was paused and Michelle Cohen was given morphine 2 mg IV, Pepcid 20 mg IV, and Ativan 0.5 mg IV after onset of her symptoms. Michelle Cohen did  respond to intervention.  The patient's symptoms abated.  She was able to complete her infusion.  Please see After Visit Summary for patient specific instructions.  Future Appointments  Date Time Provider [place]  11/09/2017  9:00 AM [facility] LAB 2 [facility] None  11/09/2017  9:40 AM Brunetta Genera, MD [facility] None  11/20/2017 11:00 AM [facility] INJ NURSE [facility] None    No orders of the defined types were placed in this encounter.      Subjective:   Patient ID:  Michelle Cohen is a 67 y.o. (DOB Feb 06, 1951) female.  Chief Complaint: No chief complaint on file.   HPI Michelle Cohen was seen in the infusion room for a suspected chemotherapy reaction. She was receiving rituximab at the time of her reaction. She was on her second bump up and was receiving 100 mg/h.  She had received approximately 10 mL prior to onset of symptoms. Her symptoms included:  Hot flashes and back pain.  The patient developed nausea and vomiting after morphine sulfate 2 mg IV was given for her back pain. She was premedicated with Pepcid, Solu-Medrol, Tylenol, Benadryl, Singulair, Ativan, and morphine.  Prior to starting chemotherapy. Rituximab was paused and Michelle Cohen was given morphine 2 mg IV, Pepcid 20 mg IV, and Ativan 0.5 mg IV after onset of her symptoms. Michelle Cohen did  respond to intervention.  The patient's symptoms abated.  She was able to complete her infusion.  Medications: I have reviewed the patient's current medications.  Allergies: No Known Allergies  Past Medical History:  Diagnosis Date  . B-cell lymphoma ([place]) 06/2017    Past Surgical History:  Procedure Laterality Date  . left inguinal hernia repair Left     Family History  Problem Relation Age of Onset  . Lung cancer Sister     Social History   Socioeconomic History  . Marital status: Single    Spouse name: Not on file  . Number of children: Not on file  . Years of education: Not on file  . Highest education level: Not on file  Social Needs  . Financial resource strain: Not on file  . Food insecurity - worry: Not on file  . Food insecurity - inability: Not on file  . Transportation needs - medical: Not on file  . Transportation needs - non-medical: Not on file  Occupational History  . Not on file  Tobacco Use  . Smoking status: Never Smoker  . Smokeless tobacco: Never Used  Substance and  Sexual Activity  . Alcohol use: Yes    Comment: glass of wine occasionally  . Drug use: No  . Sexual activity: Not on file  Other Topics Concern  . Not on file  Social History Narrative  . Not on file    Past Medical History, Surgical history, Social history, and Family history were reviewed and updated as appropriate.   Please see review of systems for further details on the patient's review from today.   Review of Systems:  Review of Systems  Constitutional: Negative  for chills, diaphoresis and fever.       Hot flashes.  HENT: Negative for trouble swallowing and voice change.   Respiratory: Negative for cough, choking, chest tightness, shortness of breath and wheezing.   Cardiovascular: Negative for chest pain and palpitations.  Gastrointestinal: Positive for nausea and vomiting (Nausea and vomiting occurred after the patient received morphine sulfate 2 mg IV x1.). Negative for abdominal pain.  Musculoskeletal: Positive for back pain.  Skin: Negative for rash.  Neurological: Negative for light-headedness and headaches.  Psychiatric/Behavioral: Negative for confusion.    Objective:   Physical Exam:  There were no vitals taken for this visit.  Physical Exam  Constitutional: No distress.  HENT:  Head: Normocephalic.  Cardiovascular: Normal rate, regular rhythm and normal heart sounds. Exam reveals no gallop and no friction rub.  No murmur heard. Pulmonary/Chest: Effort normal and breath sounds normal. No respiratory distress. She has no wheezes. She has no rales.  The patient has an accessed right chest wall Port-A-Cath which shows no erythema, increased warmth, or exudate.  Neurological: She is alert.  Skin: Skin is warm and dry. No rash noted. She is not diaphoretic.  Mild flushing of the cheeks bilaterally.    Lab Review:     Component Value Date/Time   NA 138 10/26/2017 1100   NA 138 09/14/2017 0835   K 4.6 10/26/2017 1100   K 4.6 09/14/2017 0835   CL 104 10/26/2017 1100   CO2 25 10/26/2017 1100   CO2 23 09/14/2017 0835   GLUCOSE 91 10/26/2017 1100   GLUCOSE 126 09/14/2017 0835   BUN 13 10/26/2017 1100   BUN 13.5 09/14/2017 0835   CREATININE 0.89 10/04/2017 0811   CREATININE 0.9 09/14/2017 0835   CALCIUM 9.0 10/26/2017 1100   CALCIUM 8.7 09/14/2017 0835   PROT 7.0 10/26/2017 1100   PROT 6.5 09/14/2017 0835   ALBUMIN 3.3 (L) 10/26/2017 1100   ALBUMIN 3.5 09/14/2017 0835   AST 21 10/26/2017 1100   AST 13 09/14/2017 0835   ALT  12 10/26/2017 1100   ALT 7 09/14/2017 0835   ALKPHOS 68 10/26/2017 1100   ALKPHOS 50 09/14/2017 0835   BILITOT 1.8 (H) 10/26/2017 1100   BILITOT 2.23 (H) 09/14/2017 0835   GFRNONAA >60 10/26/2017 1100   GFRAA >60 10/26/2017 1100       Component Value Date/Time   WBC 4.7 10/26/2017 1100   WBC 4.0 09/15/2017 1704   RBC 3.11 (L) 10/26/2017 1100   RBC 3.11 (L) 10/26/2017 1100   HGB 9.3 (L) 09/15/2017 1704   HGB 9.1 (L) 09/14/2017 0835   HCT 27.2 (L) 10/26/2017 1100   HCT 29.0 (L) 09/14/2017 0835   PLT 49 (L) 10/26/2017 1100   PLT 46 (L) 09/14/2017 0835   MCV 87.5 10/26/2017 1100   MCV 92.1 09/14/2017 0835   MCH 28.0 10/26/2017 1100   MCHC 32.0 10/26/2017 1100   RDW 17.6 (H) 10/26/2017 1100   RDW  18.2 (H) 09/14/2017 0835   LYMPHSABS 2.6 10/26/2017 1100   LYMPHSABS 2.0 09/14/2017 0835   MONOABS 0.7 10/26/2017 1100   MONOABS 0.3 09/14/2017 0835   EOSABS 0.0 10/26/2017 1100   EOSABS 0.0 09/14/2017 0835   BASOSABS 0.0 10/26/2017 1100   BASOSABS 0.0 09/14/2017 0835   -------------------------------  Imaging from last 24 hours (if applicable):  Radiology interpretation: No results found.

## 2017-11-06 ENCOUNTER — Other Ambulatory Visit: Payer: Self-pay | Admitting: Hematology

## 2017-11-06 MED FILL — ACYCLOVIR 200 MG CAP: 200 | 15 days supply | Qty: 60 | Fill #0

## 2017-11-06 MED FILL — FOLIC ACID 1 MG TABLET: 1 | 30 days supply | Qty: 30 | Fill #1

## 2017-11-08 NOTE — Progress Notes (Signed)
HEMATOLOGY/ONCOLOGY FOLLOW UP NOTE  Date of Service: 11/09/2017  Patient Care Team: Patient, No Pcp Per as PCP - General (General Practice)  CHIEF COMPLAINTS F/u for SMZL  HISTORY OF PRESENTING ILLNESS:   Michelle Cohen is a wonderful 67 y.o. female who has been referred to [place] from [facility] for evaluation and management of B-cell lymphoma. She presents to her appointment today with her partner of 21 years. She states that she moved to the area ~2 years ago secondary to her sister's diagnosis of lung cancer (unfortunately, now passed) and taking care of her mother's Alzheimer's. Generally, prior to this issue she has been very healthy and without chronic medical conditions and not on chronic medical therapies/treatments.   Initially, the patient was admitted to [facility] for abdominal pain and significant hernia. She reports that she was visiting in [place] and while flying there she had an acute onset of sweats and left lower quadrant pain. She reports that her hernia was not present at that time, but as she continued to fly her hernia had become more noticeable and enlarged in her lower abdomen. Additionally, her partner reports that her episode of hydrosis was very significant, stating that it was pooling below her. On their arrival to [facility] on 06/21/17, a CT A/P was performed which showed an incarcerated hernia. Of note, her CT showed massive splenomegaly at 32cm in greatest dimension. Manual reduction of her hernia was attempted while in the ED but was unsuccessful. Pre-operative notes state that following intubation manual reduction was performed successfully and hernia was repaired laparoscopically. CT CAP was performed as well which was without the presence of additional surrounding LAD. She did have flow cytometry performed during her admission as well which showed B-cell lymphocytes which were monoclonal. A bone marrow biopsy was also examined which showed  that it was markedly hypercellular (~95%) with prominent interstitial infiltrate of small lymphocytes.   Prior to her diagnosis, she mostly felt typically well overall. She states that she did suffer from insomnia which she related to over-thinking at the time regarding her familial situation. She did experience some fatigue and weight loss with this as well. Her partner reports that she noticed her weight loss approximately 68moago, which she quantifies at approximately 30lbs since onset. Towards the end of this six month periods and just prior to the appearance of her hernia the patient began to notice some bothersome abdominal distension. She also noticed some significant fatigue and she would nap for longer periods following her daily workouts. Additionally, her partner would notice that she would have issues with breathing and this appeared irregular and more labored, especially at night while laying down. She also mentions that she did experience intermittent night sweats throughout this six month period as well. She denies lightheadedness, dizziness, abdominal pain, back pain, fever, chills, abnormal bruising/bleeding, bloody stools, epistaxis, hematuria, gingival bleeding, or other bleeding issues within this period. She has not received any blood transfusions and has not previously had a tattoo; she is unconcerned with prior HepC exposure.   Since her hernia surgery, she reports that she feels great symptomatically overall and much improved. She does have some issues with abdominal bloating and discomfort, but this has been manageable. No overt abdominal pain.   Throughout all of this, she has attempted to try to remain within good spirits. Losing her sister recently has been hard, but she has been working through it with her family; however, her partner voices that  they do not have strong social support in this area as most of their support is in [place] where they previously lived. She has been  working through her sister's death with grief counseling which has been helping her.    She has never previously smoked, but she reports some moderate second-hand smoke through her father at a younger age. Her father did die of a PE, but she is unaware of any other diagnoses of clotting disorders or cancers within her family. No h/o alcohol abuse, chemical/radiation exposures. She worked previously in Industrial/product designer.   PREVIOUS TREATMENT:  Rituximab weekly x4 beginning on 07/21/17-08/11/17   CURRENT TREATMENT:  Rituximab  Weekly for 4 weeks starting 10/05/17  INTERIM HISTORY:   Michelle Cohen returns today for scheduled follow up of her SMZL. The patient's last visit with [place] was on 10/19/17. She is accompanied today by her partner. The pt reports that she is doing well overall but over the last two weeks has been very tired. She reports that her abdomen has been tight, large, uncomfortable and distended; she notes it does not correlate with the timing of her meals. She notes some relief from the discomfort of her tight abdomen when she lies on her side; she denies that it is painful. She notes moving her bowels regularly, with some soft stools. She notes that her muscle and joint pain has resolved after her last cycle. She reports not feeling uncomfortable in her abdomen today. She reports sleeping well. She reports that she is taking a B complex vitamin, and folic acid once per day.  Her partner reports that the patient's resolve and attitude is excellent.   Lab results today (11/09/17) of CBC, CMP, and Reticulocytes is as follows: all values are WNL except for WBC at 3.8k, RBC at 2.71, Hgb at 7.6, HCT at 24.3, MCHC at 31.3, RDW at 19.4, Platelets at 57k, Neutro Abs at 1.0k, Retic Ct Pct at 3.0, and Total Bilirubin at 2.2.  On review of systems, pt reports sleeping well, night sweats, fatigue, regular bowel movements and urination, and denies abdominal discomfort, dizziness,  nausea, vomiting, nose bleeds, gum bleeds, bleeding, blood in the stools, lower abdomen pain, leg swelling, rashes, groin pain, pain over the spleen, and any other symptoms, hot flashes, fevers, chills, and any other symptoms.   MEDICAL HISTORY:  Past Medical History:  Diagnosis Date  . B-cell lymphoma ([place]) 06/2017    SURGICAL HISTORY: Past Surgical History:  Procedure Laterality Date  . left inguinal hernia repair Left     SOCIAL HISTORY: Social History   Socioeconomic History  . Marital status: Single    Spouse name: Not on file  . Number of children: Not on file  . Years of education: Not on file  . Highest education level: Not on file  Social Needs  . Financial resource strain: Not on file  . Food insecurity - worry: Not on file  . Food insecurity - inability: Not on file  . Transportation needs - medical: Not on file  . Transportation needs - non-medical: Not on file  Occupational History  . Not on file  Tobacco Use  . Smoking status: Never Smoker  . Smokeless tobacco: Never Used  Substance and Sexual Activity  . Alcohol use: Yes    Comment: glass of wine occasionally  . Drug use: No  . Sexual activity: Not on file  Other Topics Concern  . Not on file  Social History Narrative  . Not  on file    FAMILY HISTORY: Sister - lung cancer Mother -Alzheimers dementia  ALLERGIES:  has No Known Allergies.  MEDICATIONS:  Current Outpatient Medications  Medication Sig Dispense Refill  . acetaminophen (TYLENOL) 500 MG tablet Take 500 mg by mouth every 8 (eight) hours as needed for moderate pain.     Marland Kitchen acyclovir (ZOVIRAX) 200 MG capsule TAKE 2 CAPSULES BY MOUTH TWICE DAILY 60 capsule 0  . allopurinol (ZYLOPRIM) 100 MG tablet Take 100 mg by mouth daily.   2  . docusate sodium (COLACE) 100 MG capsule Take 100 mg by mouth 2 (two) times daily.    . folic acid (FOLVITE) 1 MG tablet Take 1 tablet (1 mg total) by mouth daily. 30 tablet 2  . LORazepam (ATIVAN) 0.5 MG tablet  Take 1 tablet (0.5 mg total) by mouth every 8 (eight) hours as needed for anxiety. 30 tablet 0  . magic mouthwash w/lidocaine SOLN Take 5 mLs by mouth 4 (four) times daily as needed for mouth pain. Swish and spit 120 mL 0  . senna (SENOKOT) 8.6 MG TABS tablet Take 1 tablet by mouth daily as needed for mild constipation.      No current facility-administered medications for this visit.     REVIEW OF SYSTEMS:    .10 Point review of Systems was done is negative except as noted above.   PHYSICAL EXAMINATION:  ECOG PERFORMANCE STATUS: 1 . GENERAL:alert, in no acute distress and comfortable SKIN: no acute rashes, no significant lesions EYES: conjunctiva are pink and non-injected, sclera anicteric OROPHARYNX: MMM, no exudates, no oropharyngeal erythema or ulceration NECK: supple, no JVD LYMPH:  no palpable lymphadenopathy in the cervical, axillary or inguinal regions LUNGS: clear to auscultation b/l with normal respiratory effort HEART: regular rate & rhythm ABDOMEN:  normoactive bowel sounds ,distended,massive splenomegaly Extremity: no pedal edema PSYCH: alert & oriented x 3 with fluent speech NEURO: no focal motor/sensory deficits   LABORATORY DATA:  I have reviewed the data as listed   CBC Latest Ref Rng & Units 11/09/2017 10/26/2017 10/19/2017  WBC 3.9 - 10.3 K/uL 3.8(L) 4.7 4.4  Hemoglobin 12.0 - 15.0 g/dL - - -  Hematocrit 34.8 - 46.6 % 24.3(L) 27.2(L) 29.2(L)  Platelets 145 - 400 K/uL 57(L) 49(L) 87(L)   . CBC    Component Value Date/Time   WBC 3.8 (L) 11/09/2017 0924   WBC 4.0 09/15/2017 1704   RBC 2.71 (L) 11/09/2017 0924   RBC 2.71 (L) 11/09/2017 0924   HGB 9.3 (L) 09/15/2017 1704   HGB 9.1 (L) 09/14/2017 0835   HCT 24.3 (L) 11/09/2017 0924   HCT 29.0 (L) 09/14/2017 0835   PLT 57 (L) 11/09/2017 0924   PLT 46 (L) 09/14/2017 0835   MCV 89.7 11/09/2017 0924   MCV 92.1 09/14/2017 0835   MCH 28.0 11/09/2017 0924   MCHC 31.3 (L) 11/09/2017 0924   RDW 19.4 (H)  11/09/2017 0924   RDW 18.2 (H) 09/14/2017 0835   LYMPHSABS 2.3 11/09/2017 0924   LYMPHSABS 2.0 09/14/2017 0835   MONOABS 0.4 11/09/2017 0924   MONOABS 0.3 09/14/2017 0835   EOSABS 0.0 11/09/2017 0924   EOSABS 0.0 09/14/2017 0835   BASOSABS 0.0 11/09/2017 0924   BASOSABS 0.0 09/14/2017 0835    . CMP Latest Ref Rng & Units 11/09/2017 10/26/2017 10/19/2017  Glucose 70 - 140 mg/dL 106 91 91  BUN 7 - 26 mg/dL _0 Creatinine 0.60 - 1.10 mg/dL 0.87 0.79 0.82  Sodium 136 -  145 mmol/L 137 138 139  Potassium 3.5 - 5.1 mmol/L 4.2 4.6 4.5  Chloride 98 - 109 mmol/L 104 104 106  CO2 22 - 29 mmol/L _0 Calcium 8.4 - 10.4 mg/dL 8.6 9.0 8.9  Total Protein 6.4 - 8.3 g/dL 6.8 7.0 6.9  Total Bilirubin 0.2 - 1.2 mg/dL 2.2(H) 1.8(H) 1.8(H)  Alkaline Phos 40 - 150 U/L 61 68 58  AST 5 - 34 U/L _1 ALT 0 - 55 U/L <_2 Component     Latest Ref Rng & Units 07/13/2017  LDH     125 - 245 U/L 232  Hep C Virus Ab     0.0 - 0.9 s/co ratio <0.1  HIV Screen 4th Generation wRfx     Non Reactive Non Reactive  Hep B Core Ab, Tot     Negative Negative  Hepatitis B Surface Ag     Negative Negative   Component     Latest Ref Rng & Units 07/28/2017  LDH     125 - 245 U/L 200  Uric Acid, Serum     2.6 - 7.4 mg/dl 6.0  Phosphorus     2.5 - 4.5 mg/dL 3.8      RADIOGRAPHIC STUDIES: I have personally reviewed the radiological images as listed and agreed with the findings in the report. No results found. ASSESSMENT & PLAN:   Michelle Cohen is a wonderful 67 y.o. caucasian female who presents to our clinic to discuss ongoing management of the following:   1.  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  Her clinical presentation is also representative of this and that this may have been present over months to possibly years.  -With her pattern of involvement and results thus far it is likely that she has splenic marginal zone type lymphoma.  -PET scan on 07/25/2017 with results showing: Massive splenomegaly, splenic volume 5770 cubic cm, with homogeneous low grade activity throughout the spleen equal to that of the physiologic activity in the liver, and above the background blood pool activity in the mediastinum. No pathologic adenopathy identified. No bony involvement noted.   -CT A/p on 08/23/17 with results of: Marked splenomegaly without substantial interval change in splenic size/volume. Borderline lymphadenopathy in the hepatoduodenal ligament. Aortic Atherosclerosis. I discussed this with the patient in great detail.  -CT A/p on 09/15/17 shows significant splenomegaly shows minimal smaller change in size, with no evidence of bowel obstruction.   -Pt discussed the splenectomy with Dr. Barry Dienes and would like to forgo surgery for now and proceed with another round of Rituxan.   Plan -received C8 of Rituxan (had mild infusion reaction characterized by some nausea/flushing and back cramps) - resolved rapidly with benadryl 62m/Morphine 217mand zofran 68m65mnd patient was able to continue and finish the remaing infusion. (similar to previous reaction - despite significant premedication). -will plan to reassess spleen size in 2 months -Discussed the pts last chemotherapy reaction and will move up her morphine to the front of her Rituxan treatment to offset her reactionary symptoms that occur during the first 15 minutes of her chemo treatments.   2. Pancytopenia with leukocytosis and anemia  Due to SMZL and hypersplenism -If  Hgb falls below <7 we will consider blood transfusion.  Plan -neutropenia ANC overall stable at 1.0 today -- I have recommended infection precautions. -cannot safely use  neulasta due to splenic size and risk for splenic rupture. Will need appropriate antibiotic prophylaxis if she chooses to proceed with splenectomy - got 1st round of injections. -thrombocytopenia -platelets at 57k today  -Discussed pt labwork today; platelets and WBC are stable.  -Hgb has been fluctuating but is at 7.6 today and I offered her a blood transfusion 1 unit today 11/09/17; she prefers to do this.   -Repeat labs in 3 weeks  -Offered her a referral to [facility] again for evaluation of a splenectomy and she denies wanting to do this right now; and prefers to maintain her wait and watch approach to see how well Rituxan alone works. -Need for CT scan in 6 weeks with labs; she wants to wait until then to decide upon further treatment options and splenectomy, and notes that if the next CT scan reveals that her spleen has not shrunk then she will likely want a splenectomy.  -She will be getting the rest of her pre-splenectomy vaccines at the end of February.  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 Had to complete Rituxan at lower rate. Patient would not be a candidate for rapid infusion protocol and would need to keep the next infusion at the tolerated rate and not rate escalate. Plan  -continue extensive pre-medications as currently -added morphine since she tends to have painful muscle cramping. -will continue to monitor for this.  4. Oral mucositis/HSV outbreak -continue with acyclovir for prophylaxis -this has been improving throughout treatment and is was resolved.  -She has another episode of mucositis, I have refilled her magic mouthwash and continue acyclovir.   PRBC transfusion Labs in 3 weeks Labs in 6 weeks CT abd/pelvis in 6 weeks RTC with Dr Irene Limbo in 7 weeks    All of the patients questions were answered with apparent satisfaction. The patient knows to  call the clinic with any problems, questions or concerns. . The total time spent in the appointment was 15 minutes and more than 50% was on counseling and direct patient cares.    Sullivan Lone MD [place] AAHIVMS [facility] Hematology/Oncology Physician [facility]  (Office):       [phone] (Work cell):  [phone] (Fax):           [phone]  This document serves as a record of services personally performed by Sullivan Lone, MD. It was created on his behalf by Baldwin Jamaica, a trained medical scribe. The creation of this record is based on the scribe's personal observations and the provider's statements to them.   .I have reviewed the above documentation for accuracy and completeness, and I agree with the above. Brunetta Genera MD MS

## 2017-11-09 ENCOUNTER — Telehealth: Payer: Self-pay

## 2017-11-09 ENCOUNTER — Inpatient Hospital Stay: Payer: Self-pay | Attending: Hematology | Admitting: Hematology

## 2017-11-09 ENCOUNTER — Ambulatory Visit (HOSPITAL_COMMUNITY)
Admission: RE | Admit: 2017-11-09 | Discharge: 2017-11-09 | Disposition: A | Discharge status: Home / Self Care | Payer: Self-pay | Source: Ambulatory Visit | Attending: Hematology | Admitting: Hematology

## 2017-11-09 ENCOUNTER — Inpatient Hospital Stay: Payer: Self-pay

## 2017-11-09 ENCOUNTER — Telehealth: Payer: Self-pay | Admitting: Hematology

## 2017-11-09 ENCOUNTER — Other Ambulatory Visit: Payer: Self-pay

## 2017-11-09 ENCOUNTER — Encounter: Payer: Self-pay | Admitting: Hematology

## 2017-11-09 VITALS — BP 117/73 | HR 83 | Temp 98.4°F | Resp 18 | Ht 66.0 in | Wt 145.7 lb

## 2017-11-09 DIAGNOSIS — R140 Abdominal distension (gaseous): Secondary | ICD-10-CM | POA: Insufficient documentation

## 2017-11-09 DIAGNOSIS — C8307 Small cell B-cell lymphoma, spleen: Secondary | ICD-10-CM | POA: Insufficient documentation

## 2017-11-09 DIAGNOSIS — Z23 Encounter for immunization: Secondary | ICD-10-CM | POA: Insufficient documentation

## 2017-11-09 DIAGNOSIS — D649 Anemia, unspecified: Secondary | ICD-10-CM

## 2017-11-09 DIAGNOSIS — D696 Thrombocytopenia, unspecified: Secondary | ICD-10-CM

## 2017-11-09 DIAGNOSIS — D61818 Other pancytopenia: Secondary | ICD-10-CM | POA: Insufficient documentation

## 2017-11-09 DIAGNOSIS — D709 Neutropenia, unspecified: Secondary | ICD-10-CM | POA: Insufficient documentation

## 2017-11-09 DIAGNOSIS — Z79899 Other long term (current) drug therapy: Secondary | ICD-10-CM | POA: Insufficient documentation

## 2017-11-09 DIAGNOSIS — G4700 Insomnia, unspecified: Secondary | ICD-10-CM | POA: Insufficient documentation

## 2017-11-09 DIAGNOSIS — K1230 Oral mucositis (ulcerative), unspecified: Secondary | ICD-10-CM | POA: Insufficient documentation

## 2017-11-09 LAB — CBC WITH DIFFERENTIAL (CANCER CENTER ONLY)
Basophils Absolute: 0 10*3/uL (ref 0.0–0.1)
Basophils Relative: 0 %
EOS ABS: 0 10*3/uL (ref 0.0–0.5)
EOS PCT: 1 %
HCT: 24.3 % — ABNORMAL LOW (ref 34.8–46.6)
Hemoglobin: 7.6 g/dL — ABNORMAL LOW (ref 11.6–15.9)
LYMPHS ABS: 2.3 10*3/uL (ref 0.9–3.3)
LYMPHS PCT: 61 %
MCH: 28 pg (ref 25.1–34.0)
MCHC: 31.3 g/dL — AB (ref 31.5–36.0)
MCV: 89.7 fL (ref 79.5–101.0)
MONO ABS: 0.4 10*3/uL (ref 0.1–0.9)
MONOS PCT: 11 %
Neutro Abs: 1 10*3/uL — ABNORMAL LOW (ref 1.5–6.5)
Neutrophils Relative %: 27 %
PLATELETS: 57 10*3/uL — AB (ref 145–400)
RBC: 2.71 MIL/uL — ABNORMAL LOW (ref 3.70–5.45)
RDW: 19.4 % — AB (ref 11.2–14.5)
WBC Count: 3.8 10*3/uL — ABNORMAL LOW (ref 3.9–10.3)

## 2017-11-09 LAB — CMP (CANCER CENTER ONLY)
ALK PHOS: 61 U/L (ref 40–150)
ALT: <6 U/L (ref 0–55)
AST: 14 U/L (ref 5–34)
Albumin: 3.5 g/dL (ref 3.5–5.0)
Anion gap: 11 (ref 3–11)
BUN: 19 mg/dL (ref 7–26)
CALCIUM: 8.6 mg/dL (ref 8.4–10.4)
CHLORIDE: 104 mmol/L (ref 98–109)
CO2: 22 mmol/L (ref 22–29)
CREATININE: 0.87 mg/dL (ref 0.60–1.10)
GFR, Est AFR Am: >60 mL/min (ref >60)
GFR, Estimated: >60 mL/min (ref >60)
GLUCOSE: 106 mg/dL (ref 70–140)
Potassium: 4.2 mmol/L (ref 3.5–5.1)
SODIUM: 137 mmol/L (ref 136–145)
Total Bilirubin: 2.2 mg/dL — ABNORMAL HIGH (ref 0.2–1.2)
Total Protein: 6.8 g/dL (ref 6.4–8.3)

## 2017-11-09 LAB — LACTATE DEHYDROGENASE: LDH: 239 U/L (ref 125–245)

## 2017-11-09 LAB — PREPARE RBC (CROSSMATCH)

## 2017-11-09 LAB — RETICULOCYTES
RBC.: 2.71 MIL/uL — ABNORMAL LOW (ref 3.70–5.45)
Retic Count, Absolute: 81.3 10*3/uL (ref 33.7–90.7)
Retic Ct Pct: 3 % — ABNORMAL HIGH (ref 0.7–2.1)

## 2017-11-09 LAB — ABO/RH: ABO/RH(D): A POS

## 2017-11-09 MED ORDER — DIPHENHYDRAMINE HCL 25 MG PO CAPS
25.0000 mg | ORAL_CAPSULE | Freq: Once | ORAL | Status: AC
  Administered 2017-11-09: 25 mg via ORAL
  Filled 2017-11-09: qty 1

## 2017-11-09 MED ORDER — ACETAMINOPHEN 325 MG PO TABS
650.0000 mg | ORAL_TABLET | Freq: Once | ORAL | Status: AC
  Administered 2017-11-09: 650 mg via ORAL
  Filled 2017-11-09: qty 2

## 2017-11-09 MED ORDER — HEPARIN SOD (PORK) LOCK FLUSH 100 UNIT/ML IV SOLN: 500.0000 [IU] | Freq: Every day | INTRAVENOUS | Status: DC | PRN

## 2017-11-09 MED ORDER — SODIUM CHLORIDE 0.9% FLUSH: 10.0000 mL | INTRAVENOUS | Status: DC | PRN

## 2017-11-09 MED ORDER — SODIUM CHLORIDE 0.9 % IV SOLN
250.0000 mL | Freq: Once | INTRAVENOUS | Status: AC
  Administered 2017-11-09: 250 mL via INTRAVENOUS

## 2017-11-09 NOTE — Telephone Encounter (Signed)
Scheduled appt per 2/14 los - Gave patient AVS and calender per los.  

## 2017-11-09 NOTE — Discharge Instructions (Signed)

## 2017-11-09 NOTE — Telephone Encounter (Signed)
Pt to receive 1U PRBCs per Dr. Irene Limbo. Orders placed. Appt secured at [facility] at 1200. Lab add-on at 1045 to draw type and screen. Called [facility] to notify of 1U to be expected today. Spoke with Sherlynn Stalls, who confirmed visual on Prepare and Type and Screen orders. Pt and partner made aware of plan and map given to best locate [facility]. Pt taken to lab to have Type and Screen drawn.

## 2017-11-09 NOTE — Progress Notes (Signed)
PATIENT CARE CENTER NOTE  Diagnosis: Anemia    Provider: Dr. Irene Limbo   Procedure: Transfusion of 1 unit PRBC's   Note: Patient received transfusion of 1 unit of blood. Patient tolerated transfusion well with no adverse reaction. Vitals remained stable. Discharge instructions given to patient. Patient alert, oriented and ambulatory at discharge.

## 2017-11-11 LAB — TYPE AND SCREEN
ABO/RH(D): A POS
Antibody Screen: NEGATIVE
Unit division: 0

## 2017-11-11 LAB — BPAM RBC
Blood Product Expiration Date: 201903042359
ISSUE DATE / TIME: 201902141300
Unit Type and Rh: 6200

## 2017-11-17 ENCOUNTER — Other Ambulatory Visit: Payer: Self-pay | Admitting: Hematology

## 2017-11-17 DIAGNOSIS — C8307 Small cell B-cell lymphoma, spleen: Secondary | ICD-10-CM

## 2017-11-17 MED ORDER — PNEUMOCOCCAL VAC POLYVALENT 25 MCG/0.5ML IJ INJ
0.5000 mL | INJECTION | INTRAMUSCULAR | Status: DC
  Filled 2017-11-17: qty 0.5

## 2017-11-17 MED ORDER — MENINGOCOCCAL VAC B (OMV) IM SUSY: 0.5000 mL | PREFILLED_SYRINGE | Freq: Once | INTRAMUSCULAR | Status: DC

## 2017-11-17 MED ORDER — MENINGOCOCCAL A C Y&W-135 OLIG IM SOLR
0.5000 mL | Freq: Once | INTRAMUSCULAR | Status: DC
  Filled 2017-11-17: qty 0.5

## 2017-11-17 MED ORDER — MENINGOCOCCAL VAC B (OMV) IM SUSY
0.5000 mL | PREFILLED_SYRINGE | Freq: Once | INTRAMUSCULAR | Status: DC
  Filled 2017-11-17: qty 0.5

## 2017-11-17 MED ORDER — PNEUMOCOCCAL VAC POLYVALENT 25 MCG/0.5ML IJ INJ
0.5000 mL | INJECTION | Freq: Once | INTRAMUSCULAR | Status: DC
  Filled 2017-11-17: qty 0.5

## 2017-11-20 ENCOUNTER — Inpatient Hospital Stay: Payer: Self-pay

## 2017-11-20 ENCOUNTER — Other Ambulatory Visit: Payer: Self-pay

## 2017-11-20 ENCOUNTER — Other Ambulatory Visit: Payer: Self-pay | Admitting: Hematology

## 2017-11-20 VITALS — BP 109/63 | HR 76 | Temp 98.0°F | Resp 16

## 2017-11-20 DIAGNOSIS — C8307 Small cell B-cell lymphoma, spleen: Secondary | ICD-10-CM

## 2017-11-20 DIAGNOSIS — R161 Splenomegaly, not elsewhere classified: Secondary | ICD-10-CM

## 2017-11-20 MED ORDER — MENINGOCOCCAL VAC B (OMV) IM SUSY
0.5000 mL | PREFILLED_SYRINGE | Freq: Once | INTRAMUSCULAR | Status: AC
  Administered 2017-11-20: 0.5 mL via INTRAMUSCULAR
  Filled 2017-11-20: qty 0.5

## 2017-11-20 MED ORDER — MENINGOCOCCAL A C Y&W-135 OLIG IM SOLR
0.5000 mL | Freq: Once | INTRAMUSCULAR | Status: AC
  Administered 2017-11-20: 0.5 mL via INTRAMUSCULAR
  Filled 2017-11-20: qty 0.5

## 2017-11-20 MED ORDER — PNEUMOCOCCAL VAC POLYVALENT 25 MCG/0.5ML IJ INJ
0.5000 mL | INJECTION | Freq: Once | INTRAMUSCULAR | Status: AC
  Administered 2017-11-20: 0.5 mL via INTRAMUSCULAR
  Filled 2017-11-20: qty 0.5

## 2017-11-20 MED ORDER — ACYCLOVIR 200 MG PO CAPS: 400.0000 mg | ORAL_CAPSULE | Freq: Two times a day (BID) | ORAL | 1 refills | Status: DC

## 2017-11-20 MED FILL — ACYCLOVIR 200 MG CAP: 200 | 15 days supply | Qty: 60 | Fill #0

## 2017-11-30 ENCOUNTER — Inpatient Hospital Stay: Payer: Self-pay | Attending: Hematology

## 2017-11-30 DIAGNOSIS — C8307 Small cell B-cell lymphoma, spleen: Secondary | ICD-10-CM | POA: Insufficient documentation

## 2017-11-30 LAB — CBC WITH DIFFERENTIAL (CANCER CENTER ONLY)
BASOS ABS: 0 10*3/uL (ref 0.0–0.1)
Basophils Relative: 0 %
EOS PCT: 1 %
Eosinophils Absolute: 0 10*3/uL (ref 0.0–0.5)
HEMATOCRIT: 30.1 % — AB (ref 34.8–46.6)
Hemoglobin: 9.2 g/dL — ABNORMAL LOW (ref 11.6–15.9)
LYMPHS ABS: 2.1 10*3/uL (ref 0.9–3.3)
LYMPHS PCT: 70 %
MCH: 27.5 pg (ref 25.1–34.0)
MCHC: 30.6 g/dL — ABNORMAL LOW (ref 31.5–36.0)
MCV: 89.9 fL (ref 79.5–101.0)
Monocytes Absolute: 0.4 10*3/uL (ref 0.1–0.9)
Monocytes Relative: 13 %
NEUTROS ABS: 0.5 10*3/uL — AB (ref 1.5–6.5)
Neutrophils Relative %: 16 %
Platelet Count: 67 10*3/uL — ABNORMAL LOW (ref 145–400)
RBC: 3.35 MIL/uL — AB (ref 3.70–5.45)
RDW: 18.5 % — ABNORMAL HIGH (ref 11.2–14.5)
WBC: 2.9 10*3/uL — AB (ref 3.9–10.3)

## 2017-11-30 LAB — CMP (CANCER CENTER ONLY)
ALBUMIN: 3.6 g/dL (ref 3.5–5.0)
ALT: 9 U/L (ref 0–55)
ANION GAP: 9 (ref 3–11)
AST: 14 U/L (ref 5–34)
Alkaline Phosphatase: 54 U/L (ref 40–150)
BILIRUBIN TOTAL: 2.4 mg/dL — AB (ref 0.2–1.2)
BUN: 16 mg/dL (ref 7–26)
CHLORIDE: 106 mmol/L (ref 98–109)
CO2: 25 mmol/L (ref 22–29)
Calcium: 9.2 mg/dL (ref 8.4–10.4)
Creatinine: 0.82 mg/dL (ref 0.60–1.10)
GFR, Est AFR Am: >60 mL/min (ref >60)
GLUCOSE: 90 mg/dL (ref 70–140)
POTASSIUM: 5.2 mmol/L — AB (ref 3.5–5.1)
Sodium: 140 mmol/L (ref 136–145)
TOTAL PROTEIN: 6.7 g/dL (ref 6.4–8.3)

## 2017-11-30 LAB — RETICULOCYTES
RBC.: 3.35 MIL/uL — ABNORMAL LOW (ref 3.70–5.45)
RETIC COUNT ABSOLUTE: 90.5 10*3/uL (ref 33.7–90.7)
Retic Ct Pct: 2.7 % — ABNORMAL HIGH (ref 0.7–2.1)

## 2017-11-30 LAB — LACTATE DEHYDROGENASE: LDH: 226 U/L (ref 125–245)

## 2017-12-17 MED FILL — FOLIC ACID 1 MG TABLET: 1 | 30 days supply | Qty: 30 | Fill #2

## 2017-12-21 ENCOUNTER — Ambulatory Visit (HOSPITAL_COMMUNITY)
Admission: RE | Admit: 2017-12-21 | Discharge: 2017-12-21 | Disposition: A | Discharge status: Home / Self Care | Payer: Self-pay | Source: Ambulatory Visit | Attending: Hematology | Admitting: Hematology

## 2017-12-21 ENCOUNTER — Inpatient Hospital Stay: Payer: Self-pay

## 2017-12-21 DIAGNOSIS — R161 Splenomegaly, not elsewhere classified: Secondary | ICD-10-CM | POA: Insufficient documentation

## 2017-12-21 DIAGNOSIS — I700 Atherosclerosis of aorta: Secondary | ICD-10-CM | POA: Insufficient documentation

## 2017-12-21 DIAGNOSIS — C8307 Small cell B-cell lymphoma, spleen: Secondary | ICD-10-CM

## 2017-12-21 LAB — CMP (CANCER CENTER ONLY)
ALT: 9 U/L (ref 0–55)
ANION GAP: 9 (ref 3–11)
AST: 13 U/L (ref 5–34)
Albumin: 3.6 g/dL (ref 3.5–5.0)
Alkaline Phosphatase: 53 U/L (ref 40–150)
BILIRUBIN TOTAL: 2.2 mg/dL — AB (ref 0.2–1.2)
BUN: 21 mg/dL (ref 7–26)
CHLORIDE: 106 mmol/L (ref 98–109)
CO2: 25 mmol/L (ref 22–29)
Calcium: 9.2 mg/dL (ref 8.4–10.4)
Creatinine: 0.89 mg/dL (ref 0.60–1.10)
GFR, Est AFR Am: >60 mL/min (ref >60)
Glucose, Bld: 105 mg/dL (ref 70–140)
Potassium: 4.7 mmol/L (ref 3.5–5.1)
Sodium: 140 mmol/L (ref 136–145)
TOTAL PROTEIN: 6.4 g/dL (ref 6.4–8.3)

## 2017-12-21 LAB — CBC WITH DIFFERENTIAL (CANCER CENTER ONLY)
BASOS PCT: 0 %
Basophils Absolute: 0 10*3/uL (ref 0.0–0.1)
Eosinophils Absolute: 0 10*3/uL (ref 0.0–0.5)
Eosinophils Relative: 1 %
HEMATOCRIT: 28.8 % — AB (ref 34.8–46.6)
HEMOGLOBIN: 10 g/dL — AB (ref 11.6–15.9)
LYMPHS PCT: 68 %
Lymphs Abs: 3.4 10*3/uL — ABNORMAL HIGH (ref 0.9–3.3)
MCH: 29.3 pg (ref 25.1–34.0)
MCHC: 34.8 g/dL (ref 31.5–36.0)
MCV: 84.1 fL (ref 79.5–101.0)
Monocytes Absolute: 0.2 10*3/uL (ref 0.1–0.9)
Monocytes Relative: 5 %
NEUTROS ABS: 1.3 10*3/uL — AB (ref 1.5–6.5)
NEUTROS PCT: 26 %
Platelet Count: 65 10*3/uL — ABNORMAL LOW (ref 145–400)
RBC: 3.42 MIL/uL — AB (ref 3.70–5.45)
RDW: 18.3 % — AB (ref 11.2–14.5)
WBC: 5 10*3/uL (ref 3.9–10.3)

## 2017-12-21 LAB — RETICULOCYTES
RBC.: 3.38 MIL/uL — AB (ref 3.87–5.11)
RETIC COUNT ABSOLUTE: 74.4 10*3/uL (ref 19.0–186.0)
Retic Ct Pct: 2.2 % (ref 0.4–3.1)

## 2017-12-21 LAB — LACTATE DEHYDROGENASE: LDH: 229 U/L (ref 125–245)

## 2017-12-21 MED ORDER — IOPAMIDOL (ISOVUE-300) INJECTION 61%
INTRAVENOUS | Status: AC
  Filled 2017-12-21: qty 100

## 2017-12-21 MED ORDER — IOPAMIDOL (ISOVUE-300) INJECTION 61%
100.0000 mL | Freq: Once | INTRAVENOUS | Status: AC | PRN
  Administered 2017-12-21: 100 mL via INTRAVENOUS

## 2017-12-27 NOTE — Progress Notes (Signed)
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 female who has been referred to [place] from [place] [facility] for evaluation and management of B-cell lymphoma. She presents to her appointment today with her partner of 21 years. She states that she moved to the area ~2 years ago secondary to her sister's diagnosis of lung cancer (unfortunately, now passed) and taking care of her mother's Alzheimer's. Generally, prior to this issue she has been very healthy and without chronic medical conditions and not on chronic medical therapies/treatments.   Initially, the patient was admitted to [facility] for abdominal pain and significant hernia. She reports that she was visiting in [place] and while flying there she had an acute onset of sweats and left lower quadrant pain. She reports that her hernia was not present at that time, but as she continued to fly her hernia had become more noticeable and enlarged in her lower abdomen. Additionally, her partner reports that her episode of hydrosis was very significant, stating that it was pooling below her. On their arrival to [facility] on 06/21/17, a CT A/P was performed which showed an incarcerated hernia. Of note, her CT showed massive splenomegaly at 32cm in greatest dimension. Manual reduction of her hernia was attempted while in the ED but was unsuccessful. Pre-operative notes state that following intubation manual reduction was performed successfully and hernia was repaired laparoscopically. CT CAP was performed as well which was without the presence of additional surrounding LAD. She did have flow cytometry performed during her admission as well which showed B-cell lymphocytes which were monoclonal. A bone  marrow biopsy was also examined which showed that it was markedly hypercellular (~95%) with prominent interstitial infiltrate of small lymphocytes.   Prior to her diagnosis, she mostly felt typically well overall. She states that she did suffer from insomnia which she related to over-thinking at the time regarding her familial situation. She did experience some fatigue and weight loss with this as well. Her partner reports that she noticed her weight loss approximately 37moago, which she quantifies at approximately 30lbs since onset. Towards the end of this six month periods and just prior to the appearance of her hernia the patient began to notice some bothersome abdominal distension. She also noticed some significant fatigue and she would nap for longer periods following her daily workouts. Additionally, her partner would notice that she would have issues with breathing and this appeared irregular and more labored, especially at night while laying down. She also mentions that she did experience intermittent night sweats throughout this six month period as well. She denies lightheadedness, dizziness, abdominal pain, back pain, fever, chills, abnormal bruising/bleeding, bloody stools, epistaxis, hematuria, gingival bleeding, or other bleeding issues within this period. She has not received any blood transfusions and has not previously had a tattoo; she is unconcerned with prior HepC exposure.   Since her hernia surgery, she reports that she feels great symptomatically overall and much improved. She does have some issues with abdominal bloating and discomfort, but this has been manageable. No overt abdominal pain.   Throughout all of this, she has attempted to try to remain within good spirits. Losing her sister recently has been hard, but she has been working through it with her  family; however, her partner voices that they do not have strong social support in this area as most of their support is in [place]  where they previously lived. She has been working through her sister's death with grief counseling which has been helping her.    She has never previously smoked, but she reports some moderate second-hand smoke through her father at a younger age. Her father did die of a PE, but she is unaware of any other diagnoses of clotting disorders or cancers within her family. No h/o alcohol abuse, chemical/radiation exposures. She worked previously in Industrial/product designer.   PREVIOUS TREATMENT:  Rituximab weekly x4 beginning on 07/21/17-08/11/17 Received additional Rituxan weekly x 4 doses Rituximab  Weekly for 4 weeks starting 10/05/17 - completed 10/2017.  CURRENT TREATMENT:  Planning for splenectomy   INTERIM HISTORY:   Michelle Cohen returns today for scheduled follow up of her SMZL. The patient's last visit with [place] was on 11/09/17. She is accompanied today by her partner. The pt reports that she is doing well overall.   The pt reports that she has been eating well and has gained a few pounds. She notes that she has some gas pain near the area of her weakened hernia. She notes that she has been moving her bowels well and denies nausea and vomiting. She notes that her hernia had "floated upward" a few days ago, but after a few hours it resolved. She also notes that she tried taking Ativan but has determined that Ativan (and notably Benadryl also) have the opposite effect of the intended calming effect, for her.   She notes that she has finished her pre-splenectomy vaccines. She notes that she has been considering who she would like a referral to at [facility] for a possible impending splenectomy; pending her decision to elect this surgery. After much clinical conversation, the pt noted that she would like to receive a referral to [facility] GI surgeon Dr. Raelyn Ensign. Allen.   Of note since the patient's last visit, pt has had CT Abdomen completed on 12/21/17 with results revealing Massive splenomegaly  redemonstrated, as above. No lymphadenopathy is noted in the abdomen or pelvis to suggest additional sites of disease. 2. Aortic atherosclerosis. The pt also had a blood transfusion on 12/05/17 since our last visit.   Lab results (12/21/17) of CBC, CMP, and Reticulocytes is as follows: all values are WNL except for RBC at 3.42, Hgb at 10.0, HCT at 28.8, RDW at 18.3, Platelets at 65k, Neutro Abs at 1.3k, Lymphs Abs at 3.4k, Total Bilirubin at 2.2. LDH 12/21/17 is WNL at 229.  On review of systems, pt reports better night sweats, intermittent abdominal discomfort, and denies fevers, chills, mouth sores, and any other symptoms.    MEDICAL HISTORY:  Past Medical History:  Diagnosis Date  . B-cell lymphoma ([place]) 06/2017    SURGICAL HISTORY: Past Surgical History:  Procedure Laterality Date  . left inguinal hernia repair Left     SOCIAL HISTORY: Social History   Socioeconomic History  . Marital status: Single    Spouse name: Not on file  . Number of children: Not on file  . Years of education: Not on file  . Highest education level: Not on file  Occupational History  . Not on file  Social Needs  . Financial resource strain: Not on file  . Food insecurity:    Worry: Not on file    Inability: Not on file  . Transportation needs:  Medical: Not on file    Non-medical: Not on file  Tobacco Use  . Smoking status: Never Smoker  . Smokeless tobacco: Never Used  Substance and Sexual Activity  . Alcohol use: Yes    Comment: glass of wine occasionally  . Drug use: No  . Sexual activity: Not on file  Lifestyle  . Physical activity:    Days per week: Not on file    Minutes per session: Not on file  . Stress: Not on file  Relationships  . Social connections:    Talks on phone: Not on file    Gets together: Not on file    Attends religious service: Not on file    Active member of club or organization: Not on file    Attends meetings of clubs or organizations: Not on file     Relationship status: Not on file  . Intimate partner violence:    Fear of current or ex partner: Not on file    Emotionally abused: Not on file    Physically abused: Not on file    Forced sexual activity: Not on file  Other Topics Concern  . Not on file  Social History Narrative  . Not on file    FAMILY HISTORY: Sister - lung cancer Mother -Alzheimers dementia  ALLERGIES:  has No Known Allergies.  MEDICATIONS:  Current Outpatient Medications  Medication Sig Dispense Refill  . acetaminophen (TYLENOL) 500 MG tablet Take 500 mg by mouth every 8 (eight) hours as needed for moderate pain.     Marland Kitchen acyclovir (ZOVIRAX) 200 MG capsule Take 2 capsules (400 mg total) by mouth 2 (two) times daily. 60 capsule 1  . docusate sodium (COLACE) 100 MG capsule Take 100 mg by mouth 2 (two) times daily.    . folic acid (FOLVITE) 1 MG tablet Take 1 tablet (1 mg total) by mouth daily. 30 tablet 2  . LORazepam (ATIVAN) 0.5 MG tablet Take 1 tablet (0.5 mg total) by mouth every 8 (eight) hours as needed for anxiety. 30 tablet 0  . magic mouthwash w/lidocaine SOLN Take 5 mLs by mouth 4 (four) times daily as needed for mouth pain. Swish and spit 120 mL 0  . senna (SENOKOT) 8.6 MG TABS tablet Take 1 tablet by mouth daily as needed for mild constipation.     Marland Kitchen allopurinol (ZYLOPRIM) 100 MG tablet Take 100 mg by mouth daily.   2   No current facility-administered medications for this visit.     REVIEW OF SYSTEMS:    10 Point review of Systems was done is negative except as noted above.    PHYSICAL EXAMINATION:  ECOG PERFORMANCE STATUS: 1  GENERAL:alert, in no acute distress and comfortable SKIN: no acute rashes, no significant lesions EYES: conjunctiva are pink and non-injected, sclera anicteric OROPHARYNX: MMM, no exudates, no oropharyngeal erythema or ulceration NECK: supple, no JVD LYMPH:  no palpable lymphadenopathy in the cervical, axillary or inguinal regions LUNGS: clear to auscultation b/l with  normal respiratory effort HEART: regular rate & rhythm ABDOMEN:  normoactive bowel sounds, distended, massive splenomegaly Extremity: no pedal edema PSYCH: alert & oriented x 3 with fluent speech NEURO: no focal motor/sensory deficits     LABORATORY DATA:  I have reviewed the data as listed   CBC Latest Ref Rng & Units 12/21/2017 11/30/2017 11/09/2017  WBC 3.9 - 10.3 K/uL 5.0 2.9(L) 3.8(L)  Hemoglobin 12.0 - 15.0 g/dL - - -  Hematocrit 34.8 - 46.6 % 28.8(L) 30.1(L) 24.3(L)  Platelets 145 - 400 K/uL 65(L) 67(L) 57(L)  HGB 10 ANC 1.3k . CBC    Component Value Date/Time   WBC 5.0 12/21/2017 0745   WBC 4.0 09/15/2017 1704   RBC 3.42 (L) 12/21/2017 0745   RBC 3.38 (L) 12/21/2017 0745   HGB 9.3 (L) 09/15/2017 1704   HGB 9.1 (L) 09/14/2017 0835   HCT 28.8 (L) 12/21/2017 0745   HCT 29.0 (L) 09/14/2017 0835   PLT 65 (L) 12/21/2017 0745   PLT 46 (L) 09/14/2017 0835   MCV 84.1 12/21/2017 0745   MCV 92.1 09/14/2017 0835   MCH 29.3 12/21/2017 0745   MCHC 34.8 12/21/2017 0745   RDW 18.3 (H) 12/21/2017 0745   RDW 18.2 (H) 09/14/2017 0835   LYMPHSABS 3.4 (H) 12/21/2017 0745   LYMPHSABS 2.0 09/14/2017 0835   MONOABS 0.2 12/21/2017 0745   MONOABS 0.3 09/14/2017 0835   EOSABS 0.0 12/21/2017 0745   EOSABS 0.0 09/14/2017 0835   BASOSABS 0.0 12/21/2017 0745   BASOSABS 0.0 09/14/2017 0835    . CMP Latest Ref Rng & Units 12/21/2017 11/30/2017 11/09/2017  Glucose 70 - 140 mg/dL 105 90 106  BUN 7 - 26 mg/dL _0 Creatinine 0.60 - 1.10 mg/dL 0.89 0.82 0.87  Sodium 136 - 145 mmol/L 140 140 137  Potassium 3.5 - 5.1 mmol/L 4.7 5.2(H) 4.2  Chloride 98 - 109 mmol/L 106 106 104  CO2 22 - 29 mmol/L _1 Calcium 8.4 - 10.4 mg/dL 9.2 9.2 8.6  Total Protein 6.4 - 8.3 g/dL 6.4 6.7 6.8  Total Bilirubin 0.2 - 1.2 mg/dL 2.2(H) 2.4(H) 2.2(H)  Alkaline Phos 40 - 150 U/L 53 54 61  AST 5 - 34 U/L _2 ALT 0 - 55 U/L 9 9 <6   Component     Latest Ref Rng & Units 07/13/2017  LDH     125  - 245 U/L 232  Hep C Virus Ab     0.0 - 0.9 s/co ratio <0.1  HIV Screen 4th Generation wRfx     Non Reactive Non Reactive  Hep B Core Ab, Tot     Negative Negative  Hepatitis B Surface Ag     Negative Negative   Component     Latest Ref Rng & Units 07/28/2017  LDH     125 - 245 U/L 200  Uric Acid, Serum     2.6 - 7.4 mg/dl 6.0  Phosphorus     2.5 - 4.5 mg/dL 3.8      RADIOGRAPHIC STUDIES: I have personally reviewed the radiological images as listed and agreed with the findings in the report. Ct Abdomen Pelvis W Contrast  Result Date: 12/21/2017 CLINICAL DATA:  67 year old female with history of lymphoma and severe splenomegaly. Chemotherapy ongoing. 25 pound weight loss. EXAM: CT ABDOMEN AND PELVIS WITH CONTRAST TECHNIQUE: Multidetector CT imaging of the abdomen and pelvis was performed using the standard protocol following bolus administration of intravenous contrast. CONTRAST:  164m ISOVUE-300 IOPAMIDOL (ISOVUE-300) INJECTION 61% COMPARISON:  CT the abdomen and pelvis 09/15/2017. FINDINGS: Lower chest: Areas of mild scarring throughout the visualize lung bases. Hepatobiliary: No suspicious cystic or solid hepatic lesions. No intra or extrahepatic biliary ductal dilatation. Gallbladder is normal in appearance. Pancreas: No pancreatic mass. No pancreatic ductal dilatation. No pancreatic or peripancreatic fluid or inflammatory changes. Spleen: Spleen is massively enlarged measuring 32.3 x 13.2 x 24.9 cm (estimated splenic volume of 5,308 mL), extending all the way to  the anatomic pelvis. Medially, the spleen is exerting significant mass effect upon the left kidney which appears partially compressed against the spine, similar to the prior study from 09/15/2017. Adrenals/Urinary Tract: Other than compression of the left kidney by the enlarged spleen, bilateral kidneys and adrenal glands are normal in appearance. No hydroureteronephrosis. Urinary bladder is nearly completely decompressed, but  otherwise unremarkable in appearance. Stomach/Bowel: Stomach is partially compressed by the spleen, but is otherwise unremarkable in appearance. No pathologic dilatation of small bowel or colon. As was evident on prior examinations, small bowel and colon are displaced by the patient's massively enlarged spleen. Normal appendix. Vascular/Lymphatic: Aortic atherosclerosis, without evidence of aneurysm or dissection in the abdominal or pelvic vasculature. No lymphadenopathy noted in the abdomen or pelvis. Reproductive: Uterus and ovaries are atrophic. Other: No significant volume of ascites.  No pneumoperitoneum. Musculoskeletal: There are no aggressive appearing lytic or blastic lesions noted in the visualized portions of the skeleton. IMPRESSION: 1. Massive splenomegaly redemonstrated, as above. No lymphadenopathy is noted in the abdomen or pelvis to suggest additional sites of disease. 2. Aortic atherosclerosis. Aortic Atherosclerosis (ICD10-I70.0). Electronically Signed   By: Vinnie Langton M.D.   On: 12/21/2017 14:15   ASSESSMENT & PLAN:   Michelle Cohen is a wonderful 67 y.o. caucasian female who presents to our clinic to discuss ongoing management of the following:   1.  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  Her clinical presentation is also representative of this and that this may have been present over months to possibly years.  -With her pattern of involvement and results thus far it is likely that she has splenic marginal zone type lymphoma.  -PET scan on 07/25/2017 with results showing: Massive splenomegaly, splenic volume 5770 cubic cm, with homogeneous low grade activity throughout the spleen equal to  that of the physiologic activity in the liver, and above the background blood pool activity in the mediastinum. No pathologic adenopathy identified. No bony involvement noted.   -CT A/p on 08/23/17 with results of: Marked splenomegaly without substantial interval change in splenic size/volume. Borderline lymphadenopathy in the hepatoduodenal ligament. Aortic Atherosclerosis. I discussed this with the patient in great detail.  -CT A/p on 09/15/17 shows significant splenomegaly shows minimal smaller change in size, with no evidence of bowel obstruction.   Plan -Discussed pt labwork today; anemia is better at last lab with Hgb increased to 10.0, blood chemistries are stable, lymphs slightly higher at 3.4k, platelets are stable at 65k.  -Reviewed CT abdomen with pt and her partner and noted that her massive splenomegaly has not resolved since the previous CT and with extended treatment with Rituxan -Most recent CBC is not prohibitive for possible splenectomy. --We will refer pt to GI surgeon Dr. Raelyn Ensign. Allen at [facility] for splenectomy evaluation per patient request. -she has completed planned pre-splenectomy vaccines. -Will switch pt to Hydroxyzine from Ativan given that she noted that Ativan does not produce the desired effect to address her insomnia -Will see pt back in 6 weeks with labs  2. Pancytopenia with leukocytosis and anemia  Due to SMZL and hypersplenism -If Hgb falls below <7 we will consider blood transfusion.  Plan -neutropenia ANC overall stable at 1.3 today -- I have recommended infection  precautions. -cannot safely use neulasta due to splenic size and risk for splenic rupture. Will need appropriate antibiotic prophylaxis if she chooses to proceed with splenectomy - completed pre-splenectomy vaccinations.  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 Had to complete  Rituxan at lower rate. Patient would not be a candidate for rapid infusion protocol and would need to keep the next infusion at the tolerated rate and not rate escalate. Plan  Completed with extensive pre-medications.  4. Oral mucositis/HSV outbreak -continue with acyclovir for prophylaxis -this has been improving throughout treatment and was resolved.  -She has another episode of mucositis, I have refilled her magic mouthwash and continue acyclovir.  -Refilling Acyclovir today   RTC with Dr Irene Limbo in 6 weeks with labs Will give referral to [facility] to Dr Lula Olszewski for consideration of splenectomy    . The total time spent in the appointment was 25 minutes and more than 50% was on counseling and direct patient cares.    Sullivan Lone MD [place] AAHIVMS [facility] Hematology/Oncology Physician [facility]  (Office):       [phone] (Work cell):  [phone] (Fax):           [phone]  This document serves as a record of services personally performed by Sullivan Lone, MD. It was created on his behalf by Baldwin Jamaica, a trained medical scribe. The creation of this record is based on the scribe's personal observations and the provider's statements to them.   .I have reviewed the above documentation for accuracy and completeness, and I agree with the above. Brunetta Genera MD MS

## 2017-12-28 ENCOUNTER — Inpatient Hospital Stay: Payer: Self-pay | Attending: Hematology | Admitting: Hematology

## 2017-12-28 ENCOUNTER — Encounter: Payer: Self-pay | Admitting: Hematology

## 2017-12-28 ENCOUNTER — Telehealth: Payer: Self-pay | Admitting: Hematology

## 2017-12-28 VITALS — BP 119/69 | HR 80 | Temp 98.8°F | Resp 20 | Ht 66.0 in | Wt 150.1 lb

## 2017-12-28 DIAGNOSIS — D61818 Other pancytopenia: Secondary | ICD-10-CM | POA: Insufficient documentation

## 2017-12-28 DIAGNOSIS — G4700 Insomnia, unspecified: Secondary | ICD-10-CM | POA: Insufficient documentation

## 2017-12-28 DIAGNOSIS — Z79899 Other long term (current) drug therapy: Secondary | ICD-10-CM | POA: Insufficient documentation

## 2017-12-28 DIAGNOSIS — D731 Hypersplenism: Secondary | ICD-10-CM | POA: Insufficient documentation

## 2017-12-28 DIAGNOSIS — I700 Atherosclerosis of aorta: Secondary | ICD-10-CM | POA: Insufficient documentation

## 2017-12-28 DIAGNOSIS — Z801 Family history of malignant neoplasm of trachea, bronchus and lung: Secondary | ICD-10-CM | POA: Insufficient documentation

## 2017-12-28 DIAGNOSIS — R1032 Left lower quadrant pain: Secondary | ICD-10-CM | POA: Insufficient documentation

## 2017-12-28 DIAGNOSIS — R634 Abnormal weight loss: Secondary | ICD-10-CM | POA: Insufficient documentation

## 2017-12-28 DIAGNOSIS — C8307 Small cell B-cell lymphoma, spleen: Secondary | ICD-10-CM | POA: Insufficient documentation

## 2017-12-28 DIAGNOSIS — R141 Gas pain: Secondary | ICD-10-CM | POA: Insufficient documentation

## 2017-12-28 MED ORDER — FOLIC ACID 1 MG PO TABS: 1.0000 mg | ORAL_TABLET | Freq: Every day | ORAL | 2 refills | Status: DC

## 2017-12-28 MED ORDER — ACYCLOVIR 200 MG PO CAPS: 400.0000 mg | ORAL_CAPSULE | Freq: Two times a day (BID) | ORAL | 1 refills | Status: DC

## 2017-12-28 MED ORDER — HYDROXYZINE HCL 25 MG PO TABS: 25.0000 mg | ORAL_TABLET | Freq: Three times a day (TID) | ORAL | 0 refills | Status: DC | PRN

## 2017-12-28 MED FILL — HYDROXYZINE PAM 25 MG CAP: 25 | 4 days supply | Qty: 14 | Fill #0

## 2017-12-28 MED FILL — ACYCLOVIR 200 MG CAP: 200 | 15 days supply | Qty: 60 | Fill #0

## 2017-12-28 NOTE — Telephone Encounter (Signed)
Appointments scheduled AVS/Calendar printed per 4/4 los °

## 2017-12-29 ENCOUNTER — Telehealth: Payer: Self-pay

## 2017-12-29 NOTE — Telephone Encounter (Signed)
Request called in to [facility], Radiology to have pt's most recent CT and PET placed on a disc. Pt to be called for p/u in Radiology.

## 2017-12-29 NOTE — Telephone Encounter (Signed)
Called [facility] at [phone]. Requesting referral for pt for consideration of splenectomy. Information requested to be faxed: patient medical records, reason for referral, demographics, and insurance. Confirmed with pt that she does not currently have any insurance. Paperwork faxed to [phone] with attn: Dr. Raelyn Ensign. Allen. Confirmed fax receipt 12/29/17 at 1007.  Pt aware of referral and that she will be notified of availability of disc for p/u at Radiology.

## 2018-01-12 ENCOUNTER — Telehealth: Payer: Self-pay | Admitting: *Deleted

## 2018-01-12 NOTE — Telephone Encounter (Signed)
Returned patient's phone call regarding referral to [facility]. Informed her that all medical records have been faxed to [facility]. She stated,"I called today and the problem is that I don't have insurance. They got me in touch with a financial counselor." She verbalized understanding.

## 2018-01-15 MED FILL — FOLIC ACID 1 MG TABLET: 1 | 30 days supply | Qty: 30 | Fill #0

## 2018-01-18 ENCOUNTER — Telehealth: Payer: Self-pay | Admitting: Hematology

## 2018-01-18 ENCOUNTER — Telehealth: Payer: Self-pay | Admitting: *Deleted

## 2018-01-18 NOTE — Telephone Encounter (Signed)
Spoke with Ann @ [facility] regarding referral for Dr. Raelyn Ensign. Allen. (Ann's phone number is [phone]). Patient had called earlier and spoke with Lelon Frohlich regarding spleen surgery. I faxed office notes, labs, imaging studies to  [phone], attention Ann.

## 2018-01-18 NOTE — Telephone Encounter (Signed)
Faxed records to [facility] °

## 2018-02-02 ENCOUNTER — Telehealth: Payer: Self-pay | Admitting: *Deleted

## 2018-02-02 NOTE — Telephone Encounter (Signed)
Pt called and requested recent fax notes, dates of rituxan, and immunizations be sent to Dr. Zenia Resides at [facility].  Requested information faxed to [phone].

## 2018-02-05 NOTE — Progress Notes (Signed)
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 female who has been referred to [place] from [place] for evaluation and management of B-cell lymphoma. She presents to her appointment today with her partner of 21 years. She states that she moved to the area ~2 years ago secondary to her sister's diagnosis of lung cancer (unfortunately, now passed) and taking care of her mother's Alzheimer's. Generally, prior to this issue she has been very healthy and without chronic medical conditions and not on chronic medical therapies/treatments.   Initially, the patient was admitted to [facility] for abdominal pain and significant hernia. She reports that she was visiting in [place] and while flying there she had an acute onset of sweats and left lower quadrant pain. She reports that her hernia was not present at that time, but as she continued to fly her hernia had become more noticeable and enlarged in her lower abdomen. Additionally, her partner reports that her episode of hydrosis was very significant, stating that it was pooling below her. On their arrival to [facility] on 06/21/17, a CT A/P was performed which showed an incarcerated hernia. Of note, her CT showed massive splenomegaly at 32cm in greatest dimension. Manual reduction of her hernia was attempted while in the ED but was unsuccessful. Pre-operative notes state that following intubation manual reduction was performed successfully and hernia was repaired laparoscopically. CT CAP was performed as well which was without the presence of additional surrounding LAD. She did have flow cytometry performed during her admission as well which showed B-cell lymphocytes which were monoclonal. A bone  marrow biopsy was also examined which showed that it was markedly hypercellular (~95%) with prominent interstitial infiltrate of small lymphocytes.   Prior to her diagnosis, she mostly felt typically well overall. She states that she did suffer from insomnia which she related to over-thinking at the time regarding her familial situation. She did experience some fatigue and weight loss with this as well. Her partner reports that she noticed her weight loss approximately 53moago, which she quantifies at approximately 30lbs since onset. Towards the end of this six month periods and just prior to the appearance of her hernia the patient began to notice some bothersome abdominal distension. She also noticed some significant fatigue and she would nap for longer periods following her daily workouts. Additionally, her partner would notice that she would have issues with breathing and this appeared irregular and more labored, especially at night while laying down. She also mentions that she did experience intermittent night sweats throughout this six month period as well. She denies lightheadedness, dizziness, abdominal pain, back pain, fever, chills, abnormal bruising/bleeding, bloody stools, epistaxis, hematuria, gingival bleeding, or other bleeding issues within this period. She has not received any blood transfusions and has not previously had a tattoo; she is unconcerned with prior HepC exposure.   Since her hernia surgery, she reports that she feels great symptomatically overall and much improved. She does have some issues with abdominal bloating and discomfort, but this has been manageable. No overt abdominal pain.   Throughout all of this, she has attempted to try to remain within good spirits. Losing her sister recently has been hard, but she has been working through it with her  family; however, her partner voices that they do not have strong social support in this area as most of their support is in [place]  where they previously lived. She has been working through her sister's death with grief counseling which has been helping her.    She has never previously smoked, but she reports some moderate second-hand smoke through her father at a younger age. Her father did die of a PE, but she is unaware of any other diagnoses of clotting disorders or cancers within her family. No h/o alcohol abuse, chemical/radiation exposures. She worked previously in Industrial/product designer.   PREVIOUS TREATMENT:  Rituximab weekly x4 beginning on 07/21/17-08/11/17 Received additional Rituxan weekly x 4 doses Rituximab  Weekly for 4 weeks starting 10/05/17 - completed 10/2017.  CURRENT TREATMENT:  Planning for splenectomy at [facility] with Dr Shon Hough.   INTERIM HISTORY:   [name] returns today for scheduled follow up of her SMZL. The patient's last visit with [place] was on 12/28/17. She is accompanied today by her partner. The pt reports that she is doing well overall. She is awaiting a splenectomy with Dr. Shon Hough at [facility] on 02/13/18.   The pt reports that she has already completed her pre-anaesthesia evaluation and has much confidence in her impending surgery. She notes no new concerns at this time.   Lab results today (02/07/18) of CBC, CMP, and Reticulocytes is as follows: all values are WNL except for RBC at 3.00, Hgb at 8.0, HCT at 25.7, MCHC at 31.1, RDW at 17.8, Platelets at 50k, ANC at 1.0k, Total Bilirubin at 2.6.  On review of systems, pt reports more fatigue, and denies any other symptoms.   MEDICAL HISTORY:  Past Medical History:  Diagnosis Date  . B-cell lymphoma ([place]) 06/2017    SURGICAL HISTORY: Past Surgical History:  Procedure Laterality Date  . left inguinal hernia repair Left     SOCIAL HISTORY: Social History   Socioeconomic History  . Marital status: Single    Spouse name: Not on file  . Number of children: Not on file  . Years of education: Not on file  . Highest  education level: Not on file  Occupational History  . Not on file  Social Needs  . Financial resource strain: Not on file  . Food insecurity:    Worry: Not on file    Inability: Not on file  . Transportation needs:    Medical: Not on file    Non-medical: Not on file  Tobacco Use  . Smoking status: Never Smoker  . Smokeless tobacco: Never Used  Substance and Sexual Activity  . Alcohol use: Yes    Comment: glass of wine occasionally  . Drug use: No  . Sexual activity: Not on file  Lifestyle  . Physical activity:    Days per week: Not on file    Minutes per session: Not on file  . Stress: Not on file  Relationships  . Social connections:    Talks on phone: Not on file    Gets together: Not on file    Attends religious service: Not on file    Active member of club or organization: Not on file    Attends meetings of clubs or organizations: Not on file    Relationship status: Not on file  . Intimate partner violence:    Fear of current or ex partner: Not on file    Emotionally abused: Not on file    Physically abused:  Not on file    Forced sexual activity: Not on file  Other Topics Concern  . Not on file  Social History Narrative  . Not on file    FAMILY HISTORY: Sister - lung cancer Mother -Alzheimers dementia  ALLERGIES:  has No Known Allergies.  MEDICATIONS:  Current Outpatient Medications  Medication Sig Dispense Refill  . acetaminophen (TYLENOL) 500 MG tablet Take 500 mg by mouth every 8 (eight) hours as needed for moderate pain.     Marland Kitchen acyclovir (ZOVIRAX) 200 MG capsule Take 2 capsules (400 mg total) by mouth 2 (two) times daily. 60 capsule 1  . docusate sodium (COLACE) 100 MG capsule Take 100 mg by mouth 2 (two) times daily.    . folic acid (FOLVITE) 1 MG tablet Take 1 tablet (1 mg total) by mouth daily. 30 tablet 2  . hydrOXYzine (ATARAX/VISTARIL) 25 MG tablet Take 1 tablet (25 mg total) by mouth every 8 (eight) hours as needed for anxiety, nausea or vomiting.  30 tablet 0  . Loratadine 10 MG CAPS Take 10 mg by mouth daily.    . magic mouthwash w/lidocaine SOLN Take 5 mLs by mouth 4 (four) times daily as needed for mouth pain. Swish and spit 120 mL 0  . senna (SENOKOT) 8.6 MG TABS tablet Take 1 tablet by mouth daily as needed for mild constipation.      No current facility-administered medications for this visit.     REVIEW OF SYSTEMS:   A 10+ POINT REVIEW OF SYSTEMS WAS OBTAINED including neurology, dermatology, psychiatry, cardiac, respiratory, lymph, extremities, GI, GU, Musculoskeletal, constitutional, breasts, reproductive, HEENT.  All pertinent positives are noted in the HPI.  All others are negative.    PHYSICAL EXAMINATION:  ECOG PERFORMANCE STATUS: 1  GENERAL:alert, in no acute distress and comfortable SKIN: no acute rashes, no significant lesions EYES: conjunctiva are pink and non-injected, sclera anicteric OROPHARYNX: MMM, no exudates, no oropharyngeal erythema or ulceration NECK: supple, no JVD LYMPH:  no palpable lymphadenopathy in the cervical, axillary or inguinal regions LUNGS: clear to auscultation b/l with normal respiratory effort HEART: regular rate & rhythm ABDOMEN:  normoactive bowel sounds , non tender, massive splenomegaly Extremity: no pedal edema PSYCH: alert & oriented x 3 with fluent speech NEURO: no focal motor/sensory deficits      LABORATORY DATA:  I have reviewed the data as listed   CBC Latest Ref Rng & Units 02/07/2018 12/21/2017 11/30/2017  WBC 3.9 - 10.3 K/uL 4.4 5.0 2.9(L)  Hemoglobin 11.6 - 15.9 g/dL 8.0(L) 10.0(L) 9.2(L)  Hematocrit 34.8 - 46.6 % 25.7(L) 28.8(L) 30.1(L)  Platelets 145 - 400 K/uL 50(L) 65(L) 67(L)  HGB 10 ANC 1k . CBC    Component Value Date/Time   WBC 4.4 02/07/2018 0905   WBC 4.0 09/15/2017 1704   RBC 3.00 (L) 02/07/2018 0905   RBC 3.00 (L) 02/07/2018 0905   HGB 8.0 (L) 02/07/2018 0905   HGB 9.1 (L) 09/14/2017 0835   HCT 25.7 (L) 02/07/2018 0905   HCT 29.0 (L)  09/14/2017 0835   PLT 50 (L) 02/07/2018 0905   PLT 46 (L) 09/14/2017 0835   MCV 85.7 02/07/2018 0905   MCV 92.1 09/14/2017 0835   MCH 26.7 02/07/2018 0905   MCHC 31.1 (L) 02/07/2018 0905   RDW 17.8 (H) 02/07/2018 0905   RDW 18.2 (H) 09/14/2017 0835   LYMPHSABS 2.8 02/07/2018 0905   LYMPHSABS 2.0 09/14/2017 0835   MONOABS 0.5 02/07/2018 0905   MONOABS 0.3 09/14/2017 0867  EOSABS 0.0 02/07/2018 0905   EOSABS 0.0 09/14/2017 0835   BASOSABS 0.0 02/07/2018 0905   BASOSABS 0.0 09/14/2017 0835    . CMP Latest Ref Rng & Units 02/07/2018 12/21/2017 11/30/2017  Glucose 70 - 140 mg/dL 104 105 90  BUN 7 - 26 mg/dL _0 Creatinine 0.60 - 1.10 mg/dL 0.95 0.89 0.82  Sodium 136 - 145 mmol/L 139 140 140  Potassium 3.5 - 5.1 mmol/L 4.1 4.7 5.2(H)  Chloride 98 - 109 mmol/L 106 106 106  CO2 22 - 29 mmol/L _1 Calcium 8.4 - 10.4 mg/dL 9.1 9.2 9.2  Total Protein 6.4 - 8.3 g/dL 7.0 6.4 6.7  Total Bilirubin 0.2 - 1.2 mg/dL 2.6(H) 2.2(H) 2.4(H)  Alkaline Phos 40 - 150 U/L 55 53 54  AST 5 - 34 U/L _2 ALT 0 - 55 U/L _3 Component     Latest Ref Rng & Units 07/13/2017  LDH     125 - 245 U/L 232  Hep C Virus Ab     0.0 - 0.9 s/co ratio <0.1  HIV Screen 4th Generation wRfx     Non Reactive Non Reactive  Hep B Core Ab, Tot     Negative Negative  Hepatitis B Surface Ag     Negative Negative   Component     Latest Ref Rng & Units 07/28/2017  LDH     125 - 245 U/L 200  Uric Acid, Serum     2.6 - 7.4 mg/dl 6.0  Phosphorus     2.5 - 4.5 mg/dL 3.8      RADIOGRAPHIC STUDIES: I have personally reviewed the radiological images as listed and agreed with the findings in the report. No results found.   ASSESSMENT & PLAN:   Jamala Kohen is a wonderful 67 y.o. caucasian female who presents to our clinic to discuss ongoing management of the following:   1.  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  Her clinical presentation is also representative of this and that this may have been present over months to possibly years.  -With her pattern of involvement and results thus far it is likely that she has splenic marginal zone type lymphoma.  -PET scan on 07/25/2017 with results showing: Massive splenomegaly, splenic volume 5770 cubic cm, with homogeneous low grade activity throughout the spleen equal to that of the physiologic activity in the liver, and above the background blood pool activity in the mediastinum. No pathologic adenopathy identified. No bony involvement noted.   -CT A/p on 08/23/17 with results of: Marked splenomegaly without substantial interval change in splenic size/volume. Borderline lymphadenopathy in the hepatoduodenal ligament. Aortic Atherosclerosis. I discussed this with the patient in great detail.  -CT A/p on 09/15/17 shows significant splenomegaly shows minimal smaller change in size, with no evidence of bowel obstruction.   Plan --Discussed pt labwork today, 02/07/18; Hgb at 8.0 and platelets are at 50k and chemistries are stable.  -Will see pt back in 2 weeks post splenectomy with Dr Shon Hough, which is on 02/13/18, to check blood counts post-operatively -Would be quite reasonable to transfuse PRBC and platelets pre-op and peri-operatively for splenectomy if surgeon deems necessary .  Transfusion at this time in advance will lead to significant splenic sequestration.   2. Pancytopenia with leukocytosis and anemia  Due to SMZL and hypersplenism -If Hgb falls below <7 we will consider blood transfusion.  Plan -neutropenia ANC overall stable at 1k today -- I have recommended infection precautions. -cannot safely use neulasta due to splenic size and risk for  splenic rupture. Will need appropriate antibiotic prophylaxis if she chooses to proceed with splenectomy  - completed pre-splenectomy vaccinations. -anticipate pancytopenia will resolve after splenectomy.  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 Had to complete Rituxan at lower rate. Patient would not be a candidate for rapid infusion protocol and would need to keep the next infusion at the tolerated rate and not rate escalate. Plan  Completed with extensive pre-medications.  4. Oral mucositis/HSV outbreak - resolved -continue with acyclovir for prophylaxis   RTC with Dr Irene Limbo in 3 weeks with labs   . The total time spent in the appointment was 20 minutes and more than 50% was on counseling and direct patient cares.   Sullivan Lone MD [place] AAHIVMS [facility] Hematology/Oncology Physician [facility]  (Office):       [phone] (Work cell):  [phone] (Fax):           [phone]  This document serves as a record of services personally performed by Sullivan Lone, MD. It was created on his behalf by Baldwin Jamaica, a trained medical scribe. The creation of this record is based on the scribe's personal observations and the provider's statements to them.   .I have reviewed the above documentation for accuracy and completeness, and I agree with the above. Brunetta Genera MD MS

## 2018-02-07 ENCOUNTER — Encounter: Payer: Self-pay | Admitting: Hematology

## 2018-02-07 ENCOUNTER — Inpatient Hospital Stay: Payer: Self-pay | Attending: Hematology

## 2018-02-07 ENCOUNTER — Telehealth: Payer: Self-pay | Admitting: Hematology

## 2018-02-07 ENCOUNTER — Inpatient Hospital Stay (HOSPITAL_BASED_OUTPATIENT_CLINIC_OR_DEPARTMENT_OTHER): Payer: Self-pay | Admitting: Hematology

## 2018-02-07 VITALS — BP 127/56 | HR 82 | Temp 98.2°F | Resp 18 | Ht 66.0 in | Wt 151.8 lb

## 2018-02-07 DIAGNOSIS — Z79899 Other long term (current) drug therapy: Secondary | ICD-10-CM

## 2018-02-07 DIAGNOSIS — Z7982 Long term (current) use of aspirin: Secondary | ICD-10-CM

## 2018-02-07 DIAGNOSIS — D61818 Other pancytopenia: Secondary | ICD-10-CM

## 2018-02-07 DIAGNOSIS — D696 Thrombocytopenia, unspecified: Secondary | ICD-10-CM

## 2018-02-07 DIAGNOSIS — D709 Neutropenia, unspecified: Secondary | ICD-10-CM

## 2018-02-07 DIAGNOSIS — C8307 Small cell B-cell lymphoma, spleen: Secondary | ICD-10-CM

## 2018-02-07 DIAGNOSIS — R5383 Other fatigue: Secondary | ICD-10-CM

## 2018-02-07 DIAGNOSIS — D649 Anemia, unspecified: Secondary | ICD-10-CM

## 2018-02-07 LAB — CMP (CANCER CENTER ONLY)
ALT: 11 U/L (ref 0–55)
AST: 18 U/L (ref 5–34)
Albumin: 3.6 g/dL (ref 3.5–5.0)
Alkaline Phosphatase: 55 U/L (ref 40–150)
Anion gap: 8 (ref 3–11)
BUN: 15 mg/dL (ref 7–26)
CHLORIDE: 106 mmol/L (ref 98–109)
CO2: 25 mmol/L (ref 22–29)
Calcium: 9.1 mg/dL (ref 8.4–10.4)
Creatinine: 0.95 mg/dL (ref 0.60–1.10)
GFR, Est AFR Am: >60 mL/min (ref >60)
Glucose, Bld: 104 mg/dL (ref 70–140)
POTASSIUM: 4.1 mmol/L (ref 3.5–5.1)
SODIUM: 139 mmol/L (ref 136–145)
Total Bilirubin: 2.6 mg/dL — ABNORMAL HIGH (ref 0.2–1.2)
Total Protein: 7 g/dL (ref 6.4–8.3)

## 2018-02-07 LAB — CBC WITH DIFFERENTIAL (CANCER CENTER ONLY)
Basophils Absolute: 0 10*3/uL (ref 0.0–0.1)
Basophils Relative: 0 %
Eosinophils Absolute: 0 10*3/uL (ref 0.0–0.5)
Eosinophils Relative: 1 %
HEMATOCRIT: 25.7 % — AB (ref 34.8–46.6)
HEMOGLOBIN: 8 g/dL — AB (ref 11.6–15.9)
Lymphocytes Relative: 64 %
Lymphs Abs: 2.8 10*3/uL (ref 0.9–3.3)
MCH: 26.7 pg (ref 25.1–34.0)
MCHC: 31.1 g/dL — ABNORMAL LOW (ref 31.5–36.0)
MCV: 85.7 fL (ref 79.5–101.0)
MONOS PCT: 12 %
Monocytes Absolute: 0.5 10*3/uL (ref 0.1–0.9)
NEUTROS ABS: 1 10*3/uL — AB (ref 1.5–6.5)
NEUTROS PCT: 23 %
Platelet Count: 50 10*3/uL — ABNORMAL LOW (ref 145–400)
RBC: 3 MIL/uL — AB (ref 3.70–5.45)
RDW: 17.8 % — ABNORMAL HIGH (ref 11.2–14.5)
Smear Review: 2
WBC: 4.4 10*3/uL (ref 3.9–10.3)

## 2018-02-07 LAB — RETICULOCYTES
RBC.: 3 MIL/uL — ABNORMAL LOW (ref 3.70–5.45)
RETIC COUNT ABSOLUTE: 63 10*3/uL (ref 33.7–90.7)
Retic Ct Pct: 2.1 % (ref 0.7–2.1)

## 2018-02-07 LAB — LACTATE DEHYDROGENASE: LDH: 302 U/L — AB (ref 125–245)

## 2018-02-07 MED FILL — HYDROXYZINE PAM 25 MG CAP: 25 | 4 days supply | Qty: 16 | Fill #1

## 2018-02-07 NOTE — Telephone Encounter (Signed)
Scheduled appt per 5/15 los - Gave pt AVS and calender per los.

## 2018-02-08 ENCOUNTER — Telehealth: Payer: Self-pay | Admitting: Hematology

## 2018-02-08 NOTE — Telephone Encounter (Signed)
Patient called in to reschedule 6/5 appt

## 2018-02-13 HISTORY — PX: SPLENECTOMY, TOTAL: SHX788

## 2018-02-17 MED ORDER — LIDOCAINE HCL 1 % IJ SOLN
.50 | INTRAMUSCULAR | Status: DC
Start: ? — End: 2018-02-17

## 2018-02-17 MED ORDER — ONDANSETRON HCL 4 MG/2ML IJ SOLN
4.00 | INTRAMUSCULAR | Status: DC
Start: ? — End: 2018-02-17

## 2018-02-17 MED ORDER — MAGNESIUM HYDROXIDE 400 MG/5ML PO SUSP
30.00 | ORAL | Status: DC
Start: 2018-02-18 — End: 2018-02-17

## 2018-02-17 MED ORDER — GENERIC EXTERNAL MEDICATION
2.50 | Status: DC
Start: ? — End: 2018-02-17

## 2018-02-17 MED ORDER — GABAPENTIN 100 MG PO CAPS
100.00 | ORAL_CAPSULE | ORAL | Status: DC
Start: 2018-02-17 — End: 2018-02-17

## 2018-02-17 MED ORDER — OXYCODONE HCL 5 MG PO TABS
5.00 | ORAL_TABLET | ORAL | Status: DC
Start: ? — End: 2018-02-17

## 2018-02-17 MED ORDER — GENERIC EXTERNAL MEDICATION
12.50 | Status: DC
Start: ? — End: 2018-02-17

## 2018-02-17 MED ORDER — ACETAMINOPHEN 325 MG PO TABS
650.00 | ORAL_TABLET | ORAL | Status: DC
Start: 2018-02-17 — End: 2018-02-17

## 2018-02-17 MED ORDER — SENNOSIDES-DOCUSATE SODIUM 8.6-50 MG PO TABS
2.00 | ORAL_TABLET | ORAL | Status: DC
Start: 2018-02-17 — End: 2018-02-17

## 2018-02-17 MED ORDER — HEPARIN SODIUM (PORCINE) 5000 UNIT/ML IJ SOLN
5000.00 | INTRAMUSCULAR | Status: DC
Start: 2018-02-17 — End: 2018-02-17

## 2018-02-17 MED ORDER — PANTOPRAZOLE SODIUM 20 MG PO TBEC
20.00 | DELAYED_RELEASE_TABLET | ORAL | Status: DC
Start: 2018-02-18 — End: 2018-02-17

## 2018-02-17 MED ORDER — ACYCLOVIR 200 MG PO CAPS
200.00 | ORAL_CAPSULE | ORAL | Status: DC
Start: 2018-02-17 — End: 2018-02-17

## 2018-02-19 MED FILL — ACYCLOVIR 200 MG CAP: 200 | 15 days supply | Qty: 60 | Fill #1

## 2018-02-20 MED FILL — FOLIC ACID 1 MG TABS: 1 | 30 days supply | Qty: 30 | Fill #1

## 2018-02-23 MED FILL — oxyCODONE HCL 5 MG TABS: 5 | 8 days supply | Qty: 45 | Fill #0

## 2018-02-28 ENCOUNTER — Other Ambulatory Visit: Payer: Self-pay

## 2018-02-28 ENCOUNTER — Ambulatory Visit: Payer: Self-pay | Admitting: Hematology

## 2018-03-02 NOTE — Progress Notes (Signed)
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 female who has been referred to [place] from [facility] for evaluation and management of B-cell lymphoma. She presents to her appointment today with her partner of 21 years. She states that she moved to the area ~2 years ago secondary to her sister's diagnosis of lung cancer (unfortunately, now passed) and taking care of her mother's Alzheimer's. Generally, prior to this issue she has been very healthy and without chronic medical conditions and not on chronic medical therapies/treatments.   Initially, the patient was admitted to [facility] for abdominal pain and significant hernia. She reports that she was visiting in [place] and while flying there she had an acute onset of sweats and left lower quadrant pain. She reports that her hernia was not present at that time, but as she continued to fly her hernia had become more noticeable and enlarged in her lower abdomen. Additionally, her partner reports that her episode of hydrosis was very significant, stating that it was pooling below her. On their arrival to [facility] on 06/21/17, a CT A/P was performed which showed an incarcerated hernia. Of note, her CT showed massive splenomegaly at 32cm in greatest dimension. Manual reduction of her hernia was attempted while in the ED but was unsuccessful. Pre-operative notes state that following intubation manual reduction was performed successfully and hernia was repaired laparoscopically. CT CAP was performed as well which was without the presence of additional surrounding LAD. She did have flow cytometry performed during her admission as well which showed B-cell lymphocytes which were monoclonal. A bone  marrow biopsy was also examined which showed that it was markedly hypercellular (~95%) with prominent interstitial infiltrate of small lymphocytes.   Prior to her diagnosis, she mostly felt typically well overall. She states that she did suffer from insomnia which she related to over-thinking at the time regarding her familial situation. She did experience some fatigue and weight loss with this as well. Her partner reports that she noticed her weight loss approximately 29moago, which she quantifies at approximately 30lbs since onset. Towards the end of this six month periods and just prior to the appearance of her hernia the patient began to notice some bothersome abdominal distension. She also noticed some significant fatigue and she would nap for longer periods following her daily workouts. Additionally, her partner would notice that she would have issues with breathing and this appeared irregular and more labored, especially at night while laying down. She also mentions that she did experience intermittent night sweats throughout this six month period as well. She denies lightheadedness, dizziness, abdominal pain, back pain, fever, chills, abnormal bruising/bleeding, bloody stools, epistaxis, hematuria, gingival bleeding, or other bleeding issues within this period. She has not received any blood transfusions and has not previously had a tattoo; she is unconcerned with prior HepC exposure.   Since her hernia surgery, she reports that she feels great symptomatically overall and much improved. She does have some issues with abdominal bloating and discomfort, but this has been manageable. No overt abdominal pain.   Throughout all of this, she has attempted to try to remain within good spirits. Losing her sister recently has been hard, but she has been working through it with her  family; however, her partner voices that they do not have strong social support in this area as most of their support is in [place]  where they previously lived. She has been working through her sister's death with grief counseling which has been helping her.    She has never previously smoked, but she reports some moderate second-hand smoke through her father at a younger age. Her father did die of a PE, but she is unaware of any other diagnoses of clotting disorders or cancers within her family. No h/o alcohol abuse, chemical/radiation exposures. She worked previously in Industrial/product designer.   PREVIOUS TREATMENT:  Rituximab weekly x4 beginning on 07/21/17-08/11/17 Received additional Rituxan weekly x 4 doses Rituximab  Weekly for 4 weeks starting 10/05/17 - completed 10/2017.  CURRENT TREATMENT:  Planning for splenectomy at [place] with Dr Shon Hough.   INTERIM HISTORY:   Michelle Cohen returns today for scheduled follow up of her SMZL. The patient's last visit with [place] was on 02/07/18. She is accompanied today by her partner. The pt reports that she is doing well overall.   Of note since the patient's last visit, pt has had a Splenectomy completed on 02/13/18 with Dr Shon Hough at [facility].  The pt reports that she received wonderful care while at [facility]. She notes that her appetite is still beginning to pick up. She notes some residual pain in her ribs but denies this being limiting. She was discharged with Lovenox and has 4 more injections to take. She notes that her skin still feels sensitive at the surgical site but is healing well.   She was also discharged her with Gabapentin and Protonix.   She notes that she is napping and eating as best she can and notes that her back pain.   Lab results today (03/05/18) of CBC, CMP, and Reticulocytes is as follows: all values are WNL except for WBC at 20.3k, HGB at 11.1, HCT at 33.7, RDW at 19.4, Platelets at 721k, Lymphs Abs at 15.6k. LDH 03/05/18 is WNL at 177 Ferritin 03/05/18 is at 64.  Iron/TIBC 03/05/18 shows a 13% saturation ratio. Vitamin B12 03/05/18 is  pending Folate RBC 03/05/18 is pending  On review of systems, pt reports night sweat resolution, some rib discomfort, expected post-op fatigue, resolved back pain, and denies nausea, vomiting, fevers, chills, problems moving her bowels, and any other symptoms.   MEDICAL HISTORY:  Past Medical History:  Diagnosis Date  . B-cell lymphoma ([place]) 06/2017    SURGICAL HISTORY: Past Surgical History:  Procedure Laterality Date  . left inguinal hernia repair Left     SOCIAL HISTORY: Social History   Socioeconomic History  . Marital status: Single    Spouse name: Not on file  . Number of children: Not on file  . Years of education: Not on file  . Highest education level: Not on file  Occupational History  . Not on file  Social Needs  . Financial resource strain: Not on file  . Food insecurity:    Worry: Not on file    Inability: Not on file  . Transportation needs:    Medical: Not on file    Non-medical: Not on file  Tobacco Use  . Smoking status: Never Smoker  . Smokeless tobacco: Never Used  Substance and Sexual Activity  . Alcohol use: Yes    Comment: glass of wine occasionally  . Drug use: No  . Sexual activity: Not on file  Lifestyle  . Physical activity:  Days per week: Not on file    Minutes per session: Not on file  . Stress: Not on file  Relationships  . Social connections:    Talks on phone: Not on file    Gets together: Not on file    Attends religious service: Not on file    Active member of club or organization: Not on file    Attends meetings of clubs or organizations: Not on file    Relationship status: Not on file  . Intimate partner violence:    Fear of current or ex partner: Not on file    Emotionally abused: Not on file    Physically abused: Not on file    Forced sexual activity: Not on file  Other Topics Concern  . Not on file  Social History Narrative  . Not on file    FAMILY HISTORY: Sister - lung cancer Mother -Alzheimers  dementia  ALLERGIES:  has No Known Allergies.  MEDICATIONS:  Current Outpatient Medications  Medication Sig Dispense Refill  . acetaminophen (TYLENOL) 500 MG tablet Take 500 mg by mouth every 8 (eight) hours as needed for moderate pain.     Marland Kitchen acyclovir (ZOVIRAX) 200 MG capsule Take 2 capsules (400 mg total) by mouth 2 (two) times daily. 60 capsule 1  . docusate sodium (COLACE) 100 MG capsule Take 100 mg by mouth 2 (two) times daily.    . folic acid (FOLVITE) 1 MG tablet Take 1 tablet (1 mg total) by mouth daily. 30 tablet 2  . hydrOXYzine (ATARAX/VISTARIL) 25 MG tablet Take 1 tablet (25 mg total) by mouth every 8 (eight) hours as needed for anxiety, nausea or vomiting. 30 tablet 0  . Loratadine 10 MG CAPS Take 10 mg by mouth daily.    . magic mouthwash w/lidocaine SOLN Take 5 mLs by mouth 4 (four) times daily as needed for mouth pain. Swish and spit 120 mL 0  . senna (SENOKOT) 8.6 MG TABS tablet Take 1 tablet by mouth daily as needed for mild constipation.      No current facility-administered medications for this visit.     REVIEW OF SYSTEMS:   A 10+ POINT REVIEW OF SYSTEMS WAS OBTAINED including neurology, dermatology, psychiatry, cardiac, respiratory, lymph, extremities, GI, GU, Musculoskeletal, constitutional, breasts, reproductive, HEENT.  All pertinent positives are noted in the HPI.  All others are negative.   PHYSICAL EXAMINATION:  ECOG PERFORMANCE STATUS: 1  GENERAL:alert, in no acute distress and comfortable SKIN: no acute rashes, no significant lesions EYES: conjunctiva are pink and non-injected, sclera anicteric OROPHARYNX: MMM, no exudates, no oropharyngeal erythema or ulceration NECK: supple, no JVD LYMPH:  no palpable lymphadenopathy in the cervical, axillary or inguinal regions LUNGS: clear to auscultation b/l with normal respiratory effort HEART: regular rate & rhythm ABDOMEN:  normoactive bowel sounds , non tender, not distended. Extremity: no pedal edema PSYCH:  alert & oriented x 3 with fluent speech NEURO: no focal motor/sensory deficits   LABORATORY DATA:  I have reviewed the data as listed   CBC Latest Ref Rng & Units 03/05/2018 02/07/2018 12/21/2017  WBC 3.9 - 10.3 K/uL 20.3(H) 4.4 5.0  Hemoglobin 11.6 - 15.9 g/dL 11.1(L) 8.0(L) 10.0(L)  Hematocrit 34.8 - 46.6 % 33.7(L) 25.7(L) 28.8(L)  Platelets 145 - 400 K/uL 721(H) 50(L) 65(L)  HGB 10 ANC 1k . CBC    Component Value Date/Time   WBC 20.3 (H) 03/05/2018 1331   RBC 4.03 03/05/2018 1331   RBC 4.03 03/05/2018 1331  HGB 11.1 (L) 03/05/2018 1331   HGB 8.0 (L) 02/07/2018 0905   HGB 9.1 (L) 09/14/2017 0835   HCT 33.7 (L) 03/05/2018 1331   HCT 29.0 (L) 09/14/2017 0835   PLT 721 (H) 03/05/2018 1331   PLT 50 (L) 02/07/2018 0905   PLT 46 (L) 09/14/2017 0835   MCV 83.7 03/05/2018 1331   MCV 92.1 09/14/2017 0835   MCH 27.5 03/05/2018 1331   MCHC 32.9 03/05/2018 1331   RDW 19.4 (H) 03/05/2018 1331   RDW 18.2 (H) 09/14/2017 0835   LYMPHSABS 15.6 (H) 03/05/2018 1331   LYMPHSABS 2.0 09/14/2017 0835   MONOABS 0.8 03/05/2018 1331   MONOABS 0.3 09/14/2017 0835   EOSABS 0.4 03/05/2018 1331   EOSABS 0.0 09/14/2017 0835   BASOSABS 0.1 03/05/2018 1331   BASOSABS 0.0 09/14/2017 0835    . CMP Latest Ref Rng & Units 03/05/2018 02/07/2018 12/21/2017  Glucose 70 - 140 mg/dL 96 104 105  BUN 7 - 26 mg/dL _0 Creatinine 0.60 - 1.10 mg/dL 0.75 0.95 0.89  Sodium 136 - 145 mmol/L 138 139 140  Potassium 3.5 - 5.1 mmol/L 4.3 4.1 4.7  Chloride 98 - 109 mmol/L 103 106 106  CO2 22 - 29 mmol/L _1 Calcium 8.4 - 10.4 mg/dL 9.9 9.1 9.2  Total Protein 6.4 - 8.3 g/dL 7.3 7.0 6.4  Total Bilirubin 0.2 - 1.2 mg/dL 0.5 2.6(H) 2.2(H)  Alkaline Phos 40 - 150 U/L 81 55 53  AST 5 - 34 U/L _2 ALT 0 - 55 U/L _3 Component     Latest Ref Rng & Units 07/13/2017  LDH     125 - 245 U/L 232  Hep C Virus Ab     0.0 - 0.9 s/co ratio <0.1  HIV Screen 4th Generation wRfx     Non Reactive Non  Reactive  Hep B Core Ab, Tot     Negative Negative  Hepatitis B Surface Ag     Negative Negative   Component     Latest Ref Rng & Units 07/28/2017  LDH     125 - 245 U/L 200  Uric Acid, Serum     2.6 - 7.4 mg/dl 6.0  Phosphorus     2.5 - 4.5 mg/dL 3.8   03/05/18 Pathology Report Splenectomy:      RADIOGRAPHIC STUDIES: I have personally reviewed the radiological images as listed and agreed with the findings in the report. No results found.   ASSESSMENT & PLAN:   Michelle Cohen is a wonderful 67 y.o. caucasian female who presents to our clinic to discuss ongoing management of the following:   1.  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  Her clinical presentation is also representative of this and that this may have been present over months to possibly years.  -With her pattern of involvement and results thus far it is likely that she has splenic marginal zone type lymphoma.  -PET scan on 07/25/2017 with results showing: Massive splenomegaly, splenic volume 5770 cubic cm, with homogeneous low grade activity throughout the spleen equal to that of the physiologic activity in the liver, and above the background blood pool activity in the mediastinum.  No pathologic adenopathy identified. No bony involvement noted.   -CT A/p on 08/23/17 with results of: Marked splenomegaly without substantial interval change in splenic size/volume. Borderline lymphadenopathy in the hepatoduodenal ligament. Aortic Atherosclerosis. I discussed this with the patient in great detail.  -CT A/p on 09/15/17 shows significant splenomegaly shows minimal smaller change in size, with no evidence of bowel obstruction.    Plan -Discussed pt labwork today, 03/05/18; all blood counts have picked up after splenectomy. PLT at 721k, HGB increased to 11.1, Lymphs abs at 15.6k.  -Finish Lovenox course and then begin 14m aspirin -Will watch for appearance of constitutional symptoms to determine if Rituxan is indicated -Will continue watching lymphocytes for now -Recommend continuing reasonable infection prevention precautions -Continue courses of Gabapentin and Protonix as suggested with post-op -Will see pt back in 6 weeks with labs   2. Pancytopenia with leukocytosis and anemia  Due to SMZL and hypersplenism Now resolved Plan -neutropenia ANC wnl 3.5k, improved, I have recommended infection precautions. -cannot safely use neulasta due to splenic size and risk for splenic rupture. Will need appropriate antibiotic prophylaxis if she chooses to proceed with splenectomy  - completed pre-splenectomy vaccinations. -anticipate pancytopenia will resolve after splenectomy.  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 Had to complete Rituxan at lower rate. Patient would not be a candidate for rapid infusion protocol and would need to keep the next infusion at the tolerated rate and not rate escalate. Plan  Completed with extensive pre-medications.  4. Oral mucositis/HSV outbreak - resolved -continue with acyclovir for prophylaxis   RTC with Dr KIrene Limboin 6 weeks with labs    The toal time spent in the appt was 35 minutes and more than 50% was on counseling and direct patient cares.   GSullivan LoneMD MYork HavenAAHIVMS SWest Florida Community Care CenterCVernon Mem HsptlHematology/Oncology Physician CE[facility] (Office):       [phone](Work cell):  [phone](Fax):           [phone] I, SBaldwin Jamaica am acting as a scribe for Dr KIrene Limbo   .I have reviewed the above documentation for accuracy and completeness, and I  agree with the above. .Brunetta GeneraMD

## 2018-03-05 ENCOUNTER — Inpatient Hospital Stay: Payer: Self-pay | Attending: Hematology

## 2018-03-05 ENCOUNTER — Inpatient Hospital Stay (HOSPITAL_BASED_OUTPATIENT_CLINIC_OR_DEPARTMENT_OTHER): Payer: Self-pay | Admitting: Hematology

## 2018-03-05 ENCOUNTER — Telehealth: Payer: Self-pay | Admitting: Hematology

## 2018-03-05 VITALS — BP 127/62 | HR 60 | Temp 98.5°F | Resp 18 | Ht 66.0 in | Wt 131.9 lb

## 2018-03-05 DIAGNOSIS — D709 Neutropenia, unspecified: Secondary | ICD-10-CM

## 2018-03-05 DIAGNOSIS — D649 Anemia, unspecified: Secondary | ICD-10-CM

## 2018-03-05 DIAGNOSIS — R5383 Other fatigue: Secondary | ICD-10-CM

## 2018-03-05 DIAGNOSIS — Z9081 Acquired absence of spleen: Secondary | ICD-10-CM

## 2018-03-05 DIAGNOSIS — D75838 Other thrombocytosis: Secondary | ICD-10-CM

## 2018-03-05 DIAGNOSIS — R7989 Other specified abnormal findings of blood chemistry: Secondary | ICD-10-CM

## 2018-03-05 DIAGNOSIS — D72829 Elevated white blood cell count, unspecified: Secondary | ICD-10-CM

## 2018-03-05 DIAGNOSIS — C8307 Small cell B-cell lymphoma, spleen: Secondary | ICD-10-CM

## 2018-03-05 DIAGNOSIS — M549 Dorsalgia, unspecified: Secondary | ICD-10-CM | POA: Insufficient documentation

## 2018-03-05 DIAGNOSIS — D696 Thrombocytopenia, unspecified: Secondary | ICD-10-CM

## 2018-03-05 LAB — CBC WITH DIFFERENTIAL/PLATELET
Basophils Absolute: 0.1 10*3/uL (ref 0.0–0.1)
Basophils Relative: 0 %
EOS ABS: 0.4 10*3/uL (ref 0.0–0.5)
EOS PCT: 2 %
HCT: 33.7 % — ABNORMAL LOW (ref 34.8–46.6)
HEMOGLOBIN: 11.1 g/dL — AB (ref 11.6–15.9)
LYMPHS ABS: 15.6 10*3/uL — AB (ref 0.9–3.3)
LYMPHS PCT: 77 %
MCH: 27.5 pg (ref 25.1–34.0)
MCHC: 32.9 g/dL (ref 31.5–36.0)
MCV: 83.7 fL (ref 79.5–101.0)
MONOS PCT: 4 %
Monocytes Absolute: 0.8 10*3/uL (ref 0.1–0.9)
NEUTROS PCT: 17 %
Neutro Abs: 3.5 10*3/uL (ref 1.5–6.5)
Platelets: 721 10*3/uL — ABNORMAL HIGH (ref 145–400)
RBC: 4.03 MIL/uL (ref 3.70–5.45)
RDW: 19.4 % — ABNORMAL HIGH (ref 11.2–14.5)
WBC: 20.3 10*3/uL — ABNORMAL HIGH (ref 3.9–10.3)

## 2018-03-05 LAB — CMP (CANCER CENTER ONLY)
ALBUMIN: 3.7 g/dL (ref 3.5–5.0)
ALK PHOS: 81 U/L (ref 40–150)
ALT: 25 U/L (ref 0–55)
AST: 19 U/L (ref 5–34)
Anion gap: 9 (ref 3–11)
BUN: 15 mg/dL (ref 7–26)
CALCIUM: 9.9 mg/dL (ref 8.4–10.4)
CHLORIDE: 103 mmol/L (ref 98–109)
CO2: 26 mmol/L (ref 22–29)
CREATININE: 0.75 mg/dL (ref 0.60–1.10)
GFR, Est AFR Am: >60 mL/min (ref >60)
GFR, Estimated: >60 mL/min (ref >60)
GLUCOSE: 96 mg/dL (ref 70–140)
Potassium: 4.3 mmol/L (ref 3.5–5.1)
SODIUM: 138 mmol/L (ref 136–145)
Total Bilirubin: 0.5 mg/dL (ref 0.2–1.2)
Total Protein: 7.3 g/dL (ref 6.4–8.3)

## 2018-03-05 LAB — RETICULOCYTES
RBC.: 4.03 MIL/uL (ref 3.70–5.45)
Retic Count, Absolute: 60.5 10*3/uL (ref 33.7–90.7)
Retic Ct Pct: 1.5 % (ref 0.7–2.1)

## 2018-03-05 LAB — IRON AND TIBC
IRON: 40 ug/dL — AB (ref 41–142)
SATURATION RATIOS: 13 % — AB (ref 21–57)
TIBC: 315 ug/dL (ref 236–444)
UIBC: 275 ug/dL

## 2018-03-05 LAB — VITAMIN B12: VITAMIN B 12: 260 pg/mL (ref 180–914)

## 2018-03-05 LAB — FERRITIN: Ferritin: 64 ng/mL (ref 9–269)

## 2018-03-05 LAB — LACTATE DEHYDROGENASE: LDH: 177 U/L (ref 125–245)

## 2018-03-05 NOTE — Telephone Encounter (Signed)
Appointments scheduled AVS/Calendar printed per 6/10 los °

## 2018-03-06 LAB — FOLATE RBC
Folate, RBC: >1761 ng/mL (ref >498)
Hematocrit: 35.2 % (ref 34.0–46.6)

## 2018-03-24 MED FILL — FOLIC ACID 1 MG TABS: 1 | 30 days supply | Qty: 30 | Fill #2

## 2018-03-27 ENCOUNTER — Other Ambulatory Visit: Payer: Self-pay

## 2018-03-27 MED ORDER — GABAPENTIN 100 MG PO CAPS: 100.0000 mg | ORAL_CAPSULE | Freq: Three times a day (TID) | ORAL | 0 refills | Status: DC

## 2018-03-27 MED FILL — GABAPENTIN 100 MG CAPSULE: 100 | 30 days supply | Qty: 90 | Fill #0

## 2018-03-27 NOTE — Progress Notes (Signed)
[place]

## 2018-03-30 ENCOUNTER — Encounter (HOSPITAL_COMMUNITY): Payer: Self-pay | Admitting: Emergency Medicine

## 2018-03-30 ENCOUNTER — Other Ambulatory Visit: Payer: Self-pay

## 2018-03-30 ENCOUNTER — Ambulatory Visit (HOSPITAL_COMMUNITY)
Admission: EM | Admit: 2018-03-30 | Discharge: 2018-03-30 | Disposition: A | Discharge status: Home / Self Care | Payer: Self-pay | Attending: Family Medicine | Admitting: Family Medicine

## 2018-03-30 DIAGNOSIS — L247 Irritant contact dermatitis due to plants, except food: Secondary | ICD-10-CM

## 2018-03-30 MED ORDER — METHYLPREDNISOLONE ACETATE 80 MG/ML IJ SUSP
INTRAMUSCULAR | Status: AC
  Filled 2018-03-30: qty 1

## 2018-03-30 MED ORDER — METHYLPREDNISOLONE ACETATE 40 MG/ML IJ SUSP
80.0000 mg | Freq: Once | INTRAMUSCULAR | Status: AC
  Administered 2018-03-30: 80 mg via INTRAMUSCULAR

## 2018-03-30 MED ORDER — TRIAMCINOLONE ACETONIDE 0.1 % EX CREA: 1.0000 "application " | TOPICAL_CREAM | Freq: Two times a day (BID) | CUTANEOUS | 0 refills | Status: DC

## 2018-03-30 MED ORDER — HYDROXYZINE HCL 25 MG PO TABS: 25.0000 mg | ORAL_TABLET | ORAL | 0 refills | Status: DC | PRN

## 2018-03-30 NOTE — Discharge Instructions (Signed)
I have given you an injection of prednisone This weill last several days Take the atarax ( hydroxyzine ) as needed for itching Use the cream 2 x a day Return if no improvement in 2-3 days Will take a week or more to completely resolve

## 2018-03-30 NOTE — ED Provider Notes (Signed)
[facility]    CSN: [id number] Arrival date & time: 03/30/18  7672     History   Chief Complaint Chief Complaint  Patient presents with  . Rash    HPI Michelle Cohen is a 67 y.o. female.   HPI  Patient is here for evaluation of a rash.  It itches terribly.  It is spreading.  She states that started 2 days ago on the right side of her face.  Is now spreading down around her neck.  She has other spots breaking out on both arms.  Few on anterior chest.  She is very allergic to poison ivy.  Denies any exposure to poison ivy.  After questioning, however, she was outdoor when a neighbor was mowing through poison ivy and it was potentially aerosolized.  It is the only exposure she can think of. Patient is immune compromised.  Recently treated for lymphoma.  Recently had her spleen removed.  Her blood work has been abnormal, with a recent spike in her white count and platelets.  Theorized by her oncologist to be due to the splenectomy.  Past Medical History:  Diagnosis Date  . B-cell lymphoma ([place]) 06/2017    Patient Active Problem List   Diagnosis Date Noted  . Counseling regarding advanced care planning and goals of care 07/31/2017  . Splenic marginal zone b-cell lymphoma ([place]) 07/13/2017    Past Surgical History:  Procedure Laterality Date  . left inguinal hernia repair Left   . SPLENECTOMY, TOTAL  02/13/2018    OB History   None      Home Medications    Prior to Admission medications   Medication Sig Start Date End Date Taking? Authorizing Provider  acetaminophen (TYLENOL) 500 MG tablet Take 500 mg by mouth every 8 (eight) hours as needed for moderate pain.    Yes [provider]  acyclovir (ZOVIRAX) 200 MG capsule Take 2 capsules (400 mg total) by mouth 2 (two) times daily. 12/28/17  Yes Brunetta Genera, MD  docusate sodium (COLACE) 100 MG capsule Take 100 mg by mouth 2 (two) times daily.   Yes [provider]  folic acid (FOLVITE) 1 MG  tablet Take 1 tablet (1 mg total) by mouth daily. 12/28/17  Yes Brunetta Genera, MD  gabapentin (NEURONTIN) 100 MG capsule Take 1 capsule (100 mg total) by mouth 3 (three) times daily. 03/27/18  Yes Brunetta Genera, MD  Loratadine 10 MG CAPS Take 10 mg by mouth daily.   Yes [provider]  oxyCODONE-acetaminophen (PERCOCET/ROXICET) 5-325 MG tablet Take 1 tablet by mouth every 4 (four) hours as needed for severe pain.   Yes [provider]  senna (SENOKOT) 8.6 MG TABS tablet Take 1 tablet by mouth daily as needed for mild constipation.    Yes [provider]  hydrOXYzine (ATARAX/VISTARIL) 25 MG tablet Take 1-2 tablets (25-50 mg total) by mouth every 4 (four) hours as needed. 03/30/18   Raylene Everts, MD  triamcinolone cream (KENALOG) 0.1 % Apply 1 application topically 2 (two) times daily. 03/30/18   Raylene Everts, MD    Family History Family History  Problem Relation Age of Onset  . Lung cancer Sister     Social History Social History   Tobacco Use  . Smoking status: Never Smoker  . Smokeless tobacco: Never Used  Substance Use Topics  . Alcohol use: Yes    Comment: glass of wine occasionally  . Drug use: No  Allergies   Patient has no known allergies.   Review of Systems Review of Systems  Constitutional: Negative for chills and fever.  HENT: Negative for ear pain and sore throat.   Eyes: Negative for pain and visual disturbance.  Respiratory: Negative for cough and shortness of breath.   Cardiovascular: Negative for chest pain and palpitations.  Gastrointestinal: Positive for abdominal pain. Negative for vomiting.       Still some soreness from surgery  Genitourinary: Negative for dysuria and hematuria.  Musculoskeletal: Negative for arthralgias and back pain.  Skin: Positive for rash. Negative for color change.  Neurological: Negative for seizures and syncope.  All other systems reviewed and are negative.    Physical  Exam Triage Vital Signs ED Triage Vitals  Enc Vitals Group     BP 03/30/18 1909 138/72     Pulse Rate 03/30/18 1909 74     Resp 03/30/18 1909 16     Temp 03/30/18 1909 98.1 F (36.7 C)     Temp Source 03/30/18 1909 Oral     SpO2 03/30/18 1909 98 %     Weight --      Height --      Head Circumference --      Peak Flow --      Pain Score 03/30/18 1906 0     Pain Loc --      Pain Edu? --      Excl. in [place]? --    No data found.  Updated Vital Signs BP 138/72 (BP Location: Left Arm)   Pulse 74   Temp 98.1 F (36.7 C) (Oral)   Resp 16   SpO2 98%       Physical Exam  Constitutional: She appears well-developed and well-nourished. No distress.  HENT:  Head: Normocephalic and atraumatic.  Right Ear: External ear normal.  Left Ear: External ear normal.  Mouth/Throat: Oropharynx is clear and moist.  Eyes: Pupils are equal, round, and reactive to light. Conjunctivae are normal.  Neck: Normal range of motion. Neck supple.  Cardiovascular: Normal rate, regular rhythm and normal heart sounds.  Pulmonary/Chest: Effort normal and breath sounds normal. No respiratory distress.  Abdominal: Soft. She exhibits no distension.  No tenderness.  No organomegaly.  Well-healed abdominal scarring  Musculoskeletal: Normal range of motion. She exhibits no edema.  Neurological: She is alert.  Skin: Skin is warm and dry.  Patches of clustered vesicles on the right side of the face down the neck.  Erythema, deep colored under the chin.  Mild soft tissue swelling under the chin and along the right jawline.  Few clustered vesicles on both forearm and wrists  Psychiatric: She has a normal mood and affect. Her behavior is normal.     UC Treatments / Results  Labs (all labs ordered are listed, but only abnormal results are displayed) Labs Reviewed - No data to display  EKG None  Radiology No results found.  Procedures Procedures (including critical care time)  Medications Ordered in  UC Medications  methylPREDNISolone acetate (DEPO-MEDROL) injection 80 mg (80 mg Intramuscular Given 03/30/18 2011)    Initial Impression / Assessment and Plan / UC Course  I have reviewed the triage vital signs and the nursing notes.  Pertinent labs & imaging results that were available during my care of the patient were reviewed by me and considered in my medical decision making (see chart for details).     Discussed likely contact dermatitis.  Possibly due to aerosolized poison ivy.  Treatment with steroids, steroid cream, antihistamines. Final Clinical Impressions(s) / UC Diagnoses   Final diagnoses:  Irritant contact dermatitis due to plants, except food     Discharge Instructions     I have given you an injection of prednisone This weill last several days Take the atarax ( hydroxyzine ) as needed for itching Use the cream 2 x a day Return if no improvement in 2-3 days Will take a week or more to completely resolve   ED Prescriptions    Medication Sig Dispense Auth. Provider   triamcinolone cream (KENALOG) 0.1 % Apply 1 application topically 2 (two) times daily. 30 g Raylene Everts, MD   hydrOXYzine (ATARAX/VISTARIL) 25 MG tablet Take 1-2 tablets (25-50 mg total) by mouth every 4 (four) hours as needed. 20 tablet Raylene Everts, MD     Controlled Substance Prescriptions [place] Controlled Substance Registry consulted? Not Applicable   Raylene Everts, MD 03/30/18 2209

## 2018-03-30 NOTE — ED Triage Notes (Signed)
The patient presented to the [facility] with a complaint of a rash to her face that started after some insect bites 2 days ago.

## 2018-04-05 DIAGNOSIS — Z9081 Acquired absence of spleen: Secondary | ICD-10-CM | POA: Insufficient documentation

## 2018-04-09 NOTE — Progress Notes (Signed)
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 female who has been referred to [place] from [facility] for evaluation and management of B-cell lymphoma. She presents to her appointment today with her partner of 21 years. She states that she moved to the area ~2 years ago secondary to her sister's diagnosis of lung cancer (unfortunately, now passed) and taking care of her mother's Alzheimer's. Generally, prior to this issue she has been very healthy and without chronic medical conditions and not on chronic medical therapies/treatments.   Initially, the patient was admitted to [facility] for abdominal pain and significant hernia. She reports that she was visiting in [place] and while flying there she had an acute onset of sweats and left lower quadrant pain. She reports that her hernia was not present at that time, but as she continued to fly her hernia had become more noticeable and enlarged in her lower abdomen. Additionally, her partner reports that her episode of hydrosis was very significant, stating that it was pooling below her. On their arrival to [facility] on 06/21/17, a CT A/P was performed which showed an incarcerated hernia. Of note, her CT showed massive splenomegaly at 32cm in greatest dimension. Manual reduction of her hernia was attempted while in the ED but was unsuccessful. Pre-operative notes state that following intubation manual reduction was performed successfully and hernia was repaired laparoscopically. CT CAP was performed as well which was without the presence of additional surrounding LAD. She did have flow cytometry performed during her admission as well which showed B-cell lymphocytes which were monoclonal.  A bone marrow biopsy was also examined which showed that it was markedly hypercellular (~95%) with prominent interstitial infiltrate of small lymphocytes.   Prior to her diagnosis, she mostly felt typically well overall. She states that she did suffer from insomnia which she related to over-thinking at the time regarding her familial situation. She did experience some fatigue and weight loss with this as well. Her partner reports that she noticed her weight loss approximately 43moago, which she quantifies at approximately 30lbs since onset. Towards the end of this six month periods and just prior to the appearance of her hernia the patient began to notice some bothersome abdominal distension. She also noticed some significant fatigue and she would nap for longer periods following her daily workouts. Additionally, her partner would notice that she would have issues with breathing and this appeared irregular and more labored, especially at night while laying down. She also mentions that she did experience intermittent night sweats throughout this six month period as well. She denies lightheadedness, dizziness, abdominal pain, back pain, fever, chills, abnormal bruising/bleeding, bloody stools, epistaxis, hematuria, gingival bleeding, or other bleeding issues within this period. She has not received any blood transfusions and has not previously had a tattoo; she is unconcerned with prior HepC exposure.   Since her hernia surgery, she reports that she feels great symptomatically overall and much improved. She does have some issues with abdominal bloating and discomfort, but this has been manageable. No overt abdominal pain.   Throughout all of this, she has attempted to try to remain within good spirits. Losing her sister recently has been hard, but she has been working through it with  her family; however, her partner voices that they do not have strong social support in this area as most of their support is in  [place] where they previously lived. She has been working through her sister's death with grief counseling which has been helping her.    She has never previously smoked, but she reports some moderate second-hand smoke through her father at a younger age. Her father did die of a PE, but she is unaware of any other diagnoses of clotting disorders or cancers within her family. No h/o alcohol abuse, chemical/radiation exposures. She worked previously in Industrial/product designer.   PREVIOUS TREATMENT:  Rituximab weekly x4 beginning on 07/21/17-08/11/17 Received additional Rituxan weekly x 4 doses Rituximab  Weekly for 4 weeks starting 10/05/17 - completed 10/2017.  CURRENT TREATMENT:  Planning for splenectomy at [place] with Dr Shon Hough.   INTERIM HISTORY:   Michelle Cohen returns today for scheduled follow up of her SMZL. The patient's last visit with [place] was on 03/05/18. She is accompanied today by partner. The pt reports that she is doing well overall and is healing well from her surgery. Hgb has improved to 13.3. PLT stabilizing to 430k. No fevers/chills/night sweats/weight loss.  MEDICAL HISTORY:  Past Medical History:  Diagnosis Date  . B-cell lymphoma ([place]) 06/2017    SURGICAL HISTORY: Past Surgical History:  Procedure Laterality Date  . left inguinal hernia repair Left   . SPLENECTOMY, TOTAL  02/13/2018    SOCIAL HISTORY: Social History   Socioeconomic History  . Marital status: Single    Spouse name: Not on file  . Number of children: Not on file  . Years of education: Not on file  . Highest education level: Not on file  Occupational History  . Not on file  Social Needs  . Financial resource strain: Not on file  . Food insecurity:    Worry: Not on file    Inability: Not on file  . Transportation needs:    Medical: Not on file    Non-medical: Not on file  Tobacco Use  . Smoking status: Never Smoker  . Smokeless tobacco: Never Used  Substance and Sexual  Activity  . Alcohol use: Yes    Comment: glass of wine occasionally  . Drug use: No  . Sexual activity: Not on file  Lifestyle  . Physical activity:    Days per week: Not on file    Minutes per session: Not on file  . Stress: Not on file  Relationships  . Social connections:    Talks on phone: Not on file    Gets together: Not on file    Attends religious service: Not on file    Active member of club or organization: Not on file    Attends meetings of clubs or organizations: Not on file    Relationship status: Not on file  . Intimate partner violence:    Fear of current or ex partner: Not on file    Emotionally abused: Not on file    Physically abused: Not on file    Forced sexual activity: Not on file  Other Topics Concern  . Not on file  Social History Narrative  . Not on file    FAMILY HISTORY: Sister - lung cancer Mother -Alzheimers dementia  ALLERGIES:  has No Known Allergies.  MEDICATIONS:  Current Outpatient Medications  Medication Sig Dispense Refill  . acetaminophen (TYLENOL) 500 MG tablet Take 500 mg by mouth every 8 (eight) hours  as needed for moderate pain.     Marland Kitchen acyclovir (ZOVIRAX) 200 MG capsule Take 2 capsules (400 mg total) by mouth 2 (two) times daily. 60 capsule 1  . docusate sodium (COLACE) 100 MG capsule Take 100 mg by mouth 2 (two) times daily.    . folic acid (FOLVITE) 1 MG tablet Take 1 tablet (1 mg total) by mouth daily. 30 tablet 2  . gabapentin (NEURONTIN) 100 MG capsule Take 1 capsule (100 mg total) by mouth 3 (three) times daily. 90 capsule 0  . hydrOXYzine (ATARAX/VISTARIL) 25 MG tablet Take 1-2 tablets (25-50 mg total) by mouth every 4 (four) hours as needed. 20 tablet 0  . Loratadine 10 MG CAPS Take 10 mg by mouth daily.    Marland Kitchen oxyCODONE-acetaminophen (PERCOCET/ROXICET) 5-325 MG tablet Take 1 tablet by mouth every 4 (four) hours as needed for severe pain.    Marland Kitchen senna (SENOKOT) 8.6 MG TABS tablet Take 1 tablet by mouth daily as needed for mild  constipation.     . triamcinolone cream (KENALOG) 0.1 % Apply 1 application topically 2 (two) times daily. 30 g 0   No current facility-administered medications for this visit.     REVIEW OF SYSTEMS:   A 10+ POINT REVIEW OF SYSTEMS WAS OBTAINED including neurology, dermatology, psychiatry, cardiac, respiratory, lymph, extremities, GI, GU, Musculoskeletal, constitutional, breasts, reproductive, HEENT.  All pertinent positives are noted in the HPI.  All others are negative.   PHYSICAL EXAMINATION:  ECOG PERFORMANCE STATUS: 1 .BP 118/72 (BP Location: Left Arm, Patient Position: Sitting)   Pulse 65   Temp 98.4 F (36.9 C) (Oral)   Resp 18   Ht '5\' 6"'$  (1.676 m)   Wt 145 lb 12.8 oz (66.1 kg)   SpO2 98%   BMI 23.53 kg/m   GENERAL:alert, in no acute distress and comfortable SKIN: no acute rashes, no significant lesions EYES: conjunctiva are pink and non-injected, sclera anicteric OROPHARYNX: MMM, no exudates, no oropharyngeal erythema or ulceration NECK: supple, no JVD LYMPH:  no palpable lymphadenopathy in the cervical, axillary or inguinal regions LUNGS: clear to auscultation b/l with normal respiratory effort HEART: regular rate & rhythm ABDOMEN:  normoactive bowel sounds , non tender, not distended. Extremity: no pedal edema PSYCH: alert & oriented x 3 with fluent speech NEURO: no focal motor/sensory deficits    LABORATORY DATA:   I have reviewed the data as listed   CBC Latest Ref Rng & Units 04/16/2018 03/05/2018 03/05/2018  WBC 3.9 - 10.3 K/uL 22.9(H) 20.3(H) -  Hemoglobin 11.6 - 15.9 g/dL 13.3 11.1(L) -  Hematocrit 34.8 - 46.6 % 40.5 33.7(L) 35.2  Platelets 145 - 400 K/uL 430(H) 721(H) -  ANC 3.2k . CBC    Component Value Date/Time   WBC 22.9 (H) 04/16/2018 0853   RBC 4.64 04/16/2018 0853   HGB 13.3 04/16/2018 0853   HGB 8.0 (L) 02/07/2018 0905   HGB 9.1 (L) 09/14/2017 0835   HCT 40.5 04/16/2018 0853   HCT 35.2 03/05/2018 1331   HCT 29.0 (L) 09/14/2017 0835    PLT 430 (H) 04/16/2018 0853   PLT 50 (L) 02/07/2018 0905   PLT 46 (L) 09/14/2017 0835   MCV 87.2 04/16/2018 0853   MCV 92.1 09/14/2017 0835   MCH 28.7 04/16/2018 0853   MCHC 32.9 04/16/2018 0853   RDW 19.8 (H) 04/16/2018 0853   RDW 18.2 (H) 09/14/2017 0835   LYMPHSABS 18.0 (H) 04/16/2018 0853   LYMPHSABS 2.0 09/14/2017 0835   MONOABS 1.1 (H)  04/16/2018 0853   MONOABS 0.3 09/14/2017 0835   EOSABS 0.5 04/16/2018 0853   EOSABS 0.0 09/14/2017 0835   BASOSABS 0.1 04/16/2018 0853   BASOSABS 0.0 09/14/2017 0835    . CMP Latest Ref Rng & Units 04/16/2018 03/05/2018 02/07/2018  Glucose 70 - 99 mg/dL 81 96 104  BUN 8 - 23 mg/dL _0 Creatinine 0.44 - 1.00 mg/dL 0.81 0.75 0.95  Sodium 135 - 145 mmol/L 141 138 139  Potassium 3.5 - 5.1 mmol/L 4.5 4.3 4.1  Chloride 98 - 111 mmol/L 105 103 106  CO2 22 - 32 mmol/L _1 Calcium 8.9 - 10.3 mg/dL 9.8 9.9 9.1  Total Protein 6.5 - 8.1 g/dL 7.6 7.3 7.0  Total Bilirubin 0.3 - 1.2 mg/dL 0.9 0.5 2.6(H)  Alkaline Phos 38 - 126 U/L 82 81 55  AST 15 - 41 U/L _2 ALT 0 - 44 U/L 34 25 11   Component     Latest Ref Rng & Units 07/13/2017  LDH     125 - 245 U/L 232  Hep C Virus Ab     0.0 - 0.9 s/co ratio <0.1  HIV Screen 4th Generation wRfx     Non Reactive Non Reactive  Hep B Core Ab, Tot     Negative Negative  Hepatitis B Surface Ag     Negative Negative   Component     Latest Ref Rng & Units 07/28/2017  LDH     125 - 245 U/L 200  Uric Acid, Serum     2.6 - 7.4 mg/dl 6.0  Phosphorus     2.5 - 4.5 mg/dL 3.8   03/05/18 Pathology Report Splenectomy:      RADIOGRAPHIC STUDIES: I have personally reviewed the radiological images as listed and agreed with the findings in the report. No results found.   ASSESSMENT & PLAN:   Naraly Fritcher is a wonderful 67 y.o. caucasian female who presents to our clinic to discuss ongoing management of the following:   1.  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  Her clinical presentation is also representative of this and that this may have been present over months to possibly years.  -With her pattern of involvement and results thus far it is likely that she has splenic marginal zone type lymphoma.  -PET scan on 07/25/2017 with results showing: Massive splenomegaly, splenic volume 5770 cubic cm, with homogeneous low grade activity throughout the spleen equal to that of the physiologic activity in the liver, and above the background blood pool activity in the mediastinum. No pathologic adenopathy identified. No bony involvement noted.   -CT A/p on 08/23/17 with results of: Marked splenomegaly without substantial interval change in splenic size/volume. Borderline lymphadenopathy in the hepatoduodenal ligament. Aortic Atherosclerosis. I discussed this with the patient in great detail.  -CT A/p on 09/15/17 shows significant splenomegaly shows minimal smaller change in size, with no evidence of bowel obstruction.   PLAN:  -labs done today improved hgb to 13.3. PLT stabilizing at 430K -would expect chronic leucocytosis and thrombocytosis to some extent from post splenectomy.  -Recommend continuing reasonable infection prevention precautions -Continue courses of Gabapentin and Protonix as suggested with post-op  2.  Pancytopenia with leukocytosis and anemia  Due to SMZL and hypersplenism Now resolved  PLAN: - completed pre-splenectomy vaccinations. -pancytopenia resolved post-splenectomy  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 Had to complete Rituxan at lower rate.  Patient would not be a candidate for rapid infusion protocol and would need to keep the next infusion at the tolerated rate and not rate escalate. Plan  Completed with extensive pre-medications.  4. Oral mucositis/HSV outbreak - resolved -continue with acyclovir for prophylaxis  . The total time spent in the appointment was 20 minutes and more than 50% was on counseling and direct patient cares.  Sullivan Lone MD [place] AAHIVMS [facility] Hematology/Oncology Physician [facility]  (Office):       [phone] (Work cell):  [phone] (Fax):           [phone]

## 2018-04-16 ENCOUNTER — Telehealth: Payer: Self-pay | Admitting: Hematology

## 2018-04-16 ENCOUNTER — Inpatient Hospital Stay (HOSPITAL_BASED_OUTPATIENT_CLINIC_OR_DEPARTMENT_OTHER): Payer: Self-pay | Admitting: Hematology

## 2018-04-16 ENCOUNTER — Inpatient Hospital Stay: Payer: Self-pay | Attending: Hematology

## 2018-04-16 VITALS — BP 118/72 | HR 65 | Temp 98.4°F | Resp 18 | Ht 66.0 in | Wt 145.8 lb

## 2018-04-16 DIAGNOSIS — Z9081 Acquired absence of spleen: Secondary | ICD-10-CM

## 2018-04-16 DIAGNOSIS — R634 Abnormal weight loss: Secondary | ICD-10-CM

## 2018-04-16 DIAGNOSIS — G4700 Insomnia, unspecified: Secondary | ICD-10-CM

## 2018-04-16 DIAGNOSIS — C8307 Small cell B-cell lymphoma, spleen: Secondary | ICD-10-CM | POA: Insufficient documentation

## 2018-04-16 DIAGNOSIS — Z79899 Other long term (current) drug therapy: Secondary | ICD-10-CM

## 2018-04-16 DIAGNOSIS — R7989 Other specified abnormal findings of blood chemistry: Secondary | ICD-10-CM

## 2018-04-16 DIAGNOSIS — D75838 Other thrombocytosis: Secondary | ICD-10-CM

## 2018-04-16 DIAGNOSIS — D72829 Elevated white blood cell count, unspecified: Secondary | ICD-10-CM

## 2018-04-16 LAB — CBC WITH DIFFERENTIAL/PLATELET
BASOS ABS: 0.1 10*3/uL (ref 0.0–0.1)
BASOS PCT: 0 %
EOS ABS: 0.5 10*3/uL (ref 0.0–0.5)
Eosinophils Relative: 2 %
HCT: 40.5 % (ref 34.8–46.6)
HEMOGLOBIN: 13.3 g/dL (ref 11.6–15.9)
Lymphocytes Relative: 79 %
Lymphs Abs: 18 10*3/uL — ABNORMAL HIGH (ref 0.9–3.3)
MCH: 28.7 pg (ref 25.1–34.0)
MCHC: 32.9 g/dL (ref 31.5–36.0)
MCV: 87.2 fL (ref 79.5–101.0)
Monocytes Absolute: 1.1 10*3/uL — ABNORMAL HIGH (ref 0.1–0.9)
Monocytes Relative: 5 %
NEUTROS PCT: 14 %
Neutro Abs: 3.2 10*3/uL (ref 1.5–6.5)
Platelets: 430 10*3/uL — ABNORMAL HIGH (ref 145–400)
RBC: 4.64 MIL/uL (ref 3.70–5.45)
RDW: 19.8 % — ABNORMAL HIGH (ref 11.2–14.5)
WBC: 22.9 10*3/uL — ABNORMAL HIGH (ref 3.9–10.3)

## 2018-04-16 LAB — CMP (CANCER CENTER ONLY)
ALT: 34 U/L (ref 0–44)
AST: 25 U/L (ref 15–41)
Albumin: 4 g/dL (ref 3.5–5.0)
Alkaline Phosphatase: 82 U/L (ref 38–126)
Anion gap: 10 (ref 5–15)
BUN: 18 mg/dL (ref 8–23)
CO2: 26 mmol/L (ref 22–32)
Calcium: 9.8 mg/dL (ref 8.9–10.3)
Chloride: 105 mmol/L (ref 98–111)
Creatinine: 0.81 mg/dL (ref 0.44–1.00)
GFR, Est AFR Am: >60 mL/min
GFR, Estimated: >60 mL/min
Glucose, Bld: 81 mg/dL (ref 70–99)
Potassium: 4.5 mmol/L (ref 3.5–5.1)
Sodium: 141 mmol/L (ref 135–145)
Total Bilirubin: 0.9 mg/dL (ref 0.3–1.2)
Total Protein: 7.6 g/dL (ref 6.5–8.1)

## 2018-04-16 LAB — LACTATE DEHYDROGENASE: LDH: 196 U/L — ABNORMAL HIGH (ref 98–192)

## 2018-04-16 MED ORDER — CHLORHEXIDINE GLUCONATE 0.12 % MT SOLN: 15.0000 mL | Freq: Two times a day (BID) | OROMUCOSAL | 0 refills | Status: DC

## 2018-04-16 MED ORDER — GABAPENTIN 100 MG PO CAPS: ORAL_CAPSULE | ORAL | 1 refills | Status: DC

## 2018-04-16 MED ORDER — ACYCLOVIR 200 MG PO CAPS: 400.0000 mg | ORAL_CAPSULE | Freq: Two times a day (BID) | ORAL | 1 refills | Status: DC

## 2018-04-16 MED FILL — GABAPENTIN 100 MG CAPSULE: 100 | 30 days supply | Qty: 150 | Fill #0

## 2018-04-16 MED FILL — CHLORHEXIDINE 0.12% RINSE: 0.12 | 16 days supply | Qty: 473 | Fill #0

## 2018-04-16 MED FILL — ACYCLOVIR 200 MG CAP: 200 | 30 days supply | Qty: 120 | Fill #0

## 2018-04-16 NOTE — Telephone Encounter (Signed)
Scheduled appt per 7/22 los - gave patient AVS and calender per los.  

## 2018-05-21 ENCOUNTER — Other Ambulatory Visit: Payer: Self-pay

## 2018-05-21 MED ORDER — ACYCLOVIR 200 MG PO CAPS: 400.0000 mg | ORAL_CAPSULE | Freq: Two times a day (BID) | ORAL | 1 refills | Status: DC

## 2018-05-21 MED ORDER — CHLORHEXIDINE GLUCONATE 0.12 % MT SOLN: 15.0000 mL | Freq: Two times a day (BID) | OROMUCOSAL | 0 refills | Status: DC

## 2018-05-21 MED ORDER — GABAPENTIN 100 MG PO CAPS: ORAL_CAPSULE | ORAL | 1 refills | Status: DC

## 2018-05-21 MED FILL — ACYCLOVIR 200 MG CAP: 200 | 30 days supply | Qty: 120 | Fill #0

## 2018-05-21 MED FILL — GABAPENTIN 100 MG CAPSULE: 100 | 30 days supply | Qty: 150 | Fill #0

## 2018-05-21 MED FILL — CHLORHEXIDINE 0.12% RINSE: 0.12 | 15 days supply | Qty: 473 | Fill #0

## 2018-06-14 NOTE — Progress Notes (Signed)
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 female who has been referred to [place] from [facility] for evaluation and management of B-cell lymphoma. She presents to her appointment today with her partner of 21 years. She states that she moved to the area ~2 years ago secondary to her sister's diagnosis of lung cancer (unfortunately, now passed) and taking care of her mother's Alzheimer's. Generally, prior to this issue she has been very healthy and without chronic medical conditions and not on chronic medical therapies/treatments.   Initially, the patient was admitted to [facility] for abdominal pain and significant hernia. She reports that she was visiting in [place] and while flying there she had an acute onset of sweats and left lower quadrant pain. She reports that her hernia was not present at that time, but as she continued to fly her hernia had become more noticeable and enlarged in her lower abdomen. Additionally, her partner reports that her episode of hydrosis was very significant, stating that it was pooling below her. On their arrival to [facility] on 06/21/17, a CT A/P was performed which showed an incarcerated hernia. Of note, her CT showed massive splenomegaly at 32cm in greatest dimension. Manual reduction of her hernia was attempted while in the ED but was unsuccessful. Pre-operative notes state that following intubation manual reduction was performed successfully and hernia was repaired laparoscopically. CT CAP was performed as well which was without the presence of additional surrounding LAD. She did have flow cytometry performed during her admission as well which showed B-cell lymphocytes which were monoclonal.  A bone marrow biopsy was also examined which showed that it was markedly hypercellular (~95%) with prominent interstitial infiltrate of small lymphocytes.   Prior to her diagnosis, she mostly felt typically well overall. She states that she did suffer from insomnia which she related to over-thinking at the time regarding her familial situation. She did experience some fatigue and weight loss with this as well. Her partner reports that she noticed her weight loss approximately 83moago, which she quantifies at approximately 30lbs since onset. Towards the end of this six month periods and just prior to the appearance of her hernia the patient began to notice some bothersome abdominal distension. She also noticed some significant fatigue and she would nap for longer periods following her daily workouts. Additionally, her partner would notice that she would have issues with breathing and this appeared irregular and more labored, especially at night while laying down. She also mentions that she did experience intermittent night sweats throughout this six month period as well. She denies lightheadedness, dizziness, abdominal pain, back pain, fever, chills, abnormal bruising/bleeding, bloody stools, epistaxis, hematuria, gingival bleeding, or other bleeding issues within this period. She has not received any blood transfusions and has not previously had a tattoo; she is unconcerned with prior HepC exposure.   Since her hernia surgery, she reports that she feels great symptomatically overall and much improved. She does have some issues with abdominal bloating and discomfort, but this has been manageable. No overt abdominal pain.   Throughout all of this, she has attempted to try to remain within good spirits. Losing her sister recently has been hard, but she has been working  through it with her family; however, her partner voices that they do not have strong social support in this area as most of their support is in  [place] where they previously lived. She has been working through her sister's death with grief counseling which has been helping her.    She has never previously smoked, but she reports some moderate second-hand smoke through her father at a younger age. Her father did die of a PE, but she is unaware of any other diagnoses of clotting disorders or cancers within her family. No h/o alcohol abuse, chemical/radiation exposures. She worked previously in Industrial/product designer.   PREVIOUS TREATMENT:  Rituximab weekly x4 beginning on 07/21/17-08/11/17 Received additional Rituxan weekly x 4 doses Rituximab  Weekly for 4 weeks starting 10/05/17 - completed 10/2017.  CURRENT TREATMENT:  Planning for splenectomy at [facility] with Dr Shon Hough.   INTERIM HISTORY:   [name] returns today for scheduled follow up of her SMZL. The patient's last visit with [place] was on 04/16/18. She is accompanied today by her partner. The pt reports that she is doing well overall.   The pt reports that she has some remaining sensitivity in her abdomen from her previous splenectomy, but is looking to continually strengthen her core with walking and other exercises. She continues on Gabapentin BID.  The pt notes that her mouth sores continue to return while taking Acyclovir and Biotene mouthwashes. She also continues to take a Vitamin B complex.   The pt also notes that she has kept active and is walking about 2 miles each day.   Lab results today (06/18/18) of CBC w/diff, CMP, and Reticulocytes is as follows: all values are WNL except for WBC at 27.6k, RDW at 17.2, Lymphs abs at 21.8k, Monocytes abs at 1.2k, Basophils abs at 200, Glucose at 101.  On review of systems, pt reports expected weight gain, eating well, staying active, resolving fatigue, abdominal sensitivity at surgical site, occasional mouth sores, and denies fevers, chills, night sweats, skin rashes, concerns for infections, and any other symptoms.    MEDICAL HISTORY:  Past Medical History:  Diagnosis Date  . B-cell lymphoma ([place]) 06/2017     SURGICAL HISTORY: Past Surgical History:  Procedure Laterality Date  . left inguinal hernia repair Left   . SPLENECTOMY, TOTAL  02/13/2018    SOCIAL HISTORY: Social History   Socioeconomic History  . Marital status: Single    Spouse name: Not on file  . Number of children: Not on file  . Years of education: Not on file  . Highest education level: Not on file  Occupational History  . Not on file  Social Needs  . Financial resource strain: Not on file  . Food insecurity:    Worry: Not on file    Inability: Not on file  . Transportation needs:    Medical: Not on file    Non-medical: Not on file  Tobacco Use  . Smoking status: Never Smoker  . Smokeless tobacco: Never Used  Substance and Sexual Activity  . Alcohol use: Yes    Comment: glass of wine occasionally  . Drug use: No  . Sexual activity: Not on file  Lifestyle  . Physical activity:    Days per week: Not on file    Minutes per session: Not on file  . Stress: Not on file  Relationships  . Social connections:    Talks on phone: Not on file    Gets together: Not on  file    Attends religious service: Not on file    Active member of club or organization: Not on file    Attends meetings of clubs or organizations: Not on file    Relationship status: Not on file  . Intimate partner violence:    Fear of current or ex partner: Not on file    Emotionally abused: Not on file    Physically abused: Not on file    Forced sexual activity: Not on file  Other Topics Concern  . Not on file  Social History Narrative  . Not on file    FAMILY HISTORY: Sister - lung cancer Mother -Alzheimers dementia  ALLERGIES:  has No Known Allergies.  MEDICATIONS:  Current Outpatient Medications  Medication Sig Dispense Refill  . acetaminophen (TYLENOL) 500 MG tablet Take 500 mg by mouth every 8 (eight) hours as needed for moderate  pain.     Marland Kitchen acyclovir (ZOVIRAX) 200 MG capsule Take 2 capsules (400 mg total) by mouth 2 (two) times daily. 120 capsule 1  . chlorhexidine (PERIDEX) 0.12 % solution Use as directed 15 mLs in the mouth or throat 2 (two) times daily. Swish and spit. 473 mL 0  . docusate sodium (COLACE) 100 MG capsule Take 100 mg by mouth 2 (two) times daily.    Marland Kitchen gabapentin (NEURONTIN) 100 MG capsule 1 cap (15m) po twice daily (with breakfast and lunch) and 3 caps (3054m po at bedtime 150 capsule 1  . hydrOXYzine (ATARAX/VISTARIL) 25 MG tablet Take 1-2 tablets (25-50 mg total) by mouth every 4 (four) hours as needed. 20 tablet 0  . Loratadine 10 MG CAPS Take 10 mg by mouth daily.    . Marland KitchenxyCODONE-acetaminophen (PERCOCET/ROXICET) 5-325 MG tablet Take 1 tablet by mouth every 4 (four) hours as needed for severe pain.    . Marland Kitchenenna (SENOKOT) 8.6 MG TABS tablet Take 1 tablet by mouth daily as needed for mild constipation.     . triamcinolone cream (KENALOG) 0.1 % Apply 1 application topically 2 (two) times daily. 30 g 0   No current facility-administered medications for this visit.     REVIEW OF SYSTEMS:    A 10+ POINT REVIEW OF SYSTEMS WAS OBTAINED including neurology, dermatology, psychiatry, cardiac, respiratory, lymph, extremities, GI, GU, Musculoskeletal, constitutional, breasts, reproductive, HEENT.  All pertinent positives are noted in the HPI.  All others are negative.   PHYSICAL EXAMINATION:  ECOG PERFORMANCE STATUS: 1 .BP 113/79 (BP Location: Left Arm, Patient Position: Sitting)   Pulse 60   Temp 98.3 F (36.8 C) (Oral)   Resp 18   Ht _0  (1.676 m)   Wt 156 lb 1.6 oz (70.8 kg)   SpO2 99%   BMI 25.20 kg/m   GENERAL:alert, in no acute distress and comfortable SKIN: no acute rashes, no significant lesions EYES: conjunctiva are pink and non-injected, sclera anicteric OROPHARYNX: MMM, no exudates, no oropharyngeal erythema or ulceration NECK: supple, no JVD LYMPH:  no palpable lymphadenopathy in the  cervical, axillary or inguinal regions LUNGS: clear to auscultation b/l with normal respiratory effort HEART: regular rate & rhythm ABDOMEN:  normoactive bowel sounds , non tender, not distended. No palpable hepatosplenomegaly.  Extremity: no pedal edema PSYCH: alert & oriented x 3 with fluent speech NEURO: no focal motor/sensory deficits   LABORATORY DATA:   I have reviewed the data as listed   CBC Latest Ref Rng & Units 06/18/2018 04/16/2018 03/05/2018  WBC 3.9 - 10.3 K/uL 27.6(H) 22.9(H) 20.3(H)  Hemoglobin 11.6 -  15.9 g/dL 14.2 13.3 11.1(L)  Hematocrit 34.8 - 46.6 % 43.0 40.5 33.7(L)  Platelets 145 - 400 K/uL 323 430(H) 721(H)  ANC 4.1k . CBC    Component Value Date/Time   WBC 27.6 (H) 06/18/2018 0844   RBC 4.62 06/18/2018 0844   RBC 4.74 06/18/2018 0844   HGB 14.2 06/18/2018 0844   HGB 8.0 (L) 02/07/2018 0905   HGB 9.1 (L) 09/14/2017 0835   HCT 43.0 06/18/2018 0844   HCT 35.2 03/05/2018 1331   HCT 29.0 (L) 09/14/2017 0835   PLT 323 06/18/2018 0844   PLT 50 (L) 02/07/2018 0905   PLT 46 (L) 09/14/2017 0835   MCV 90.7 06/18/2018 0844   MCV 92.1 09/14/2017 0835   MCH 30.0 06/18/2018 0844   MCHC 33.0 06/18/2018 0844   RDW 17.2 (H) 06/18/2018 0844   RDW 18.2 (H) 09/14/2017 0835   LYMPHSABS 21.8 (H) 06/18/2018 0844   LYMPHSABS 2.0 09/14/2017 0835   MONOABS 1.2 (H) 06/18/2018 0844   MONOABS 0.3 09/14/2017 0835   EOSABS 0.4 06/18/2018 0844   EOSABS 0.0 09/14/2017 0835   BASOSABS 0.2 (H) 06/18/2018 0844   BASOSABS 0.0 09/14/2017 0835    . CMP Latest Ref Rng & Units 06/18/2018 04/16/2018 03/05/2018  Glucose 70 - 99 mg/dL 101(H) 81 96  BUN 8 - 23 mg/dL _0 Creatinine 0.44 - 1.00 mg/dL 0.87 0.81 0.75  Sodium 135 - 145 mmol/L 141 141 138  Potassium 3.5 - 5.1 mmol/L 4.6 4.5 4.3  Chloride 98 - 111 mmol/L 108 105 103  CO2 22 - 32 mmol/L _1 Calcium 8.9 - 10.3 mg/dL 8.9 9.8 9.9  Total Protein 6.5 - 8.1 g/dL 7.1 7.6 7.3  Total Bilirubin 0.3 - 1.2 mg/dL 0.9 0.9  0.5  Alkaline Phos 38 - 126 U/L 87 82 81  AST 15 - 41 U/L _2 ALT 0 - 44 U/L 29 34 25   Component     Latest Ref Rng & Units 07/13/2017  LDH     125 - 245 U/L 232  Hep C Virus Ab     0.0 - 0.9 s/co ratio <0.1  HIV Screen 4th Generation wRfx     Non Reactive Non Reactive  Hep B Core Ab, Tot     Negative Negative  Hepatitis B Surface Ag     Negative Negative   Component     Latest Ref Rng & Units 07/28/2017  LDH     125 - 245 U/L 200  Uric Acid, Serum     2.6 - 7.4 mg/dl 6.0  Phosphorus     2.5 - 4.5 mg/dL 3.8   03/05/18 Pathology Report Splenectomy:      RADIOGRAPHIC STUDIES: I have personally reviewed the radiological images as listed and agreed with the findings in the report. No results found.   ASSESSMENT & PLAN:   Michelle Cohen is a wonderful 67 y.o. 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  Her clinical presentation is also representative of this and that this may have been present over months to possibly years.  -With her pattern of  involvement and results thus far it is likely that she has splenic marginal zone type lymphoma.  -PET scan on 07/25/2017 with results showing: Massive splenomegaly, splenic volume 5770 cubic cm, with homogeneous low grade activity throughout the spleen equal to that of the physiologic activity in the liver, and above the background blood pool activity in the mediastinum. No pathologic adenopathy identified. No bony involvement noted.   -CT A/p on 08/23/17 with results of: Marked splenomegaly without substantial interval change in splenic size/volume.  Borderline lymphadenopathy in the hepatoduodenal ligament. Aortic Atherosclerosis. I discussed this with the patient in great detail.  -CT A/p on 09/15/17 shows significant splenomegaly shows minimal smaller change in size, with no evidence of bowel obstruction.   PLAN:  -would expect chronic leucocytosis and thrombocytosis to some extent from post splenectomy.  -Recommend continuing reasonable infection prevention precautions -Continue courses of Gabapentin and Protonix as suggested with post-op -Discussed pt labwork today, 06/18/18; HGB normalized to 14.2, PLT normalized to 323k. Lymphs abs at 21.8k -Discussed that lymphocyte counts are gradually increasing without any constitutional symptoms or affecting other blood counts -Discussed that there is no overt indication to resume treatment at this time, especially given her previous difficulty tolerating Rituxan  -Discussed that pt is fine to receive component shingles vaccine but not the live attenuated shingles vaccine -Provided the annual flu vaccine today -Recommended salt and baking soda mouthwashes 4-5 times each day for moth sores -Recommended that the pt continue to eat well, drink at least 48-64 oz of water each day, and walk 20-30 minutes each day.   -no indication for additional treatment of patients SMZL at this time. -Will see the pt back in 2 months   2. Pancytopenia with leukocytosis and anemia  Due to SMZL and hypersplenism Now resolved  PLAN: - completed pre-splenectomy vaccinations. -pancytopenia resolved post-splenectomy  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 Had to complete Rituxan at lower rate. Patient would not be a candidate for rapid infusion protocol and would need to keep the next infusion at the tolerated rate and not rate escalate. Plan  Completed with extensive pre-medications.  4.  Oral mucositis/HSV outbreak - resolved -continue with acyclovir for prophylaxis   -flu shot today -RTC with Dr Irene Limbo in 2 months with labs   The total time spent in the appt was 25 minutes and more than 50% was on counseling and direct patient cares.   Sullivan Lone MD MS AAHIVMS [facility] Hematology/Oncology Physician [facility]  (Office):       [phone] (Work cell):  [phone] (Fax):           [phone]  I, Baldwin Jamaica, am acting as a scribe for Dr. Irene Limbo  .I have reviewed the above documentation for accuracy and completeness, and I agree with the above. Brunetta Genera MD

## 2018-06-17 MED FILL — ACYCLOVIR 200 MG CAP: 200 | 30 days supply | Qty: 120 | Fill #1

## 2018-06-17 MED FILL — GABAPENTIN 100 MG CAPSULE: 100 | 30 days supply | Qty: 150 | Fill #1

## 2018-06-18 ENCOUNTER — Encounter: Payer: Self-pay | Admitting: Hematology

## 2018-06-18 ENCOUNTER — Telehealth: Payer: Self-pay | Admitting: Hematology

## 2018-06-18 ENCOUNTER — Inpatient Hospital Stay: Payer: Self-pay | Attending: Hematology | Admitting: Hematology

## 2018-06-18 ENCOUNTER — Inpatient Hospital Stay: Payer: Self-pay

## 2018-06-18 VITALS — BP 113/79 | HR 60 | Temp 98.3°F | Resp 18 | Ht 66.0 in | Wt 156.1 lb

## 2018-06-18 DIAGNOSIS — Z9081 Acquired absence of spleen: Secondary | ICD-10-CM

## 2018-06-18 DIAGNOSIS — C8307 Small cell B-cell lymphoma, spleen: Secondary | ICD-10-CM

## 2018-06-18 DIAGNOSIS — I700 Atherosclerosis of aorta: Secondary | ICD-10-CM

## 2018-06-18 DIAGNOSIS — G4700 Insomnia, unspecified: Secondary | ICD-10-CM

## 2018-06-18 DIAGNOSIS — D72829 Elevated white blood cell count, unspecified: Secondary | ICD-10-CM

## 2018-06-18 DIAGNOSIS — D75838 Other thrombocytosis: Secondary | ICD-10-CM

## 2018-06-18 DIAGNOSIS — Z23 Encounter for immunization: Secondary | ICD-10-CM

## 2018-06-18 DIAGNOSIS — R7989 Other specified abnormal findings of blood chemistry: Secondary | ICD-10-CM

## 2018-06-18 LAB — CMP (CANCER CENTER ONLY)
ALBUMIN: 3.7 g/dL (ref 3.5–5.0)
ALT: 29 U/L (ref 0–44)
AST: 21 U/L (ref 15–41)
Alkaline Phosphatase: 87 U/L (ref 38–126)
Anion gap: 10 (ref 5–15)
BILIRUBIN TOTAL: 0.9 mg/dL (ref 0.3–1.2)
BUN: 20 mg/dL (ref 8–23)
CHLORIDE: 108 mmol/L (ref 98–111)
CO2: 23 mmol/L (ref 22–32)
Calcium: 8.9 mg/dL (ref 8.9–10.3)
Creatinine: 0.87 mg/dL (ref 0.44–1.00)
GFR, Est AFR Am: >60 mL/min (ref >60)
GFR, Estimated: >60 mL/min (ref >60)
GLUCOSE: 101 mg/dL — AB (ref 70–99)
Potassium: 4.6 mmol/L (ref 3.5–5.1)
Sodium: 141 mmol/L (ref 135–145)
Total Protein: 7.1 g/dL (ref 6.5–8.1)

## 2018-06-18 LAB — RETICULOCYTES
RBC.: 4.62 MIL/uL (ref 3.70–5.45)
RETIC COUNT ABSOLUTE: 50.8 10*3/uL (ref 33.7–90.7)
Retic Ct Pct: 1.1 % (ref 0.7–2.1)

## 2018-06-18 LAB — CBC WITH DIFFERENTIAL/PLATELET
Basophils Absolute: 0.2 10*3/uL — ABNORMAL HIGH (ref 0.0–0.1)
Basophils Relative: 1 %
Eosinophils Absolute: 0.4 10*3/uL (ref 0.0–0.5)
Eosinophils Relative: 1 %
HEMATOCRIT: 43 % (ref 34.8–46.6)
Hemoglobin: 14.2 g/dL (ref 11.6–15.9)
LYMPHS PCT: 79 %
Lymphs Abs: 21.8 10*3/uL — ABNORMAL HIGH (ref 0.9–3.3)
MCH: 30 pg (ref 25.1–34.0)
MCHC: 33 g/dL (ref 31.5–36.0)
MCV: 90.7 fL (ref 79.5–101.0)
MONOS PCT: 4 %
Monocytes Absolute: 1.2 10*3/uL — ABNORMAL HIGH (ref 0.1–0.9)
Neutro Abs: 4.1 10*3/uL (ref 1.5–6.5)
Neutrophils Relative %: 15 %
Platelets: 323 10*3/uL (ref 145–400)
RBC: 4.74 MIL/uL (ref 3.70–5.45)
RDW: 17.2 % — AB (ref 11.2–14.5)
WBC: 27.6 10*3/uL — ABNORMAL HIGH (ref 3.9–10.3)

## 2018-06-18 MED ORDER — INFLUENZA VAC SPLIT QUAD 0.5 ML IM SUSY
PREFILLED_SYRINGE | INTRAMUSCULAR | Status: AC
  Filled 2018-06-18: qty 0.5

## 2018-06-18 MED ORDER — INFLUENZA VAC SPLIT QUAD 0.5 ML IM SUSY
0.5000 mL | PREFILLED_SYRINGE | Freq: Once | INTRAMUSCULAR | Status: AC
  Administered 2018-06-18: 0.5 mL via INTRAMUSCULAR

## 2018-06-18 NOTE — Patient Instructions (Addendum)
You are okay to receive the Shingles component vaccine called Shingrix  Do not take the live attenuated vaccine

## 2018-06-18 NOTE — Telephone Encounter (Signed)
Appts scheduled avs/calendar printed per 9/23 los °

## 2018-07-12 ENCOUNTER — Other Ambulatory Visit: Payer: Self-pay | Admitting: Hematology

## 2018-07-13 MED FILL — CHLORHEXIDINE 0.12% RINSE: 0.12 | 16 days supply | Qty: 473 | Fill #0

## 2018-07-23 MED FILL — ACYCLOVIR 200 MG CAP: 200 | 30 days supply | Qty: 120 | Fill #1

## 2018-07-24 ENCOUNTER — Telehealth: Payer: Self-pay | Admitting: *Deleted

## 2018-07-24 ENCOUNTER — Other Ambulatory Visit: Payer: Self-pay | Admitting: *Deleted

## 2018-07-24 MED ORDER — MAGIC MOUTHWASH W/LIDOCAINE: 5.0000 mL | Freq: Four times a day (QID) | ORAL | 0 refills | Status: DC | PRN

## 2018-07-24 MED FILL — MAGIC MW LIDO/MYL/NYS 1:1:1: 6 days supply | Qty: 120 | Fill #0

## 2018-07-24 NOTE — Telephone Encounter (Signed)
Pt called to request refill for MMW.  Ok per Dr. Irene Limbo.  Rx faxed to [facility] outpatient pharmacy.  Attempt to call pt to inform rx has been sent, no answer, no vm available.

## 2018-08-08 ENCOUNTER — Other Ambulatory Visit: Payer: Self-pay | Admitting: Hematology

## 2018-08-08 MED FILL — CHLORHEXIDINE 0.12% RINSE: 0.12 | 16 days supply | Qty: 473 | Fill #0

## 2018-08-16 ENCOUNTER — Other Ambulatory Visit: Payer: Self-pay

## 2018-08-16 ENCOUNTER — Ambulatory Visit: Payer: Self-pay | Admitting: Hematology

## 2018-08-27 ENCOUNTER — Other Ambulatory Visit: Payer: Self-pay | Admitting: Hematology

## 2018-08-27 MED FILL — ACYCLOVIR 200 MG CAP: 200 | 30 days supply | Qty: 120 | Fill #0

## 2018-08-31 NOTE — Progress Notes (Signed)
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 female who has been referred to [place] from [facility] for evaluation and management of B-cell lymphoma. She presents to her appointment today with her partner of 21 years. She states that she moved to the area ~2 years ago secondary to her sister's diagnosis of lung cancer (unfortunately, now passed) and taking care of her mother's Alzheimer's. Generally, prior to this issue she has been very healthy and without chronic medical conditions and not on chronic medical therapies/treatments.   Initially, the patient was admitted to [facility] for abdominal pain and significant hernia. She reports that she was visiting in [place] and while flying there she had an acute onset of sweats and left lower quadrant pain. She reports that her hernia was not present at that time, but as she continued to fly her hernia had become more noticeable and enlarged in her lower abdomen. Additionally, her partner reports that her episode of hydrosis was very significant, stating that it was pooling below her. On their arrival to [facility] on [facility]21/17, a CT A/P was performed which showed an incarcerated hernia. Of note, her CT showed massive splenomegaly at 32cm in greatest dimension. Manual reduction of her hernia was attempted while in the ED but was unsuccessful. Pre-operative notes state that following intubation manual reduction was performed successfully and hernia was repaired laparoscopically. CT CAP was performed as well which was without the presence of additional surrounding LAD. She did have flow cytometry performed during her admission as well which showed B-cell lymphocytes which were monoclonal.  A bone marrow biopsy was also examined which showed that it was markedly hypercellular (~95%) with prominent interstitial infiltrate of small lymphocytes.   Prior to her diagnosis, she mostly felt typically well overall. She states that she did suffer from insomnia which she related to over-thinking at the time regarding her familial situation. She did experience some fatigue and weight loss with this as well. Her partner reports that she noticed her weight loss approximately 35moago, which she quantifies at approximately 30lbs since onset. Towards the end of this six month periods and just prior to the appearance of her hernia the patient began to notice some bothersome abdominal distension. She also noticed some significant fatigue and she would nap for longer periods following her daily workouts. Additionally, her partner would notice that she would have issues with breathing and this appeared irregular and more labored, especially at night while laying down. She also mentions that she did experience intermittent night sweats throughout this six month period as well. She denies lightheadedness, dizziness, abdominal pain, back pain, fever, chills, abnormal bruising/bleeding, bloody stools, epistaxis, hematuria, gingival bleeding, or other bleeding issues within this period. She has not received any blood transfusions and has not previously had a tattoo; she is unconcerned with prior HepC exposure.   Since her hernia surgery, she reports that she feels great symptomatically overall and much improved. She does have some issues with abdominal bloating and discomfort, but this has been manageable. No overt abdominal pain.   Throughout all of this, she has attempted to try to remain within good spirits. Losing her sister recently has been hard, but she has been working  through it with her family; however, her partner voices that they do not have strong social support in this area as most of their support is in  [place] where they previously lived. She has been working through her sister's death with grief counseling which has been helping her.    She has never previously smoked, but she reports some moderate second-hand smoke through her father at a younger age. Her father did die of a PE, but she is unaware of any other diagnoses of clotting disorders or cancers within her family. No h/o alcohol abuse, chemical/radiation exposures. She worked previously in Industrial/product designer.   PREVIOUS TREATMENT:  Rituximab weekly x4 beginning on 07/21/17-08/11/17 Received additional Rituxan weekly x 4 doses Rituximab  Weekly for 4 weeks starting 10/05/17 - completed 10/2017.  CURRENT TREATMENT:  S/p splenectomy at [facility] with Dr Shon Hough.  INTERIM HISTORY:   Michelle Cohen returns today for scheduled follow up of her SMZL. The patient's last visit with [place] was on 06/18/18. She is accompanied today by her partner. The pt reports that she is doing well overall.   The pt reports that some days she feels "okay, but some days she feels tired," and notes that her energy levels are overall improved. She notes that she is now having night sweats around her neck and shoulders, though not as bad as she was having these upon initial diagnosis, and denies drenching, and denies feeling overtly warm. She denies any fevers or chills.   She also endorses intermittent mouth sores inside her upper and lower lips and has continued on Vitamin B complex and Acyclovir. She uses Zovirax cream for external mouth sores.   The pt notes that she has been eating well, though her appetite fluctuates. She also notes that she has been going to the gym every day.   She notes sensitivity to her previous surgical incisions but denies any overt pain. She has decreased on Gabapentin to 167m BID.   Lab results today (09/03/18) of CBC w/diff and CMP is as follows: all values are WNL except for WBC at 45.2k, RDW at 15.6, Lymphs abs at  36.6k, Monocytes abs at 1.8k, Eosinophils abs at 1.8k, Glucose at 102. 09/03/18 LDH is pending  On review of systems, pt reports staying very active, some night sweats, continued intermittent mouth sores, improved energy levels overall, weight gain, eating well, and denies abdominal pains, fevers, chills, back pain, pain along the spine, and any other symptoms.   MEDICAL HISTORY:  Past Medical History:  Diagnosis Date  . B-cell lymphoma ([place] 06/2017     SURGICAL HISTORY: Past Surgical History:  Procedure Laterality Date  . left inguinal hernia repair Left   . SPLENECTOMY, TOTAL  02/13/2018    SOCIAL HISTORY: Social History   Socioeconomic History  . Marital status: Single    Spouse name: Not on file  . Number of children: Not on file  . Years of education: Not on file  . Highest education level: Not on file  Occupational History  . Not on file  Social Needs  . Financial resource strain: Not on file  . Food insecurity:    Worry: Not on file    Inability: Not on file  . Transportation needs:    Medical: Not on file    Non-medical: Not on file  Tobacco Use  . Smoking status: Never Smoker  . Smokeless tobacco: Never Used  Substance and Sexual Activity  . Alcohol use: Yes  Comment: glass of wine occasionally  . Drug use: No  . Sexual activity: Not on file  Lifestyle  . Physical activity:    Days per week: Not on file    Minutes per session: Not on file  . Stress: Not on file  Relationships  . Social connections:    Talks on phone: Not on file    Gets together: Not on file    Attends religious service: Not on file    Active member of club or organization: Not on file    Attends meetings of clubs or organizations: Not on file    Relationship status: Not on file  . Intimate partner violence:    Fear of current or ex partner: Not on file    Emotionally abused: Not on file    Physically abused: Not on file    Forced sexual activity: Not on file  Other Topics  Concern  . Not on file  Social History Narrative  . Not on file    FAMILY HISTORY: Sister - lung cancer Mother -Alzheimers dementia  ALLERGIES:  has No Known Allergies.  MEDICATIONS:  Current Outpatient Medications  Medication Sig Dispense Refill  . acetaminophen (TYLENOL) 500 MG tablet Take 500 mg by mouth every 8 (eight) hours as needed for moderate pain.     Marland Kitchen acyclovir (ZOVIRAX) 200 MG capsule Take 2 capsules (400 mg total) by mouth 2 (two) times daily. 120 capsule 1  . acyclovir (ZOVIRAX) 200 MG capsule TAKE 2 CAPSULES BY MOUTH 2 TIMES DAILY. 120 capsule 1  . chlorhexidine (PERIDEX) 0.12 % solution SWISH WITH 15 MLS IN MOUTH OR THROAT AND SPIT TWICE DAILY AS DIRECTED 473 mL 0  . docusate sodium (COLACE) 100 MG capsule Take 100 mg by mouth 2 (two) times daily.    Marland Kitchen gabapentin (NEURONTIN) 100 MG capsule 1 cap (126m) po twice daily (with breakfast and lunch) and 3 caps (3041m po at bedtime 150 capsule 1  . hydrOXYzine (ATARAX/VISTARIL) 25 MG tablet Take 1-2 tablets (25-50 mg total) by mouth every 4 (four) hours as needed. 20 tablet 0  . Loratadine 10 MG CAPS Take 10 mg by mouth daily.    . magic mouthwash w/lidocaine SOLN Take 5 mLs by mouth 4 (four) times daily as needed for mouth pain. Swish and spit 1 Part viscous lidocaine 2% 1 Part Maalox (do not substitute Kaopectate) 1 Part diphenhydramine 12.5 mg per 5 ml elixir. 120 mL 2  . oxyCODONE-acetaminophen (PERCOCET/ROXICET) 5-325 MG tablet Take 1 tablet by mouth every 4 (four) hours as needed for severe pain.    . Marland Kitchenenna (SENOKOT) 8.6 MG TABS tablet Take 1 tablet by mouth daily as needed for mild constipation.     . triamcinolone cream (KENALOG) 0.1 % Apply 1 application topically 2 (two) times daily. 30 g 0   No current facility-administered medications for this visit.     REVIEW OF SYSTEMS:    A 10+ POINT REVIEW OF SYSTEMS WAS OBTAINED including neurology, dermatology, psychiatry, cardiac, respiratory, lymph, extremities,  GI, GU, Musculoskeletal, constitutional, breasts, reproductive, HEENT.  All pertinent positives are noted in the HPI.  All others are negative.    PHYSICAL EXAMINATION:  ECOG PERFORMANCE STATUS: 1 .BP 123/78 (BP Location: Left Arm, Patient Position: Sitting)   Pulse 62   Temp 98.2 F (36.8 C) (Oral)   Resp 18   Ht '5\' 6"'  (1.676 m)   Wt 165 lb 8 oz (75.1 kg)   SpO2 98%   BMI 26.71  kg/m   GENERAL:alert, in no acute distress and comfortable SKIN: no acute rashes, no significant lesions EYES: conjunctiva are pink and non-injected, sclera anicteric OROPHARYNX: MMM, no exudates, no oropharyngeal erythema or ulceration NECK: supple, no JVD LYMPH:  no palpable lymphadenopathy in the cervical, axillary or inguinal regions LUNGS: clear to auscultation b/l with normal respiratory effort HEART: regular rate & rhythm ABDOMEN:  normoactive bowel sounds , non tender, not distended. No palpable hepatosplenomegaly.  Extremity: no pedal edema PSYCH: alert & oriented x 3 with fluent speech NEURO: no focal motor/sensory deficits   LABORATORY DATA:   I have reviewed the data as listed   CBC Latest Ref Rng & Units 09/03/2018 06/18/2018 04/16/2018  WBC 4.0 - 10.5 K/uL 45.2(H) 27.6(H) 22.9(H)  Hemoglobin 12.0 - 15.0 g/dL 13.7 14.2 13.3  Hematocrit 36.0 - 46.0 % 42.4 43.0 40.5  Platelets 150 - 400 K/uL 347 323 430(H)  ANC 4.1k . CBC    Component Value Date/Time   WBC 45.2 (H) 09/03/2018 0824   RBC 4.46 09/03/2018 0824   HGB 13.7 09/03/2018 0824   HGB 8.0 (L) 02/07/2018 0905   HGB 9.1 (L) 09/14/2017 0835   HCT 42.4 09/03/2018 0824   HCT 35.2 03/05/2018 1331   HCT 29.0 (L) 09/14/2017 0835   PLT 347 09/03/2018 0824   PLT 50 (L) 02/07/2018 0905   PLT 46 (L) 09/14/2017 0835   MCV 95.1 09/03/2018 0824   MCV 92.1 09/14/2017 0835   MCH 30.7 09/03/2018 0824   MCHC 32.3 09/03/2018 0824   RDW 15.6 (H) 09/03/2018 0824   RDW 18.2 (H) 09/14/2017 0835   LYMPHSABS 36.6 (H) 09/03/2018 0824   LYMPHSABS  2.0 09/14/2017 0835   MONOABS 1.8 (H) 09/03/2018 0824   MONOABS 0.3 09/14/2017 0835   EOSABS 1.8 (H) 09/03/2018 0824   EOSABS 0.0 09/14/2017 0835   BASOSABS 0.0 09/03/2018 0824   BASOSABS 0.0 09/14/2017 0835    . CMP Latest Ref Rng & Units 09/03/2018 06/18/2018 04/16/2018  Glucose 70 - 99 mg/dL 102(H) 101(H) 81  BUN 8 - 23 mg/dL '13 20 18  ' Creatinine 0.44 - 1.00 mg/dL 0.80 0.87 0.81  Sodium 135 - 145 mmol/L 141 141 141  Potassium 3.5 - 5.1 mmol/L 4.7 4.6 4.5  Chloride 98 - 111 mmol/L 107 108 105  CO2 22 - 32 mmol/L '25 23 26  ' Calcium 8.9 - 10.3 mg/dL 9.3 8.9 9.8  Total Protein 6.5 - 8.1 g/dL 7.0 7.1 7.6  Total Bilirubin 0.3 - 1.2 mg/dL 1.1 0.9 0.9  Alkaline Phos 38 - 126 U/L 83 87 82  AST 15 - 41 U/L '24 21 25  ' ALT 0 - 44 U/L 34 29 34   Component     Latest Ref Rng & Units 07/13/2017  LDH     125 - 245 U/L 232  Hep C Virus Ab     0.0 - 0.9 s/co ratio <0.1  HIV Screen 4th Generation wRfx     Non Reactive Non Reactive  Hep B Core Ab, Tot     Negative Negative  Hepatitis B Surface Ag     Negative Negative   Component     Latest Ref Rng & Units 07/28/2017  LDH     125 - 245 U/L 200  Uric Acid, Serum     2.6 - 7.4 mg/dl 6.0  Phosphorus     2.5 - 4.5 mg/dL 3.8   03/05/18 Pathology Report Splenectomy:      RADIOGRAPHIC STUDIES: I  have personally reviewed the radiological images as listed and agreed with the findings in the report. No results found.   ASSESSMENT & PLAN:   Aleli Navedo is a wonderful 67 y.o. 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  Her clinical presentation is also  representative of this and that this may have been present over months to possibly years.  -With her pattern of involvement and results thus far it is likely that she has splenic marginal zone type lymphoma.  -PET scan on 07/25/2017 with results showing: Massive splenomegaly, splenic volume 5770 cubic cm, with homogeneous low grade activity throughout the spleen equal to that of the physiologic activity in the liver, and above the background blood pool activity in the mediastinum. No pathologic adenopathy identified. No bony involvement noted.   -CT A/p on 08/23/17 with results of: Marked splenomegaly without substantial interval change in splenic size/volume. Borderline lymphadenopathy in the hepatoduodenal ligament. Aortic Atherosclerosis. I discussed this with the patient in great detail.  -CT A/p on 09/15/17 shows significant splenomegaly shows minimal smaller change in size, with no evidence of bowel obstruction.   PLAN:  -Discussed pt labwork today, 09/03/18; WBC increased to 45.2k from 27.6k two months ago, Lymphs abs at 36.6k, no anemia with HGB at 13.7, PLT stable at 347k -Will continue to watch WBC, which is expected to be marginally higher in this patient after splenectomy  -Other blood counts not affected by lymphocytosis, some mild non-drenching night sweats, but no significant constitutional symptoms -No indication for treatment of SMZL at this time  -Increase Acyclovir to 454m po TID  For reccurent HSV labialis -Okay to stop Gabapentin at this time as post-surgical pain has resolved  -Recommend continuing reasonable infection prevention precautions -Discussed that there is no overt indication to resume treatment at this time, especially given her previous difficulty tolerating Rituxan  -Will see the pt back in 2 months, sooner if any new concerns   2. Pancytopenia with leukocytosis and anemia  Due to SMZL and hypersplenism Now resolved  PLAN: - completed pre-splenectomy  vaccinations. -pancytopenia resolved post-splenectomy  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 Had to complete Rituxan at lower rate. Patient would not be a candidate for rapid infusion protocol and would need to keep the next infusion at the tolerated rate and not rate escalate. Plan  Completed with extensive pre-medications.  4. Oral mucositis/HSV outbreak - resolved -continue with acyclovir for prophylaxis   RTC with Dr KIrene Limbowith labs in 2 months    The total time spent in the appt was 30 minutes and more than 50% was on counseling and direct patient cares.   GSullivan LoneMD MKohls RanchAAHIVMS SHouston Methodist HosptialCChi [facility] Hospital-GeorgiaHematology/Oncology Physician [facility]Wal[facility] (Office):       [phone](Work cell):  [phone](Fax):           [phone] I, SBaldwin Jamaica am acting as a scribe for Dr. GSullivan Lone   .I have reviewed the above documentation for accuracy and completeness, and I agree with the above. .Cloria Spring  Irene Limbo MD

## 2018-09-03 ENCOUNTER — Inpatient Hospital Stay (HOSPITAL_BASED_OUTPATIENT_CLINIC_OR_DEPARTMENT_OTHER): Payer: Medicare Other | Admitting: Hematology

## 2018-09-03 ENCOUNTER — Telehealth: Payer: Self-pay | Admitting: Hematology

## 2018-09-03 ENCOUNTER — Inpatient Hospital Stay: Payer: Medicare Other | Attending: Hematology

## 2018-09-03 VITALS — BP 123/78 | HR 62 | Temp 98.2°F | Resp 18 | Ht 66.0 in | Wt 165.5 lb

## 2018-09-03 DIAGNOSIS — Z79899 Other long term (current) drug therapy: Secondary | ICD-10-CM | POA: Diagnosis not present

## 2018-09-03 DIAGNOSIS — G4700 Insomnia, unspecified: Secondary | ICD-10-CM | POA: Diagnosis not present

## 2018-09-03 DIAGNOSIS — R634 Abnormal weight loss: Secondary | ICD-10-CM | POA: Insufficient documentation

## 2018-09-03 DIAGNOSIS — R109 Unspecified abdominal pain: Secondary | ICD-10-CM

## 2018-09-03 DIAGNOSIS — R5383 Other fatigue: Secondary | ICD-10-CM | POA: Insufficient documentation

## 2018-09-03 DIAGNOSIS — D72829 Elevated white blood cell count, unspecified: Secondary | ICD-10-CM

## 2018-09-03 DIAGNOSIS — C8307 Small cell B-cell lymphoma, spleen: Secondary | ICD-10-CM | POA: Diagnosis not present

## 2018-09-03 DIAGNOSIS — B009 Herpesviral infection, unspecified: Secondary | ICD-10-CM

## 2018-09-03 LAB — CBC WITH DIFFERENTIAL/PLATELET
Band Neutrophils: 0 %
Basophils Absolute: 0 10*3/uL (ref 0.0–0.1)
Basophils Relative: 0 %
Blasts: 0 %
Eosinophils Absolute: 1.8 10*3/uL — ABNORMAL HIGH (ref 0.0–0.5)
Eosinophils Relative: 4 %
HCT: 42.4 % (ref 36.0–46.0)
Hemoglobin: 13.7 g/dL (ref 12.0–15.0)
Lymphocytes Relative: 81 %
Lymphs Abs: 36.6 10*3/uL — ABNORMAL HIGH (ref 0.7–4.0)
MCH: 30.7 pg (ref 26.0–34.0)
MCHC: 32.3 g/dL (ref 30.0–36.0)
MCV: 95.1 fL (ref 80.0–100.0)
Metamyelocytes Relative: 0 %
Monocytes Absolute: 1.8 10*3/uL — ABNORMAL HIGH (ref 0.1–1.0)
Monocytes Relative: 4 %
Myelocytes: 0 %
NEUTROS PCT: 11 %
NRBC: 0 /100{WBCs}
Neutro Abs: 5 10*3/uL (ref 1.7–17.7)
Other: 0 %
Platelets: 347 10*3/uL (ref 150–400)
Promyelocytes Relative: 0 %
RBC: 4.46 MIL/uL (ref 3.87–5.11)
RDW: 15.6 % — ABNORMAL HIGH (ref 11.5–15.5)
WBC: 45.2 10*3/uL — AB (ref 4.0–10.5)
nRBC: 0 % (ref 0.0–0.2)

## 2018-09-03 LAB — CMP (CANCER CENTER ONLY)
ALT: 34 U/L (ref 0–44)
ANION GAP: 9 (ref 5–15)
AST: 24 U/L (ref 15–41)
Albumin: 3.7 g/dL (ref 3.5–5.0)
Alkaline Phosphatase: 83 U/L (ref 38–126)
BUN: 13 mg/dL (ref 8–23)
CO2: 25 mmol/L (ref 22–32)
Calcium: 9.3 mg/dL (ref 8.9–10.3)
Chloride: 107 mmol/L (ref 98–111)
Creatinine: 0.8 mg/dL (ref 0.44–1.00)
GFR, Est AFR Am: >60 mL/min (ref >60)
Glucose, Bld: 102 mg/dL — ABNORMAL HIGH (ref 70–99)
Potassium: 4.7 mmol/L (ref 3.5–5.1)
Sodium: 141 mmol/L (ref 135–145)
Total Bilirubin: 1.1 mg/dL (ref 0.3–1.2)
Total Protein: 7 g/dL (ref 6.5–8.1)

## 2018-09-03 LAB — LACTATE DEHYDROGENASE: LDH: 196 U/L — ABNORMAL HIGH (ref 98–192)

## 2018-09-03 MED ORDER — MAGIC MOUTHWASH W/LIDOCAINE: 5.0000 mL | Freq: Four times a day (QID) | ORAL | 2 refills | Status: DC | PRN

## 2018-09-03 MED ORDER — ACYCLOVIR 200 MG PO CAPS: ORAL_CAPSULE | ORAL | 1 refills | Status: DC

## 2018-09-03 MED FILL — MAGIC MW (LID/NYS/MAA/BEN): 6 days supply | Qty: 120 | Fill #0

## 2018-09-03 MED FILL — ACYCLOVIR 200 MG CAP: 200 | 30 days supply | Qty: 120 | Fill #0

## 2018-09-03 NOTE — Telephone Encounter (Signed)
Printed calendar and avs. °

## 2018-10-09 ENCOUNTER — Ambulatory Visit (INDEPENDENT_AMBULATORY_CARE_PROVIDER_SITE_OTHER): Payer: Medicare Other

## 2018-10-09 ENCOUNTER — Encounter (HOSPITAL_COMMUNITY): Payer: Self-pay | Admitting: Emergency Medicine

## 2018-10-09 ENCOUNTER — Ambulatory Visit (HOSPITAL_COMMUNITY)
Admission: EM | Admit: 2018-10-09 | Discharge: 2018-10-09 | Disposition: A | Discharge status: Home / Self Care | Payer: Medicare Other | Attending: Emergency Medicine | Admitting: Emergency Medicine

## 2018-10-09 DIAGNOSIS — B9789 Other viral agents as the cause of diseases classified elsewhere: Secondary | ICD-10-CM | POA: Insufficient documentation

## 2018-10-09 DIAGNOSIS — J069 Acute upper respiratory infection, unspecified: Secondary | ICD-10-CM

## 2018-10-09 DIAGNOSIS — R05 Cough: Secondary | ICD-10-CM | POA: Diagnosis not present

## 2018-10-09 MED ORDER — BENZONATATE 200 MG PO CAPS: 200.0000 mg | ORAL_CAPSULE | Freq: Three times a day (TID) | ORAL | 0 refills | Status: AC | PRN

## 2018-10-09 MED ORDER — HYDROCOD POLST-CPM POLST ER 10-8 MG/5ML PO SUER: 5.0000 mL | Freq: Two times a day (BID) | ORAL | 0 refills | Status: AC | PRN

## 2018-10-09 MED ORDER — AEROCHAMBER PLUS MISC: 2 refills | Status: AC

## 2018-10-09 MED ORDER — FLUTICASONE PROPIONATE 50 MCG/ACT NA SUSP: 2.0000 | Freq: Every day | NASAL | 0 refills | Status: AC

## 2018-10-09 MED ORDER — ALBUTEROL SULFATE HFA 108 (90 BASE) MCG/ACT IN AERS: 1.0000 | INHALATION_SPRAY | Freq: Four times a day (QID) | RESPIRATORY_TRACT | 0 refills | Status: AC | PRN

## 2018-10-09 MED FILL — FLUTICASONE PROP 50 MCG SPR: 50 | 30 days supply | Qty: 16 | Fill #0

## 2018-10-09 MED FILL — HYDROCODONE-CHLORPHEN ER SU: 10-8 | 6 days supply | Qty: 60 | Fill #0

## 2018-10-09 MED FILL — VENTOLIN HFA 90 MCG INHALER: 108 (90 BAS | 25 days supply | Qty: 18 | Fill #0

## 2018-10-09 MED FILL — AEROCHAMBER: 1 days supply | Qty: 1 | Fill #0

## 2018-10-09 MED FILL — BENZONATATE 200 MG CAPS: 200 | 10 days supply | Qty: 30 | Fill #0

## 2018-10-09 NOTE — Discharge Instructions (Addendum)
Your chest x-ray is negative for pneumonia today.  Start Mucinex-D to keep the mucous thin and to decongest you.  You may take 400-600 mg of motrin with 1 gram of tylenol up to 3-4 times a day as needed for pain. This is an effective combination for pain.   Use a NeilMed sinus rinse as often as you want to to reduce nasal congestion. Follow the directions on the box.  Flonase will help with the nasal congestion as well.  Tessalon for the cough during the day, Tessalon for the cough at night.  You can also try 2 puffs from your albuterol inhaler using your spacer every 4-6 hours as needed.  Go to www.goodrx.com to look up your medications. This will give you a list of where you can find your prescriptions at the most affordable prices. Or you can ask the pharmacist what the cash price is. This is frequently cheaper than going through insurance.    Below is a list of primary care practices who are taking new patients for you to follow-up with. [facility] [place], [place] [address] [phone]  [facility] [phone] [place] [address]  [facility]: [place]  [phone]  [facility]: [facility] [place]   [phone]  [facility]: [address] [place]  [phone]  [facility] : [address] [place] [phone]  [facility]: [address] [place] [id number] [phone]  [facility]: [place] [phone]  Dr. Blanchie Serve [place]  [phone]  Dr. Benito Mccreedy, [facility]. [place], [place] [address]  [phone]  Go to www.goodrx.com to look up your medications. This will give you a list of where you can find your prescriptions at the most affordable prices. Or ask the pharmacist what the cash price is, or if they have any other discount programs available to help make your medication more affordable. This can be less expensive than what you would pay with insurance.

## 2018-10-09 NOTE — ED Provider Notes (Signed)
HPI  SUBJECTIVE:  Michelle Cohen is a 68 y.o. female who presents with mild nasal congestion, rhinorrhea, sore, scratchy throat, nonproductive cough, intermittent frontal sinus headaches/sinus pain and pressure for the past 3 or 4 days.  She states that she is unable to sleep at night secondary to the cough.  No wheezing, chest pain, shortness of breath.  No abdominal pain, urinary complaints.  No neck stiffness, body aches.  No antibiotics in the past month.  No antipyretic in the past 4-6 hours.  She has tried Tylenol without improvement in her symptoms.  No aggravating or alleviating factors.  She has a past medical history of B-cell lymphoma status post splenectomy.  She is currently receiving DMARD therapy.  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  No history of asthma, emphysema, COPD, smoking, diabetes, hypertension, frequent sinus infections.  PMD: None.   Past Medical History:  Diagnosis Date  . B-cell lymphoma ([place]) 06/2017    Past Surgical History:  Procedure Laterality Date  . left inguinal hernia repair Left   . SPLENECTOMY, TOTAL  02/13/2018    Family History  Problem Relation Age of Onset  . Lung cancer Sister     Social History   Tobacco Use  . Smoking status: Never Smoker  . Smokeless tobacco: Never Used  Substance Use Topics  . Alcohol use: Yes    Comment: glass of wine occasionally  . Drug use: No    No current facility-administered medications for this encounter.   Current Outpatient Medications:  .  acetaminophen (TYLENOL) 500 MG tablet, Take 500 mg by mouth every 8 (eight) hours as needed for moderate pain. , Disp: , Rfl:  .  acyclovir (ZOVIRAX) 200 MG capsule, Take 2 capsules (400 mg total) by mouth 2 (two) times daily., Disp: 120 capsule, Rfl: 1 .  acyclovir (ZOVIRAX) 200 MG capsule, TAKE 2 CAPSULES BY MOUTH 2 TIMES DAILY., Disp: 120 capsule, Rfl: 1 .  albuterol (PROVENTIL HFA;VENTOLIN HFA) 108 (90 Base) MCG/ACT inhaler, Inhale 1-2 puffs  into the lungs every 6 (six) hours as needed for wheezing or shortness of breath., Disp: 1 Inhaler, Rfl: 0 .  benzonatate (TESSALON) 200 MG capsule, Take 1 capsule (200 mg total) by mouth 3 (three) times daily as needed for cough., Disp: 30 capsule, Rfl: 0 .  chlorhexidine (PERIDEX) 0.12 % solution, SWISH WITH 15 MLS IN MOUTH OR THROAT AND SPIT TWICE DAILY AS DIRECTED, Disp: 473 mL, Rfl: 0 .  chlorpheniramine-HYDROcodone (TUSSIONEX PENNKINETIC ER) 10-8 MG/5ML SUER, Take 5 mLs by mouth every 12 (twelve) hours as needed for cough., Disp: 60 mL, Rfl: 0 .  docusate sodium (COLACE) 100 MG capsule, Take 100 mg by mouth 2 (two) times daily., Disp: , Rfl:  .  fluticasone (FLONASE) 50 MCG/ACT nasal spray, Place 2 sprays into both nostrils daily., Disp: 16 g, Rfl: 0 .  gabapentin (NEURONTIN) 100 MG capsule, 1 cap (100mg ) po twice daily (with breakfast and lunch) and 3 caps (300mg ) po at bedtime, Disp: 150 capsule, Rfl: 1 .  hydrOXYzine (ATARAX/VISTARIL) 25 MG tablet, Take 1-2 tablets (25-50 mg total) by mouth every 4 (four) hours as needed., Disp: 20 tablet, Rfl: 0 .  Loratadine 10 MG CAPS, Take 10 mg by mouth daily., Disp: , Rfl:  .  magic mouthwash w/lidocaine SOLN, Take 5 mLs by mouth 4 (four) times daily as needed for mouth pain. Swish and spit 1 Part viscous lidocaine 2% 1 Part Maalox (do not substitute Kaopectate) 1 Part diphenhydramine  12.5 mg per 5 ml elixir., Disp: 120 mL, Rfl: 2 .  senna (SENOKOT) 8.6 MG TABS tablet, Take 1 tablet by mouth daily as needed for mild constipation. , Disp: , Rfl:  .  Spacer/Aero-Holding Chambers (AEROCHAMBER PLUS) inhaler, Use as instructed, Disp: 1 each, Rfl: 2 .  triamcinolone cream (KENALOG) 0.1 %, Apply 1 application topically 2 (two) times daily., Disp: 30 g, Rfl: 0  No Known Allergies   ROS  As noted in HPI.   Physical Exam  BP 127/84 (BP Location: Left Arm)   Pulse 74   Temp 98.4 F (36.9 C) (Oral)   Resp 16   SpO2 99%   Constitutional: Well  developed, well nourished, no acute distress Eyes:  EOMI, conjunctiva normal bilaterally HENT: Normocephalic, atraumatic,mucus membranes moist.  Mild nasal congestion.  Erythematous, swollen turbinates.  No maxillary or frontal sinus tenderness.  Normal tonsils without exudates, uvula midline.  Positive cobblestoning.  No postnasal drip. Neck: No cervical lymphadenopathy, meningismus. Respiratory: Normal inspiratory effort.  Single expiratory wheeze left upper lobe clears after coughing.  No rhonchi, rales.  No chest wall tenderness Cardiovascular: Normal rate, regular rhythm, no murmurs, rubs, gallops GI: nondistended skin: No rash, skin intact Musculoskeletal: no deformities Neurologic: Alert & oriented x 3, no focal neuro deficits Psychiatric: Speech and behavior appropriate   ED Course   Medications - No data to display  Orders Placed This Encounter  Procedures  . DG Chest 2 View    Standing Status:   Standing    Number of Occurrences:   1    Order Specific Question:   Reason for Exam (SYMPTOM  OR DIAGNOSIS REQUIRED)    Answer:   Cough.  Immunocompromised.  Rule out pneumonia.    No results found for this or any previous visit (from the past 24 hour(s)). Dg Chest 2 View  Result Date: 10/09/2018 CLINICAL DATA:  Cough, question pneumonia, immunocompromised EXAM: CHEST - 2 VIEW COMPARISON:  None FINDINGS: Normal heart size, mediastinal contours, and pulmonary vascularity. Lungs clear. No pulmonary infiltrate, pleural effusion or pneumothorax. Bones appear demineralized. IMPRESSION: No acute abnormalities. Electronically Signed   By: Lavonia Dana M.D.   On: 10/09/2018 13:00    ED Clinical Impression  Viral URI with cough   ED Assessment/Plan  [place] Narcotic database reviewed for this patient, and feel that the risk/benefit ratio today is favorable for proceeding with a prescription for controlled substance.  Last opiate prescription was May 2019.  Presentation consistent with a  viral URI.  Will check a chest x-ray because of the immunocompromise and a single wheeze that I heard in the left upper lobe.  If negative, will send home with supportive treatment including Mucinex D, saline nasal irrigation with a Milta Deiters med rinse and distilled water as often as she want, Flonase, Tessalon for the cough during the day, Tussionex for the cough at night, 2 puffs from an albuterol inhaler using a spacer as needed.  We will also provide a primary care list for routine care.  To the ER if she gets worse.  Reviewed imaging independently.  Normal chest x-ray. see radiology report for full details.  Discussed imaging, MDM, treatment plan, and plan for follow-up with patient. Discussed sn/sx that should prompt return to the ED. patient agrees with plan.   Meds ordered this encounter  Medications  . albuterol (PROVENTIL HFA;VENTOLIN HFA) 108 (90 Base) MCG/ACT inhaler    Sig: Inhale 1-2 puffs into the lungs every 6 (six) hours as needed  for wheezing or shortness of breath.    Dispense:  1 Inhaler    Refill:  0  . fluticasone (FLONASE) 50 MCG/ACT nasal spray    Sig: Place 2 sprays into both nostrils daily.    Dispense:  16 g    Refill:  0  . Spacer/Aero-Holding Chambers (AEROCHAMBER PLUS) inhaler    Sig: Use as instructed    Dispense:  1 each    Refill:  2  . chlorpheniramine-HYDROcodone (TUSSIONEX PENNKINETIC ER) 10-8 MG/5ML SUER    Sig: Take 5 mLs by mouth every 12 (twelve) hours as needed for cough.    Dispense:  60 mL    Refill:  0  . benzonatate (TESSALON) 200 MG capsule    Sig: Take 1 capsule (200 mg total) by mouth 3 (three) times daily as needed for cough.    Dispense:  30 capsule    Refill:  0    *This clinic note was created using [employer]. Therefore, there may be occasional mistakes despite careful proofreading.   ?    Melynda Ripple, MD 10/09/18 1348

## 2018-10-09 NOTE — ED Triage Notes (Signed)
Pt here for cough and congestion 

## 2018-10-19 ENCOUNTER — Other Ambulatory Visit: Payer: Self-pay | Admitting: Hematology

## 2018-10-19 MED FILL — CHLORHEXIDINE 0.12% RINSE: 0.12 | 15 days supply | Qty: 473 | Fill #0

## 2018-10-22 MED FILL — ACYCLOVIR 200 MG CAP: 200 | 30 days supply | Qty: 120 | Fill #1

## 2018-11-02 NOTE — Progress Notes (Signed)
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 female who has been referred to [place] from [facility] for evaluation and management of B-cell lymphoma. She presents to her appointment today with her partner of 21 years. She states that she moved to the area ~2 years ago secondary to her sister's diagnosis of lung cancer (unfortunately, now passed) and taking care of her mother's Alzheimer's. Generally, prior to this issue she has been very healthy and without chronic medical conditions and not on chronic medical therapies/treatments.   Initially, the patient was admitted to [facility] for abdominal pain and significant hernia. She reports that she was visiting in [place] and while flying there she had an acute onset of sweats and left lower quadrant pain. She reports that her hernia was not present at that time, but as she continued to fly her hernia had become more noticeable and enlarged in her lower abdomen. Additionally, her partner reports that her episode of hydrosis was very significant, stating that it was pooling below her. On their arrival to [facility] on 06/21/17, a CT A/P was performed which showed an incarcerated hernia. Of note, her CT showed massive splenomegaly at 32cm in greatest dimension. Manual reduction of her hernia was attempted while in the ED but was unsuccessful. Pre-operative notes state that following intubation manual reduction was performed successfully and hernia was repaired laparoscopically. CT CAP was performed as well which was without the presence of additional surrounding LAD. She did have flow cytometry performed during her admission as well which showed B-cell lymphocytes which were monoclonal.  A bone marrow biopsy was also examined which showed that it was markedly hypercellular (~95%) with prominent interstitial infiltrate of small lymphocytes.   Prior to her diagnosis, she mostly felt typically well overall. She states that she did suffer from insomnia which she related to over-thinking at the time regarding her familial situation. She did experience some fatigue and weight loss with this as well. Her partner reports that she noticed her weight loss approximately 72moago, which she quantifies at approximately 30lbs since onset. Towards the end of this six month periods and just prior to the appearance of her hernia the patient began to notice some bothersome abdominal distension. She also noticed some significant fatigue and she would nap for longer periods following her daily workouts. Additionally, her partner would notice that she would have issues with breathing and this appeared irregular and more labored, especially at night while laying down. She also mentions that she did experience intermittent night sweats throughout this six month period as well. She denies lightheadedness, dizziness, abdominal pain, back pain, fever, chills, abnormal bruising/bleeding, bloody stools, epistaxis, hematuria, gingival bleeding, or other bleeding issues within this period. She has not received any blood transfusions and has not previously had a tattoo; she is unconcerned with prior HepC exposure.   Since her hernia surgery, she reports that she feels great symptomatically overall and much improved. She does have some issues with abdominal bloating and discomfort, but this has been manageable. No overt abdominal pain.   Throughout all of this, she has attempted to try to remain within good spirits. Losing her sister recently has been hard, but she has been working  through it with her family; however, her partner voices that they do not have strong social support in this area as most of their support is in  [place] where they previously lived. She has been working through her sister's death with grief counseling which has been helping her.    She has never previously smoked, but she reports some moderate second-hand smoke through her father at a younger age. Her father did die of a PE, but she is unaware of any other diagnoses of clotting disorders or cancers within her family. No h/o alcohol abuse, chemical/radiation exposures. She worked previously in Industrial/product designer.   PREVIOUS TREATMENT:  Rituximab weekly x4 beginning on 07/21/17-08/11/17 Received additional Rituxan weekly x 4 doses Rituximab  Weekly for 4 weeks starting 10/05/17 - completed 10/2017.  CURRENT TREATMENT:  S/p splenectomy at [facility] with Dr Shon Hough.  INTERIM HISTORY:   Michelle Cohen returns today for scheduled follow up of her SMZL. The patient's last visit with [place] was on 09/03/18. She is accompanied today by her partner. The pt reports that she is doing well overall.   The pt reports that she has been very active in the interim and has been going to the gym more frequently. The pt also notes that she has continued to have night sweats, and denies these being drenching, and notes that she "doesn't feel bad." She denies fatigue, fevers, or chills. She did have a viral URI about 3 weeks ago and notes that this has completely resolved. The pt denies noticing any new lumps or bumps at this time.  Lab results today (11/05/18) of CBC is as follows: all values are WNL except for WBC at 41.5k. 11/05/18 differential, CMP and LDH are pending  On review of systems, pt reports non-drenching night sweats, good energy levels, increased activity levels, eating well and denies fatigue, fevers, chills, concern for present infection, pain along the spine, noticing any new lumps or bumps, skin rashes, joint pains abdominal pains, leg swelling, and any other symptoms.   MEDICAL HISTORY:  Past Medical History:  Diagnosis Date  .  B-cell lymphoma ([place]) 06/2017     SURGICAL HISTORY: Past Surgical History:  Procedure Laterality Date  . left inguinal hernia repair Left   . SPLENECTOMY, TOTAL  02/13/2018    SOCIAL HISTORY: Social History   Socioeconomic History  . Marital status: Single    Spouse name: Not on file  . Number of children: Not on file  . Years of education: Not on file  . Highest education level: Not on file  Occupational History  . Not on file  Social Needs  . Financial resource strain: Not on file  . Food insecurity:    Worry: Not on file    Inability: Not on file  . Transportation needs:    Medical: Not on file    Non-medical: Not on file  Tobacco Use  . Smoking status: Never Smoker  . Smokeless tobacco: Never Used  Substance and Sexual Activity  . Alcohol use: Yes    Comment: glass of wine occasionally  . Drug use: No  . Sexual activity: Not on file  Lifestyle  . Physical activity:    Days per week: Not on file    Minutes per session: Not on file  . Stress: Not on file  Relationships  . Social connections:    Talks on phone: Not on file    Gets together: Not on file    Attends religious service:  Not on file    Active member of club or organization: Not on file    Attends meetings of clubs or organizations: Not on file    Relationship status: Not on file  . Intimate partner violence:    Fear of current or ex partner: Not on file    Emotionally abused: Not on file    Physically abused: Not on file    Forced sexual activity: Not on file  Other Topics Concern  . Not on file  Social History Narrative  . Not on file    FAMILY HISTORY: Sister - lung cancer Mother -Alzheimers dementia  ALLERGIES:  has No Known Allergies.  MEDICATIONS:  Current Outpatient Medications  Medication Sig Dispense Refill  . acetaminophen (TYLENOL) 500 MG tablet Take 500 mg by mouth every 8 (eight) hours as needed for moderate pain.     Marland Kitchen acyclovir (ZOVIRAX) 200 MG capsule Take 2 capsules  (400 mg total) by mouth 2 (two) times daily. 120 capsule 1  . acyclovir (ZOVIRAX) 200 MG capsule TAKE 2 CAPSULES BY MOUTH 2 TIMES DAILY. 120 capsule 1  . albuterol (PROVENTIL HFA;VENTOLIN HFA) 108 (90 Base) MCG/ACT inhaler Inhale 1-2 puffs into the lungs every 6 (six) hours as needed for wheezing or shortness of breath. 1 Inhaler 0  . benzonatate (TESSALON) 200 MG capsule Take 1 capsule (200 mg total) by mouth 3 (three) times daily as needed for cough. 30 capsule 0  . chlorhexidine (PERIDEX) 0.12 % solution SWISH WITH 15 MLS IN MOUTH OR THROAT AND SPIT TWICE DAILY AS DIRECTED 473 mL 0  . chlorpheniramine-HYDROcodone (TUSSIONEX PENNKINETIC ER) 10-8 MG/5ML SUER Take 5 mLs by mouth every 12 (twelve) hours as needed for cough. 60 mL 0  . docusate sodium (COLACE) 100 MG capsule Take 100 mg by mouth 2 (two) times daily.    . fluticasone (FLONASE) 50 MCG/ACT nasal spray Place 2 sprays into both nostrils daily. 16 g 0  . gabapentin (NEURONTIN) 100 MG capsule 1 cap ('100mg'$ ) po twice daily (with breakfast and lunch) and 3 caps ('300mg'$ ) po at bedtime 150 capsule 1  . hydrOXYzine (ATARAX/VISTARIL) 25 MG tablet Take 1-2 tablets (25-50 mg total) by mouth every 4 (four) hours as needed. 20 tablet 0  . Loratadine 10 MG CAPS Take 10 mg by mouth daily.    . magic mouthwash w/lidocaine SOLN Take 5 mLs by mouth 4 (four) times daily as needed for mouth pain. Swish and spit 1 Part viscous lidocaine 2% 1 Part Maalox (do not substitute Kaopectate) 1 Part diphenhydramine 12.5 mg per 5 ml elixir. 120 mL 2  . senna (SENOKOT) 8.6 MG TABS tablet Take 1 tablet by mouth daily as needed for mild constipation.     Marland Kitchen Spacer/Aero-Holding Chambers (AEROCHAMBER PLUS) inhaler Use as instructed 1 each 2  . triamcinolone cream (KENALOG) 0.1 % Apply 1 application topically 2 (two) times daily. 30 g 0   No current facility-administered medications for this visit.     REVIEW OF SYSTEMS:    A 10+ POINT REVIEW OF SYSTEMS WAS OBTAINED  including neurology, dermatology, psychiatry, cardiac, respiratory, lymph, extremities, GI, GU, Musculoskeletal, constitutional, breasts, reproductive, HEENT.  All pertinent positives are noted in the HPI.  All others are negative.   PHYSICAL EXAMINATION:  ECOG PERFORMANCE STATUS: 1 .BP 135/66 (BP Location: Left Arm, Patient Position: Sitting)   Pulse 99   Temp 98.5 F (36.9 C) (Oral)   Resp 18   Ht '5\' 6"'$  (1.676 m)   Wt  164 lb 9.6 oz (74.7 kg)   SpO2 99%   BMI 26.57 kg/m   GENERAL:alert, in no acute distress and comfortable SKIN: no acute rashes, no significant lesions EYES: conjunctiva are pink and non-injected, sclera anicteric OROPHARYNX: MMM, no exudates, no oropharyngeal erythema or ulceration NECK: supple, no JVD LYMPH:  no palpable lymphadenopathy in the cervical, axillary or inguinal regions LUNGS: clear to auscultation b/l with normal respiratory effort HEART: regular rate & rhythm ABDOMEN:  normoactive bowel sounds , non tender, not distended. No palpable hepatosplenomegaly.  Extremity: no pedal edema PSYCH: alert & oriented x 3 with fluent speech NEURO: no focal motor/sensory deficits   LABORATORY DATA:   I have reviewed the data as listed   CBC Latest Ref Rng & Units 11/05/2018 09/03/2018 06/18/2018  WBC 4.0 - 10.5 K/uL 41.5(H) 45.2(H) 27.6(H)  Hemoglobin 12.0 - 15.0 g/dL 13.8 13.7 14.2  Hematocrit 36.0 - 46.0 % 42.3 42.4 43.0  Platelets 150 - 400 K/uL 297 347 323  ANC 4.1k . CBC    Component Value Date/Time   WBC 41.5 (H) 11/05/2018 1147   RBC 4.38 11/05/2018 1147   HGB 13.8 11/05/2018 1147   HGB 8.0 (L) 02/07/2018 0905   HGB 9.1 (L) 09/14/2017 0835   HCT 42.3 11/05/2018 1147   HCT 35.2 03/05/2018 1331   HCT 29.0 (L) 09/14/2017 0835   PLT 297 11/05/2018 1147   PLT 50 (L) 02/07/2018 0905   PLT 46 (L) 09/14/2017 0835   MCV 96.6 11/05/2018 1147   MCV 92.1 09/14/2017 0835   MCH 31.5 11/05/2018 1147   MCHC 32.6 11/05/2018 1147   RDW 14.8 11/05/2018 1147     RDW 18.2 (H) 09/14/2017 0835   LYMPHSABS PENDING 11/05/2018 1147   LYMPHSABS 2.0 09/14/2017 0835   MONOABS PENDING 11/05/2018 1147   MONOABS 0.3 09/14/2017 0835   EOSABS PENDING 11/05/2018 1147   EOSABS 0.0 09/14/2017 0835   BASOSABS PENDING 11/05/2018 1147   BASOSABS 0.0 09/14/2017 0835    . CMP Latest Ref Rng & Units 09/03/2018 06/18/2018 04/16/2018  Glucose 70 - 99 mg/dL 102(H) 101(H) 81  BUN 8 - 23 mg/dL '13 20 18  '$ Creatinine 0.44 - 1.00 mg/dL 0.80 0.87 0.81  Sodium 135 - 145 mmol/L 141 141 141  Potassium 3.5 - 5.1 mmol/L 4.7 4.6 4.5  Chloride 98 - 111 mmol/L 107 108 105  CO2 22 - 32 mmol/L '25 23 26  '$ Calcium 8.9 - 10.3 mg/dL 9.3 8.9 9.8  Total Protein 6.5 - 8.1 g/dL 7.0 7.1 7.6  Total Bilirubin 0.3 - 1.2 mg/dL 1.1 0.9 0.9  Alkaline Phos 38 - 126 U/L 83 87 82  AST 15 - 41 U/L '24 21 25  '$ ALT 0 - 44 U/L 34 29 34   Component     Latest Ref Rng & Units 07/13/2017  LDH     125 - 245 U/L 232  Hep C Virus Ab     0.0 - 0.9 s/co ratio <0.1  HIV Screen 4th Generation wRfx     Non Reactive Non Reactive  Hep B Core Ab, Tot     Negative Negative  Hepatitis B Surface Ag     Negative Negative   Component     Latest Ref Rng & Units 07/28/2017  LDH     125 - 245 U/L 200  Uric Acid, Serum     2.6 - 7.4 mg/dl 6.0  Phosphorus     2.5 - 4.5 mg/dL 3.8   03/05/18  Pathology Report Splenectomy:      RADIOGRAPHIC STUDIES: I have personally reviewed the radiological images as listed and agreed with the findings in the report. Dg Chest 2 View  Result Date: 10/09/2018 CLINICAL DATA:  Cough, question pneumonia, immunocompromised EXAM: CHEST - 2 VIEW COMPARISON:  None FINDINGS: Normal heart size, mediastinal contours, and pulmonary vascularity. Lungs clear. No pulmonary infiltrate, pleural effusion or pneumothorax. Bones appear demineralized. IMPRESSION: No acute abnormalities. Electronically Signed   By: Lavonia Dana M.D.   On: 10/09/2018 13:00     ASSESSMENT & PLAN:   Michelle Cohen  is a wonderful 68 y.o. 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  Her clinical presentation is also representative of this and that this may have been present over months to possibly years.  -With her pattern of involvement and results thus far it is likely that she has splenic marginal zone type lymphoma.  -PET scan on 07/25/2017 with results showing: Massive splenomegaly, splenic volume 5770 cubic cm, with homogeneous low grade activity throughout the spleen equal to that of the physiologic activity in the liver, and above the background blood pool activity in the mediastinum. No pathologic adenopathy identified. No bony involvement noted.   -CT A/p on 08/23/17 with results of: Marked splenomegaly without substantial interval change in splenic size/volume. Borderline lymphadenopathy in the hepatoduodenal ligament. Aortic Atherosclerosis. I discussed this with the patient in great detail.  -CT A/p on 09/15/17 shows significant splenomegaly shows minimal smaller change in size, with no evidence of bowel obstruction.   PLAN:  -Discussed pt labwork today, 11/05/18; WBC slightly improved to 41.5k. No anemia and normal platelets. -The pt shows no clinical or lab progression of her SMZL at this time.  -No indication for further treatment at this time, especially considering her previous difficulty tolerating Rituxan -Will continue to watch WBC, which is expected to be marginally higher in this patient after splenectomy  -Other blood counts not affected by lymphocytosis, some mild non-drenching night sweats, but no significant  constitutional symptoms -Recommend continuing reasonable infection prevention precautions -Recommended that the pt continue to eat well, drink at least 48-64 oz of water each day, and walk 20-30 minutes each day. -Will see the pt back in 2 months  2. Pancytopenia with leukocytosis and anemia  Due to SMZL and hypersplenism Now resolved  PLAN: - completed pre-splenectomy vaccinations. -pancytopenia resolved post-splenectomy  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 Had to complete Rituxan at lower rate. Patient would not be a candidate for rapid infusion protocol and would need to keep the next infusion at the tolerated rate and not rate escalate. Plan  Completed with extensive pre-medications.  4. Oral mucositis/HSV outbreak - resolved -continue with acyclovir for prophylaxis   RTC with Dr Irene Limbo with labs in 2 months   The total time spent in the appt was 20 minutes and more than 50% was on counseling and direct patient cares.   Sullivan Lone MD [place] AAHIVMS [facility] Hematology/Oncology Physician [place]  (Office):  [phone] (Work cell):  [phone] (Fax):           [phone]  I, Baldwin Jamaica, am acting as a scribe for Dr. Sullivan Lone.   .I have reviewed the above documentation for accuracy and completeness, and I agree with the above. Brunetta Genera MD

## 2018-11-05 ENCOUNTER — Telehealth: Payer: Self-pay | Admitting: Hematology

## 2018-11-05 ENCOUNTER — Inpatient Hospital Stay (HOSPITAL_BASED_OUTPATIENT_CLINIC_OR_DEPARTMENT_OTHER): Payer: Medicare Other | Admitting: Hematology

## 2018-11-05 ENCOUNTER — Inpatient Hospital Stay: Payer: Medicare Other | Attending: Hematology

## 2018-11-05 VITALS — BP 135/66 | HR 99 | Temp 98.5°F | Resp 18 | Ht 66.0 in | Wt 164.6 lb

## 2018-11-05 DIAGNOSIS — Z9081 Acquired absence of spleen: Secondary | ICD-10-CM

## 2018-11-05 DIAGNOSIS — Z79899 Other long term (current) drug therapy: Secondary | ICD-10-CM

## 2018-11-05 DIAGNOSIS — D61818 Other pancytopenia: Secondary | ICD-10-CM | POA: Diagnosis not present

## 2018-11-05 DIAGNOSIS — C8307 Small cell B-cell lymphoma, spleen: Secondary | ICD-10-CM

## 2018-11-05 DIAGNOSIS — Z9221 Personal history of antineoplastic chemotherapy: Secondary | ICD-10-CM | POA: Insufficient documentation

## 2018-11-05 DIAGNOSIS — D72829 Elevated white blood cell count, unspecified: Secondary | ICD-10-CM

## 2018-11-05 DIAGNOSIS — B009 Herpesviral infection, unspecified: Secondary | ICD-10-CM

## 2018-11-05 LAB — CBC WITH DIFFERENTIAL/PLATELET
Abs Immature Granulocytes: 0.12 10*3/uL — ABNORMAL HIGH (ref 0.00–0.07)
Basophils Absolute: 0 10*3/uL (ref 0.0–0.1)
Basophils Relative: 0 %
EOS ABS: 0 10*3/uL (ref 0.0–0.5)
Eosinophils Relative: 0 %
HCT: 42.3 % (ref 36.0–46.0)
Hemoglobin: 13.8 g/dL (ref 12.0–15.0)
Immature Granulocytes: 0 %
Lymphocytes Relative: 81 %
Lymphs Abs: 30.6 10*3/uL — ABNORMAL HIGH (ref 0.7–4.0)
MCH: 31.5 pg (ref 26.0–34.0)
MCHC: 32.6 g/dL (ref 30.0–36.0)
MCV: 96.6 fL (ref 80.0–100.0)
Monocytes Absolute: 3.7 10*3/uL — ABNORMAL HIGH (ref 0.1–1.0)
Monocytes Relative: 9 %
NEUTROS PCT: 8 %
Neutro Abs: 3.3 10*3/uL (ref 1.7–7.7)
Platelets: 297 10*3/uL (ref 150–400)
RBC: 4.38 MIL/uL (ref 3.87–5.11)
RDW: 14.8 % (ref 11.5–15.5)
WBC: 41.5 10*3/uL — ABNORMAL HIGH (ref 4.0–10.5)
nRBC: 0 % (ref 0.0–0.2)

## 2018-11-05 LAB — CMP (CANCER CENTER ONLY)
ALT: 25 U/L (ref 0–44)
AST: 23 U/L (ref 15–41)
Albumin: 3.9 g/dL (ref 3.5–5.0)
Alkaline Phosphatase: 69 U/L (ref 38–126)
Anion gap: 6 (ref 5–15)
BUN: 9 mg/dL (ref 8–23)
CO2: 26 mmol/L (ref 22–32)
CREATININE: 0.81 mg/dL (ref 0.44–1.00)
Calcium: 9.3 mg/dL (ref 8.9–10.3)
Chloride: 108 mmol/L (ref 98–111)
GFR, Estimated: >60 mL/min (ref >60)
Glucose, Bld: 97 mg/dL (ref 70–99)
Potassium: 4.6 mmol/L (ref 3.5–5.1)
SODIUM: 140 mmol/L (ref 135–145)
Total Bilirubin: 1.6 mg/dL — ABNORMAL HIGH (ref 0.3–1.2)
Total Protein: 6.9 g/dL (ref 6.5–8.1)

## 2018-11-05 LAB — SAVE SMEAR

## 2018-11-05 LAB — LACTATE DEHYDROGENASE: LDH: 188 U/L (ref 98–192)

## 2018-11-05 NOTE — Telephone Encounter (Signed)
Scheduled appt per 2/10 los. ° °Printed calendar and avs. °

## 2018-11-23 ENCOUNTER — Encounter: Payer: Self-pay | Admitting: Hematology

## 2018-11-26 ENCOUNTER — Telehealth: Payer: Self-pay | Admitting: *Deleted

## 2018-11-26 ENCOUNTER — Other Ambulatory Visit: Payer: Self-pay | Admitting: Hematology

## 2018-11-26 MED FILL — CHLORHEXIDINE 0.12% RINSE: 0.12 | 15 days supply | Qty: 473 | Fill #0

## 2018-11-26 MED FILL — ACYCLOVIR 200 MG CAP: 200 | 30 days supply | Qty: 120 | Fill #1

## 2018-11-26 NOTE — Telephone Encounter (Signed)
Contacted patient in response to message in Mychart asking Dr. Irene Limbo about advisability of going to the [facility] to work out. Per Dr. Irene Limbo, avoid obviously sick individuals and practice good handwashing as she described following touching any item. Patient verbalized understanding.

## 2018-11-26 NOTE — Telephone Encounter (Signed)
Called [place].  "Concerned because I'm at higher risk.  Being told to stay at home with the coronavirus.  Average one hour exercise to [facility] four times weekIy.  Wear mask, clean equipment before use.  I do not have a cough or fever.  I'm looking for [facility] to appear.  Does anyone in [place] have it?  I know not to report to the ED."  Advised to continue steps to prevent infection, avoid close contact with people.  contact office for any increased temperature, changes or symptoms.

## 2018-12-18 ENCOUNTER — Other Ambulatory Visit: Payer: Self-pay | Admitting: Hematology

## 2018-12-18 ENCOUNTER — Telehealth: Payer: Self-pay | Admitting: *Deleted

## 2018-12-18 MED FILL — CHLORHEXIDINE 0.12% RINSE: 0.12 | 15 days supply | Qty: 473 | Fill #0

## 2018-12-18 MED FILL — ACYCLOVIR 200 MG CAP: 200 | 30 days supply | Qty: 120 | Fill #0

## 2018-12-18 NOTE — Addendum Note (Signed)
Addended by: Cherylynn Ridges on: 12/18/2018 02:30 PM   Modules accepted: Orders

## 2018-12-18 NOTE — Telephone Encounter (Signed)
"  [name] [phone]).  Called pharmacy about refills.  Wanted to let you all know because I'm getting nervous.  Do not want to run out of medicine to help mouth sores.  I'm okay other than mouth."  Noted in-basket requests.

## 2018-12-28 ENCOUNTER — Telehealth: Payer: Self-pay | Admitting: General Practice

## 2018-12-28 ENCOUNTER — Encounter: Payer: Self-pay | Admitting: General Practice

## 2018-12-28 NOTE — Progress Notes (Signed)
[place] Team contacted patient to assess for food insecurity and other psychosocial needs during current COVID19 pandemic.  Unable to reach by phone, left VM w contact information and request to return call.     Michelle Cohen, [place]

## 2018-12-28 NOTE — Telephone Encounter (Signed)
[place] CSW Progress Notes  Pt returned call, "I am doing fine."   Has upcoming appointment w Dr Irene Limbo.  Is sheltering in place at home, not going out.  Partner is ensuring all needs are met.  Encouraged connecting w others and activities that stimulate creativity via [employer].  Edwyna Shell, LCSW Clinical Social Worker Phone:  [phone]

## 2019-01-03 ENCOUNTER — Telehealth: Payer: Self-pay | Admitting: *Deleted

## 2019-01-03 NOTE — Telephone Encounter (Signed)
Patient asked if Dr. Irene Limbo thought it was ok for her to postpone her appt on 4/13. Per Dr.Kale, patient can delay appt by 1 month. Contacted patient - patient in agreement. Appts for 4/13 cancelled. Schedule message sent.

## 2019-01-07 ENCOUNTER — Inpatient Hospital Stay: Payer: Medicare Other | Admitting: Hematology

## 2019-01-07 ENCOUNTER — Encounter: Payer: Self-pay | Admitting: Hematology

## 2019-01-07 ENCOUNTER — Telehealth: Payer: Self-pay | Admitting: Hematology

## 2019-01-07 ENCOUNTER — Inpatient Hospital Stay: Payer: Medicare Other

## 2019-01-07 NOTE — Telephone Encounter (Signed)
Scheduled appt per 4/9 sch message - pt aware of new appt date and time

## 2019-02-05 NOTE — Progress Notes (Signed)
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 female who has been referred to [place] from [facility] for evaluation and management of B-cell lymphoma. She presents to her appointment today with her partner of 21 years. She states that she moved to the area ~2 years ago secondary to her sister's diagnosis of lung cancer (unfortunately, now passed) and taking care of her mother's Alzheimer's. Generally, prior to this issue she has been very healthy and without chronic medical conditions and not on chronic medical therapies/treatments.   Initially, the patient was admitted to [facility] for abdominal pain and significant hernia. She reports that she was visiting in [place] and while flying there she had an acute onset of sweats and left lower quadrant pain. She reports that her hernia was not present at that time, but as she continued to fly her hernia had become more noticeable and enlarged in her lower abdomen. Additionally, her partner reports that her episode of hydrosis was very significant, stating that it was pooling below her. On their arrival to [facility] on 06/21/17, a CT A/P was performed which showed an incarcerated hernia. Of note, her CT showed massive splenomegaly at 32cm in greatest dimension. Manual reduction of her hernia was attempted while in the ED but was unsuccessful. Pre-operative notes state that following intubation manual reduction was performed successfully and hernia was repaired laparoscopically. CT CAP was performed as well which was without the presence of additional surrounding LAD. She did have flow cytometry performed during her admission as well which showed B-cell lymphocytes which were monoclonal.  A bone marrow biopsy was also examined which showed that it was markedly hypercellular (~95%) with prominent interstitial infiltrate of small lymphocytes.   Prior to her diagnosis, she mostly felt typically well overall. She states that she did suffer from insomnia which she related to over-thinking at the time regarding her familial situation. She did experience some fatigue and weight loss with this as well. Her partner reports that she noticed her weight loss approximately 58moago, which she quantifies at approximately 30lbs since onset. Towards the end of this six month periods and just prior to the appearance of her hernia the patient began to notice some bothersome abdominal distension. She also noticed some significant fatigue and she would nap for longer periods following her daily workouts. Additionally, her partner would notice that she would have issues with breathing and this appeared irregular and more labored, especially at night while laying down. She also mentions that she did experience intermittent night sweats throughout this six month period as well. She denies lightheadedness, dizziness, abdominal pain, back pain, fever, chills, abnormal bruising/bleeding, bloody stools, epistaxis, hematuria, gingival bleeding, or other bleeding issues within this period. She has not received any blood transfusions and has not previously had a tattoo; she is unconcerned with prior HepC exposure.   Since her hernia surgery, she reports that she feels great symptomatically overall and much improved. She does have some issues with abdominal bloating and discomfort, but this has been manageable. No overt abdominal pain.   Throughout all of this, she has attempted to try to remain within good spirits. Losing her sister recently has been hard, but she has been working  through it with her family; however, her partner voices that they do not have strong social support in this area as most of their support is in   where they previously lived. She has been working through her sister's death with grief counseling which has been helping her.    She has never previously smoked, but she reports some moderate second-hand smoke through her father at a younger age. Her father did die of a PE, but she is unaware of any other diagnoses of clotting disorders or cancers within her family. No h/o alcohol abuse, chemical/radiation exposures. She worked previously in Industrial/product designer.   PREVIOUS TREATMENT:  Rituximab weekly x4 beginning on 07/21/17-08/11/17 Received additional Rituxan weekly x 4 doses Rituximab  Weekly for 4 weeks starting 10/05/17 - completed 10/2017.  CURRENT TREATMENT:  S/p splenectomy at [facility] with Dr Shon Hough.  INTERIM HISTORY:   Michelle Cohen returns today for scheduled follow up of her SMZL. The patient's last visit with [place] was on 11/05/18. The pt reports that she is doing well overall.  The pt reports that she has been avoiding public spaces and being around people during the Covid-19 pandemic. She denies concerns for infections at this time. She notes that she has continued to remain active with walking around her neighborhood.  The pt notes that she continues to have some mouth sores, and notes that these flare with her stress levels and acidic food/liquid consumption.   The pt notes that she has been eating well and has gained some weight. She denies fevers or chills. She notes that she has night sweats "once in a while," but denies these being drenching.   She notes that she recently had a bout of poison ivy and has been using Triamcinolone.  Lab results today (02/06/19) of CBC w/diff and CMP is as follows: all values are WNL except for WBC at 66.4k. 02/06/19 LDH is pending  On review of systems, pt reports good energy levels, staying active, eating well, mild weight gain, and denies concerns for infections, fevers, chills, drenching night sweats, noticing any  new lumps or bumps, abdominal pains, skin rashes, and any other symptoms.   MEDICAL HISTORY:  Past Medical History:  Diagnosis Date  . B-cell lymphoma ([place]) 06/2017     SURGICAL HISTORY: Past Surgical History:  Procedure Laterality Date  . left inguinal hernia repair Left   . SPLENECTOMY, TOTAL  02/13/2018    SOCIAL HISTORY: Social History   Socioeconomic History  . Marital status: Single    Spouse name: Not on file  . Number of children: Not on file  . Years of education: Not on file  . Highest education level: Not on file  Occupational History  . Not on file  Social Needs  . Financial resource strain: Not on file  . Food insecurity:    Worry: Not on file    Inability: Not on file  . Transportation needs:    Medical: Not on file    Non-medical: Not on file  Tobacco Use  . Smoking status: Never Smoker  . Smokeless tobacco: Never Used  Substance and Sexual Activity  . Alcohol use: Yes    Comment: glass of wine occasionally  . Drug use: No  . Sexual activity: Not on file  Lifestyle  . Physical activity:    Days per week: Not on file    Minutes per session: Not on file  . Stress: Not on file  Relationships  . Social  connections:    Talks on phone: Not on file    Gets together: Not on file    Attends religious service: Not on file    Active member of club or organization: Not on file    Attends meetings of clubs or organizations: Not on file    Relationship status: Not on file  . Intimate partner violence:    Fear of current or ex partner: Not on file    Emotionally abused: Not on file    Physically abused: Not on file    Forced sexual activity: Not on file  Other Topics Concern  . Not on file  Social History Narrative  . Not on file    FAMILY HISTORY: Sister - lung cancer Mother -Alzheimers dementia  ALLERGIES:  has No Known Allergies.  MEDICATIONS:  Current Outpatient Medications  Medication Sig Dispense Refill  . acetaminophen (TYLENOL) 500  MG tablet Take 500 mg by mouth every 8 (eight) hours as needed for moderate pain.     Marland Kitchen acyclovir (ZOVIRAX) 200 MG capsule TAKE 2 CAPSULES BY MOUTH TWICE DAILY 120 capsule 1  . albuterol (PROVENTIL HFA;VENTOLIN HFA) 108 (90 Base) MCG/ACT inhaler Inhale 1-2 puffs into the lungs every 6 (six) hours as needed for wheezing or shortness of breath. 1 Inhaler 0  . benzonatate (TESSALON) 200 MG capsule Take 1 capsule (200 mg total) by mouth 3 (three) times daily as needed for cough. 30 capsule 0  . chlorhexidine (PERIDEX) 0.12 % solution SWISH WITH 15 MLS IN MOUTH OR THROAT THEN SPIT --USE TWICE DAILY AS DIRECTED 473 mL 1  . chlorpheniramine-HYDROcodone (TUSSIONEX PENNKINETIC ER) 10-8 MG/5ML SUER Take 5 mLs by mouth every 12 (twelve) hours as needed for cough. 60 mL 0  . docusate sodium (COLACE) 100 MG capsule Take 100 mg by mouth 2 (two) times daily.    . fluticasone (FLONASE) 50 MCG/ACT nasal spray Place 2 sprays into both nostrils daily. 16 g 0  . gabapentin (NEURONTIN) 100 MG capsule 1 cap (132m) po twice daily (with breakfast and lunch) and 3 caps (3088m po at bedtime 150 capsule 1  . hydrOXYzine (ATARAX/VISTARIL) 25 MG tablet Take 1-2 tablets (25-50 mg total) by mouth every 4 (four) hours as needed. 20 tablet 0  . Loratadine 10 MG CAPS Take 10 mg by mouth daily.    . magic mouthwash w/lidocaine SOLN Take 5 mLs by mouth 4 (four) times daily as needed for mouth pain. Swish and spit 1 Part viscous lidocaine 2% 1 Part Maalox (do not substitute Kaopectate) 1 Part diphenhydramine 12.5 mg per 5 ml elixir. 120 mL 2  . senna (SENOKOT) 8.6 MG TABS tablet Take 1 tablet by mouth daily as needed for mild constipation.     . Marland Kitchenpacer/Aero-Holding Chambers (AEROCHAMBER PLUS) inhaler Use as instructed 1 each 2  . triamcinolone cream (KENALOG) 0.1 % Apply 1 application topically 2 (two) times daily. 30 g 0   No current facility-administered medications for this visit.     REVIEW OF SYSTEMS:    A 10+ POINT  REVIEW OF SYSTEMS WAS OBTAINED including neurology, dermatology, psychiatry, cardiac, respiratory, lymph, extremities, GI, GU, Musculoskeletal, constitutional, breasts, reproductive, HEENT.  All pertinent positives are noted in the HPI.  All others are negative.   PHYSICAL EXAMINATION:  ECOG PERFORMANCE STATUS: 1 .BP 128/83 (BP Location: Left Arm, Patient Position: Sitting)   Pulse 63   Temp 98.7 F (37.1 C) (Oral)   Resp 17   Ht '5\' 6"'  (1.676 m)  Wt 167 lb 8 oz (76 kg)   SpO2 99%   BMI 27.04 kg/m   GENERAL:alert, in no acute distress and comfortable SKIN: no acute rashes, no significant lesions EYES: conjunctiva are pink and non-injected, sclera anicteric OROPHARYNX: MMM, no exudates, no oropharyngeal erythema or ulceration NECK: supple, no JVD LYMPH:  no palpable lymphadenopathy in the cervical, axillary or inguinal regions LUNGS: clear to auscultation b/l with normal respiratory effort HEART: regular rate & rhythm ABDOMEN:  normoactive bowel sounds , non tender, not distended. No palpable hepatosplenomegaly.  Extremity: no pedal edema PSYCH: alert & oriented x 3 with fluent speech NEURO: no focal motor/sensory deficits   LABORATORY DATA:   I have reviewed the data as listed   CBC Latest Ref Rng & Units 02/06/2019 11/05/2018 09/03/2018  WBC 4.0 - 10.5 K/uL 66.4(HH) 41.5(H) 45.2(H)  Hemoglobin 12.0 - 15.0 g/dL 13.8 13.8 13.7  Hematocrit 36.0 - 46.0 % 42.8 42.3 42.4  Platelets 150 - 400 K/uL 293 297 347  ANC 4.1k . CBC    Component Value Date/Time   WBC 66.4 (HH) 02/06/2019 1419   RBC 4.34 02/06/2019 1419   HGB 13.8 02/06/2019 1419   HGB 8.0 (L) 02/07/2018 0905   HGB 9.1 (L) 09/14/2017 0835   HCT 42.8 02/06/2019 1419   HCT 35.2 03/05/2018 1331   HCT 29.0 (L) 09/14/2017 0835   PLT 293 02/06/2019 1419   PLT 50 (L) 02/07/2018 0905   PLT 46 (L) 09/14/2017 0835   MCV 98.6 02/06/2019 1419   MCV 92.1 09/14/2017 0835   MCH 31.8 02/06/2019 1419   MCHC 32.2 02/06/2019 1419    RDW 14.6 02/06/2019 1419   RDW 18.2 (H) 09/14/2017 0835   LYMPHSABS 55.8 (H) 02/06/2019 1419   LYMPHSABS 2.0 09/14/2017 0835   MONOABS 1.3 (H) 02/06/2019 1419   MONOABS 0.3 09/14/2017 0835   EOSABS 0.0 02/06/2019 1419   EOSABS 0.0 09/14/2017 0835   BASOSABS 0.7 (H) 02/06/2019 1419   BASOSABS 0.0 09/14/2017 0835    . CMP Latest Ref Rng & Units 02/06/2019 11/05/2018 09/03/2018  Glucose 70 - 99 mg/dL 97 97 102(H)  BUN 8 - 23 mg/dL '13 9 13  ' Creatinine 0.44 - 1.00 mg/dL 0.81 0.81 0.80  Sodium 135 - 145 mmol/L 141 140 141  Potassium 3.5 - 5.1 mmol/L 4.1 4.6 4.7  Chloride 98 - 111 mmol/L 107 108 107  CO2 22 - 32 mmol/L '24 26 25  ' Calcium 8.9 - 10.3 mg/dL 9.0 9.3 9.3  Total Protein 6.5 - 8.1 g/dL 7.3 6.9 7.0  Total Bilirubin 0.3 - 1.2 mg/dL 1.1 1.6(H) 1.1  Alkaline Phos 38 - 126 U/L 71 69 83  AST 15 - 41 U/L '21 23 24  ' ALT 0 - 44 U/L 23 25 34   Component     Latest Ref Rng & Units 07/13/2017  LDH     125 - 245 U/L 232  Hep C Virus Ab     0.0 - 0.9 s/co ratio <0.1  HIV Screen 4th Generation wRfx     Non Reactive Non Reactive  Hep B Core Ab, Tot     Negative Negative  Hepatitis B Surface Ag     Negative Negative   Component     Latest Ref Rng & Units 07/28/2017  LDH     125 - 245 U/L 200  Uric Acid, Serum     2.6 - 7.4 mg/dl 6.0  Phosphorus     2.5 - 4.5 mg/dL 3.8  03/05/18 Pathology Report Splenectomy:      RADIOGRAPHIC STUDIES: I have personally reviewed the radiological images as listed and agreed with the findings in the report. No results found.   ASSESSMENT & PLAN:   Michelle Cohen is a wonderful 68 y.o. caucasian female who presents to our clinic to discuss ongoing management of the following:   1. 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  Her clinical presentation is also representative of this and that this may have been present over months to possibly years.  -With her pattern of involvement and results thus far it is likely that she has splenic marginal zone type lymphoma.  -PET scan on 07/25/2017 with results showing: Massive splenomegaly, splenic volume 5770 cubic cm, with homogeneous low grade activity throughout the spleen equal to that of the physiologic activity in the liver, and above the background blood pool activity in the mediastinum. No pathologic adenopathy identified. No bony involvement noted.   -CT A/p on 08/23/17 with results of: Marked splenomegaly without substantial interval change in splenic size/volume. Borderline lymphadenopathy in the hepatoduodenal ligament. Aortic Atherosclerosis. I discussed this with the patient in great detail.  -CT A/p on 09/15/17 shows significant splenomegaly shows minimal smaller change in size, with no evidence of bowel obstruction.   PLAN:  -Discussed pt labwork today, 02/06/19; WBC at 66.4k, HGB normal at 13.8 and PLT normal at 293k. Chemistries are normal. -02/06/19 LDH is . Lab Results  Component Value Date   LDH 208 (H) 02/06/2019   -Discussed that in the absence of constitutional symptoms, cytopenias or threat of organ injury, we will continue watchful observation of her leukocytosis -No indication for further treatment at this time, especially considering her previous difficulty tolerating Rituxan -Will continue to watch WBC, which is expected to be marginally higher in this patient after splenectomy  -Recommended that the pt continue to eat well, drink at least 48-64 oz of water each day, and walk 20-30 minutes each day. -Continue infection prevention strategies, crowd avoidance, and frequent handwashing -Will see the pt back in 3 months  2. Pancytopenia with leukocytosis and anemia  Due to SMZL  and hypersplenism Now resolved  PLAN: - completed pre-splenectomy vaccinations. -pancytopenia resolved post-splenectomy  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 Had to complete Rituxan at lower rate. Patient would not be a candidate for rapid infusion protocol and would need to keep the next infusion at the tolerated rate and not rate escalate. Plan  Completed with extensive pre-medications.  4. Oral mucositis/HSV outbreak - resolved -continue with acyclovir as needed   RTC with Dr Irene Limbo with labs in 3 months   The total time spent in the appt was 25 minutes and more than 50% was on counseling and direct patient cares.   Sullivan Lone MD [place] AAHIVMS [facility] Hematology/Oncology Physician [facility]  (Office):       [phone] (Work cell):  [phone] (Fax):           [phone]  I, Baldwin Jamaica, am acting as a scribe for Dr. Sullivan Lone.   .I have reviewed the above documentation for accuracy and completeness, and I  agree with the above. Brunetta Genera MD

## 2019-02-06 ENCOUNTER — Telehealth: Payer: Self-pay | Admitting: Hematology

## 2019-02-06 ENCOUNTER — Inpatient Hospital Stay (HOSPITAL_BASED_OUTPATIENT_CLINIC_OR_DEPARTMENT_OTHER): Payer: Medicare Other | Admitting: Hematology

## 2019-02-06 ENCOUNTER — Inpatient Hospital Stay: Payer: Medicare Other | Attending: Hematology

## 2019-02-06 ENCOUNTER — Other Ambulatory Visit: Payer: Self-pay

## 2019-02-06 VITALS — BP 128/83 | HR 63 | Temp 98.7°F | Resp 17 | Ht 66.0 in | Wt 167.5 lb

## 2019-02-06 DIAGNOSIS — D61818 Other pancytopenia: Secondary | ICD-10-CM

## 2019-02-06 DIAGNOSIS — Z79899 Other long term (current) drug therapy: Secondary | ICD-10-CM | POA: Diagnosis not present

## 2019-02-06 DIAGNOSIS — R634 Abnormal weight loss: Secondary | ICD-10-CM

## 2019-02-06 DIAGNOSIS — C8307 Small cell B-cell lymphoma, spleen: Secondary | ICD-10-CM

## 2019-02-06 DIAGNOSIS — L237 Allergic contact dermatitis due to plants, except food: Secondary | ICD-10-CM | POA: Diagnosis not present

## 2019-02-06 LAB — CBC WITH DIFFERENTIAL/PLATELET
Abs Immature Granulocytes: 0 10*3/uL (ref 0.00–0.07)
Band Neutrophils: 1 %
Basophils Absolute: 0.7 10*3/uL — ABNORMAL HIGH (ref 0.0–0.1)
Basophils Relative: 1 %
Eosinophils Absolute: 0 10*3/uL (ref 0.0–0.5)
Eosinophils Relative: 0 %
HCT: 42.8 % (ref 36.0–46.0)
Hemoglobin: 13.8 g/dL (ref 12.0–15.0)
Lymphocytes Relative: 84 %
Lymphs Abs: 55.8 10*3/uL — ABNORMAL HIGH (ref 0.7–4.0)
MCH: 31.8 pg (ref 26.0–34.0)
MCHC: 32.2 g/dL (ref 30.0–36.0)
MCV: 98.6 fL (ref 80.0–100.0)
Monocytes Absolute: 1.3 10*3/uL — ABNORMAL HIGH (ref 0.1–1.0)
Monocytes Relative: 2 %
Neutro Abs: 8.6 10*3/uL (ref 1.7–17.7)
Neutrophils Relative %: 12 %
Platelets: 293 10*3/uL (ref 150–400)
RBC: 4.34 MIL/uL (ref 3.87–5.11)
RDW: 14.6 % (ref 11.5–15.5)
WBC: 66.4 10*3/uL (ref 4.0–10.5)
nRBC: 0 % (ref 0.0–0.2)

## 2019-02-06 LAB — CMP (CANCER CENTER ONLY)
ALT: 23 U/L (ref 0–44)
AST: 21 U/L (ref 15–41)
Albumin: 3.9 g/dL (ref 3.5–5.0)
Alkaline Phosphatase: 71 U/L (ref 38–126)
Anion gap: 10 (ref 5–15)
BUN: 13 mg/dL (ref 8–23)
CO2: 24 mmol/L (ref 22–32)
Calcium: 9 mg/dL (ref 8.9–10.3)
Chloride: 107 mmol/L (ref 98–111)
Creatinine: 0.81 mg/dL (ref 0.44–1.00)
GFR, Est AFR Am: >60 mL/min (ref >60)
GFR, Estimated: >60 mL/min (ref >60)
Glucose, Bld: 97 mg/dL (ref 70–99)
Potassium: 4.1 mmol/L (ref 3.5–5.1)
Sodium: 141 mmol/L (ref 135–145)
Total Bilirubin: 1.1 mg/dL (ref 0.3–1.2)
Total Protein: 7.3 g/dL (ref 6.5–8.1)

## 2019-02-06 LAB — LACTATE DEHYDROGENASE: LDH: 208 U/L — ABNORMAL HIGH (ref 98–192)

## 2019-02-06 MED FILL — ACYCLOVIR 200 MG CAP: 200 | 30 days supply | Qty: 120 | Fill #1

## 2019-02-06 MED FILL — CHLORHEXIDINE 0.12% RINSE: 0.12 | 15 days supply | Qty: 473 | Fill #1

## 2019-02-06 NOTE — Telephone Encounter (Signed)
Scheduled appt per 5/13 los. °

## 2019-03-09 ENCOUNTER — Other Ambulatory Visit: Payer: Self-pay

## 2019-03-09 ENCOUNTER — Ambulatory Visit (HOSPITAL_COMMUNITY)
Admission: EM | Admit: 2019-03-09 | Discharge: 2019-03-09 | Disposition: A | Discharge status: Home / Self Care | Payer: Medicare Other | Attending: Emergency Medicine | Admitting: Emergency Medicine

## 2019-03-09 ENCOUNTER — Encounter (HOSPITAL_COMMUNITY): Payer: Self-pay | Admitting: Emergency Medicine

## 2019-03-09 DIAGNOSIS — L249 Irritant contact dermatitis, unspecified cause: Secondary | ICD-10-CM | POA: Diagnosis not present

## 2019-03-09 MED ORDER — METHYLPREDNISOLONE SODIUM SUCC 125 MG IJ SOLR
INTRAMUSCULAR | Status: AC
  Filled 2019-03-09: qty 2

## 2019-03-09 MED ORDER — PREDNISONE 10 MG (21) PO TBPK: ORAL_TABLET | Freq: Every day | ORAL | 0 refills | Status: DC

## 2019-03-09 MED ORDER — METHYLPREDNISOLONE SODIUM SUCC 125 MG IJ SOLR
125.0000 mg | Freq: Once | INTRAMUSCULAR | Status: AC
  Administered 2019-03-09: 125 mg via INTRAMUSCULAR

## 2019-03-09 MED ORDER — TRIAMCINOLONE ACETONIDE 0.1 % EX CREA: 1.0000 "application " | TOPICAL_CREAM | Freq: Two times a day (BID) | CUTANEOUS | 0 refills | Status: AC

## 2019-03-09 NOTE — Discharge Instructions (Signed)
We gave you a shot of solumedrol today Begin steroid taper-begin taking 6 tablets beginning tomorrow, decrease by 1 tablet each day until you take 1 tablet on day 6 Continue antihistamines, continue daily loratadine, may supplement with Benadryl Triamcinolone as needed for areas of extreme itching, no more than twice daily  Follow-up if not resolving or worsening

## 2019-03-09 NOTE — ED Provider Notes (Signed)
[facility]    CSN: [id number] Arrival date & time: 03/09/19  1209      History   Chief Complaint Chief Complaint  Patient presents with  . Rash    poison ivy    HPI Michelle Cohen is a 68 y.o. female history of B-cell lymphoma status post splenectomy, presenting today for evaluation of poison ivy.  Patient states that she has developed a rash to her right forearm which was first noticed today.  She states that she has been working in her yard and knows that her neighbors has poison ivy.  They often blow the poison ivy into her yard.  Feels very similar to when she has had in the past.  Denies involvement of face.  Denies shortness of breath or chest pain.  Denies difficulty breathing or throat swelling.  Requesting IM injection along with Kenalog.  She has not taken anything today for her symptoms.  HPI  Past Medical History:  Diagnosis Date  . B-cell lymphoma ([place]) 06/2017    Patient Active Problem List   Diagnosis Date Noted  . Counseling regarding advanced care planning and goals of care 07/31/2017  . 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 left inguinal hernia repair Left   . SPLENECTOMY, TOTAL  02/13/2018    OB History   No obstetric history on file.      Home Medications    Prior to Admission medications   Medication Sig Start Date End Date Taking? Authorizing Provider  acetaminophen (TYLENOL) 500 MG tablet Take 500 mg by mouth every 8 (eight) hours as needed for moderate pain.     [provider]  acyclovir (ZOVIRAX) 200 MG capsule TAKE 2 CAPSULES BY MOUTH TWICE DAILY 12/18/18   Brunetta Genera, MD  albuterol (PROVENTIL HFA;VENTOLIN HFA) 108 (90 Base) MCG/ACT inhaler Inhale 1-2 puffs into the lungs every 6 (six) hours as needed for wheezing or shortness of breath. 10/09/18   Melynda Ripple, MD  benzonatate (TESSALON) 200 MG capsule Take 1 capsule (200 mg total)  by mouth 3 (three) times daily as needed for cough. 10/09/18   Melynda Ripple, MD  chlorhexidine (PERIDEX) 0.12 % solution SWISH WITH 15 MLS IN MOUTH OR THROAT THEN SPIT --USE TWICE DAILY AS DIRECTED 12/18/18   Brunetta Genera, MD  chlorpheniramine-HYDROcodone [facility]) 10-8 MG/5ML SUER Take 5 mLs by mouth every 12 (twelve) hours as needed for cough. 10/09/18   Melynda Ripple, MD  docusate sodium (COLACE) 100 MG capsule Take 100 mg by mouth 2 (two) times daily.    [provider]  fluticasone (FLONASE) 50 MCG/ACT nasal spray Place 2 sprays into both nostrils daily. 10/09/18   Melynda Ripple, MD  gabapentin (NEURONTIN) 100 MG capsule 1 cap (100mg ) po twice daily (with breakfast and lunch) and 3 caps (300mg ) po at bedtime 05/21/18   Brunetta Genera, MD  hydrOXYzine (ATARAX/VISTARIL) 25 MG tablet Take 1-2 tablets (25-50 mg total) by mouth every 4 (four) hours as needed. 03/30/18   Raylene Everts, MD  Loratadine 10 MG CAPS Take 10 mg by mouth daily.    [provider]  magic mouthwash w/lidocaine SOLN Take 5 mLs by mouth 4 (four) times daily as needed for mouth pain. Swish and spit 1 Part viscous lidocaine 2% 1 Part Maalox (do not substitute Kaopectate) 1 Part diphenhydramine 12.5 mg per 5 ml elixir. 09/03/18   Brunetta Genera, MD  predniSONE (STERAPRED UNI-PAK 21 TAB) 10 MG (21) TBPK tablet Take by mouth daily. Take as prescribed starting with 6 tabs on day 1, decrease by 1 tab daily until you take 1 tab on day 6 03/09/19   Alwin Lanigan, [employer] C, PA-C  senna (SENOKOT) 8.6 MG TABS tablet Take 1 tablet by mouth daily as needed for mild constipation.     [provider]  Spacer/Aero-Holding Chambers (AEROCHAMBER PLUS) inhaler Use as instructed 10/09/18   Melynda Ripple, MD  triamcinolone cream (KENALOG) 0.1 % Apply 1 application topically 2 (two) times daily. 03/09/19   Viren Lebeau, Elesa Hacker, PA-C    Family History Family History  Problem Relation  Age of Onset  . Lung cancer Sister     Social History Social History   Tobacco Use  . Smoking status: Never Smoker  . Smokeless tobacco: Never Used  Substance Use Topics  . Alcohol use: Yes    Comment: glass of wine occasionally  . Drug use: No     Allergies   Patient has no known allergies.   Review of Systems Review of Systems  Constitutional: Negative for fatigue and fever.  HENT: Negative for mouth sores.   Eyes: Negative for visual disturbance.  Respiratory: Negative for shortness of breath.   Cardiovascular: Negative for chest pain.  Gastrointestinal: Negative for abdominal pain, nausea and vomiting.  Genitourinary: Negative for genital sores.  Musculoskeletal: Negative for arthralgias and joint swelling.  Skin: Positive for color change and rash. Negative for wound.  Neurological: Negative for dizziness, weakness, light-headedness and headaches.     Physical Exam Triage Vital Signs ED Triage Vitals  Enc Vitals Group     BP 03/09/19 1247 (!) 146/82     Pulse Rate 03/09/19 1247 64     Resp 03/09/19 1247 16     Temp 03/09/19 1247 98.6 F (37 C)     Temp Source 03/09/19 1247 Oral     SpO2 03/09/19 1247 96 %     Weight --      Height --      Head Circumference --      Peak Flow --      Pain Score 03/09/19 1245 0     Pain Loc --      Pain Edu? --      Excl. in [place]? --    No data found.  Updated Vital Signs BP (!) 146/82 (BP Location: Right Arm)   Pulse 64   Temp 98.6 F (37 C) (Oral)   Resp 16   SpO2 96%   Visual Acuity Right Eye Distance:   Left Eye Distance:   Bilateral Distance:    Right Eye Near:   Left Eye Near:    Bilateral Near:     Physical Exam Vitals signs and nursing note reviewed.  Constitutional:      Appearance: She is well-developed.     Comments: No acute distress  HENT:     Head: Normocephalic and atraumatic.     Nose: Nose normal.     Mouth/Throat:     Comments: Oral mucosa pink and moist, no tonsillar enlargement  or exudate. Posterior pharynx patent and nonerythematous, no uvula deviation or swelling. Normal phonation.  No lesions on oral mucosa Eyes:     Conjunctiva/sclera: Conjunctivae normal.  Neck:     Musculoskeletal: Neck supple.  Cardiovascular:     Rate and Rhythm: Normal rate.  Pulmonary:     Effort: Pulmonary effort is normal. No respiratory distress.  Comments: Breathing comfortably at rest, CTABL, no wheezing, rales or other adventitious sounds auscultated  Speaking in full sentences Abdominal:     General: There is no distension.  Musculoskeletal: Normal range of motion.  Skin:    General: Skin is warm and dry.     Comments: Right forearm with diffuse erythematous rash, left forearm with various erythematous papules, no vesicles noted  No rash noted to face or elsewhere on body  Neurological:     Mental Status: She is alert and oriented to person, place, and time.      UC Treatments / Results  Labs (all labs ordered are listed, but only abnormal results are displayed) Labs Reviewed - No data to display  EKG None  Radiology No results found.  Procedures Procedures (including critical care time)  Medications Ordered in UC Medications  methylPREDNISolone sodium succinate (SOLU-MEDROL) 125 mg/2 mL injection 125 mg (125 mg Intramuscular Given 03/09/19 1325)  methylPREDNISolone sodium succinate (SOLU-MEDROL) 125 mg/2 mL injection (has no administration in time range)    Initial Impression / Assessment and Plan / UC Course  I have reviewed the triage vital signs and the nursing notes.  Pertinent labs & imaging results that were available during my care of the patient were reviewed by me and considered in my medical decision making (see chart for details).     Patient does appear to have a contact dermatitis, likely secondary to poison ivy.  Will treat with steroids, Solu-Medrol IM prior to discharge, will continue on prednisone taper over the next 6 days.   Antihistamines, Kenalog for significant itching.  Continue to monitor, Discussed strict return precautions. Patient verbalized understanding and is agreeable with plan.  Final Clinical Impressions(s) / UC Diagnoses   Final diagnoses:  Irritant contact dermatitis, unspecified trigger     Discharge Instructions     We gave you a shot of solumedrol today Begin steroid taper-begin taking 6 tablets beginning tomorrow, decrease by 1 tablet each day until you take 1 tablet on day 6 Continue antihistamines, continue daily loratadine, may supplement with Benadryl Triamcinolone as needed for areas of extreme itching, no more than twice daily  Follow-up if not resolving or worsening   ED Prescriptions    Medication Sig Dispense Auth. Provider   predniSONE (STERAPRED UNI-PAK 21 TAB) 10 MG (21) TBPK tablet Take by mouth daily. Take as prescribed starting with 6 tabs on day 1, decrease by 1 tab daily until you take 1 tab on day 6 21 tablet Aliece Honold C, PA-C   triamcinolone cream (KENALOG) 0.1 % Apply 1 application topically 2 (two) times daily. 45 g Sheryl Saintil, [place] C, PA-C     Controlled Substance Prescriptions [place] Controlled Substance Registry consulted? Not Applicable   Janith Lima, [place] 03/09/19 1416

## 2019-03-09 NOTE — ED Triage Notes (Signed)
Per pt she has been in yard working and noticed this morning that she has poison ivy on her right arm. Very itchy. Started today.

## 2019-03-25 ENCOUNTER — Other Ambulatory Visit: Payer: Self-pay | Admitting: Hematology

## 2019-03-25 MED FILL — ACYCLOVIR 200 MG CAP: 200 | 30 days supply | Qty: 120 | Fill #0

## 2019-03-25 MED FILL — CHLORHEXIDINE 0.12% RINSE: 0.12 | 15 days supply | Qty: 473 | Fill #0

## 2019-05-08 NOTE — Progress Notes (Signed)
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 female who has been referred to [place] from [place] [facility] for evaluation and management of B-cell lymphoma. She presents to her appointment today with her partner of 21 years. She states that she moved to the area ~2 years ago secondary to her sister's diagnosis of lung cancer (unfortunately, now passed) and taking care of her mother's Alzheimer's. Generally, prior to this issue she has been very healthy and without chronic medical conditions and not on chronic medical therapies/treatments.   Initially, the patient was admitted to [place] [facility] for abdominal pain and significant hernia. She reports that she was visiting in [place] and while flying there she had an acute onset of sweats and left lower quadrant pain. She reports that her hernia was not present at that time, but as she continued to fly her hernia had become more noticeable and enlarged in her lower abdomen. Additionally, her partner reports that her episode of hydrosis was very significant, stating that it was pooling below her. On their arrival to [facility] on 06/21/17, a CT A/P was performed which showed an incarcerated hernia. Of note, her CT showed massive splenomegaly at 32cm in greatest dimension. Manual reduction of her hernia was attempted while in the ED but was unsuccessful. Pre-operative notes state that following intubation manual reduction was performed successfully and hernia was repaired laparoscopically. CT CAP was performed as well which was without the presence of additional surrounding LAD. She did have flow cytometry performed during her admission as well which showed B-cell lymphocytes which were monoclonal.  A bone marrow biopsy was also examined which showed that it was markedly hypercellular (~95%) with prominent interstitial infiltrate of small lymphocytes.   Prior to her diagnosis, she mostly felt typically well overall. She states that she did suffer from insomnia which she related to over-thinking at the time regarding her familial situation. She did experience some fatigue and weight loss with this as well. Her partner reports that she noticed her weight loss approximately 73moago, which she quantifies at approximately 30lbs since onset. Towards the end of this six month periods and just prior to the appearance of her hernia the patient began to notice some bothersome abdominal distension. She also noticed some significant fatigue and she would nap for longer periods following her daily workouts. Additionally, her partner would notice that she would have issues with breathing and this appeared irregular and more labored, especially at night while laying down. She also mentions that she did experience intermittent night sweats throughout this six month period as well. She denies lightheadedness, dizziness, abdominal pain, back pain, fever, chills, abnormal bruising/bleeding, bloody stools, epistaxis, hematuria, gingival bleeding, or other bleeding issues within this period. She has not received any blood transfusions and has not previously had a tattoo; she is unconcerned with prior HepC exposure.   Since her hernia surgery, she reports that she feels great symptomatically overall and much improved. She does have some issues with abdominal bloating and discomfort, but this has been manageable. No overt abdominal pain.   Throughout all of this, she has attempted to try to remain within good spirits. Losing her sister recently has been hard, but she has been working  through it with her family; however, her partner voices that they do not have strong social support in this area as most of their support is in  [place] where they previously lived. She has been working through her sister's death with grief counseling which has been helping her.    She has never previously smoked, but she reports some moderate second-hand smoke through her father at a younger age. Her father did die of a PE, but she is unaware of any other diagnoses of clotting disorders or cancers within her family. No h/o alcohol abuse, chemical/radiation exposures. She worked previously in Industrial/product designer.   PREVIOUS TREATMENT:  Rituximab weekly x4 beginning on 07/21/17-08/11/17 Received additional Rituxan weekly x 4 doses Rituximab  Weekly for 4 weeks starting 10/05/17 - completed 10/2017.   CURRENT TREATMENT:  S/p splenectomy at [place] with Dr Shon Hough.   INTERIM HISTORY:   [name] returns today for scheduled follow up of her SMZL. The patient's last visit with [place] was on 02/06/2019. The pt reports that she is doing well overall.  The pt reports no acute new concerns. No fevers, chills,night sweats or weight loss.  Lab results today (05/08/19) reviewed with patient.   MEDICAL HISTORY:  Past Medical History:  Diagnosis Date  . B-cell lymphoma ([place]) 06/2017     SURGICAL HISTORY: Past Surgical History:  Procedure Laterality Date  . left inguinal hernia repair Left   . SPLENECTOMY, TOTAL  02/13/2018    SOCIAL HISTORY: Social History   Socioeconomic History  . Marital status: Single    Spouse name: Not on file  . Number of children: Not on file  . Years of education: Not on file  . Highest education level: Not on file  Occupational History  . Not on file  Social Needs  . Financial resource strain: Not on file  . Food insecurity    Worry: Not on file    Inability: Not on file  . Transportation needs    Medical: Not on file    Non-medical: Not on file  Tobacco Use  . Smoking status: Never Smoker  . Smokeless tobacco: Never Used  Substance and Sexual Activity  . Alcohol use: Yes     Comment: glass of wine occasionally  . Drug use: No  . Sexual activity: Not on file  Lifestyle  . Physical activity    Days per week: Not on file    Minutes per session: Not on file  . Stress: Not on file  Relationships  . Social Herbalist on phone: Not on file    Gets together: Not on file    Attends religious service: Not on file    Active member of club or organization: Not on file    Attends meetings of clubs or organizations: Not on file    Relationship status: Not on file  . Intimate partner violence    Fear of current or ex partner: Not on file    Emotionally abused: Not on file    Physically abused: Not on file    Forced sexual activity: Not on file  Other Topics Concern  . Not on file  Social History Narrative  . Not on file    FAMILY HISTORY: Sister - lung cancer Mother -Alzheimers dementia  ALLERGIES:  has No Known Allergies.  MEDICATIONS:  Current Outpatient Medications  Medication Sig Dispense Refill  . acetaminophen (TYLENOL) 500 MG tablet Take 500 mg by mouth every 8 (  eight) hours as needed for moderate pain.     Marland Kitchen acyclovir (ZOVIRAX) 200 MG capsule TAKE 2 CAPSULES BY MOUTH TWICE DAILY 120 capsule 1  . albuterol (PROVENTIL HFA;VENTOLIN HFA) 108 (90 Base) MCG/ACT inhaler Inhale 1-2 puffs into the lungs every 6 (six) hours as needed for wheezing or shortness of breath. 1 Inhaler 0  . benzonatate (TESSALON) 200 MG capsule Take 1 capsule (200 mg total) by mouth 3 (three) times daily as needed for cough. 30 capsule 0  . chlorhexidine (PERIDEX) 0.12 % solution SWISH WITH 15 MLS IN MOUTH OR THROAT THEN SPIT --USE TWICE DAILY AS DIRECTED 473 mL 1  . chlorpheniramine-HYDROcodone (TUSSIONEX PENNKINETIC ER) 10-8 MG/5ML SUER Take 5 mLs by mouth every 12 (twelve) hours as needed for cough. 60 mL 0  . docusate sodium (COLACE) 100 MG capsule Take 100 mg by mouth 2 (two) times daily.    . fluticasone (FLONASE) 50 MCG/ACT nasal spray Place 2 sprays into both  nostrils daily. 16 g 0  . gabapentin (NEURONTIN) 100 MG capsule 1 cap (177m) po twice daily (with breakfast and lunch) and 3 caps (3044m po at bedtime 150 capsule 1  . hydrOXYzine (ATARAX/VISTARIL) 25 MG tablet Take 1-2 tablets (25-50 mg total) by mouth every 4 (four) hours as needed. 20 tablet 0  . Loratadine 10 MG CAPS Take 10 mg by mouth daily.    . magic mouthwash w/lidocaine SOLN Take 5 mLs by mouth 4 (four) times daily as needed for mouth pain. Swish and spit 1 Part viscous lidocaine 2% 1 Part Maalox (do not substitute Kaopectate) 1 Part diphenhydramine 12.5 mg per 5 ml elixir. 120 mL 2  . predniSONE (STERAPRED UNI-PAK 21 TAB) 10 MG (21) TBPK tablet Take by mouth daily. Take as prescribed starting with 6 tabs on day 1, decrease by 1 tab daily until you take 1 tab on day 6 21 tablet 0  . senna (SENOKOT) 8.6 MG TABS tablet Take 1 tablet by mouth daily as needed for mild constipation.     . Marland Kitchenpacer/Aero-Holding Chambers (AEROCHAMBER PLUS) inhaler Use as instructed 1 each 2  . triamcinolone cream (KENALOG) 0.1 % Apply 1 application topically 2 (two) times daily. 45 g 0   No current facility-administered medications for this visit.     REVIEW OF SYSTEMS:   A 10+ POINT REVIEW OF SYSTEMS WAS OBTAINED including neurology, dermatology, psychiatry, cardiac, respiratory, lymph, extremities, GI, GU, Musculoskeletal, constitutional, breasts, reproductive, HEENT.  All pertinent positives are noted in the HPI.  All others are negative.    PHYSICAL EXAMINATION:  ECOG PERFORMANCE STATUS: 1 BP 119/77 (BP Location: Left Arm, Patient Position: Sitting)   Pulse (!) 59   Temp 98.7 F (37.1 C) (Temporal)   Resp 17   Ht '5\' 6"'  (1.676 m)   Wt 161 lb 3 oz (73.1 kg)   SpO2 99%   BMI 26.02 kg/m  . GENERAL:alert, in no acute distress and comfortable SKIN: no acute rashes, no significant lesions EYES: conjunctiva are pink and non-injected, sclera anicteric OROPHARYNX: MMM, no exudates, no oropharyngeal  erythema or ulceration NECK: supple, no JVD LYMPH:  no palpable lymphadenopathy in the cervical, axillary or inguinal regions LUNGS: clear to auscultation b/l with normal respiratory effort HEART: regular rate & rhythm ABDOMEN:  normoactive bowel sounds , non tender, not distended. Extremity: no pedal edema PSYCH: alert & oriented x 3 with fluent speech NEURO: no focal motor/sensory deficits   LABORATORY DATA:   I have reviewed the data as  listed   CBC Latest Ref Rng & Units 05/09/2019 02/06/2019 11/05/2018  WBC 4.0 - 10.5 K/uL 88.2(HH) 66.4(HH) 41.5(H)  Hemoglobin 12.0 - 15.0 g/dL 12.7 13.8 13.8  Hematocrit 36.0 - 46.0 % 39.1 42.8 42.3  Platelets 150 - 400 K/uL 228 293 297  ANC 4.1k . CBC    Component Value Date/Time   WBC 88.2 (HH) 05/09/2019 1321   RBC 3.95 05/09/2019 1321   HGB 12.7 05/09/2019 1321   HGB 8.0 (L) 02/07/2018 0905   HGB 9.1 (L) 09/14/2017 0835   HCT 39.1 05/09/2019 1321   HCT 35.2 03/05/2018 1331   HCT 29.0 (L) 09/14/2017 0835   PLT 228 05/09/2019 1321   PLT 50 (L) 02/07/2018 0905   PLT 46 (L) 09/14/2017 0835   MCV 99.0 05/09/2019 1321   MCV 92.1 09/14/2017 0835   MCH 32.2 05/09/2019 1321   MCHC 32.5 05/09/2019 1321   RDW 14.4 05/09/2019 1321   RDW 18.2 (H) 09/14/2017 0835   LYMPHSABS 79.4 (H) 05/09/2019 1321   LYMPHSABS 2.0 09/14/2017 0835   MONOABS 1.8 (H) 05/09/2019 1321   MONOABS 0.3 09/14/2017 0835   EOSABS 1.8 (H) 05/09/2019 1321   EOSABS 0.0 09/14/2017 0835   BASOSABS 0.9 (H) 05/09/2019 1321   BASOSABS 0.0 09/14/2017 0835    . CMP Latest Ref Rng & Units 05/09/2019 02/06/2019 11/05/2018  Glucose 70 - 99 mg/dL 89 97 97  BUN 8 - 23 mg/dL '15 13 9  ' Creatinine 0.44 - 1.00 mg/dL 0.81 0.81 0.81  Sodium 135 - 145 mmol/L 141 141 140  Potassium 3.5 - 5.1 mmol/L 4.5 4.1 4.6  Chloride 98 - 111 mmol/L 107 107 108  CO2 22 - 32 mmol/L '25 24 26  ' Calcium 8.9 - 10.3 mg/dL 9.3 9.0 9.3  Total Protein 6.5 - 8.1 g/dL 6.9 7.3 6.9  Total Bilirubin 0.3 - 1.2  mg/dL 1.4(H) 1.1 1.6(H)  Alkaline Phos 38 - 126 U/L 66 71 69  AST 15 - 41 U/L '23 21 23  ' ALT 0 - 44 U/L '22 23 25   ' Component     Latest Ref Rng & Units 07/13/2017  LDH     125 - 245 U/L 232  Hep C Virus Ab     0.0 - 0.9 s/co ratio <0.1  HIV Screen 4th Generation wRfx     Non Reactive Non Reactive  Hep B Core Ab, Tot     Negative Negative  Hepatitis B Surface Ag     Negative Negative   Component     Latest Ref Rng & Units 07/28/2017  LDH     125 - 245 U/L 200  Uric Acid, Serum     2.6 - 7.4 mg/dl 6.0  Phosphorus     2.5 - 4.5 mg/dL 3.8   03/05/18 Pathology Report Splenectomy:      RADIOGRAPHIC STUDIES: I have personally reviewed the radiological images as listed and agreed with the findings in the report. No results found.   ASSESSMENT & PLAN:   Latrena Benegas is a wonderful 69 y.o. 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  Her clinical presentation is also representative  of this and that this may have been present over months to possibly years.  -With her pattern of involvement and results thus far it is likely that she has splenic marginal zone type lymphoma.  -PET scan on 07/25/2017 with results showing: Massive splenomegaly, splenic volume 5770 cubic cm, with homogeneous low grade activity throughout the spleen equal to that of the physiologic activity in the liver, and above the background blood pool activity in the mediastinum. No pathologic adenopathy identified. No bony involvement noted.   -CT A/p on 08/23/17 with results of: Marked splenomegaly without substantial interval change in  splenic size/volume. Borderline lymphadenopathy in the hepatoduodenal ligament. Aortic Atherosclerosis. I discussed this with the patient in great detail.  -CT A/p on 09/15/17 shows significant splenomegaly shows minimal smaller change in size, with no evidence of bowel obstruction.   2. Pancytopenia s/p thrombocytopenia and anemia  Due to SMZL and hypersplenism Now resolved  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 Had to complete Rituxan at lower rate. Patient would not be a candidate for rapid infusion protocol and would need to keep the next infusion at the tolerated rate and not rate escalate. Plan  Completed with extensive pre-medications.  4. Oral mucositis/HSV outbreak - resolved -continue with acyclovir as needed   PLAN: Labs reviewed with patient. WBC counts increased to 88k, no anemia no thrombocytopenia. No fevers/chills/nightsweats or weight loss. No strong indication for starting the patient on treatment at this time. Will continue to monitor with labs and H&P  In 3 months   FOLLOW UP:  RTC with Dr Irene Limbo with labs in 3 months    The total time spent in the appt was 15 minutes and more than 50% was on counseling and direct patient cares.    Sullivan Lone MD MS AAHIVMS [facility] Hematology/Oncology Physician [facility]  (Office):       [phone] (Work cell):  [phone] (Fax):           [phone]  I, Jacqualyn Posey, am acting as a scribe for Dr. Sullivan Lone.   .I have reviewed the above documentation for accuracy and completeness, and I agree with the above. Brunetta Genera MD

## 2019-05-09 ENCOUNTER — Other Ambulatory Visit: Payer: Self-pay

## 2019-05-09 ENCOUNTER — Inpatient Hospital Stay (HOSPITAL_BASED_OUTPATIENT_CLINIC_OR_DEPARTMENT_OTHER): Payer: Medicare Other | Admitting: Hematology

## 2019-05-09 ENCOUNTER — Inpatient Hospital Stay: Payer: Medicare Other | Attending: Hematology

## 2019-05-09 VITALS — BP 119/77 | HR 59 | Temp 98.7°F | Resp 17 | Ht 66.0 in | Wt 161.2 lb

## 2019-05-09 DIAGNOSIS — C8307 Small cell B-cell lymphoma, spleen: Secondary | ICD-10-CM

## 2019-05-09 DIAGNOSIS — Z79899 Other long term (current) drug therapy: Secondary | ICD-10-CM | POA: Diagnosis not present

## 2019-05-09 LAB — CBC WITH DIFFERENTIAL/PLATELET
Abs Immature Granulocytes: 0 10*3/uL (ref 0.00–0.07)
Basophils Absolute: 0.9 10*3/uL — ABNORMAL HIGH (ref 0.0–0.1)
Basophils Relative: 1 %
Eosinophils Absolute: 1.8 10*3/uL — ABNORMAL HIGH (ref 0.0–0.5)
Eosinophils Relative: 2 %
HCT: 39.1 % (ref 36.0–46.0)
Hemoglobin: 12.7 g/dL (ref 12.0–15.0)
Lymphocytes Relative: 90 %
Lymphs Abs: 79.4 10*3/uL — ABNORMAL HIGH (ref 0.7–4.0)
MCH: 32.2 pg (ref 26.0–34.0)
MCHC: 32.5 g/dL (ref 30.0–36.0)
MCV: 99 fL (ref 80.0–100.0)
Monocytes Absolute: 1.8 10*3/uL — ABNORMAL HIGH (ref 0.1–1.0)
Monocytes Relative: 2 %
Neutro Abs: 4.4 10*3/uL (ref 1.7–17.7)
Neutrophils Relative %: 5 %
Platelets: 228 10*3/uL (ref 150–400)
RBC: 3.95 MIL/uL (ref 3.87–5.11)
RDW: 14.4 % (ref 11.5–15.5)
WBC: 88.2 10*3/uL (ref 4.0–10.5)
nRBC: 0 % (ref 0.0–0.2)

## 2019-05-09 LAB — CMP (CANCER CENTER ONLY)
ALT: 22 U/L (ref 0–44)
AST: 23 U/L (ref 15–41)
Albumin: 3.9 g/dL (ref 3.5–5.0)
Alkaline Phosphatase: 66 U/L (ref 38–126)
Anion gap: 9 (ref 5–15)
BUN: 15 mg/dL (ref 8–23)
CO2: 25 mmol/L (ref 22–32)
Calcium: 9.3 mg/dL (ref 8.9–10.3)
Chloride: 107 mmol/L (ref 98–111)
Creatinine: 0.81 mg/dL (ref 0.44–1.00)
GFR, Est AFR Am: >60 mL/min (ref >60)
GFR, Estimated: >60 mL/min (ref >60)
Glucose, Bld: 89 mg/dL (ref 70–99)
Potassium: 4.5 mmol/L (ref 3.5–5.1)
Sodium: 141 mmol/L (ref 135–145)
Total Bilirubin: 1.4 mg/dL — ABNORMAL HIGH (ref 0.3–1.2)
Total Protein: 6.9 g/dL (ref 6.5–8.1)

## 2019-05-09 LAB — LACTATE DEHYDROGENASE: LDH: 258 U/L — ABNORMAL HIGH (ref 98–192)

## 2019-05-17 MED FILL — CHLORHEXIDINE 0.12% RINSE: 0.12 | 15 days supply | Qty: 473 | Fill #1

## 2019-05-17 MED FILL — ACYCLOVIR 200 MG CAP: 200 | 30 days supply | Qty: 120 | Fill #1

## 2019-06-05 ENCOUNTER — Encounter: Payer: Self-pay | Admitting: Hematology

## 2019-06-05 DIAGNOSIS — Z23 Encounter for immunization: Secondary | ICD-10-CM | POA: Diagnosis not present

## 2019-07-05 ENCOUNTER — Telehealth: Payer: Self-pay | Admitting: *Deleted

## 2019-07-05 DIAGNOSIS — H2511 Age-related nuclear cataract, right eye: Secondary | ICD-10-CM | POA: Diagnosis not present

## 2019-07-05 NOTE — Telephone Encounter (Signed)
Received vm message from patient.  She states she saw her eye doctor today and has cataracts.  She needs to have cataract surgery but wants to make sure Dr. Irene Limbo is ok with this.  Please advise

## 2019-07-07 ENCOUNTER — Encounter: Payer: Self-pay | Admitting: Hematology

## 2019-07-09 ENCOUNTER — Other Ambulatory Visit: Payer: Self-pay | Admitting: Hematology

## 2019-07-09 MED FILL — CHLORHEXIDINE 0.12% RINSE: 0.12 | 15 days supply | Qty: 473 | Fill #0

## 2019-07-09 MED FILL — ACYCLOVIR 200 MG CAP: 200 | 30 days supply | Qty: 120 | Fill #0

## 2019-07-10 ENCOUNTER — Telehealth: Payer: Self-pay | Admitting: *Deleted

## 2019-07-10 NOTE — Telephone Encounter (Signed)
Faxed refill request recv'd for Acyclovir and Chlorhexidine - MD refilled on 07/08/13

## 2019-07-10 NOTE — Telephone Encounter (Signed)
Contacted Michelle Cohen in response to question in MyChart regarding Dr.Kale's approval for cataract surgery.  Advised her that once she knows which opthamologist will perform procedure, she can inform their office to fax Dr. Irene Limbo a medical clearance form with procedure information/date for review and signature. Gave office fax# [phone]. She verbalized understanding.

## 2019-07-11 ENCOUNTER — Encounter: Payer: Self-pay | Admitting: Hematology

## 2019-07-12 NOTE — Telephone Encounter (Signed)
Attempted to contact patient with Dr.Kale's response - LVMM: Generally, cataract surgery should be ok. She has a slightly higher risk of infection due to her condition and she would want to keep this in mind this moving forward. Once the opthamologist has determined when the surgery will take place, they can send a 'Request for Medical Clearance' form to Dr. Irene Limbo for his review and signature. Encouraged patient to contact office for any questions.

## 2019-07-30 DIAGNOSIS — H2513 Age-related nuclear cataract, bilateral: Secondary | ICD-10-CM | POA: Diagnosis not present

## 2019-08-02 ENCOUNTER — Encounter: Payer: Self-pay | Admitting: Hematology

## 2019-08-02 DIAGNOSIS — H4423 Degenerative myopia, bilateral: Secondary | ICD-10-CM | POA: Diagnosis not present

## 2019-08-02 DIAGNOSIS — H2521 Age-related cataract, morgagnian type, right eye: Secondary | ICD-10-CM | POA: Diagnosis not present

## 2019-08-02 DIAGNOSIS — H5213 Myopia, bilateral: Secondary | ICD-10-CM | POA: Diagnosis not present

## 2019-08-02 DIAGNOSIS — H18603 Keratoconus, unspecified, bilateral: Secondary | ICD-10-CM | POA: Diagnosis not present

## 2019-08-04 ENCOUNTER — Encounter: Payer: Self-pay | Admitting: Hematology

## 2019-08-06 DIAGNOSIS — H2589 Other age-related cataract: Secondary | ICD-10-CM | POA: Diagnosis not present

## 2019-08-06 DIAGNOSIS — H2521 Age-related cataract, morgagnian type, right eye: Secondary | ICD-10-CM | POA: Diagnosis not present

## 2019-08-12 ENCOUNTER — Inpatient Hospital Stay (HOSPITAL_BASED_OUTPATIENT_CLINIC_OR_DEPARTMENT_OTHER): Payer: Medicare Other | Admitting: Hematology

## 2019-08-12 ENCOUNTER — Inpatient Hospital Stay: Payer: Medicare Other | Attending: Hematology

## 2019-08-12 ENCOUNTER — Other Ambulatory Visit: Payer: Self-pay

## 2019-08-12 ENCOUNTER — Telehealth: Payer: Self-pay | Admitting: Hematology

## 2019-08-12 VITALS — BP 132/88 | HR 65 | Temp 98.5°F | Resp 18 | Ht 66.0 in | Wt 159.9 lb

## 2019-08-12 DIAGNOSIS — D61818 Other pancytopenia: Secondary | ICD-10-CM | POA: Diagnosis not present

## 2019-08-12 DIAGNOSIS — K1230 Oral mucositis (ulcerative), unspecified: Secondary | ICD-10-CM | POA: Insufficient documentation

## 2019-08-12 DIAGNOSIS — Z79899 Other long term (current) drug therapy: Secondary | ICD-10-CM | POA: Insufficient documentation

## 2019-08-12 DIAGNOSIS — C8307 Small cell B-cell lymphoma, spleen: Secondary | ICD-10-CM | POA: Insufficient documentation

## 2019-08-12 LAB — CBC WITH DIFFERENTIAL/PLATELET
Abs Immature Granulocytes: 0 10*3/uL (ref 0.00–0.07)
Basophils Absolute: 0 10*3/uL (ref 0.0–0.1)
Basophils Relative: 0 %
Eosinophils Absolute: 2.4 10*3/uL — ABNORMAL HIGH (ref 0.0–0.5)
Eosinophils Relative: 2 %
HCT: 40 % (ref 36.0–46.0)
Hemoglobin: 12.7 g/dL (ref 12.0–15.0)
Lymphocytes Relative: 91 %
Lymphs Abs: 110.1 10*3/uL — ABNORMAL HIGH (ref 0.7–4.0)
MCH: 31.6 pg (ref 26.0–34.0)
MCHC: 31.8 g/dL (ref 30.0–36.0)
MCV: 99.5 fL (ref 80.0–100.0)
Monocytes Absolute: 3.6 10*3/uL — ABNORMAL HIGH (ref 0.1–1.0)
Monocytes Relative: 3 %
Neutro Abs: 4.8 10*3/uL (ref 1.7–7.7)
Neutrophils Relative %: 4 %
Platelets: 223 10*3/uL (ref 150–400)
RBC: 4.02 MIL/uL (ref 3.87–5.11)
RDW: 14.6 % (ref 11.5–15.5)
WBC: 121 10*3/uL (ref 4.0–10.5)
nRBC: 0 % (ref 0.0–0.2)

## 2019-08-12 LAB — CMP (CANCER CENTER ONLY)
ALT: 17 U/L (ref 0–44)
AST: 19 U/L (ref 15–41)
Albumin: 4.1 g/dL (ref 3.5–5.0)
Alkaline Phosphatase: 64 U/L (ref 38–126)
Anion gap: 13 (ref 5–15)
BUN: 12 mg/dL (ref 8–23)
CO2: 24 mmol/L (ref 22–32)
Calcium: 8.9 mg/dL (ref 8.9–10.3)
Chloride: 105 mmol/L (ref 98–111)
Creatinine: 0.8 mg/dL (ref 0.44–1.00)
GFR, Est AFR Am: >60 mL/min (ref >60)
GFR, Estimated: >60 mL/min (ref >60)
Glucose, Bld: 95 mg/dL (ref 70–99)
Potassium: 4.1 mmol/L (ref 3.5–5.1)
Sodium: 142 mmol/L (ref 135–145)
Total Bilirubin: 1.2 mg/dL (ref 0.3–1.2)
Total Protein: 7.2 g/dL (ref 6.5–8.1)

## 2019-08-12 LAB — LACTATE DEHYDROGENASE: LDH: 220 U/L — ABNORMAL HIGH (ref 98–192)

## 2019-08-12 NOTE — Telephone Encounter (Signed)
Scheduled appt per 11/16 los. ° °Printed calendar and avs. °

## 2019-08-12 NOTE — Progress Notes (Signed)
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 female who has been referred to [place] from [facility] for evaluation and management of B-cell lymphoma. She presents to her appointment today with her partner of 21 years. She states that she moved to the area ~2 years ago secondary to her sister's diagnosis of lung cancer (unfortunately, now passed) and taking care of her mother's Alzheimer's. Generally, prior to this issue she has been very healthy and without chronic medical conditions and not on chronic medical therapies/treatments.   Initially, the patient was admitted to [facility] for abdominal pain and significant hernia. She reports that she was visiting in [place] and while flying there she had an acute onset of sweats and left lower quadrant pain. She reports that her hernia was not present at that time, but as she continued to fly her hernia had become more noticeable and enlarged in her lower abdomen. Additionally, her partner reports that her episode of hydrosis was very significant, stating that it was pooling below her. On their arrival to [facility] on 06/21/17, a CT A/P was performed which showed an incarcerated hernia. Of note, her CT showed massive splenomegaly at 32cm in greatest dimension. Manual reduction of her hernia was attempted while in the ED but was unsuccessful. Pre-operative notes state that following intubation manual reduction was performed successfully and hernia was repaired laparoscopically. CT CAP was performed as well which was without the presence of additional surrounding LAD. She did have flow cytometry performed during her admission as well which showed B-cell lymphocytes which were monoclonal.  A bone marrow biopsy was also examined which showed that it was markedly hypercellular (~95%) with prominent interstitial infiltrate of small lymphocytes.   Prior to her diagnosis, she mostly felt typically well overall. She states that she did suffer from insomnia which she related to over-thinking at the time regarding her familial situation. She did experience some fatigue and weight loss with this as well. Her partner reports that she noticed her weight loss approximately 35moago, which she quantifies at approximately 30lbs since onset. Towards the end of this six month periods and just prior to the appearance of her hernia the patient began to notice some bothersome abdominal distension. She also noticed some significant fatigue and she would nap for longer periods following her daily workouts. Additionally, her partner would notice that she would have issues with breathing and this appeared irregular and more labored, especially at night while laying down. She also mentions that she did experience intermittent night sweats throughout this six month period as well. She denies lightheadedness, dizziness, abdominal pain, back pain, fever, chills, abnormal bruising/bleeding, bloody stools, epistaxis, hematuria, gingival bleeding, or other bleeding issues within this period. She has not received any blood transfusions and has not previously had a tattoo; she is unconcerned with prior HepC exposure.   Since her hernia surgery, she reports that she feels great symptomatically overall and much improved. She does have some issues with abdominal bloating and discomfort, but this has been manageable. No overt abdominal pain.   Throughout all of this, she has attempted to try to remain within good spirits. Losing her sister recently has been hard, but she has been working  through it with her family; however, her partner voices that they do not have strong social support in this area as most of their support is in  [place] where they previously lived. She has been working through her sister's death with grief counseling which has been helping her.    She has never previously smoked, but she reports some moderate second-hand smoke through her father at a younger age. Her father did die of a PE, but she is unaware of any other diagnoses of clotting disorders or cancers within her family. No h/o alcohol abuse, chemical/radiation exposures. She worked previously in Industrial/product designer.   PREVIOUS TREATMENT:  Rituximab weekly x4 beginning on 07/21/17-08/11/17 Received additional Rituxan weekly x 4 doses Rituximab  Weekly for 4 weeks starting 10/05/17 - completed 10/2017.   CURRENT TREATMENT:  S/p splenectomy at [facility] with Dr Shon Hough.   INTERIM HISTORY:   Michelle Cohen returns today for scheduled follow up of her SMZL. The patient's last visit with [place] was on 05/09/2019. The pt reports that she is doing well overall.  The pt reports that she has recently had her right cataract surgery and has already noticed a large improvement. She was checked for a mass or tumor behind the eye and nothing was found. They are not sure why she her cataract was so aggressive. She does have a left cataract as well and plans on having cataract surgery on that eye once she is healed. Pt feels good and was walking a fair amount before surgery. After her eyes heal from her surgeries she plans on returning to her activity. She and her wife have been staying safe by socially distancing themselves from others and plan to continue to do so. She has had her annual flu shot.   Lab results today (08/12/19) of CBC w/diff and CMP is as follows: all values are WNL except for WBC at 121K, Lymphs Abs at 110.1K, Mono Abs at 3.6K, Eos Abs at 2.4K, Smear Review shows "Variant Lymphocytes Seen", Smudge Cells are "Present".  08/12/2019 LDH at 220   On review of systems, pt denies night sweats, fevers, chills, unexpected weight loss,  abdominal pain, rashes, abdominal pain, bowel movement issues and any other symptoms.   MEDICAL HISTORY:  Past Medical History:  Diagnosis Date  . B-cell lymphoma ([place]) 06/2017     SURGICAL HISTORY: Past Surgical History:  Procedure Laterality Date  . left inguinal hernia repair Left   . SPLENECTOMY, TOTAL  02/13/2018    SOCIAL HISTORY: Social History   Socioeconomic History  . Marital status: Single    Spouse name: Not on file  . Number of children: Not on file  . Years of education: Not on file  . Highest education level: Not on file  Occupational History  . Not on file  Social Needs  . Financial resource strain: Not on file  . Food insecurity    Worry: Not on file    Inability: Not on file  . Transportation needs    Medical: Not on file    Non-medical: Not on file  Tobacco Use  . Smoking status: Never Smoker  . Smokeless tobacco: Never Used  Substance and Sexual Activity  . Alcohol use: Yes    Comment: glass of wine occasionally  . Drug use: No  . Sexual activity: Not on file  Lifestyle  . Physical activity    Days per week: Not on file    Minutes per session: Not on file  .  Stress: Not on file  Relationships  . Social Herbalist on phone: Not on file    Gets together: Not on file    Attends religious service: Not on file    Active member of club or organization: Not on file    Attends meetings of clubs or organizations: Not on file    Relationship status: Not on file  . Intimate partner violence    Fear of current or ex partner: Not on file    Emotionally abused: Not on file    Physically abused: Not on file    Forced sexual activity: Not on file  Other Topics Concern  . Not on file  Social History Narrative  . Not on file    FAMILY HISTORY: Sister - lung cancer Mother -Alzheimers dementia  ALLERGIES:  has No Known Allergies.  MEDICATIONS:  Current Outpatient Medications  Medication Sig Dispense Refill  . acetaminophen (TYLENOL)  500 MG tablet Take 500 mg by mouth every 8 (eight) hours as needed for moderate pain.     Marland Kitchen acyclovir (ZOVIRAX) 200 MG capsule TAKE 2 CAPSULES BY MOUTH TWICE DAILY 120 capsule 1  . albuterol (PROVENTIL HFA;VENTOLIN HFA) 108 (90 Base) MCG/ACT inhaler Inhale 1-2 puffs into the lungs every 6 (six) hours as needed for wheezing or shortness of breath. 1 Inhaler 0  . benzonatate (TESSALON) 200 MG capsule Take 1 capsule (200 mg total) by mouth 3 (three) times daily as needed for cough. 30 capsule 0  . chlorhexidine (PERIDEX) 0.12 % solution SWISH WITH 15 MLS IN MOUTH OR THROAT THEN SPIT --USE TWICE DAILY AS DIRECTED 473 mL 1  . chlorpheniramine-HYDROcodone (TUSSIONEX PENNKINETIC ER) 10-8 MG/5ML SUER Take 5 mLs by mouth every 12 (twelve) hours as needed for cough. 60 mL 0  . docusate sodium (COLACE) 100 MG capsule Take 100 mg by mouth 2 (two) times daily.    . fluticasone (FLONASE) 50 MCG/ACT nasal spray Place 2 sprays into both nostrils daily. 16 g 0  . gabapentin (NEURONTIN) 100 MG capsule 1 cap ('100mg'$ ) po twice daily (with breakfast and lunch) and 3 caps ('300mg'$ ) po at bedtime 150 capsule 1  . hydrOXYzine (ATARAX/VISTARIL) 25 MG tablet Take 1-2 tablets (25-50 mg total) by mouth every 4 (four) hours as needed. 20 tablet 0  . Loratadine 10 MG CAPS Take 10 mg by mouth daily.    . magic mouthwash w/lidocaine SOLN Take 5 mLs by mouth 4 (four) times daily as needed for mouth pain. Swish and spit 1 Part viscous lidocaine 2% 1 Part Maalox (do not substitute Kaopectate) 1 Part diphenhydramine 12.5 mg per 5 ml elixir. 120 mL 2  . predniSONE (STERAPRED UNI-PAK 21 TAB) 10 MG (21) TBPK tablet Take by mouth daily. Take as prescribed starting with 6 tabs on day 1, decrease by 1 tab daily until you take 1 tab on day 6 21 tablet 0  . senna (SENOKOT) 8.6 MG TABS tablet Take 1 tablet by mouth daily as needed for mild constipation.     Marland Kitchen Spacer/Aero-Holding Chambers (AEROCHAMBER PLUS) inhaler Use as instructed 1 each 2  .  triamcinolone cream (KENALOG) 0.1 % Apply 1 application topically 2 (two) times daily. 45 g 0   No current facility-administered medications for this visit.     REVIEW OF SYSTEMS:   A 10+ POINT REVIEW OF SYSTEMS WAS OBTAINED including neurology, dermatology, psychiatry, cardiac, respiratory, lymph, extremities, GI, GU, Musculoskeletal, constitutional, breasts, reproductive, HEENT.  All pertinent positives are noted  in the HPI.  All others are negative.     PHYSICAL EXAMINATION:  ECOG PERFORMANCE STATUS: 1 BP 132/88 (BP Location: Left Arm, Patient Position: Sitting)   Pulse 65   Temp 98.5 F (36.9 C) (Temporal)   Resp 18   Ht '5\' 6"'$  (1.676 m)   Wt 159 lb 14.4 oz (72.5 kg)   SpO2 99%   BMI 25.81 kg/m  .  GENERAL:alert, in no acute distress and comfortable SKIN: no acute rashes, no significant lesions EYES: conjunctiva are pink and non-injected, sclera anicteric OROPHARYNX: MMM, no exudates, no oropharyngeal erythema or ulceration NECK: supple, no JVD LYMPH:  no palpable lymphadenopathy in the cervical, axillary or inguinal regions LUNGS: clear to auscultation b/l with normal respiratory effort HEART: regular rate & rhythm ABDOMEN:  normoactive bowel sounds , non tender, not distended. No palpable hepatosplenomegaly.  Extremity: no pedal edema PSYCH: alert & oriented x 3 with fluent speech NEURO: no focal motor/sensory deficits  LABORATORY DATA:   I have reviewed the data as listed   CBC Latest Ref Rng & Units 08/12/2019 05/09/2019 02/06/2019  WBC 4.0 - 10.5 K/uL 121.0(HH) 88.2(HH) 66.4(HH)  Hemoglobin 12.0 - 15.0 g/dL 12.7 12.7 13.8  Hematocrit 36.0 - 46.0 % 40.0 39.1 42.8  Platelets 150 - 400 K/uL 223 228 293  ANC 4.1k . CBC    Component Value Date/Time   WBC 121.0 (HH) 08/12/2019 1415   RBC 4.02 08/12/2019 1415   HGB 12.7 08/12/2019 1415   HGB 8.0 (L) 02/07/2018 0905   HGB 9.1 (L) 09/14/2017 0835   HCT 40.0 08/12/2019 1415   HCT 35.2 03/05/2018 1331   HCT 29.0  (L) 09/14/2017 0835   PLT 223 08/12/2019 1415   PLT 50 (L) 02/07/2018 0905   PLT 46 (L) 09/14/2017 0835   MCV 99.5 08/12/2019 1415   MCV 92.1 09/14/2017 0835   MCH 31.6 08/12/2019 1415   MCHC 31.8 08/12/2019 1415   RDW 14.6 08/12/2019 1415   RDW 18.2 (H) 09/14/2017 0835   LYMPHSABS 110.1 (H) 08/12/2019 1415   LYMPHSABS 2.0 09/14/2017 0835   MONOABS 3.6 (H) 08/12/2019 1415   MONOABS 0.3 09/14/2017 0835   EOSABS 2.4 (H) 08/12/2019 1415   EOSABS 0.0 09/14/2017 0835   BASOSABS 0.0 08/12/2019 1415   BASOSABS 0.0 09/14/2017 0835    . CMP Latest Ref Rng & Units 08/12/2019 05/09/2019 02/06/2019  Glucose 70 - 99 mg/dL 95 89 97  BUN 8 - 23 mg/dL '12 15 13  '$ Creatinine 0.44 - 1.00 mg/dL 0.80 0.81 0.81  Sodium 135 - 145 mmol/L 142 141 141  Potassium 3.5 - 5.1 mmol/L 4.1 4.5 4.1  Chloride 98 - 111 mmol/L 105 107 107  CO2 22 - 32 mmol/L '24 25 24  '$ Calcium 8.9 - 10.3 mg/dL 8.9 9.3 9.0  Total Protein 6.5 - 8.1 g/dL 7.2 6.9 7.3  Total Bilirubin 0.3 - 1.2 mg/dL 1.2 1.4(H) 1.1  Alkaline Phos 38 - 126 U/L 64 66 71  AST 15 - 41 U/L '19 23 21  '$ ALT 0 - 44 U/L '17 22 23   '$ Component     Latest Ref Rng & Units 07/13/2017  LDH     125 - 245 U/L 232  Hep C Virus Ab     0.0 - 0.9 s/co ratio <0.1  HIV Screen 4th Generation wRfx     Non Reactive Non Reactive  Hep B Core Ab, Tot     Negative Negative  Hepatitis B Surface Ag  Negative Negative   Component     Latest Ref Rng & Units 07/28/2017  LDH     125 - 245 U/L 200  Uric Acid, Serum     2.6 - 7.4 mg/dl 6.0  Phosphorus     2.5 - 4.5 mg/dL 3.8   03/05/18 Pathology Report Splenectomy:      RADIOGRAPHIC STUDIES: I have personally reviewed the radiological images as listed and agreed with the findings in the report. No results found.   ASSESSMENT & PLAN:   Michelle Cohen is a wonderful 68 y.o. 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  Her clinical presentation is also representative of this and that this may have been present over months to possibly years.  -With her pattern of involvement and results thus far it is likely that she has splenic marginal zone type lymphoma.  -PET scan on 07/25/2017 with results showing: Massive splenomegaly, splenic volume 5770 cubic cm, with homogeneous low grade activity throughout the spleen equal to that of the physiologic activity in the liver, and above the background blood pool activity in the mediastinum. No pathologic adenopathy identified. No bony involvement noted.   -CT A/p on 08/23/17 with results of: Marked splenomegaly without substantial interval change in splenic size/volume. Borderline lymphadenopathy in the hepatoduodenal ligament. Aortic Atherosclerosis. I discussed this with the patient in great detail.  -CT A/p on 09/15/17 shows significant splenomegaly shows minimal smaller change in size, with no evidence of bowel obstruction.   2. Pancytopenia s/p thrombocytopenia and anemia  Due to SMZL and hypersplenism Now resolved  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 Had to complete Rituxan at lower rate. Patient would not be a candidate for rapid infusion protocol and would need to keep the next infusion at the tolerated rate and not rate escalate. Plan  Completed with extensive pre-medications.  4. Oral mucositis/HSV outbreak - resolved -continue with acyclovir as needed   PLAN: -Discussed pt labwork today, 08/12/19; blood counts and chemistries are stable   -Discussed 08/12/2019 LDH is steady at 220  -Pt has had her annual flu shot -No fevers/chills/night sweats or weight loss.  -No strong indication for starting the patient on treatment at this time.  -Will continue to monitor with labs and H&P in 3 months   FOLLOW UP: RTC with Dr Irene Limbo with labs in 3 months  The total time spent in the appt was 25 minutes and more than 50% was on counseling and direct patient cares.  All of the patient's questions were answered with apparent satisfaction. The patient knows to call the clinic with any problems, questions or concerns.   Sullivan Lone MD Tye AAHIVMS [facility] Hematology/Oncology Physician [facility]  (Office):       [phone] (Work cell):  [phone] (Fax):           [phone]  I, Yevette Edwards, am acting as a scribe for Dr. Sullivan Lone.   .I have reviewed the above documentation for accuracy and completeness, and I agree with the above. Brunetta Genera  MD

## 2019-08-13 NOTE — Progress Notes (Signed)
Late entry for 08/12/2019 @ 1435: notified by [facility] WBC 121.0 Dr. Irene Limbo notified 1500 - information acknowleded

## 2019-08-17 MED FILL — ACYCLOVIR 200 MG CAP: 200 | 30 days supply | Qty: 120 | Fill #1

## 2019-09-06 ENCOUNTER — Encounter: Payer: Self-pay | Admitting: Hematology

## 2019-09-10 DIAGNOSIS — H25812 Combined forms of age-related cataract, left eye: Secondary | ICD-10-CM | POA: Diagnosis not present

## 2019-09-28 ENCOUNTER — Other Ambulatory Visit: Payer: Self-pay | Admitting: Hematology

## 2019-09-30 MED FILL — ACYCLOVIR 200 MG CAP: 200 | 30 days supply | Qty: 120 | Fill #0

## 2019-10-23 DIAGNOSIS — H353131 Nonexudative age-related macular degeneration, bilateral, early dry stage: Secondary | ICD-10-CM | POA: Diagnosis not present

## 2019-11-11 ENCOUNTER — Encounter: Payer: Self-pay | Admitting: *Deleted

## 2019-11-11 ENCOUNTER — Telehealth: Payer: Self-pay | Admitting: Hematology

## 2019-11-11 ENCOUNTER — Other Ambulatory Visit: Payer: Self-pay

## 2019-11-11 ENCOUNTER — Inpatient Hospital Stay (HOSPITAL_BASED_OUTPATIENT_CLINIC_OR_DEPARTMENT_OTHER): Payer: Medicare Other | Admitting: Hematology

## 2019-11-11 ENCOUNTER — Inpatient Hospital Stay: Payer: Medicare Other | Attending: Hematology

## 2019-11-11 VITALS — BP 115/67 | HR 64 | Temp 97.8°F | Resp 17 | Ht 66.0 in | Wt 166.1 lb

## 2019-11-11 DIAGNOSIS — C8307 Small cell B-cell lymphoma, spleen: Secondary | ICD-10-CM | POA: Insufficient documentation

## 2019-11-11 DIAGNOSIS — D649 Anemia, unspecified: Secondary | ICD-10-CM | POA: Diagnosis not present

## 2019-11-11 LAB — CMP (CANCER CENTER ONLY)
ALT: 16 U/L (ref 0–44)
AST: 19 U/L (ref 15–41)
Albumin: 3.9 g/dL (ref 3.5–5.0)
Alkaline Phosphatase: 71 U/L (ref 38–126)
Anion gap: 8 (ref 5–15)
BUN: 17 mg/dL (ref 8–23)
CO2: 26 mmol/L (ref 22–32)
Calcium: 8.6 mg/dL — ABNORMAL LOW (ref 8.9–10.3)
Chloride: 107 mmol/L (ref 98–111)
Creatinine: 0.73 mg/dL (ref 0.44–1.00)
GFR, Est AFR Am: >60 mL/min (ref >60)
GFR, Estimated: >60 mL/min (ref >60)
Glucose, Bld: 80 mg/dL (ref 70–99)
Potassium: 4 mmol/L (ref 3.5–5.1)
Sodium: 141 mmol/L (ref 135–145)
Total Bilirubin: 1.1 mg/dL (ref 0.3–1.2)
Total Protein: 7 g/dL (ref 6.5–8.1)

## 2019-11-11 LAB — CBC WITH DIFFERENTIAL/PLATELET
Abs Immature Granulocytes: 0 10*3/uL (ref 0.00–0.07)
Basophils Absolute: 0 10*3/uL (ref 0.0–0.1)
Basophils Relative: 0 %
Eosinophils Absolute: 6.5 10*3/uL — ABNORMAL HIGH (ref 0.0–0.5)
Eosinophils Relative: 3 %
HCT: 34.3 % — ABNORMAL LOW (ref 36.0–46.0)
Hemoglobin: 10.9 g/dL — ABNORMAL LOW (ref 12.0–15.0)
Lymphocytes Relative: 89 %
Lymphs Abs: 191.4 10*3/uL — ABNORMAL HIGH (ref 0.7–4.0)
MCH: 32.5 pg (ref 26.0–34.0)
MCHC: 31.8 g/dL (ref 30.0–36.0)
MCV: 102.4 fL — ABNORMAL HIGH (ref 80.0–100.0)
Monocytes Absolute: 8.6 10*3/uL — ABNORMAL HIGH (ref 0.1–1.0)
Monocytes Relative: 4 %
Neutro Abs: 8.6 10*3/uL — ABNORMAL HIGH (ref 1.7–7.7)
Neutrophils Relative %: 4 %
Platelets: 234 10*3/uL (ref 150–400)
RBC: 3.35 MIL/uL — ABNORMAL LOW (ref 3.87–5.11)
RDW: 15.1 % (ref 11.5–15.5)
WBC: 215.1 10*3/uL (ref 4.0–10.5)
nRBC: 0 % (ref 0.0–0.2)

## 2019-11-11 LAB — LACTATE DEHYDROGENASE: LDH: 318 U/L — ABNORMAL HIGH (ref 98–192)

## 2019-11-11 NOTE — Telephone Encounter (Signed)
Scheduled appt per 2/15 sch message -  Pt is aware of appt date and time

## 2019-11-11 NOTE — Progress Notes (Signed)
HEMATOLOGY/ONCOLOGY FOLLOW UP NOTE  Date of Service:  11/11/19     Patient Care Team: Patient, No Pcp Per as PCP - General (General Practice)  CHIEF COMPLAINTS F/u for continued management of SMZL   HISTORY OF PRESENTING ILLNESS:   Michelle Cohen is a wonderful 69 y.o. female who has been referred to [place] from [facility] for evaluation and management of B-cell lymphoma. She presents to her appointment today with her partner of 21 years. She states that she moved to the area ~2 years ago secondary to her sister's diagnosis of lung cancer (unfortunately, now passed) and taking care of her mother's Alzheimer's. Generally, prior to this issue she has been very healthy and without chronic medical conditions and not on chronic medical therapies/treatments.   Initially, the patient was admitted to [facility] for abdominal pain and significant hernia. She reports that she was visiting in [place] and while flying there she had an acute onset of sweats and left lower quadrant pain. She reports that her hernia was not present at that time, but as she continued to fly her hernia had become more noticeable and enlarged in her lower abdomen. Additionally, her partner reports that her episode of hydrosis was very significant, stating that it was pooling below her. On their arrival to [facility] on 06/21/17, a CT A/P was performed which showed an incarcerated hernia. Of note, her CT showed massive splenomegaly at 32cm in greatest dimension. Manual reduction of her hernia was attempted while in the ED but was unsuccessful. Pre-operative notes state that following intubation manual reduction was performed successfully and hernia was repaired laparoscopically. CT CAP was performed as well which was without the presence of additional surrounding LAD. She did have flow cytometry performed during her admission as well which showed B-cell lymphocytes which were monoclonal. A bone marrow biopsy was  also examined which showed that it was markedly hypercellular (~95%) with prominent interstitial infiltrate of small lymphocytes.   Prior to her diagnosis, she mostly felt typically well overall. She states that she did suffer from insomnia which she related to over-thinking at the time regarding her familial situation. She did experience some fatigue and weight loss with this as well. Her partner reports that she noticed her weight loss approximately 29moago, which she quantifies at approximately 30lbs since onset. Towards the end of this six month periods and just prior to the appearance of her hernia the patient began to notice some bothersome abdominal distension. She also noticed some significant fatigue and she would nap for longer periods following her daily workouts. Additionally, her partner would notice that she would have issues with breathing and this appeared irregular and more labored, especially at night while laying down. She also mentions that she did experience intermittent night sweats throughout this six month period as well. She denies lightheadedness, dizziness, abdominal pain, back pain, fever, chills, abnormal bruising/bleeding, bloody stools, epistaxis, hematuria, gingival bleeding, or other bleeding issues within this period. She has not received any blood transfusions and has not previously had a tattoo; she is unconcerned with prior HepC exposure.   Since her hernia surgery, she reports that she feels great symptomatically overall and much improved. She does have some issues with abdominal bloating and discomfort, but this has been manageable. No overt abdominal pain.   Throughout all of this, she has attempted to try to remain within good spirits. Losing her sister recently has been hard, but she has been working through it  with her family; however, her partner voices that they do not have strong social support in this area as most of their support is in [place] where they  previously lived. She has been working through her sister's death with grief counseling which has been helping her.    She has never previously smoked, but she reports some moderate second-hand smoke through her father at a younger age. Her father did die of a PE, but she is unaware of any other diagnoses of clotting disorders or cancers within her family. No h/o alcohol abuse, chemical/radiation exposures. She worked previously in Industrial/product designer.   PREVIOUS TREATMENT:  Rituximab weekly x4 beginning on 07/21/17-08/11/17 Received additional Rituxan weekly x 4 doses Rituximab  Weekly for 4 weeks starting 10/05/17 - completed 10/2017. S/p splenectomy at [facility] with Dr Shon Hough.  CURRENT TREATMENT:  Active surveillance  Contemplating retreating with Rituxan in 1-2 months   INTERIM HISTORY:   Michelle Cohen returns today for scheduled follow up of her SMZL. The patient's last visit with [place] was on 08/12/2019. The pt reports that she is doing well overall.  The pt reports that she has had both of her cataract surgeries and her vision has improved significantly. Pt is currently waitlisted for the COVID19 vaccine. Pt has continued to socially distance and keep herself safe. She has been eating and sleeping well. She has even gained some weight.  Lab results today (11/11/19) of CBC w/diff and CMP is as follows: all values are WNL except for WBC at 215K, RBC at 3.35, Hgb at 10.9, HCT at 34.3, MCV at 102.4, Neutro Abs at 8.6K, Lymphs Abs at 191.4K, Mono Abs at 8.6K, Eos Abs at 6.5K, WBC Morphology shows "Variant Lymphs", Smudge Cells are "Present", Calcium at 8.6. 11/11/2019 LDH at 318  On review of systems, pt denies SOB, fatigue, fevers, chills, night sweats, unexpected weight loss, abdominal pain, bowel habit changes and any other symptoms.   MEDICAL HISTORY:  Past Medical History:  Diagnosis Date  . B-cell lymphoma ([place]) 06/2017     SURGICAL HISTORY: Past Surgical  History:  Procedure Laterality Date  . left inguinal hernia repair Left   . SPLENECTOMY, TOTAL  02/13/2018    SOCIAL HISTORY: Social History   Socioeconomic History  . Marital status: Single    Spouse name: Not on file  . Number of children: Not on file  . Years of education: Not on file  . Highest education level: Not on file  Occupational History  . Not on file  Tobacco Use  . Smoking status: Never Smoker  . Smokeless tobacco: Never Used  Substance and Sexual Activity  . Alcohol use: Yes    Comment: glass of wine occasionally  . Drug use: No  . Sexual activity: Not on file  Other Topics Concern  . Not on file  Social History Narrative  . Not on file   Social Determinants of Health   Financial Resource Strain:   . Difficulty of Paying Living Expenses: Not on file  Food Insecurity:   . Worried About Charity fundraiser in the Last Year: Not on file  . Ran Out of Food in the Last Year: Not on file  Transportation Needs:   . Lack of Transportation (Medical): Not on file  . Lack of Transportation (Non-Medical): Not on file  Physical Activity:   . Days of Exercise per Week: Not on file  . Minutes of Exercise per Session: Not on file  Stress:   .  Feeling of Stress : Not on file  Social Connections:   . Frequency of Communication with Friends and Family: Not on file  . Frequency of Social Gatherings with Friends and Family: Not on file  . Attends Religious Services: Not on file  . Active Member of Clubs or Organizations: Not on file  . Attends Archivist Meetings: Not on file  . Marital Status: Not on file  Intimate Partner Violence:   . Fear of Current or Ex-Partner: Not on file  . Emotionally Abused: Not on file  . Physically Abused: Not on file  . Sexually Abused: Not on file    FAMILY HISTORY: Sister - lung cancer Mother -Alzheimers dementia  ALLERGIES:  has No Known Allergies.  MEDICATIONS:  Current Outpatient Medications  Medication Sig  Dispense Refill  . acetaminophen (TYLENOL) 500 MG tablet Take 500 mg by mouth every 8 (eight) hours as needed for moderate pain.     Marland Kitchen acyclovir (ZOVIRAX) 200 MG capsule TAKE 2 CAPSULES BY MOUTH TWICE DAILY 120 capsule 1  . albuterol (PROVENTIL HFA;VENTOLIN HFA) 108 (90 Base) MCG/ACT inhaler Inhale 1-2 puffs into the lungs every 6 (six) hours as needed for wheezing or shortness of breath. 1 Inhaler 0  . benzonatate (TESSALON) 200 MG capsule Take 1 capsule (200 mg total) by mouth 3 (three) times daily as needed for cough. 30 capsule 0  . chlorhexidine (PERIDEX) 0.12 % solution SWISH WITH 15 MLS IN MOUTH OR THROAT THEN SPIT --USE TWICE DAILY AS DIRECTED 473 mL 1  . chlorpheniramine-HYDROcodone (TUSSIONEX PENNKINETIC ER) 10-8 MG/5ML SUER Take 5 mLs by mouth every 12 (twelve) hours as needed for cough. 60 mL 0  . docusate sodium (COLACE) 100 MG capsule Take 100 mg by mouth 2 (two) times daily.    . fluticasone (FLONASE) 50 MCG/ACT nasal spray Place 2 sprays into both nostrils daily. 16 g 0  . gabapentin (NEURONTIN) 100 MG capsule 1 cap (148m) po twice daily (with breakfast and lunch) and 3 caps (308m po at bedtime 150 capsule 1  . hydrOXYzine (ATARAX/VISTARIL) 25 MG tablet Take 1-2 tablets (25-50 mg total) by mouth every 4 (four) hours as needed. 20 tablet 0  . Loratadine 10 MG CAPS Take 10 mg by mouth daily.    . magic mouthwash w/lidocaine SOLN Take 5 mLs by mouth 4 (four) times daily as needed for mouth pain. Swish and spit 1 Part viscous lidocaine 2% 1 Part Maalox (do not substitute Kaopectate) 1 Part diphenhydramine 12.5 mg per 5 ml elixir. 120 mL 2  . predniSONE (STERAPRED UNI-PAK 21 TAB) 10 MG (21) TBPK tablet Take by mouth daily. Take as prescribed starting with 6 tabs on day 1, decrease by 1 tab daily until you take 1 tab on day 6 21 tablet 0  . senna (SENOKOT) 8.6 MG TABS tablet Take 1 tablet by mouth daily as needed for mild constipation.     . Marland Kitchenpacer/Aero-Holding Chambers (AEROCHAMBER  PLUS) inhaler Use as instructed 1 each 2  . triamcinolone cream (KENALOG) 0.1 % Apply 1 application topically 2 (two) times daily. 45 g 0   No current facility-administered medications for this visit.    REVIEW OF SYSTEMS:   A 10+ POINT REVIEW OF SYSTEMS WAS OBTAINED including neurology, dermatology, psychiatry, cardiac, respiratory, lymph, extremities, GI, GU, Musculoskeletal, constitutional, breasts, reproductive, HEENT.  All pertinent positives are noted in the HPI.  All others are negative.   PHYSICAL EXAMINATION:  ECOG PERFORMANCE STATUS: 1 BP 115/67 (BP Location:  Left Arm, Patient Position: Sitting)   Pulse 64   Temp 97.8 F (36.6 C) (Temporal)   Resp 17   Ht 5' 6" (1.676 m)   Wt 166 lb 1.6 oz (75.3 kg)   SpO2 100%   BMI 26.81 kg/m    GENERAL:alert, in no acute distress and comfortable SKIN: no acute rashes, no significant lesions EYES: conjunctiva are pink and non-injected, sclera anicteric OROPHARYNX: MMM, no exudates, no oropharyngeal erythema or ulceration NECK: supple, no JVD LYMPH:  no palpable lymphadenopathy in the cervical, axillary or inguinal regions LUNGS: clear to auscultation b/l with normal respiratory effort HEART: regular rate & rhythm ABDOMEN:  normoactive bowel sounds , non tender, not distended. No palpable hepatosplenomegaly.  Extremity: no pedal edema PSYCH: alert & oriented x 3 with fluent speech NEURO: no focal motor/sensory deficits  LABORATORY DATA:   I have reviewed the data as listed   CBC Latest Ref Rng & Units 11/11/2019 08/12/2019 05/09/2019  WBC 4.0 - 10.5 K/uL 215.1(HH) 121.0(HH) 88.2(HH)  Hemoglobin 12.0 - 15.0 g/dL 10.9(L) 12.7 12.7  Hematocrit 36.0 - 46.0 % 34.3(L) 40.0 39.1  Platelets 150 - 400 K/uL 234 223 228  ANC 4.1k . CBC    Component Value Date/Time   WBC 215.1 (HH) 11/11/2019 1051   RBC 3.35 (L) 11/11/2019 1051   HGB 10.9 (L) 11/11/2019 1051   HGB 8.0 (L) 02/07/2018 0905   HGB 9.1 (L) 09/14/2017 0835   HCT 34.3 (L)  11/11/2019 1051   HCT 35.2 03/05/2018 1331   HCT 29.0 (L) 09/14/2017 0835   PLT 234 11/11/2019 1051   PLT 50 (L) 02/07/2018 0905   PLT 46 (L) 09/14/2017 0835   MCV 102.4 (H) 11/11/2019 1051   MCV 92.1 09/14/2017 0835   MCH 32.5 11/11/2019 1051   MCHC 31.8 11/11/2019 1051   RDW 15.1 11/11/2019 1051   RDW 18.2 (H) 09/14/2017 0835   LYMPHSABS 191.4 (H) 11/11/2019 1051   LYMPHSABS 2.0 09/14/2017 0835   MONOABS 8.6 (H) 11/11/2019 1051   MONOABS 0.3 09/14/2017 0835   EOSABS 6.5 (H) 11/11/2019 1051   EOSABS 0.0 09/14/2017 0835   BASOSABS 0.0 11/11/2019 1051   BASOSABS 0.0 09/14/2017 0835    . CMP Latest Ref Rng & Units 11/11/2019 08/12/2019 05/09/2019  Glucose 70 - 99 mg/dL 80 95 89  BUN 8 - 23 mg/dL _0 Creatinine 0.44 - 1.00 mg/dL 0.73 0.80 0.81  Sodium 135 - 145 mmol/L 141 142 141  Potassium 3.5 - 5.1 mmol/L 4.0 4.1 4.5  Chloride 98 - 111 mmol/L 107 105 107  CO2 22 - 32 mmol/L _1 Calcium 8.9 - 10.3 mg/dL 8.6(L) 8.9 9.3  Total Protein 6.5 - 8.1 g/dL 7.0 7.2 6.9  Total Bilirubin 0.3 - 1.2 mg/dL 1.1 1.2 1.4(H)  Alkaline Phos 38 - 126 U/L 71 64 66  AST 15 - 41 U/L _2 ALT 0 - 44 U/L _3 Component     Latest Ref Rng & Units 07/13/2017  LDH     125 - 245 U/L 232  Hep C Virus Ab     0.0 - 0.9 s/co ratio <0.1  HIV Screen 4th Generation wRfx     Non Reactive Non Reactive  Hep B Core Ab, Tot     Negative Negative  Hepatitis B Surface Ag     Negative Negative   Component     Latest Ref Rng & Units 07/28/2017  LDH     125 - 245 U/L 200  Uric Acid, Serum     2.6 - 7.4 mg/dl 6.0  Phosphorus     2.5 - 4.5 mg/dL 3.8   03/05/18 Pathology Report Splenectomy:      RADIOGRAPHIC STUDIES: I have personally reviewed the radiological images as listed and agreed with the findings in the report. No results found.   ASSESSMENT & PLAN:   Jalana Moore is a wonderful 70 y.o. caucasian female who presents to our clinic to discuss ongoing management of the  following:   1. 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  Her clinical presentation is also representative of this and that this may have been present over months to possibly years.  -With her pattern of involvement and results thus far it is likely that she has splenic marginal zone type lymphoma.  -PET scan on 07/25/2017 with results showing: Massive splenomegaly, splenic volume 5770 cubic cm, with homogeneous low grade activity throughout the spleen equal to that of the physiologic activity in the liver, and above the background blood pool activity in the mediastinum. No pathologic adenopathy identified. No bony involvement noted.   -CT A/p on 08/23/17 with results of: Marked splenomegaly without substantial interval change in splenic size/volume. Borderline lymphadenopathy in the hepatoduodenal ligament. Aortic Atherosclerosis. I discussed this with the patient in great detail.  -CT A/p on 09/15/17 shows significant splenomegaly shows minimal smaller change in size, with no evidence of bowel obstruction.   2. Pancytopenia s/p thrombocytopenia and anemia  Due to SMZL and hypersplenism Now resolved  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 Had to complete Rituxan at lower rate. Patient would not be a candidate for rapid infusion protocol and would need to keep the next infusion at the tolerated rate and not rate escalate. Plan  Completed with extensive pre-medications.  4. Oral mucositis/HSV outbreak - resolved -continue with acyclovir as needed   PLAN: -Discussed pt labwork today,  11/11/19; WBC nearly doubled, mild anemia, blood chemistries are are stable -Discussed 11/11/2019 LDH is elevated at 318 -Lab progression of SMZL, no clinical progression at this time -Recommend pt receive COVID19 vaccine when available - hope yo have her vaccinated prior to retreatment. -Advised pt that we would prefer to for her to  finish COVID19 vaccinations and then begin treatments -Discussed retreating with Rituxan (with proper pre-treatment), vs Rituxan + chemotherapy (likely Bendamustine) vs R-CVP vs. Ibrutinib -Advised pt that chemotherapy will lower immune system response and can create certain cytopenias -Advised pt that Ibrutinib would be a medication that she would continue longer term. -Advised pt that we would begin Allopurinol with treatment to reduce risk of tumor lysis -Will see back in 5 weeks with labs  FOLLOW UP: RTC with Dr Irene Limbo with labs in 5 weeks   The total time spent in the appt was 20 minutes and more than 50% was on counseling and direct patient cares.  All of the patient's questions were answered with apparent satisfaction. The patient knows to call the clinic with any problems, questions or concerns.   Sullivan Lone MD [place] AAHIVMS [facility] Hematology/Oncology Physician [facility]  (Office):       [phone] (Work cell):  [phone] (Fax):           [phone]  I, Yevette Edwards, am acting as a scribe for Dr. Sullivan Lone.   .Patient was Personally and independently interviewed, examined and relevant elements of the history of present illness were reviewed in details and an assessment and plan was created. All elements of the patient's history of present illness , assessment and plan were discussed in details with **. The above documentation reflects our combined findings assessment and plan.  .I have reviewed the above documentation for accuracy and completeness, and I agree with the above.  Sullivan Lone MD MS

## 2019-11-11 NOTE — Progress Notes (Signed)
CRITICAL VALUE ALERT  Critical Value:  WBC 215.1  Date & Time Notied:  11/11/19 At 1107  Provider Notified: Carlyon Prows RN with Dr. Irene Limbo  Orders Received/Actions taken: Sandi RN verbalized understanding and will alert MD

## 2019-11-13 ENCOUNTER — Encounter: Payer: Self-pay | Admitting: Hematology

## 2019-11-19 ENCOUNTER — Encounter: Payer: Self-pay | Admitting: Hematology

## 2019-11-23 MED FILL — ACYCLOVIR 200 MG CAP: 200 | 30 days supply | Qty: 120 | Fill #1

## 2019-12-11 ENCOUNTER — Encounter: Payer: Self-pay | Admitting: Hematology

## 2019-12-16 ENCOUNTER — Telehealth: Payer: Self-pay | Admitting: Hematology

## 2019-12-16 ENCOUNTER — Telehealth: Payer: Self-pay

## 2019-12-16 ENCOUNTER — Inpatient Hospital Stay: Payer: Medicare Other | Attending: Hematology | Admitting: Hematology

## 2019-12-16 ENCOUNTER — Inpatient Hospital Stay: Payer: Medicare Other

## 2019-12-16 ENCOUNTER — Other Ambulatory Visit: Payer: Self-pay

## 2019-12-16 VITALS — BP 141/80 | HR 65 | Temp 97.8°F | Resp 18 | Ht 66.0 in | Wt 164.3 lb

## 2019-12-16 DIAGNOSIS — Z801 Family history of malignant neoplasm of trachea, bronchus and lung: Secondary | ICD-10-CM | POA: Diagnosis not present

## 2019-12-16 DIAGNOSIS — Z7982 Long term (current) use of aspirin: Secondary | ICD-10-CM | POA: Diagnosis not present

## 2019-12-16 DIAGNOSIS — C8307 Small cell B-cell lymphoma, spleen: Secondary | ICD-10-CM

## 2019-12-16 DIAGNOSIS — Z79899 Other long term (current) drug therapy: Secondary | ICD-10-CM | POA: Insufficient documentation

## 2019-12-16 DIAGNOSIS — D61818 Other pancytopenia: Secondary | ICD-10-CM | POA: Insufficient documentation

## 2019-12-16 DIAGNOSIS — D649 Anemia, unspecified: Secondary | ICD-10-CM

## 2019-12-16 LAB — CBC WITH DIFFERENTIAL/PLATELET
Abs Immature Granulocytes: 0 10*3/uL (ref 0.00–0.07)
Basophils Absolute: 0 10*3/uL (ref 0.0–0.1)
Basophils Relative: 0 %
Eosinophils Absolute: 2.4 10*3/uL — ABNORMAL HIGH (ref 0.0–0.5)
Eosinophils Relative: 1 %
HCT: 33.2 % — ABNORMAL LOW (ref 36.0–46.0)
Hemoglobin: 10.4 g/dL — ABNORMAL LOW (ref 12.0–15.0)
Lymphocytes Relative: 95 %
Lymphs Abs: 224.3 10*3/uL — ABNORMAL HIGH (ref 0.7–4.0)
MCH: 31.8 pg (ref 26.0–34.0)
MCHC: 31.3 g/dL (ref 30.0–36.0)
MCV: 101.5 fL — ABNORMAL HIGH (ref 80.0–100.0)
Monocytes Absolute: 4.7 10*3/uL — ABNORMAL HIGH (ref 0.1–1.0)
Monocytes Relative: 2 %
Neutro Abs: 4.7 10*3/uL (ref 1.7–7.7)
Neutrophils Relative %: 2 %
Platelets: 219 10*3/uL (ref 150–400)
RBC: 3.27 MIL/uL — ABNORMAL LOW (ref 3.87–5.11)
RDW: 15.3 % (ref 11.5–15.5)
WBC: 236.1 10*3/uL (ref 4.0–10.5)
nRBC: 0 % (ref 0.0–0.2)

## 2019-12-16 LAB — CMP (CANCER CENTER ONLY)
ALT: 20 U/L (ref 0–44)
AST: 25 U/L (ref 15–41)
Albumin: 4 g/dL (ref 3.5–5.0)
Alkaline Phosphatase: 73 U/L (ref 38–126)
Anion gap: 9 (ref 5–15)
BUN: 17 mg/dL (ref 8–23)
CO2: 25 mmol/L (ref 22–32)
Calcium: 8.9 mg/dL (ref 8.9–10.3)
Chloride: 109 mmol/L (ref 98–111)
Creatinine: 0.78 mg/dL (ref 0.44–1.00)
GFR, Est AFR Am: >60 mL/min (ref >60)
GFR, Estimated: >60 mL/min (ref >60)
Glucose, Bld: 82 mg/dL (ref 70–99)
Potassium: 4.3 mmol/L (ref 3.5–5.1)
Sodium: 143 mmol/L (ref 135–145)
Total Bilirubin: 1.3 mg/dL — ABNORMAL HIGH (ref 0.3–1.2)
Total Protein: 7 g/dL (ref 6.5–8.1)

## 2019-12-16 LAB — URIC ACID: Uric Acid, Serum: 7 mg/dL (ref 2.5–7.1)

## 2019-12-16 LAB — LACTATE DEHYDROGENASE: LDH: 397 U/L — ABNORMAL HIGH (ref 98–192)

## 2019-12-16 MED ORDER — PREDNISONE 20 MG PO TABS: 40.0000 mg | ORAL_TABLET | Freq: Every day | ORAL | 0 refills | Status: AC

## 2019-12-16 MED ORDER — ALLOPURINOL 100 MG PO TABS: 100.0000 mg | ORAL_TABLET | Freq: Two times a day (BID) | ORAL | 0 refills | Status: DC

## 2019-12-16 MED FILL — predniSONE 20 MG TABS: 20 | 30 days supply | Qty: 60 | Fill #0

## 2019-12-16 MED FILL — ALLOPURINOL 100 MG TABS: 100 | 30 days supply | Qty: 60 | Fill #0

## 2019-12-16 NOTE — Progress Notes (Signed)
HEMATOLOGY/ONCOLOGY FOLLOW UP NOTE  Date of Service:  12/16/19     Patient Care Team: Patient, No Pcp Per as PCP - General (General Practice)  CHIEF COMPLAINTS F/u for continued management of SMZL   HISTORY OF PRESENTING ILLNESS:   Michelle Cohen is a wonderful 69 y.o. female who has been referred to [place] from [facility] for evaluation and management of B-cell lymphoma. She presents to her appointment today with her partner of 21 years. She states that she moved to the area ~2 years ago secondary to her sister's diagnosis of lung cancer (unfortunately, now passed) and taking care of her mother's Alzheimer's. Generally, prior to this issue she has been very healthy and without chronic medical conditions and not on chronic medical therapies/treatments.   Initially, the patient was admitted to [facility] for abdominal pain and significant hernia. She reports that she was visiting in [place] and while flying there she had an acute onset of sweats and left lower quadrant pain. She reports that her hernia was not present at that time, but as she continued to fly her hernia had become more noticeable and enlarged in her lower abdomen. Additionally, her partner reports that her episode of hydrosis was very significant, stating that it was pooling below her. On their arrival to [facility] on 06/21/17, a CT A/P was performed which showed an incarcerated hernia. Of note, her CT showed massive splenomegaly at 32cm in greatest dimension. Manual reduction of her hernia was attempted while in the ED but was unsuccessful. Pre-operative notes state that following intubation manual reduction was performed successfully and hernia was repaired laparoscopically. CT CAP was performed as well which was without the presence of additional surrounding LAD. She did have flow cytometry performed during her admission as well which showed B-cell lymphocytes which were monoclonal. A bone marrow biopsy was  also examined which showed that it was markedly hypercellular (~95%) with prominent interstitial infiltrate of small lymphocytes.   Prior to her diagnosis, she mostly felt typically well overall. She states that she did suffer from insomnia which she related to over-thinking at the time regarding her familial situation. She did experience some fatigue and weight loss with this as well. Her partner reports that she noticed her weight loss approximately 80moago, which she quantifies at approximately 30lbs since onset. Towards the end of this six month periods and just prior to the appearance of her hernia the patient began to notice some bothersome abdominal distension. She also noticed some significant fatigue and she would nap for longer periods following her daily workouts. Additionally, her partner would notice that she would have issues with breathing and this appeared irregular and more labored, especially at night while laying down. She also mentions that she did experience intermittent night sweats throughout this six month period as well. She denies lightheadedness, dizziness, abdominal pain, back pain, fever, chills, abnormal bruising/bleeding, bloody stools, epistaxis, hematuria, gingival bleeding, or other bleeding issues within this period. She has not received any blood transfusions and has not previously had a tattoo; she is unconcerned with prior HepC exposure.   Since her hernia surgery, she reports that she feels great symptomatically overall and much improved. She does have some issues with abdominal bloating and discomfort, but this has been manageable. No overt abdominal pain.   Throughout all of this, she has attempted to try to remain within good spirits. Losing her sister recently has been hard, but she has been working through it  with her family; however, her partner voices that they do not have strong social support in this area as most of their support is in [place] where they  previously lived. She has been working through her sister's death with grief counseling which has been helping her.    She has never previously smoked, but she reports some moderate second-hand smoke through her father at a younger age. Her father did die of a PE, but she is unaware of any other diagnoses of clotting disorders or cancers within her family. No h/o alcohol abuse, chemical/radiation exposures. She worked previously in Industrial/product designer.   PREVIOUS TREATMENT:  Rituximab weekly x4 beginning on 07/21/17-08/11/17 Received additional Rituxan weekly x 4 doses Rituximab  Weekly for 4 weeks starting 10/05/17 - completed 10/2017. S/p splenectomy at [facility] with Dr Shon Hough.  CURRENT TREATMENT:  Active surveillance  Contemplating retreating with Rituxan in 1-2 months   INTERIM HISTORY:   Michelle Cohen returns today for scheduled follow up of her SMZL. We are joined today by her wife. The patient's last visit with [place] was on 11/11/2019. The pt reports that she is doing well overall.  The pt reports that she has been having night sweats sporadically since our last visit. The are enough to make her change her shirt at night. She denies any fevers, chills, fatigue, or skin rashes. Pt has had both doses of the COVID19 vaccine and tolerated them well.  Lab results today (12/16/19) of CBC w/diff and CMP is as follows: all values are WNL except for WBC at 236.1K, RBC at 3.27, Hgb at 10.4, HCT at 33.2, MCV at 101.5, Lymphs Abs at 224.3K, Mono Abs at 4.7K, Eos Abs at 2.4K, Total Bilirubin at 1.3. 12/16/2019 LDH at 397 12/16/2019 Uric acid at 7.0  On review of systems, pt reports night sweats and denies fevers, chills, fatigue, skin rashes, new lumps/bumps, unexpected weight loss, abdominal pain, diarrhea, constipation, new joint pain/swelling and any other symptoms.   MEDICAL HISTORY:  Past Medical History:  Diagnosis Date  . B-cell lymphoma ([place]) 06/2017     SURGICAL  HISTORY: Past Surgical History:  Procedure Laterality Date  . left inguinal hernia repair Left   . SPLENECTOMY, TOTAL  02/13/2018    SOCIAL HISTORY: Social History   Socioeconomic History  . Marital status: Single    Spouse name: Not on file  . Number of children: Not on file  . Years of education: Not on file  . Highest education level: Not on file  Occupational History  . Not on file  Tobacco Use  . Smoking status: Never Smoker  . Smokeless tobacco: Never Used  Substance and Sexual Activity  . Alcohol use: Yes    Comment: glass of wine occasionally  . Drug use: No  . Sexual activity: Not on file  Other Topics Concern  . Not on file  Social History Narrative  . Not on file   Social Determinants of Health   Financial Resource Strain:   . Difficulty of Paying Living Expenses:   Food Insecurity:   . Worried About Charity fundraiser in the Last Year:   . Arboriculturist in the Last Year:   Transportation Needs:   . Film/video editor (Medical):   Marland Kitchen Lack of Transportation (Non-Medical):   Physical Activity:   . Days of Exercise per Week:   . Minutes of Exercise per Session:   Stress:   . Feeling of Stress :  Social Connections:   . Frequency of Communication with Friends and Family:   . Frequency of Social Gatherings with Friends and Family:   . Attends Religious Services:   . Active Member of Clubs or Organizations:   . Attends Archivist Meetings:   Marland Kitchen Marital Status:   Intimate Partner Violence:   . Fear of Current or Ex-Partner:   . Emotionally Abused:   Marland Kitchen Physically Abused:   . Sexually Abused:     FAMILY HISTORY: Sister - lung cancer Mother -Alzheimers dementia  ALLERGIES:  is allergic to tape.  MEDICATIONS:  Current Outpatient Medications  Medication Sig Dispense Refill  . acetaminophen (TYLENOL) 500 MG tablet Take 500 mg by mouth every 8 (eight) hours as needed for moderate pain.     Marland Kitchen acyclovir (ZOVIRAX) 200 MG capsule TAKE 2  CAPSULES BY MOUTH TWICE DAILY 120 capsule 1  . acyclovir (ZOVIRAX) 200 MG capsule Take by mouth.    Marland Kitchen albuterol (PROVENTIL HFA;VENTOLIN HFA) 108 (90 Base) MCG/ACT inhaler Inhale 1-2 puffs into the lungs every 6 (six) hours as needed for wheezing or shortness of breath. 1 Inhaler 0  . aspirin 81 MG EC tablet Take by mouth.    . benzonatate (TESSALON) 200 MG capsule Take 1 capsule (200 mg total) by mouth 3 (three) times daily as needed for cough. 30 capsule 0  . chlorhexidine (PERIDEX) 0.12 % solution SWISH WITH 15 MLS IN MOUTH OR THROAT THEN SPIT --USE TWICE DAILY AS DIRECTED 473 mL 1  . chlorpheniramine-HYDROcodone (TUSSIONEX PENNKINETIC ER) 10-8 MG/5ML SUER Take 5 mLs by mouth every 12 (twelve) hours as needed for cough. 60 mL 0  . Cyanocobalamin (VITAMIN B 12 PO) Take by mouth.    . Diphenhyd-Hydrocort-Nystatin (FIRST-DUKES MOUTHWASH MT) Take by mouth.    . docusate sodium (COLACE) 100 MG capsule Take 100 mg by mouth 2 (two) times daily.    Marland Kitchen docusate sodium (COLACE) 50 MG capsule Take by mouth.    . fluticasone (FLONASE) 50 MCG/ACT nasal spray Place 2 sprays into both nostrils daily. 16 g 0  . folic acid (FOLVITE) 1 MG tablet Take by mouth.    . gabapentin (NEURONTIN) 100 MG capsule 1 cap (126m) po twice daily (with breakfast and lunch) and 3 caps (3087m po at bedtime 150 capsule 1  . hydrOXYzine (ATARAX/VISTARIL) 25 MG tablet Take 1-2 tablets (25-50 mg total) by mouth every 4 (four) hours as needed. 20 tablet 0  . hydrOXYzine (VISTARIL) 25 MG capsule Take by mouth.    . Loratadine 10 MG CAPS Take 10 mg by mouth daily.    . magic mouthwash w/lidocaine SOLN Take 5 mLs by mouth 4 (four) times daily as needed for mouth pain. Swish and spit 1 Part viscous lidocaine 2% 1 Part Maalox (do not substitute Kaopectate) 1 Part diphenhydramine 12.5 mg per 5 ml elixir. 120 mL 2  . moxifloxacin (VIGAMOX) 0.5 % ophthalmic solution     . oxyCODONE (OXY IR/ROXICODONE) 5 MG immediate release tablet Take by  mouth.    . Prednisolon-Gatiflox-Bromfenac 1-0.5-0.075 % SUSP Place 1 drop into the right eye 4 (four) times daily.    . prednisoLONE acetate (PRED FORTE) 1 % ophthalmic suspension     . predniSONE (STERAPRED UNI-PAK 21 TAB) 10 MG (21) TBPK tablet Take by mouth daily. Take as prescribed starting with 6 tabs on day 1, decrease by 1 tab daily until you take 1 tab on day 6 21 tablet 0  . senna (SENOKOT) 8.6  MG TABS tablet Take 1 tablet by mouth daily as needed for mild constipation.     Marland Kitchen Spacer/Aero-Holding Chambers (AEROCHAMBER PLUS) inhaler Use as instructed 1 each 2  . triamcinolone cream (KENALOG) 0.1 % Apply 1 application topically 2 (two) times daily. 45 g 0   No current facility-administered medications for this visit.    REVIEW OF SYSTEMS:   A 10+ POINT REVIEW OF SYSTEMS WAS OBTAINED including neurology, dermatology, psychiatry, cardiac, respiratory, lymph, extremities, GI, GU, Musculoskeletal, constitutional, breasts, reproductive, HEENT.  All pertinent positives are noted in the HPI.  All others are negative.   PHYSICAL EXAMINATION:  ECOG PERFORMANCE STATUS: 1 BP (!) 141/80 (BP Location: Left Arm, Patient Position: Sitting)   Pulse 65   Temp 97.8 F (36.6 C) (Temporal)   Resp 18   Ht '5\' 6"'  (1.676 m)   Wt 164 lb 4.8 oz (74.5 kg)   SpO2 100%   BMI 26.52 kg/m    GENERAL:alert, in no acute distress and comfortable SKIN: no acute rashes, no significant lesions EYES: conjunctiva are pink and non-injected, sclera anicteric OROPHARYNX: MMM, no exudates, no oropharyngeal erythema or ulceration NECK: supple, no JVD LYMPH:  no palpable lymphadenopathy in the cervical, axillary or inguinal regions LUNGS: clear to auscultation b/l with normal respiratory effort HEART: regular rate & rhythm ABDOMEN:  normoactive bowel sounds , non tender, not distended. No palpable hepatosplenomegaly.  Extremity: no pedal edema PSYCH: alert & oriented x 3 with fluent speech NEURO: no focal  motor/sensory deficits  LABORATORY DATA:   I have reviewed the data as listed   CBC Latest Ref Rng & Units 12/16/2019 11/11/2019 08/12/2019  WBC 4.0 - 10.5 K/uL 236.1(HH) 215.1(HH) 121.0(HH)  Hemoglobin 12.0 - 15.0 g/dL 10.4(L) 10.9(L) 12.7  Hematocrit 36.0 - 46.0 % 33.2(L) 34.3(L) 40.0  Platelets 150 - 400 K/uL 219 234 223  ANC 4.1k . CBC    Component Value Date/Time   WBC 236.1 (HH) 12/16/2019 1005   RBC 3.27 (L) 12/16/2019 1005   HGB 10.4 (L) 12/16/2019 1005   HGB 8.0 (L) 02/07/2018 0905   HGB 9.1 (L) 09/14/2017 0835   HCT 33.2 (L) 12/16/2019 1005   HCT 35.2 03/05/2018 1331   HCT 29.0 (L) 09/14/2017 0835   PLT 219 12/16/2019 1005   PLT 50 (L) 02/07/2018 0905   PLT 46 (L) 09/14/2017 0835   MCV 101.5 (H) 12/16/2019 1005   MCV 92.1 09/14/2017 0835   MCH 31.8 12/16/2019 1005   MCHC 31.3 12/16/2019 1005   RDW 15.3 12/16/2019 1005   RDW 18.2 (H) 09/14/2017 0835   LYMPHSABS 224.3 (H) 12/16/2019 1005   LYMPHSABS 2.0 09/14/2017 0835   MONOABS 4.7 (H) 12/16/2019 1005   MONOABS 0.3 09/14/2017 0835   EOSABS 2.4 (H) 12/16/2019 1005   EOSABS 0.0 09/14/2017 0835   BASOSABS 0.0 12/16/2019 1005   BASOSABS 0.0 09/14/2017 0835    . CMP Latest Ref Rng & Units 12/16/2019 11/11/2019 08/12/2019  Glucose 70 - 99 mg/dL 82 80 95  BUN 8 - 23 mg/dL '17 17 12  ' Creatinine 0.44 - 1.00 mg/dL 0.78 0.73 0.80  Sodium 135 - 145 mmol/L 143 141 142  Potassium 3.5 - 5.1 mmol/L 4.3 4.0 4.1  Chloride 98 - 111 mmol/L 109 107 105  CO2 22 - 32 mmol/L '25 26 24  ' Calcium 8.9 - 10.3 mg/dL 8.9 8.6(L) 8.9  Total Protein 6.5 - 8.1 g/dL 7.0 7.0 7.2  Total Bilirubin 0.3 - 1.2 mg/dL 1.3(H) 1.1 1.2  Alkaline  Phos 38 - 126 U/L 73 71 64  AST 15 - 41 U/L '25 19 19  ' ALT 0 - 44 U/L '20 16 17   ' Component     Latest Ref Rng & Units 07/13/2017  LDH     125 - 245 U/L 232  Hep C Virus Ab     0.0 - 0.9 s/co ratio <0.1  HIV Screen 4th Generation wRfx     Non Reactive Non Reactive  Hep B Core Ab, Tot     Negative  Negative  Hepatitis B Surface Ag     Negative Negative   Component     Latest Ref Rng & Units 07/28/2017  LDH     125 - 245 U/L 200  Uric Acid, Serum     2.6 - 7.4 mg/dl 6.0  Phosphorus     2.5 - 4.5 mg/dL 3.8   03/05/18 Pathology Report Splenectomy:      RADIOGRAPHIC STUDIES: I have personally reviewed the radiological images as listed and agreed with the findings in the report. No results found.   ASSESSMENT & PLAN:   Lennyn Gange is a wonderful 69 y.o. 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  Her clinical presentation is also representative of this and that this may have been present over months to possibly years.  -With her pattern of involvement and results thus far it is likely that she has splenic marginal zone type lymphoma.  -PET scan on 07/25/2017 with results showing: Massive splenomegaly, splenic volume 5770 cubic cm, with homogeneous low grade activity throughout the spleen equal to that of the physiologic activity in the liver, and above the background blood pool activity in the mediastinum. No pathologic adenopathy identified. No bony involvement noted.   -CT A/p on 08/23/17 with results of: Marked splenomegaly without substantial interval change in splenic size/volume. Borderline lymphadenopathy in the hepatoduodenal ligament. Aortic Atherosclerosis. I discussed this with the patient in great detail.  -CT A/p on 09/15/17 shows significant splenomegaly shows minimal smaller change in size, with no evidence of bowel obstruction.   2. Pancytopenia s/p thrombocytopenia and anemia  Due  to SMZL and hypersplenism Now resolved  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 Had to complete Rituxan at lower rate. Patient would not be a candidate for rapid infusion protocol and would need to keep the next infusion at the tolerated rate and not rate escalate. Plan  Completed with extensive pre-medications.  4. Oral mucositis/HSV outbreak - resolved -continue with acyclovir as needed   PLAN: -Discussed pt labwork today, 12/16/19; mildly progressed anemia, WBC continues to increase, blood chemistries are stable -Discussed 12/16/2019 LDH is continuing to elevate at 397 -Discussed 12/16/2019 Uric acid is WNL at 7.0 -Lab progression of SMZL, no clinical progression at this time - would like to begin treatment before anemia becomes limiting -Dicussed retreating with Rituxan only vs Rituxan + Bendamustine vs R-CVP vs Ibrutinib - pt current blood counts allow [place] to consider chemotherapy treatments at this time -Advised pt that Rituxan + Bendamustine will likely produce a quicker  and deeper response than Rituxan alone  -Would keep chemotherapy doses conservative. Considering 26 mg/m^2 of Rituxan.  -Advised pt that chemotherapy does increase risk of infection  -Advised pt that Ibrutinib is generally tolerated well, but is continued long-term  -Advised pt that [place] does not typically manifest in new organ systems. Some lymphadenopathy is not unexpected, but would not expect bulky disease at this time.  -Pt would prefer to begin with Rituxan only. Plan to begin in 3 weeks. -Will begin Allopurinol in 2 weeks. Will watch for TLS. Recommend pt drink 48-64 oz of water per day to stay well hydrated.  -Continue Loratadine and add OTC Pepcid 1 week prior to the beginning of treatment  -Advised pt to begin Prednisone 5-7 days prior to treatment  -Will get CT C/A/P in 2 weeks   -Will see back in 3 weeks with labs   FOLLOW UP: Please schedule Rituxan weekly x 4 doses with labs and MD vist starting in 3 weeks CT chest/abd/pelvis in 2 weeks  FYI - 1st AM appointment for Rituxan and additional time for Rituxan since her dose will be capped @ 236m/hour  The total time spent in the appt was 45 minutes and more than 50% was on counseling and direct patient cares.  All of the patient's questions were answered with apparent satisfaction. The patient knows to call the clinic with any problems, questions or concerns.   GSullivan LoneMD MSchram CityAAHIVMS SEast [facility] LPCChristus Santa Rosa Hospital - Alamo HeightsHematology/Oncology Physician CEvanston Regional Hospital (Office):       [phone](Work cell):  [phone](Fax):           [phone] I, JYevette Edwards am acting as a scribe for Dr. GSullivan Lone   .I have reviewed the above documentation for accuracy and completeness, and I agree with the above. .Brunetta GeneraMD

## 2019-12-16 NOTE — Telephone Encounter (Signed)
Scheduled per los. Gave avs and calendar.   ** had to push out start date to 4 weeks due to back procedure

## 2019-12-16 NOTE — Telephone Encounter (Signed)
1029- Received a call from Gregory in the lab reporting WBC 236.1. Dr. Irene Limbo made aware at 1031. No further orders received at this time.

## 2019-12-31 ENCOUNTER — Other Ambulatory Visit: Payer: Self-pay

## 2019-12-31 ENCOUNTER — Ambulatory Visit (HOSPITAL_COMMUNITY)
Admission: RE | Admit: 2019-12-31 | Discharge: 2019-12-31 | Disposition: A | Discharge status: Home / Self Care | Payer: Medicare Other | Source: Ambulatory Visit | Attending: Hematology | Admitting: Hematology

## 2019-12-31 ENCOUNTER — Encounter: Payer: Self-pay | Admitting: Hematology

## 2019-12-31 DIAGNOSIS — D649 Anemia, unspecified: Secondary | ICD-10-CM | POA: Diagnosis not present

## 2019-12-31 DIAGNOSIS — C8307 Small cell B-cell lymphoma, spleen: Secondary | ICD-10-CM | POA: Insufficient documentation

## 2019-12-31 DIAGNOSIS — C8300 Small cell B-cell lymphoma, unspecified site: Secondary | ICD-10-CM | POA: Diagnosis not present

## 2019-12-31 MED ORDER — SODIUM CHLORIDE (PF) 0.9 % IJ SOLN
INTRAMUSCULAR | Status: AC
  Filled 2019-12-31: qty 50

## 2019-12-31 MED ORDER — IOHEXOL 350 MG/ML SOLN: 100.0000 mL | Freq: Once | INTRAVENOUS | Status: DC | PRN

## 2019-12-31 MED ORDER — IOHEXOL 300 MG/ML  SOLN
100.0000 mL | Freq: Once | INTRAMUSCULAR | Status: AC | PRN
  Administered 2019-12-31: 09:00:00 100 mL via INTRAVENOUS

## 2020-01-09 NOTE — Progress Notes (Signed)
Pharmacist Chemotherapy Monitoring - Follow Up Assessment    I verify that I have reviewed each item in the below checklist:  . Regimen for the patient is scheduled for the appropriate day and plan matches scheduled date. Marland Kitchen Appropriate non-routine labs are ordered dependent on drug ordered. . If applicable, additional medications reviewed and ordered per protocol based on lifetime cumulative doses and/or treatment regimen.   Plan for follow-up and/or issues identified: No . I-vent associated with next due treatment: No . MD and/or nursing notified: No  Anacristina Steffek K 01/09/2020 8:35 AM

## 2020-01-14 ENCOUNTER — Inpatient Hospital Stay: Payer: Medicare Other

## 2020-01-14 ENCOUNTER — Inpatient Hospital Stay: Payer: Medicare Other | Attending: Hematology | Admitting: Hematology

## 2020-01-14 ENCOUNTER — Other Ambulatory Visit: Payer: Self-pay

## 2020-01-14 VITALS — BP 140/75 | HR 62 | Temp 97.8°F | Resp 18 | Ht 66.0 in | Wt 164.6 lb

## 2020-01-14 DIAGNOSIS — Z79899 Other long term (current) drug therapy: Secondary | ICD-10-CM | POA: Insufficient documentation

## 2020-01-14 DIAGNOSIS — Z801 Family history of malignant neoplasm of trachea, bronchus and lung: Secondary | ICD-10-CM | POA: Diagnosis not present

## 2020-01-14 DIAGNOSIS — I700 Atherosclerosis of aorta: Secondary | ICD-10-CM | POA: Insufficient documentation

## 2020-01-14 DIAGNOSIS — Z5112 Encounter for antineoplastic immunotherapy: Secondary | ICD-10-CM | POA: Insufficient documentation

## 2020-01-14 DIAGNOSIS — G4700 Insomnia, unspecified: Secondary | ICD-10-CM | POA: Insufficient documentation

## 2020-01-14 DIAGNOSIS — D649 Anemia, unspecified: Secondary | ICD-10-CM

## 2020-01-14 DIAGNOSIS — C8307 Small cell B-cell lymphoma, spleen: Secondary | ICD-10-CM | POA: Diagnosis not present

## 2020-01-14 DIAGNOSIS — R634 Abnormal weight loss: Secondary | ICD-10-CM | POA: Insufficient documentation

## 2020-01-14 DIAGNOSIS — R162 Hepatomegaly with splenomegaly, not elsewhere classified: Secondary | ICD-10-CM | POA: Insufficient documentation

## 2020-01-14 DIAGNOSIS — Z9081 Acquired absence of spleen: Secondary | ICD-10-CM | POA: Diagnosis not present

## 2020-01-14 DIAGNOSIS — D61818 Other pancytopenia: Secondary | ICD-10-CM | POA: Insufficient documentation

## 2020-01-14 DIAGNOSIS — R5383 Other fatigue: Secondary | ICD-10-CM | POA: Diagnosis not present

## 2020-01-14 DIAGNOSIS — Z7982 Long term (current) use of aspirin: Secondary | ICD-10-CM | POA: Diagnosis not present

## 2020-01-14 LAB — CMP (CANCER CENTER ONLY)
ALT: 18 U/L (ref 0–44)
AST: 17 U/L (ref 15–41)
Albumin: 3.7 g/dL (ref 3.5–5.0)
Alkaline Phosphatase: 58 U/L (ref 38–126)
Anion gap: 11 (ref 5–15)
BUN: 16 mg/dL (ref 8–23)
CO2: 24 mmol/L (ref 22–32)
Calcium: 8.6 mg/dL — ABNORMAL LOW (ref 8.9–10.3)
Chloride: 107 mmol/L (ref 98–111)
Creatinine: 0.76 mg/dL (ref 0.44–1.00)
GFR, Est AFR Am: >60 mL/min (ref >60)
GFR, Estimated: >60 mL/min (ref >60)
Glucose, Bld: 93 mg/dL (ref 70–99)
Potassium: 3.9 mmol/L (ref 3.5–5.1)
Sodium: 142 mmol/L (ref 135–145)
Total Bilirubin: 1.4 mg/dL — ABNORMAL HIGH (ref 0.3–1.2)
Total Protein: 6.8 g/dL (ref 6.5–8.1)

## 2020-01-14 LAB — CBC WITH DIFFERENTIAL/PLATELET
Abs Immature Granulocytes: 0 10*3/uL (ref 0.00–0.07)
Basophils Absolute: 0 10*3/uL (ref 0.0–0.1)
Basophils Relative: 0 %
Eosinophils Absolute: 0 10*3/uL (ref 0.0–0.5)
Eosinophils Relative: 0 %
HCT: 30.5 % — ABNORMAL LOW (ref 36.0–46.0)
Hemoglobin: 9.5 g/dL — ABNORMAL LOW (ref 12.0–15.0)
Lymphocytes Relative: 94 %
Lymphs Abs: 230.3 10*3/uL — ABNORMAL HIGH (ref 0.7–4.0)
MCH: 31.9 pg (ref 26.0–34.0)
MCHC: 31.1 g/dL (ref 30.0–36.0)
MCV: 102.3 fL — ABNORMAL HIGH (ref 80.0–100.0)
Monocytes Absolute: 4.9 10*3/uL — ABNORMAL HIGH (ref 0.1–1.0)
Monocytes Relative: 2 %
Neutro Abs: 9.8 10*3/uL — ABNORMAL HIGH (ref 1.7–7.7)
Neutrophils Relative %: 4 %
Platelets: 209 10*3/uL (ref 150–400)
RBC: 2.98 MIL/uL — ABNORMAL LOW (ref 3.87–5.11)
RDW: 15.9 % — ABNORMAL HIGH (ref 11.5–15.5)
WBC: 245 10*3/uL (ref 4.0–10.5)
nRBC: 0.1 % (ref 0.0–0.2)

## 2020-01-14 LAB — URIC ACID: Uric Acid, Serum: 4.9 mg/dL (ref 2.5–7.1)

## 2020-01-14 NOTE — Progress Notes (Signed)
CRITICAL VALUE STICKER  CRITICAL VALUE: WBC 245.0  RECEIVER (on-site recipient of call): Jobe Igo, Hoehne NOTIFIED: 01/14/20 @ [phone]   MESSENGER (representative from lab): Hillary  MD NOTIFIED: Sullivan Lone   TIME OF NOTIFICATION: [phone]  RESPONSE: MD to address during visit with patient this morning.

## 2020-01-14 NOTE — Progress Notes (Signed)
HEMATOLOGY/ONCOLOGY FOLLOW UP NOTE  Date of Service:  01/14/20     Patient Care Team: Patient, No Pcp Per as PCP - General (General Practice)  CHIEF COMPLAINTS F/u for continued management of SMZL   HISTORY OF PRESENTING ILLNESS:   Michelle Cohen is a wonderful 69 y.o. female who has been referred to [place] from [place] [facility] for evaluation and management of B-cell lymphoma. She presents to her appointment today with her partner of 21 years. She states that she moved to the area ~2 years ago secondary to her sister's diagnosis of lung cancer (unfortunately, now passed) and taking care of her mother's Alzheimer's. Generally, prior to this issue she has been very healthy and without chronic medical conditions and not on chronic medical therapies/treatments.   Initially, the patient was admitted to [place] [facility] for abdominal pain and significant hernia. She reports that she was visiting in [place] and while flying there she had an acute onset of sweats and left lower quadrant pain. She reports that her hernia was not present at that time, but as she continued to fly her hernia had become more noticeable and enlarged in her lower abdomen. Additionally, her partner reports that her episode of hydrosis was very significant, stating that it was pooling below her. On their arrival to [facility] on 06/21/17, a CT A/P was performed which showed an incarcerated hernia. Of note, her CT showed massive splenomegaly at 32cm in greatest dimension. Manual reduction of her hernia was attempted while in the ED but was unsuccessful. Pre-operative notes state that following intubation manual reduction was performed successfully and hernia was repaired laparoscopically. CT CAP was performed as well which was without the presence of additional surrounding LAD. She did have flow cytometry performed during her admission as well which showed B-cell lymphocytes which were monoclonal. A bone marrow biopsy was  also examined which showed that it was markedly hypercellular (~95%) with prominent interstitial infiltrate of small lymphocytes.   Prior to her diagnosis, she mostly felt typically well overall. She states that she did suffer from insomnia which she related to over-thinking at the time regarding her familial situation. She did experience some fatigue and weight loss with this as well. Her partner reports that she noticed her weight loss approximately 87moago, which she quantifies at approximately 30lbs since onset. Towards the end of this six month periods and just prior to the appearance of her hernia the patient began to notice some bothersome abdominal distension. She also noticed some significant fatigue and she would nap for longer periods following her daily workouts. Additionally, her partner would notice that she would have issues with breathing and this appeared irregular and more labored, especially at night while laying down. She also mentions that she did experience intermittent night sweats throughout this six month period as well. She denies lightheadedness, dizziness, abdominal pain, back pain, fever, chills, abnormal bruising/bleeding, bloody stools, epistaxis, hematuria, gingival bleeding, or other bleeding issues within this period. She has not received any blood transfusions and has not previously had a tattoo; she is unconcerned with prior HepC exposure.   Since her hernia surgery, she reports that she feels great symptomatically overall and much improved. She does have some issues with abdominal bloating and discomfort, but this has been manageable. No overt abdominal pain.   Throughout all of this, she has attempted to try to remain within good spirits. Losing her sister recently has been hard, but she has been working through it  with her family; however, her partner voices that they do not have strong social support in this area as most of their support is in [place] where they  previously lived. She has been working through her sister's death with grief counseling which has been helping her.    She has never previously smoked, but she reports some moderate second-hand smoke through her father at a younger age. Her father did die of a PE, but she is unaware of any other diagnoses of clotting disorders or cancers within her family. No h/o alcohol abuse, chemical/radiation exposures. She worked previously in Industrial/product designer.   PREVIOUS TREATMENT:  Rituximab weekly x4 beginning on 07/21/17-08/11/17 Received additional Rituxan weekly x 4 doses Rituximab  Weekly for 4 weeks starting 10/05/17 - completed 10/2017. S/p splenectomy at [place] with Dr Shon Hough.  CURRENT TREATMENT:  Active surveillance  Contemplating retreating with Rituxan in 1-2 months   INTERIM HISTORY:   Michelle Cohen returns today for scheduled follow up of her SMZL. We are joined today by her wife. The patient's last visit with [place] was on 12/16/2019. The pt reports that she is doing well overall.  The pt reports that she had a rash on her shoulder blades, elbows, and the top of her left foot that has since resolved. The rash was not itchy or painful and lasted for 1-2 weeks. She used benadryl lotion for the duration of the rash, but it began to disappear after pt began taking Prednisone. Pt has not had any night sweats since our last visit. Pt has been sleeping and eating well and getting plenty of activity.   Of note since the patient's last visit, pt has had CT C/A/P ([id number]) ([id number]) completed on 12/31/2019 with results revealing "1. 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 Although there are scattered tiny lymph nodes in the chest, no lymphadenopathy by size criteria in the thorax or pelvis. 2. Hepatomegaly. 3. Prior splenectomy. 4. Enlargement of the pulmonary outflow tract and main pulmonary arteries suggests pulmonary arterial  hypertension. 5.  Aortic Atherosclerois (ICD10-170.0)."  Lab results today (01/14/20) of CBC w/diff and CMP is as follows: all values are WNL except for WBC at 245.0K, RBC at 2.98, Hgb at 9.5, HCT at 30.5, MCV at 102.3, RDW at 15.9, Neutro Abs at 9.8K, Lymphs Abs at 230.3K, Mono Abs at 4.9K, Calcium at 8.6, Total Bilirubin at 1.4. 01/14/2020 Uric acid at 4.9  On review of systems, pt denies fevers, chills, night sweats, rash, low appetite, sleeplessness and any other symptoms.   MEDICAL HISTORY:  Past Medical History:  Diagnosis Date  . B-cell lymphoma ([place]) 06/2017     SURGICAL HISTORY: Past Surgical History:  Procedure Laterality Date  . left inguinal hernia repair Left   . SPLENECTOMY, TOTAL  02/13/2018    SOCIAL HISTORY: Social History   Socioeconomic History  . Marital status: Single    Spouse name: Not on file  . Number of children: Not on file  . Years of education: Not on file  . Highest education level: Not on file  Occupational History  . Not on file  Tobacco Use  . Smoking status: Never Smoker  . Smokeless tobacco: Never Used  Substance and Sexual Activity  . Alcohol use: Yes    Comment: glass of wine occasionally  . Drug use: No  . Sexual activity: Not on file  Other Topics Concern  . Not on file  Social History Narrative  . Not  on file   Social Determinants of Health   Financial Resource Strain:   . Difficulty of Paying Living Expenses:   Food Insecurity:   . Worried About Charity fundraiser in the Last Year:   . Arboriculturist in the Last Year:   Transportation Needs:   . Film/video editor (Medical):   Marland Kitchen Lack of Transportation (Non-Medical):   Physical Activity:   . Days of Exercise per Week:   . Minutes of Exercise per Session:   Stress:   . Feeling of Stress :   Social Connections:   . Frequency of Communication with Friends and Family:   . Frequency of Social Gatherings with Friends and Family:   . Attends Religious Services:   .  Active Member of Clubs or Organizations:   . Attends Archivist Meetings:   Marland Kitchen Marital Status:   Intimate Partner Violence:   . Fear of Current or Ex-Partner:   . Emotionally Abused:   Marland Kitchen Physically Abused:   . Sexually Abused:     FAMILY HISTORY: Sister - lung cancer Mother -Alzheimers dementia  ALLERGIES:  is allergic to tape.  MEDICATIONS:  Current Outpatient Medications  Medication Sig Dispense Refill  . acetaminophen (TYLENOL) 500 MG tablet Take 500 mg by mouth every 8 (eight) hours as needed for moderate pain.     Marland Kitchen acyclovir (ZOVIRAX) 200 MG capsule TAKE 2 CAPSULES BY MOUTH TWICE DAILY 120 capsule 1  . acyclovir (ZOVIRAX) 200 MG capsule Take by mouth.    Marland Kitchen albuterol (PROVENTIL HFA;VENTOLIN HFA) 108 (90 Base) MCG/ACT inhaler Inhale 1-2 puffs into the lungs every 6 (six) hours as needed for wheezing or shortness of breath. 1 Inhaler 0  . allopurinol (ZYLOPRIM) 100 MG tablet Take 1 tablet (100 mg total) by mouth 2 (two) times daily. Starting 5 days prior to 1st dose of Rituxan 60 tablet 0  . aspirin 81 MG EC tablet Take by mouth.    . benzonatate (TESSALON) 200 MG capsule Take 1 capsule (200 mg total) by mouth 3 (three) times daily as needed for cough. 30 capsule 0  . chlorhexidine (PERIDEX) 0.12 % solution SWISH WITH 15 MLS IN MOUTH OR THROAT THEN SPIT --USE TWICE DAILY AS DIRECTED 473 mL 1  . chlorpheniramine-HYDROcodone (TUSSIONEX PENNKINETIC ER) 10-8 MG/5ML SUER Take 5 mLs by mouth every 12 (twelve) hours as needed for cough. 60 mL 0  . Cyanocobalamin (VITAMIN B 12 PO) Take by mouth.    . Diphenhyd-Hydrocort-Nystatin (FIRST-DUKES MOUTHWASH MT) Take by mouth.    . docusate sodium (COLACE) 100 MG capsule Take 100 mg by mouth 2 (two) times daily.    Marland Kitchen docusate sodium (COLACE) 50 MG capsule Take by mouth.    . fluticasone (FLONASE) 50 MCG/ACT nasal spray Place 2 sprays into both nostrils daily. 16 g 0  . folic acid (FOLVITE) 1 MG tablet Take by mouth.    . gabapentin  (NEURONTIN) 100 MG capsule 1 cap (172m) po twice daily (with breakfast and lunch) and 3 caps (3070m po at bedtime 150 capsule 1  . hydrOXYzine (ATARAX/VISTARIL) 25 MG tablet Take 1-2 tablets (25-50 mg total) by mouth every 4 (four) hours as needed. 20 tablet 0  . hydrOXYzine (VISTARIL) 25 MG capsule Take by mouth.    . Loratadine 10 MG CAPS Take 10 mg by mouth daily.    . magic mouthwash w/lidocaine SOLN Take 5 mLs by mouth 4 (four) times daily as needed for mouth pain. Swish and  spit 1 Part viscous lidocaine 2% 1 Part Maalox (do not substitute Kaopectate) 1 Part diphenhydramine 12.5 mg per 5 ml elixir. 120 mL 2  . moxifloxacin (VIGAMOX) 0.5 % ophthalmic solution     . oxyCODONE (OXY IR/ROXICODONE) 5 MG immediate release tablet Take by mouth.    . Prednisolon-Gatiflox-Bromfenac 1-0.5-0.075 % SUSP Place 1 drop into the right eye 4 (four) times daily.    . prednisoLONE acetate (PRED FORTE) 1 % ophthalmic suspension     . predniSONE (DELTASONE) 20 MG tablet Take 2 tablets (40 mg total) by mouth daily with breakfast. Starting 5 days prior to 1st dose of Rituxan 60 tablet 0  . senna (SENOKOT) 8.6 MG TABS tablet Take 1 tablet by mouth daily as needed for mild constipation.     Marland Kitchen Spacer/Aero-Holding Chambers (AEROCHAMBER PLUS) inhaler Use as instructed 1 each 2  . triamcinolone cream (KENALOG) 0.1 % Apply 1 application topically 2 (two) times daily. 45 g 0   No current facility-administered medications for this visit.    REVIEW OF SYSTEMS:   A 10+ POINT REVIEW OF SYSTEMS WAS OBTAINED including neurology, dermatology, psychiatry, cardiac, respiratory, lymph, extremities, GI, GU, Musculoskeletal, constitutional, breasts, reproductive, HEENT.  All pertinent positives are noted in the HPI.  All others are negative.   PHYSICAL EXAMINATION:  ECOG PERFORMANCE STATUS: 1 BP 140/75 (BP Location: Left Arm, Patient Position: Sitting)   Pulse 62   Temp 97.8 F (36.6 C) (Temporal)   Resp 18   Ht '5\' 6"'   (1.676 m)   Wt 164 lb 9.6 oz (74.7 kg)   SpO2 100%   BMI 26.57 kg/m    GENERAL:alert, in no acute distress and comfortable SKIN: no acute rashes, no significant lesions EYES: conjunctiva are pink and non-injected, sclera anicteric OROPHARYNX: MMM, no exudates, no oropharyngeal erythema or ulceration NECK: supple, no JVD LYMPH:  no palpable lymphadenopathy in the cervical, axillary or inguinal regions LUNGS: clear to auscultation b/l with normal respiratory effort HEART: regular rate & rhythm ABDOMEN:  normoactive bowel sounds , non tender, not distended. No palpable hepatosplenomegaly.  Extremity: no pedal edema PSYCH: alert & oriented x 3 with fluent speech NEURO: no focal motor/sensory deficits  LABORATORY DATA:   I have reviewed the data as listed   CBC Latest Ref Rng & Units 12/16/2019 11/11/2019 08/12/2019  WBC 4.0 - 10.5 K/uL 236.1(HH) 215.1(HH) 121.0(HH)  Hemoglobin 12.0 - 15.0 g/dL 10.4(L) 10.9(L) 12.7  Hematocrit 36.0 - 46.0 % 33.2(L) 34.3(L) 40.0  Platelets 150 - 400 K/uL 219 234 223  ANC 4.1k . CBC    Component Value Date/Time   WBC 236.1 (HH) 12/16/2019 1005   RBC 3.27 (L) 12/16/2019 1005   HGB 10.4 (L) 12/16/2019 1005   HGB 8.0 (L) 02/07/2018 0905   HGB 9.1 (L) 09/14/2017 0835   HCT 33.2 (L) 12/16/2019 1005   HCT 35.2 03/05/2018 1331   HCT 29.0 (L) 09/14/2017 0835   PLT 219 12/16/2019 1005   PLT 50 (L) 02/07/2018 0905   PLT 46 (L) 09/14/2017 0835   MCV 101.5 (H) 12/16/2019 1005   MCV 92.1 09/14/2017 0835   MCH 31.8 12/16/2019 1005   MCHC 31.3 12/16/2019 1005   RDW 15.3 12/16/2019 1005   RDW 18.2 (H) 09/14/2017 0835   LYMPHSABS 224.3 (H) 12/16/2019 1005   LYMPHSABS 2.0 09/14/2017 0835   MONOABS 4.7 (H) 12/16/2019 1005   MONOABS 0.3 09/14/2017 0835   EOSABS 2.4 (H) 12/16/2019 1005   EOSABS 0.0 09/14/2017 6659  BASOSABS 0.0 12/16/2019 1005   BASOSABS 0.0 09/14/2017 0835    . CMP Latest Ref Rng & Units 12/16/2019 11/11/2019 08/12/2019  Glucose 70 -  99 mg/dL 82 80 95  BUN 8 - 23 mg/dL '17 17 12  ' Creatinine 0.44 - 1.00 mg/dL 0.78 0.73 0.80  Sodium 135 - 145 mmol/L 143 141 142  Potassium 3.5 - 5.1 mmol/L 4.3 4.0 4.1  Chloride 98 - 111 mmol/L 109 107 105  CO2 22 - 32 mmol/L '25 26 24  ' Calcium 8.9 - 10.3 mg/dL 8.9 8.6(L) 8.9  Total Protein 6.5 - 8.1 g/dL 7.0 7.0 7.2  Total Bilirubin 0.3 - 1.2 mg/dL 1.3(H) 1.1 1.2  Alkaline Phos 38 - 126 U/L 73 71 64  AST 15 - 41 U/L '25 19 19  ' ALT 0 - 44 U/L '20 16 17   ' Component     Latest Ref Rng & Units 07/13/2017  LDH     125 - 245 U/L 232  Hep C Virus Ab     0.0 - 0.9 s/co ratio <0.1  HIV Screen 4th Generation wRfx     Non Reactive Non Reactive  Hep B Core Ab, Tot     Negative Negative  Hepatitis B Surface Ag     Negative Negative   Component     Latest Ref Rng & Units 07/28/2017  LDH     125 - 245 U/L 200  Uric Acid, Serum     2.6 - 7.4 mg/dl 6.0  Phosphorus     2.5 - 4.5 mg/dL 3.8   03/05/18 Pathology Report Splenectomy:      RADIOGRAPHIC STUDIES: I have personally reviewed the radiological images as listed and agreed with the findings in the report. CT Chest W Contrast  Result Date: 12/31/2019 CLINICAL DATA:  Splenic marginal zone lymphoma. Restaging. EXAM: CT CHEST, ABDOMEN, AND PELVIS WITH CONTRAST TECHNIQUE: Multidetector CT imaging of the chest, abdomen and pelvis was performed following the standard protocol during bolus administration of intravenous contrast. CONTRAST:  170m OMNIPAQUE IOHEXOL 300 MG/ML  SOLN COMPARISON:  Abdomen/pelvis CT 12/21/2017. FINDINGS: CT CHEST FINDINGS Cardiovascular: The heart size is normal. No substantial pericardial effusion. No thoracic aortic aneurysm. Atherosclerotic calcification is noted in the wall of the thoracic aorta. Enlargement of the pulmonary outflow tract and main pulmonary arteries suggests pulmonary arterial hypertension. Mediastinum/Nodes: Scattered small mediastinal lymph nodes evident without lymphadenopathy. There is no hilar  lymphadenopathy. The esophagus has normal imaging features. There is no axillary lymphadenopathy. Lungs/Pleura: No suspicious pulmonary nodule or mass. No focal airspace consolidation. No pleural effusion. Musculoskeletal: No worrisome lytic or sclerotic osseous abnormality. CT ABDOMEN PELVIS FINDINGS Hepatobiliary: No suspicious focal abnormality within the liver parenchyma. Liver measures 22.5 cm craniocaudal length, enlarged. There is no evidence for gallstones, gallbladder wall thickening, or pericholecystic fluid. No intrahepatic or extrahepatic biliary dilation. Pancreas: No focal mass lesion. No dilatation of the main duct. No intraparenchymal cyst. No peripancreatic edema. Spleen: Prior splenectomy. Adrenals/Urinary Tract: No adrenal nodule or mass. Tiny hypoattenuating lesions in the right kidney are too small to characterize but likely benign. Left kidney unremarkable. No evidence for hydroureter. The urinary bladder appears normal for the degree of distention. Stomach/Bowel: Stomach is unremarkable. No gastric wall thickening. No evidence of outlet obstruction. Duodenum is normally positioned as is the ligament of Treitz. No small bowel wall thickening. No small bowel dilatation. The terminal ileum is normal. The appendix is normal. No gross colonic mass. No colonic wall thickening. Diverticular changes are noted in  the left colon without evidence of diverticulitis. Vascular/Lymphatic: There is abdominal aortic atherosclerosis without aneurysm. Upper abdominal lymphadenopathy generates mass-effect on the portal vein although the portal vein does remain patent. Tiny juxta cardiac and juxta diaphragmatic lymph nodes are evident. Bulky lymphadenopathy is identified in the upper abdomen. Index 3.0 cm short axis gastrohepatic ligament lymph node is visible on 61/2. Bulky portal caval node measuring 3.4 cm short axis is visible on 67/2. 2.7 cm short axis central mesenteric node is visible on 74/2. Small  retroperitoneal lymph nodes are seen in the upper abdomen. No pelvic sidewall lymphadenopathy. No inguinal lymphadenopathy. Reproductive: The uterus is unremarkable.  There is no adnexal mass. Other: No intraperitoneal free fluid. Musculoskeletal: No worrisome lytic or sclerotic osseous abnormality. IMPRESSION: 1. 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 Although there are scattered tiny lymph nodes in the chest, no lymphadenopathy by size criteria in the thorax or pelvis. 2. Hepatomegaly. 3. Prior splenectomy. 4. Enlargement of the pulmonary outflow tract and main pulmonary arteries suggests pulmonary arterial hypertension. 5.  Aortic Atherosclerois (ICD10-170.0) Electronically Signed   By: Misty Stanley M.D.   On: 12/31/2019 10:05   CT Abdomen Pelvis W Contrast  Result Date: 12/31/2019 CLINICAL DATA:  Splenic marginal zone lymphoma. Restaging. EXAM: CT CHEST, ABDOMEN, AND PELVIS WITH CONTRAST TECHNIQUE: Multidetector CT imaging of the chest, abdomen and pelvis was performed following the standard protocol during bolus administration of intravenous contrast. CONTRAST:  166m OMNIPAQUE IOHEXOL 300 MG/ML  SOLN COMPARISON:  Abdomen/pelvis CT 12/21/2017. FINDINGS: CT CHEST FINDINGS Cardiovascular: The heart size is normal. No substantial pericardial effusion. No thoracic aortic aneurysm. Atherosclerotic calcification is noted in the wall of the thoracic aorta. Enlargement of the pulmonary outflow tract and main pulmonary arteries suggests pulmonary arterial hypertension. Mediastinum/Nodes: Scattered small mediastinal lymph nodes evident without lymphadenopathy. There is no hilar lymphadenopathy. The esophagus has normal imaging features. There is no axillary lymphadenopathy. Lungs/Pleura: No suspicious pulmonary nodule or mass. No focal airspace consolidation. No pleural effusion. Musculoskeletal: No worrisome lytic or sclerotic osseous abnormality. CT ABDOMEN PELVIS FINDINGS  Hepatobiliary: No suspicious focal abnormality within the liver parenchyma. Liver measures 22.5 cm craniocaudal length, enlarged. There is no evidence for gallstones, gallbladder wall thickening, or pericholecystic fluid. No intrahepatic or extrahepatic biliary dilation. Pancreas: No focal mass lesion. No dilatation of the main duct. No intraparenchymal cyst. No peripancreatic edema. Spleen: Prior splenectomy. Adrenals/Urinary Tract: No adrenal nodule or mass. Tiny hypoattenuating lesions in the right kidney are too small to characterize but likely benign. Left kidney unremarkable. No evidence for hydroureter. The urinary bladder appears normal for the degree of distention. Stomach/Bowel: Stomach is unremarkable. No gastric wall thickening. No evidence of outlet obstruction. Duodenum is normally positioned as is the ligament of Treitz. No small bowel wall thickening. No small bowel dilatation. The terminal ileum is normal. The appendix is normal. No gross colonic mass. No colonic wall thickening. Diverticular changes are noted in the left colon without evidence of diverticulitis. Vascular/Lymphatic: There is abdominal aortic atherosclerosis without aneurysm. Upper abdominal lymphadenopathy generates mass-effect on the portal vein although the portal vein does remain patent. Tiny juxta cardiac and juxta diaphragmatic lymph nodes are evident. Bulky lymphadenopathy is identified in the upper abdomen. Index 3.0 cm short axis gastrohepatic ligament lymph node is visible on 61/2. Bulky portal caval node measuring 3.4 cm short axis is visible on 67/2. 2.7 cm short axis central mesenteric node is visible on 74/2. Small retroperitoneal lymph nodes are seen in the upper abdomen.  No pelvic sidewall lymphadenopathy. No inguinal lymphadenopathy. Reproductive: The uterus is unremarkable.  There is no adnexal mass. Other: No intraperitoneal free fluid. Musculoskeletal: No worrisome lytic or sclerotic osseous abnormality.  IMPRESSION: 1. 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 Although there are scattered tiny lymph nodes in the chest, no lymphadenopathy by size criteria in the thorax or pelvis. 2. Hepatomegaly. 3. Prior splenectomy. 4. Enlargement of the pulmonary outflow tract and main pulmonary arteries suggests pulmonary arterial hypertension. 5.  Aortic Atherosclerois (ICD10-170.0) Electronically Signed   By: Misty Stanley M.D.   On: 12/31/2019 10:05     ASSESSMENT & PLAN:   Michelle Cohen is a wonderful 69 y.o. 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  Her clinical presentation is also representative of this and that this may have been present over months to possibly years.  -With her pattern of involvement and results thus far it is likely that she has splenic marginal zone type lymphoma.  -PET scan on 07/25/2017 with results showing: Massive splenomegaly, splenic volume 5770 cubic cm, with homogeneous low grade activity throughout the spleen equal to that of the physiologic activity in the liver, and above the background blood pool activity in the mediastinum. No pathologic adenopathy identified. No bony involvement noted.   -CT A/p on 08/23/17 with results of: Marked splenomegaly without substantial interval change in splenic size/volume. Borderline lymphadenopathy in the hepatoduodenal ligament. Aortic Atherosclerosis. I discussed this with the patient in great detail.  -CT A/p on 09/15/17 shows significant splenomegaly shows minimal smaller change in size, with no evidence of bowel  obstruction.   2. Pancytopenia s/p thrombocytopenia and anemia  Due to SMZL and hypersplenism Now resolved  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 Had to complete Rituxan at lower rate. Patient would not be a candidate for rapid infusion protocol and would need to keep the next infusion at the tolerated rate and not rate escalate. Plan  Completed with extensive pre-medications.  4. Oral mucositis/HSV outbreak - resolved -continue with acyclovir as needed   PLAN: -Discussed pt labwork today, 01/14/20; WBC and PLT are steady, increasing anemia, blood chemistries are stable, Uric acid is WNL -Discussed 12/31/2019 CT C/A/P ([id number]) ([id number]) which revealed "1. 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 Although there are scattered tiny lymph nodes in the chest, no lymphadenopathy by size criteria in the thorax or pelvis. 2. Hepatomegaly. 3. Prior splenectomy. 4. Enlargement of the pulmonary outflow tract and main pulmonary arteries suggests pulmonary arterial hypertension. 5.  Aortic Atherosclerois (ICD10-170.0)." -Discussed the role of supportive medicines -Advised pt that Bendamustine + Rituxan would give a more complete response, but not necessary due to indolent nature of pt's lymphoma. -The pt has no prohibitive toxicities from beginning Rituxan tomorrow. Will cap Rituxan infusion rate at 200 mg/hr to prevent treatment reactions. (given previous h/o reactions) -Will get repeat CT C/A/P in 3-4 cycles    FOLLOW UP: plz schedule next 3 weekly doses of Rituxan with labs and MD visit   The total time spent  in the appt was 30 minutes and more than 50% was on counseling and direct patient cares.  All of the patient's questions were answered with apparent satisfaction. The patient knows to call the clinic with any problems,  questions or concerns.   Sullivan Lone MD [place] AAHIVMS [facility] Hematology/Oncology Physician [facility]  (Office):       [phone] (Work cell):  [phone] (Fax):           [phone]  I, Yevette Edwards, am acting as a scribe for Dr. Sullivan Lone.   .I have reviewed the above documentation for accuracy and completeness, and I agree with the above. Brunetta Genera MD

## 2020-01-15 ENCOUNTER — Inpatient Hospital Stay (HOSPITAL_BASED_OUTPATIENT_CLINIC_OR_DEPARTMENT_OTHER): Payer: Medicare Other | Admitting: Medical

## 2020-01-15 ENCOUNTER — Inpatient Hospital Stay: Payer: Medicare Other

## 2020-01-15 ENCOUNTER — Other Ambulatory Visit: Payer: Self-pay

## 2020-01-15 ENCOUNTER — Other Ambulatory Visit: Payer: Self-pay | Admitting: Hematology

## 2020-01-15 VITALS — BP 131/68 | HR 64 | Temp 98.7°F | Resp 18

## 2020-01-15 DIAGNOSIS — G4700 Insomnia, unspecified: Secondary | ICD-10-CM | POA: Diagnosis not present

## 2020-01-15 DIAGNOSIS — D61818 Other pancytopenia: Secondary | ICD-10-CM | POA: Diagnosis not present

## 2020-01-15 DIAGNOSIS — Z5112 Encounter for antineoplastic immunotherapy: Secondary | ICD-10-CM | POA: Diagnosis not present

## 2020-01-15 DIAGNOSIS — C8307 Small cell B-cell lymphoma, spleen: Secondary | ICD-10-CM

## 2020-01-15 DIAGNOSIS — Z7189 Other specified counseling: Secondary | ICD-10-CM

## 2020-01-15 DIAGNOSIS — R634 Abnormal weight loss: Secondary | ICD-10-CM | POA: Diagnosis not present

## 2020-01-15 DIAGNOSIS — Z79899 Other long term (current) drug therapy: Secondary | ICD-10-CM | POA: Diagnosis not present

## 2020-01-15 MED ORDER — DIPHENHYDRAMINE HCL 50 MG/ML IJ SOLN
INTRAMUSCULAR | Status: AC
  Filled 2020-01-15: qty 1

## 2020-01-15 MED ORDER — SODIUM CHLORIDE 0.9 % IV SOLN
Freq: Once | INTRAVENOUS | Status: AC
  Filled 2020-01-15: qty 250

## 2020-01-15 MED ORDER — MONTELUKAST SODIUM 10 MG PO TABS
ORAL_TABLET | ORAL | Status: AC
  Filled 2020-01-15: qty 1

## 2020-01-15 MED ORDER — ACETAMINOPHEN 500 MG PO TABS
1000.0000 mg | ORAL_TABLET | Freq: Once | ORAL | Status: AC
  Administered 2020-01-15: 10:00:00 1000 mg via ORAL

## 2020-01-15 MED ORDER — ACETAMINOPHEN 500 MG PO TABS
ORAL_TABLET | ORAL | Status: AC
  Filled 2020-01-15: qty 2

## 2020-01-15 MED ORDER — SODIUM CHLORIDE 0.9 % IV SOLN
375.0000 mg/m2 | Freq: Once | INTRAVENOUS | Status: AC
  Administered 2020-01-15: 700 mg via INTRAVENOUS
  Filled 2020-01-15: qty 50

## 2020-01-15 MED ORDER — METHYLPREDNISOLONE SODIUM SUCC 125 MG IJ SOLR
INTRAMUSCULAR | Status: AC
  Filled 2020-01-15: qty 2

## 2020-01-15 MED ORDER — FAMOTIDINE IN NACL 20-0.9 MG/50ML-% IV SOLN
INTRAVENOUS | Status: AC
  Filled 2020-01-15: qty 50

## 2020-01-15 MED ORDER — FAMOTIDINE IN NACL 20-0.9 MG/50ML-% IV SOLN
20.0000 mg | Freq: Once | INTRAVENOUS | Status: AC
  Administered 2020-01-15: 10:00:00 20 mg via INTRAVENOUS

## 2020-01-15 MED ORDER — DIPHENHYDRAMINE HCL 50 MG/ML IJ SOLN
50.0000 mg | Freq: Once | INTRAMUSCULAR | Status: AC
  Administered 2020-01-15: 10:00:00 50 mg via INTRAVENOUS

## 2020-01-15 MED ORDER — LORAZEPAM 2 MG/ML IJ SOLN
INTRAMUSCULAR | Status: AC
  Filled 2020-01-15: qty 1

## 2020-01-15 MED ORDER — LORAZEPAM 1 MG PO TABS
ORAL_TABLET | ORAL | Status: AC
  Filled 2020-01-15: qty 1

## 2020-01-15 MED ORDER — ONDANSETRON HCL 4 MG/2ML IJ SOLN
INTRAMUSCULAR | Status: AC
  Filled 2020-01-15: qty 2

## 2020-01-15 MED ORDER — ONDANSETRON HCL 4 MG/2ML IJ SOLN
4.0000 mg | Freq: Once | INTRAMUSCULAR | Status: AC
  Administered 2020-01-15: 4 mg via INTRAVENOUS

## 2020-01-15 MED ORDER — MONTELUKAST SODIUM 10 MG PO TABS
10.0000 mg | ORAL_TABLET | Freq: Once | ORAL | Status: AC
  Administered 2020-01-15: 10:00:00 10 mg via ORAL

## 2020-01-15 MED ORDER — METHYLPREDNISOLONE SODIUM SUCC 125 MG IJ SOLR
125.0000 mg | Freq: Once | INTRAMUSCULAR | Status: AC
  Administered 2020-01-15: 125 mg via INTRAVENOUS

## 2020-01-15 MED ORDER — MORPHINE SULFATE 4 MG/ML IJ SOLN
2.0000 mg | Freq: Once | INTRAMUSCULAR | Status: AC
  Administered 2020-01-15: 11:00:00 2 mg via INTRAVENOUS
  Filled 2020-01-15: qty 1

## 2020-01-15 MED ORDER — LORAZEPAM 1 MG PO TABS
0.5000 mg | ORAL_TABLET | Freq: Once | ORAL | Status: AC
  Administered 2020-01-15: 11:00:00 0.5 mg via ORAL

## 2020-01-15 MED ORDER — MORPHINE SULFATE (PF) 4 MG/ML IV SOLN
INTRAVENOUS | Status: AC
  Filled 2020-01-15: qty 1

## 2020-01-15 MED FILL — ACYCLOVIR 200 MG CAP: 200 | 30 days supply | Qty: 120 | Fill #0

## 2020-01-15 NOTE — Patient Instructions (Signed)
Rituximab injection What is this medicine? RITUXIMAB (ri TUX i mab) is a monoclonal antibody. It is used to treat certain types of cancer like non-Hodgkin lymphoma and chronic lymphocytic leukemia. It is also used to treat rheumatoid arthritis, granulomatosis with polyangiitis (or Wegener's granulomatosis), microscopic polyangiitis, and pemphigus vulgaris. This medicine may be used for other purposes; ask your health care provider or pharmacist if you have questions. COMMON BRAND NAME(S): Rituxan, RUXIENCE What should I tell my health care provider before I take this medicine? They need to know if you have any of these conditions:  heart disease  infection (especially a virus infection such as hepatitis B, chickenpox, cold sores, or herpes)  immune system problems  irregular heartbeat  kidney disease  low blood counts, like low white cell, platelet, or red cell counts  lung or breathing disease, like asthma  recently received or scheduled to receive a vaccine  an unusual or allergic reaction to rituximab, other medicines, foods, dyes, or preservatives  pregnant or trying to get pregnant  breast-feeding How should I use this medicine? This medicine is for infusion into a vein. It is administered in a hospital or clinic by a specially trained health care professional. A special MedGuide will be given to you by the pharmacist with each prescription and refill. Be sure to read this information carefully each time. Talk to your pediatrician regarding the use of this medicine in children. This medicine is not approved for use in children. Overdosage: If you think you have taken too much of this medicine contact a poison control center or emergency room at once. NOTE: This medicine is only for you. Do not share this medicine with others. What if I miss a dose? It is important not to miss a dose. Call your doctor or health care professional if you are unable to keep an appointment. What  may interact with this medicine?  cisplatin  live virus vaccines This list may not describe all possible interactions. Give your health care provider a list of all the medicines, herbs, non-prescription drugs, or dietary supplements you use. Also tell them if you smoke, drink alcohol, or use illegal drugs. Some items may interact with your medicine. What should I watch for while using this medicine? Your condition will be monitored carefully while you are receiving this medicine. You may need blood work done while you are taking this medicine. This medicine can cause serious allergic reactions. To reduce your risk you may need to take medicine before treatment with this medicine. Take your medicine as directed. In some patients, this medicine may cause a serious brain infection that may cause death. If you have any problems seeing, thinking, speaking, walking, or standing, tell your healthcare professional right away. If you cannot reach your healthcare professional, urgently seek other source of medical care. Call your doctor or health care professional for advice if you get a fever, chills or sore throat, or other symptoms of a cold or flu. Do not treat yourself. This drug decreases your body's ability to fight infections. Try to avoid being around people who are sick. Do not become pregnant while taking this medicine or for at least 12 months after stopping it. Women should inform their doctor if they wish to become pregnant or think they might be pregnant. There is a potential for serious side effects to an unborn child. Talk to your health care professional or pharmacist for more information. Do not breast-feed an infant while taking this medicine or for at   least 6 months after stopping it. What side effects may I notice from receiving this medicine? Side effects that you should report to your doctor or health care professional as soon as possible:  allergic reactions like skin rash, itching or  hives; swelling of the face, lips, or tongue  breathing problems  chest pain  changes in vision  diarrhea  headache with fever, neck stiffness, sensitivity to light, nausea, or confusion  fast, irregular heartbeat  loss of memory  low blood counts - this medicine may decrease the number of white blood cells, red blood cells and platelets. You may be at increased risk for infections and bleeding.  mouth sores  problems with balance, talking, or walking  redness, blistering, peeling or loosening of the skin, including inside the mouth  signs of infection - fever or chills, cough, sore throat, pain or difficulty passing urine  signs and symptoms of kidney injury like trouble passing urine or change in the amount of urine  signs and symptoms of liver injury like dark yellow or brown urine; general ill feeling or flu-like symptoms; light-colored stools; loss of appetite; nausea; right upper belly pain; unusually weak or tired; yellowing of the eyes or skin  signs and symptoms of low blood pressure like dizziness; feeling faint or lightheaded, falls; unusually weak or tired  stomach pain  swelling of the ankles, feet, hands  unusual bleeding or bruising  vomiting Side effects that usually do not require medical attention (report to your doctor or health care professional if they continue or are bothersome):  headache  joint pain  muscle cramps or muscle pain  nausea  tiredness This list may not describe all possible side effects. Call your doctor for medical advice about side effects. You may report side effects to FDA at 1-800-FDA-1088. Where should I keep my medicine? This drug is given in a hospital or clinic and will not be stored at home. NOTE: This sheet is a summary. It may not cover all possible information. If you have questions about this medicine, talk to your doctor, pharmacist, or health care provider.  2020 Elsevier/Gold Standard (2018-10-24  22:01:36)  

## 2020-01-15 NOTE — Telephone Encounter (Signed)
Patient request refill

## 2020-01-15 NOTE — Progress Notes (Signed)
The patient was seen in the infusion room today as she was receiving treatment with rituximab.  She has been given 2 mg of morphine IV and subsequently had an onset of nausea and vomiting.  She was given 8 mg of Zofran IV with resolution of her vomiting.  Sandi Mealy, MHS, PA-C Physician Assistant

## 2020-01-16 NOTE — Progress Notes (Signed)
Pharmacist Chemotherapy Monitoring - Follow Up Assessment    I verify that I have reviewed each item in the below checklist:  . Regimen for the patient is scheduled for the appropriate day and plan matches scheduled date. Marland Kitchen Appropriate non-routine labs are ordered dependent on drug ordered. . If applicable, additional medications reviewed and ordered per protocol based on lifetime cumulative doses and/or treatment regimen.   Plan for follow-up and/or issues identified: No . I-vent associated with next due treatment: No . MD and/or nursing notified: No  Vangie Henthorn D 01/16/2020 2:34 PM

## 2020-01-20 ENCOUNTER — Encounter: Payer: Self-pay | Admitting: Hematology

## 2020-01-20 ENCOUNTER — Other Ambulatory Visit: Payer: Self-pay | Admitting: *Deleted

## 2020-01-20 DIAGNOSIS — C8307 Small cell B-cell lymphoma, spleen: Secondary | ICD-10-CM

## 2020-01-20 MED ORDER — MAGIC MOUTHWASH W/LIDOCAINE: 5.0000 mL | Freq: Four times a day (QID) | ORAL | 0 refills | Status: AC | PRN

## 2020-01-20 MED FILL — MAGIC MW LIDO/MYL/NYS 1:1:1: 6 days supply | Qty: 120 | Fill #0

## 2020-01-22 ENCOUNTER — Inpatient Hospital Stay: Payer: Medicare Other

## 2020-01-22 ENCOUNTER — Other Ambulatory Visit: Payer: Self-pay

## 2020-01-22 ENCOUNTER — Inpatient Hospital Stay (HOSPITAL_BASED_OUTPATIENT_CLINIC_OR_DEPARTMENT_OTHER): Payer: Medicare Other | Admitting: Hematology

## 2020-01-22 VITALS — BP 138/68 | HR 63 | Temp 98.5°F | Resp 18 | Ht 66.0 in | Wt 160.4 lb

## 2020-01-22 VITALS — BP 110/49 | HR 70 | Temp 98.3°F | Resp 18

## 2020-01-22 DIAGNOSIS — C8307 Small cell B-cell lymphoma, spleen: Secondary | ICD-10-CM

## 2020-01-22 DIAGNOSIS — Z5112 Encounter for antineoplastic immunotherapy: Secondary | ICD-10-CM

## 2020-01-22 DIAGNOSIS — Z79899 Other long term (current) drug therapy: Secondary | ICD-10-CM | POA: Diagnosis not present

## 2020-01-22 DIAGNOSIS — G4700 Insomnia, unspecified: Secondary | ICD-10-CM | POA: Diagnosis not present

## 2020-01-22 DIAGNOSIS — D61818 Other pancytopenia: Secondary | ICD-10-CM | POA: Diagnosis not present

## 2020-01-22 DIAGNOSIS — R634 Abnormal weight loss: Secondary | ICD-10-CM | POA: Diagnosis not present

## 2020-01-22 DIAGNOSIS — D649 Anemia, unspecified: Secondary | ICD-10-CM

## 2020-01-22 DIAGNOSIS — Z7189 Other specified counseling: Secondary | ICD-10-CM

## 2020-01-22 LAB — CBC WITH DIFFERENTIAL/PLATELET
Abs Immature Granulocytes: 0 10*3/uL (ref 0.00–0.07)
Basophils Absolute: 0 10*3/uL (ref 0.0–0.1)
Basophils Relative: 0 %
Eosinophils Absolute: 0.9 10*3/uL — ABNORMAL HIGH (ref 0.0–0.5)
Eosinophils Relative: 1 %
HCT: 32.2 % — ABNORMAL LOW (ref 36.0–46.0)
Hemoglobin: 10.3 g/dL — ABNORMAL LOW (ref 12.0–15.0)
Lymphocytes Relative: 88 %
Lymphs Abs: 80.6 10*3/uL — ABNORMAL HIGH (ref 0.7–4.0)
MCH: 31.8 pg (ref 26.0–34.0)
MCHC: 32 g/dL (ref 30.0–36.0)
MCV: 99.4 fL (ref 80.0–100.0)
Monocytes Absolute: 2.7 10*3/uL — ABNORMAL HIGH (ref 0.1–1.0)
Monocytes Relative: 3 %
Neutro Abs: 7.3 10*3/uL (ref 1.7–7.7)
Neutrophils Relative %: 8 %
Platelets: 188 10*3/uL (ref 150–400)
RBC: 3.24 MIL/uL — ABNORMAL LOW (ref 3.87–5.11)
RDW: 15.5 % (ref 11.5–15.5)
WBC: 91.6 10*3/uL (ref 4.0–10.5)
nRBC: 0 % (ref 0.0–0.2)

## 2020-01-22 LAB — CMP (CANCER CENTER ONLY)
ALT: 16 U/L (ref 0–44)
AST: 17 U/L (ref 15–41)
Albumin: 3.5 g/dL (ref 3.5–5.0)
Alkaline Phosphatase: 57 U/L (ref 38–126)
Anion gap: 9 (ref 5–15)
BUN: 12 mg/dL (ref 8–23)
CO2: 24 mmol/L (ref 22–32)
Calcium: 8.7 mg/dL — ABNORMAL LOW (ref 8.9–10.3)
Chloride: 107 mmol/L (ref 98–111)
Creatinine: 0.74 mg/dL (ref 0.44–1.00)
GFR, Est AFR Am: >60 mL/min (ref >60)
GFR, Estimated: >60 mL/min (ref >60)
Glucose, Bld: 108 mg/dL — ABNORMAL HIGH (ref 70–99)
Potassium: 3.6 mmol/L (ref 3.5–5.1)
Sodium: 140 mmol/L (ref 135–145)
Total Bilirubin: 1.3 mg/dL — ABNORMAL HIGH (ref 0.3–1.2)
Total Protein: 6.8 g/dL (ref 6.5–8.1)

## 2020-01-22 LAB — URIC ACID: Uric Acid, Serum: 6.8 mg/dL (ref 2.5–7.1)

## 2020-01-22 MED ORDER — MONTELUKAST SODIUM 10 MG PO TABS
ORAL_TABLET | ORAL | Status: AC
  Filled 2020-01-22: qty 1

## 2020-01-22 MED ORDER — ONDANSETRON HCL 4 MG/2ML IJ SOLN
INTRAMUSCULAR | Status: AC
  Filled 2020-01-22: qty 2

## 2020-01-22 MED ORDER — FAMOTIDINE IN NACL 20-0.9 MG/50ML-% IV SOLN
INTRAVENOUS | Status: AC
  Filled 2020-01-22: qty 50

## 2020-01-22 MED ORDER — SODIUM CHLORIDE 0.9 % IV SOLN
375.0000 mg/m2 | Freq: Once | INTRAVENOUS | Status: AC
  Administered 2020-01-22: 12:00:00 700 mg via INTRAVENOUS
  Filled 2020-01-22: qty 20

## 2020-01-22 MED ORDER — LORAZEPAM 1 MG PO TABS
0.5000 mg | ORAL_TABLET | Freq: Once | ORAL | Status: AC
  Administered 2020-01-22: 11:00:00 0.5 mg via ORAL

## 2020-01-22 MED ORDER — DIPHENHYDRAMINE HCL 50 MG/ML IJ SOLN
50.0000 mg | Freq: Once | INTRAMUSCULAR | Status: AC
  Administered 2020-01-22: 11:00:00 50 mg via INTRAVENOUS

## 2020-01-22 MED ORDER — SODIUM CHLORIDE 0.9 % IV SOLN
Freq: Once | INTRAVENOUS | Status: AC
  Filled 2020-01-22: qty 250

## 2020-01-22 MED ORDER — METHYLPREDNISOLONE SODIUM SUCC 125 MG IJ SOLR
125.0000 mg | Freq: Once | INTRAMUSCULAR | Status: AC
  Administered 2020-01-22: 11:00:00 125 mg via INTRAVENOUS

## 2020-01-22 MED ORDER — FAMOTIDINE IN NACL 20-0.9 MG/50ML-% IV SOLN
20.0000 mg | Freq: Once | INTRAVENOUS | Status: AC
  Administered 2020-01-22: 11:00:00 20 mg via INTRAVENOUS

## 2020-01-22 MED ORDER — LORAZEPAM 1 MG PO TABS
ORAL_TABLET | ORAL | Status: AC
  Filled 2020-01-22: qty 1

## 2020-01-22 MED ORDER — MORPHINE SULFATE 4 MG/ML IJ SOLN
2.0000 mg | Freq: Once | INTRAMUSCULAR | Status: AC
  Administered 2020-01-22: 11:00:00 2 mg via INTRAVENOUS
  Filled 2020-01-22: qty 1

## 2020-01-22 MED ORDER — ACETAMINOPHEN 500 MG PO TABS
ORAL_TABLET | ORAL | Status: AC
  Filled 2020-01-22: qty 2

## 2020-01-22 MED ORDER — LORAZEPAM 2 MG/ML IJ SOLN
INTRAMUSCULAR | Status: AC
  Filled 2020-01-22: qty 1

## 2020-01-22 MED ORDER — DIPHENHYDRAMINE HCL 50 MG/ML IJ SOLN
INTRAMUSCULAR | Status: AC
  Filled 2020-01-22: qty 1

## 2020-01-22 MED ORDER — MORPHINE SULFATE (PF) 4 MG/ML IV SOLN
INTRAVENOUS | Status: AC
  Filled 2020-01-22: qty 1

## 2020-01-22 MED ORDER — METHYLPREDNISOLONE SODIUM SUCC 125 MG IJ SOLR
INTRAMUSCULAR | Status: AC
  Filled 2020-01-22: qty 2

## 2020-01-22 MED ORDER — ACETAMINOPHEN 500 MG PO TABS
1000.0000 mg | ORAL_TABLET | Freq: Once | ORAL | Status: AC
  Administered 2020-01-22: 11:00:00 1000 mg via ORAL

## 2020-01-22 MED ORDER — ONDANSETRON HCL 4 MG/2ML IJ SOLN
4.0000 mg | Freq: Once | INTRAMUSCULAR | Status: AC
  Administered 2020-01-22: 12:00:00 4 mg via INTRAVENOUS

## 2020-01-22 MED ORDER — MONTELUKAST SODIUM 10 MG PO TABS
10.0000 mg | ORAL_TABLET | Freq: Once | ORAL | Status: AC
  Administered 2020-01-22: 11:00:00 10 mg via ORAL

## 2020-01-22 NOTE — Progress Notes (Signed)
HEMATOLOGY/ONCOLOGY FOLLOW UP NOTE  Date of Service:  01/22/20     Patient Care Team: Patient, No Pcp Per as PCP - General (General Practice)  CHIEF COMPLAINTS F/u for continued management of SMZL   HISTORY OF PRESENTING ILLNESS:   Michelle Cohen is a wonderful 69 y.o. 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 She presents to her appointment today with her partner of 21 years. She states that she moved to the area ~2 years ago secondary to her sister's diagnosis of lung cancer (unfortunately, now passed) and taking care of her mother's Alzheimer's. Generally, prior to this issue she has been very healthy and without chronic medical conditions and not on chronic medical therapies/treatments.   Initially, the patient was [facility] for abdominal pain and significant hernia. She reports that she was visiting in [place] and while flying there she had an acute onset of sweats and left lower quadrant pain. She reports that her hernia was not present at that time, but as she continued to fly her hernia had become more noticeable and enlarged in her lower abdomen. Additionally, her partner reports that her episode of hydrosis was very significant, stating that it was pooling below her. On their arrival to [facility] on 06/21/17, a CT A/P was performed which showed an incarcerated hernia. Of note, her CT showed massive splenomegaly at 32cm in greatest dimension. Manual reduction of her hernia was attempted while in the ED but was unsuccessful. Pre-operative notes state that following intubation manual reduction was performed successfully and hernia was repaired laparoscopically. CT CAP was performed as well which was without the presence of additional surrounding LAD. She did have flow cytometry performed during her admission as well which showed B-cell lymphocytes which were monoclonal. A bone marrow biopsy was  also examined which showed that it was markedly hypercellular (~95%) with prominent interstitial infiltrate of small lymphocytes.   Prior to her diagnosis, she mostly felt typically well overall. She states that she did suffer from insomnia which she related to over-thinking at the time regarding her familial situation. She did experience some fatigue and weight loss with this as well. Her partner reports that she noticed her weight loss approximately 73moago, which she quantifies at approximately 30lbs since onset. Towards the end of this six month periods and just prior to the appearance of her hernia the patient began to notice some bothersome abdominal distension. She also noticed some significant fatigue and she would nap for longer periods following her daily workouts. Additionally, her partner would notice that she would have issues with breathing and this appeared irregular and more labored, especially at night while laying down. She also mentions that she did experience intermittent night sweats throughout this six month period as well. She denies lightheadedness, dizziness, abdominal pain, back pain, fever, chills, abnormal bruising/bleeding, bloody stools, epistaxis, hematuria, gingival bleeding, or other bleeding issues within this period. She has not received any blood transfusions and has not previously had a tattoo; she is unconcerned with prior HepC exposure.   Since her hernia surgery, she reports that she feels great symptomatically overall and much improved. She does have some issues with abdominal bloating and discomfort, but this has been manageable. No overt abdominal pain.   Throughout all of this, she has attempted to try to remain within good spirits. Losing her sister recently has been hard, but she has been working through it  with her family; however, her partner voices that they do not have strong social support in this area as most of their support is in [place] where they  previously lived. She has been working through her sister's death with grief counseling which has been helping her.    She has never previously smoked, but she reports some moderate second-hand smoke through her father at a younger age. Her father did die of a PE, but she is unaware of any other diagnoses of clotting disorders or cancers within her family. No h/o alcohol abuse, chemical/radiation exposures. She worked previously in Industrial/product designer.   PREVIOUS TREATMENT:  Rituximab weekly x4 beginning on 07/21/17-08/11/17 Received additional Rituxan weekly x 4 doses Rituximab  Weekly for 4 weeks starting 10/05/17 - completed 10/2017. S/p splenectomy at [place] with Dr Shon Hough.  CURRENT TREATMENT:  Active surveillance  Contemplating retreating with Rituxan in 1-2 months   INTERIM HISTORY:   Michelle Cohen returns today for scheduled follow up of her SMZL. We are joined today by her wife. She is here for C2D1 of Rituxan. The patient's last visit with [place] was on 01/15/2020. The pt reports that she is doing well overall.  The pt reports that she felt hot and nauseous after being given Morphine. She began vomiting and felt better 5-10 minutes after receiving Zofran. Pt had no rigors after treatment, but had a headache, low-grade fever, nausea, vomiting, and flushing for 2-3 days after. Pt has a rash that is beginning to resolve. She has lost some of her appetite but has remained well-hydrated. Pt took Allopurinol for five days leading to her first treatment, but has not taken it since.   Lab results today (01/22/20) of CBC w/diff and CMP is as follows: all values are WNL except for WBC at 91.6K, RBC at 3.24, Hgb at 10.3, HCT at 32.2, Lymphs Abs at 80.6K, Mono Abs at 2.7K, Eos Abs at 0.9K, Glucose at 108, Calcium at 8.7, Total Bilirubin at 1.3. 01/22/2020 Uric acid at 6.8  On review of systems, pt reports improving rash, low appetite, fatigue and denies mouth sores, abdominal pain,  leg swelling and any other symptoms.   MEDICAL HISTORY:  Past Medical History:  Diagnosis Date  . B-cell lymphoma ([place]) 06/2017     SURGICAL HISTORY: Past Surgical History:  Procedure Laterality Date  . left inguinal hernia repair Left   . SPLENECTOMY, TOTAL  02/13/2018    SOCIAL HISTORY: Social History   Socioeconomic History  . Marital status: Single    Spouse name: Not on file  . Number of children: Not on file  . Years of education: Not on file  . Highest education level: Not on file  Occupational History  . Not on file  Tobacco Use  . Smoking status: Never Smoker  . Smokeless tobacco: Never Used  Substance and Sexual Activity  . Alcohol use: Yes    Comment: glass of wine occasionally  . Drug use: No  . Sexual activity: Not on file  Other Topics Concern  . Not on file  Social History Narrative  . Not on file   Social Determinants of Health   Financial Resource Strain:   . Difficulty of Paying Living Expenses:   Food Insecurity:   . Worried About Charity fundraiser in the Last Year:   . Arboriculturist in the Last Year:   Transportation Needs:   . Film/video editor (Medical):   Marland Kitchen Lack of Transportation (  Non-Medical):   Physical Activity:   . Days of Exercise per Week:   . Minutes of Exercise per Session:   Stress:   . Feeling of Stress :   Social Connections:   . Frequency of Communication with Friends and Family:   . Frequency of Social Gatherings with Friends and Family:   . Attends Religious Services:   . Active Member of Clubs or Organizations:   . Attends Archivist Meetings:   Marland Kitchen Marital Status:   Intimate Partner Violence:   . Fear of Current or Ex-Partner:   . Emotionally Abused:   Marland Kitchen Physically Abused:   . Sexually Abused:     FAMILY HISTORY: Sister - lung cancer Mother -Alzheimers dementia  ALLERGIES:  is allergic to tape.  MEDICATIONS:  Current Outpatient Medications  Medication Sig Dispense Refill  .  acetaminophen (TYLENOL) 500 MG tablet Take 500 mg by mouth every 8 (eight) hours as needed for moderate pain.     Marland Kitchen acyclovir (ZOVIRAX) 200 MG capsule Take by mouth.    Marland Kitchen acyclovir (ZOVIRAX) 200 MG capsule TAKE 2 CAPSULES BY MOUTH TWICE DAILY 120 capsule 1  . albuterol (PROVENTIL HFA;VENTOLIN HFA) 108 (90 Base) MCG/ACT inhaler Inhale 1-2 puffs into the lungs every 6 (six) hours as needed for wheezing or shortness of breath. 1 Inhaler 0  . allopurinol (ZYLOPRIM) 100 MG tablet Take 1 tablet (100 mg total) by mouth 2 (two) times daily. Starting 5 days prior to 1st dose of Rituxan 60 tablet 0  . aspirin 81 MG EC tablet Take by mouth.    . benzonatate (TESSALON) 200 MG capsule Take 1 capsule (200 mg total) by mouth 3 (three) times daily as needed for cough. 30 capsule 0  . chlorhexidine (PERIDEX) 0.12 % solution SWISH WITH 15 MLS IN MOUTH OR THROAT THEN SPIT --USE TWICE DAILY AS DIRECTED 473 mL 1  . chlorpheniramine-HYDROcodone (TUSSIONEX PENNKINETIC ER) 10-8 MG/5ML SUER Take 5 mLs by mouth every 12 (twelve) hours as needed for cough. 60 mL 0  . Cyanocobalamin (VITAMIN B 12 PO) Take by mouth.    . Diphenhyd-Hydrocort-Nystatin (FIRST-DUKES MOUTHWASH MT) Take by mouth.    . docusate sodium (COLACE) 100 MG capsule Take 100 mg by mouth 2 (two) times daily.    Marland Kitchen docusate sodium (COLACE) 50 MG capsule Take by mouth.    . fluticasone (FLONASE) 50 MCG/ACT nasal spray Place 2 sprays into both nostrils daily. 16 g 0  . folic acid (FOLVITE) 1 MG tablet Take by mouth.    . hydrOXYzine (ATARAX/VISTARIL) 25 MG tablet Take 1-2 tablets (25-50 mg total) by mouth every 4 (four) hours as needed. 20 tablet 0  . hydrOXYzine (VISTARIL) 25 MG capsule Take by mouth.    . Loratadine 10 MG CAPS Take 10 mg by mouth daily.    . magic mouthwash w/lidocaine SOLN Take 5 mLs by mouth 4 (four) times daily as needed for mouth pain. Swish and spit 1 Part viscous lidocaine 2% 1 Part Maalox (do not substitute Kaopectate) 1 Part  diphenhydramine 12.5 mg per 5 ml elixir. 120 mL 2  . magic mouthwash w/lidocaine SOLN Take 5 mLs by mouth 4 (four) times daily as needed for mouth pain. Swish and spit 120 mL 0  . moxifloxacin (VIGAMOX) 0.5 % ophthalmic solution     . oxyCODONE (OXY IR/ROXICODONE) 5 MG immediate release tablet Take by mouth.    . Prednisolon-Gatiflox-Bromfenac 1-0.5-0.075 % SUSP Place 1 drop into the right eye 4 (four) times  daily.    . prednisoLONE acetate (PRED FORTE) 1 % ophthalmic suspension     . predniSONE (DELTASONE) 20 MG tablet Take 2 tablets (40 mg total) by mouth daily with breakfast. Starting 5 days prior to 1st dose of Rituxan 60 tablet 0  . senna (SENOKOT) 8.6 MG TABS tablet Take 1 tablet by mouth daily as needed for mild constipation.     Marland Kitchen Spacer/Aero-Holding Chambers (AEROCHAMBER PLUS) inhaler Use as instructed 1 each 2  . triamcinolone cream (KENALOG) 0.1 % Apply 1 application topically 2 (two) times daily. 45 g 0   No current facility-administered medications for this visit.    REVIEW OF SYSTEMS:   A 10+ POINT REVIEW OF SYSTEMS WAS OBTAINED including neurology, dermatology, psychiatry, cardiac, respiratory, lymph, extremities, GI, GU, Musculoskeletal, constitutional, breasts, reproductive, HEENT.  All pertinent positives are noted in the HPI.  All others are negative.   PHYSICAL EXAMINATION:  ECOG PERFORMANCE STATUS: 1 BP 138/68 (BP Location: Left Arm, Patient Position: Sitting)   Pulse 63   Temp 98.5 F (36.9 C) (Temporal)   Resp 18   Ht _0  (1.676 m)   Wt 160 lb 6.4 oz (72.8 kg)   SpO2 100%   BMI 25.89 kg/m    GENERAL:alert, in no acute distress and comfortable SKIN: no significant lesions, localized papular rash over right shoulder  EYES: conjunctiva are pink and non-injected, sclera anicteric OROPHARYNX: MMM, no exudates, no oropharyngeal erythema or ulceration NECK: supple, no JVD LYMPH:  no palpable lymphadenopathy in the cervical, axillary or inguinal regions LUNGS:  clear to auscultation b/l with normal respiratory effort HEART: regular rate & rhythm ABDOMEN:  normoactive bowel sounds , non tender, not distended. No palpable hepatosplenomegaly.  Extremity: no pedal edema PSYCH: alert & oriented x 3 with fluent speech NEURO: no focal motor/sensory deficits  LABORATORY DATA:   I have reviewed the data as listed   CBC Latest Ref Rng & Units 01/22/2020 01/14/2020 12/16/2019  WBC 4.0 - 10.5 K/uL 91.6(HH) 245.0(HH) 236.1(HH)  Hemoglobin 12.0 - 15.0 g/dL 10.3(L) 9.5(L) 10.4(L)  Hematocrit 36.0 - 46.0 % 32.2(L) 30.5(L) 33.2(L)  Platelets 150 - 400 K/uL 188 209 219  ANC 4.1k . CBC    Component Value Date/Time   WBC 91.6 (HH) 01/22/2020 0845   RBC 3.24 (L) 01/22/2020 0845   HGB 10.3 (L) 01/22/2020 0845   HGB 8.0 (L) 02/07/2018 0905   HGB 9.1 (L) 09/14/2017 0835   HCT 32.2 (L) 01/22/2020 0845   HCT 35.2 03/05/2018 1331   HCT 29.0 (L) 09/14/2017 0835   PLT 188 01/22/2020 0845   PLT 50 (L) 02/07/2018 0905   PLT 46 (L) 09/14/2017 0835   MCV 99.4 01/22/2020 0845   MCV 92.1 09/14/2017 0835   MCH 31.8 01/22/2020 0845   MCHC 32.0 01/22/2020 0845   RDW 15.5 01/22/2020 0845   RDW 18.2 (H) 09/14/2017 0835   LYMPHSABS 80.6 (H) 01/22/2020 0845   LYMPHSABS 2.0 09/14/2017 0835   MONOABS 2.7 (H) 01/22/2020 0845   MONOABS 0.3 09/14/2017 0835   EOSABS 0.9 (H) 01/22/2020 0845   EOSABS 0.0 09/14/2017 0835   BASOSABS 0.0 01/22/2020 0845   BASOSABS 0.0 09/14/2017 0835    . CMP Latest Ref Rng & Units 01/22/2020 01/14/2020 12/16/2019  Glucose 70 - 99 mg/dL 108(H) 93 82  BUN 8 - 23 mg/dL _1 Creatinine 0.44 - 1.00 mg/dL 0.74 0.76 0.78  Sodium 135 - 145 mmol/L 140 142 143  Potassium 3.5 - 5.1  mmol/L 3.6 3.9 4.3  Chloride 98 - 111 mmol/L 107 107 109  CO2 22 - 32 mmol/L _0 Calcium 8.9 - 10.3 mg/dL 8.7(L) 8.6(L) 8.9  Total Protein 6.5 - 8.1 g/dL 6.8 6.8 7.0  Total Bilirubin 0.3 - 1.2 mg/dL 1.3(H) 1.4(H) 1.3(H)  Alkaline Phos 38 - 126 U/L 57 58 73   AST 15 - 41 U/L _1 ALT 0 - 44 U/L _2 Component     Latest Ref Rng & Units 07/13/2017  LDH     125 - 245 U/L 232  Hep C Virus Ab     0.0 - 0.9 s/co ratio <0.1  HIV Screen 4th Generation wRfx     Non Reactive Non Reactive  Hep B Core Ab, Tot     Negative Negative  Hepatitis B Surface Ag     Negative Negative   Component     Latest Ref Rng & Units 07/28/2017  LDH     125 - 245 U/L 200  Uric Acid, Serum     2.6 - 7.4 mg/dl 6.0  Phosphorus     2.5 - 4.5 mg/dL 3.8   03/05/18 Pathology Report Splenectomy:      RADIOGRAPHIC STUDIES: I have personally reviewed the radiological images as listed and agreed with the findings in the report. CT Chest W Contrast  Result Date: 12/31/2019 CLINICAL DATA:  Splenic marginal zone lymphoma. Restaging. EXAM: CT CHEST, ABDOMEN, AND PELVIS WITH CONTRAST TECHNIQUE: Multidetector CT imaging of the chest, abdomen and pelvis was performed following the standard protocol during bolus administration of intravenous contrast. CONTRAST:  177m OMNIPAQUE IOHEXOL 300 MG/ML  SOLN COMPARISON:  Abdomen/pelvis CT 12/21/2017. FINDINGS: CT CHEST FINDINGS Cardiovascular: The heart size is normal. No substantial pericardial effusion. No thoracic aortic aneurysm. Atherosclerotic calcification is noted in the wall of the thoracic aorta. Enlargement of the pulmonary outflow tract and main pulmonary arteries suggests pulmonary arterial hypertension. Mediastinum/Nodes: Scattered small mediastinal lymph nodes evident without lymphadenopathy. There is no hilar lymphadenopathy. The esophagus has normal imaging features. There is no axillary lymphadenopathy. Lungs/Pleura: No suspicious pulmonary nodule or mass. No focal airspace consolidation. No pleural effusion. Musculoskeletal: No worrisome lytic or sclerotic osseous abnormality. CT ABDOMEN PELVIS FINDINGS Hepatobiliary: No suspicious focal abnormality within the liver parenchyma. Liver measures 22.5 cm craniocaudal  length, enlarged. There is no evidence for gallstones, gallbladder wall thickening, or pericholecystic fluid. No intrahepatic or extrahepatic biliary dilation. Pancreas: No focal mass lesion. No dilatation of the main duct. No intraparenchymal cyst. No peripancreatic edema. Spleen: Prior splenectomy. Adrenals/Urinary Tract: No adrenal nodule or mass. Tiny hypoattenuating lesions in the right kidney are too small to characterize but likely benign. Left kidney unremarkable. No evidence for hydroureter. The urinary bladder appears normal for the degree of distention. Stomach/Bowel: Stomach is unremarkable. No gastric wall thickening. No evidence of outlet obstruction. Duodenum is normally positioned as is the ligament of Treitz. No small bowel wall thickening. No small bowel dilatation. The terminal ileum is normal. The appendix is normal. No gross colonic mass. No colonic wall thickening. Diverticular changes are noted in the left colon without evidence of diverticulitis. Vascular/Lymphatic: There is abdominal aortic atherosclerosis without aneurysm. Upper abdominal lymphadenopathy generates mass-effect on the portal vein although the portal vein does remain patent. Tiny juxta cardiac and juxta diaphragmatic lymph nodes are evident. Bulky lymphadenopathy is identified in the upper abdomen. Index 3.0 cm short axis gastrohepatic ligament lymph node is visible on 61/2. Bulky portal  caval node measuring 3.4 cm short axis is visible on 67/2. 2.7 cm short axis central mesenteric node is visible on 74/2. Small retroperitoneal lymph nodes are seen in the upper abdomen. No pelvic sidewall lymphadenopathy. No inguinal lymphadenopathy. Reproductive: The uterus is unremarkable.  There is no adnexal mass. Other: No intraperitoneal free fluid. Musculoskeletal: No worrisome lytic or sclerotic osseous abnormality. IMPRESSION: 1. 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 Although there are  scattered tiny lymph nodes in the chest, no lymphadenopathy by size criteria in the thorax or pelvis. 2. Hepatomegaly. 3. Prior splenectomy. 4. Enlargement of the pulmonary outflow tract and main pulmonary arteries suggests pulmonary arterial hypertension. 5.  Aortic Atherosclerois (ICD10-170.0) Electronically Signed   By: Misty Stanley M.D.   On: 12/31/2019 10:05   CT Abdomen Pelvis W Contrast  Result Date: 12/31/2019 CLINICAL DATA:  Splenic marginal zone lymphoma. Restaging. EXAM: CT CHEST, ABDOMEN, AND PELVIS WITH CONTRAST TECHNIQUE: Multidetector CT imaging of the chest, abdomen and pelvis was performed following the standard protocol during bolus administration of intravenous contrast. CONTRAST:  160m OMNIPAQUE IOHEXOL 300 MG/ML  SOLN COMPARISON:  Abdomen/pelvis CT 12/21/2017. FINDINGS: CT CHEST FINDINGS Cardiovascular: The heart size is normal. No substantial pericardial effusion. No thoracic aortic aneurysm. Atherosclerotic calcification is noted in the wall of the thoracic aorta. Enlargement of the pulmonary outflow tract and main pulmonary arteries suggests pulmonary arterial hypertension. Mediastinum/Nodes: Scattered small mediastinal lymph nodes evident without lymphadenopathy. There is no hilar lymphadenopathy. The esophagus has normal imaging features. There is no axillary lymphadenopathy. Lungs/Pleura: No suspicious pulmonary nodule or mass. No focal airspace consolidation. No pleural effusion. Musculoskeletal: No worrisome lytic or sclerotic osseous abnormality. CT ABDOMEN PELVIS FINDINGS Hepatobiliary: No suspicious focal abnormality within the liver parenchyma. Liver measures 22.5 cm craniocaudal length, enlarged. There is no evidence for gallstones, gallbladder wall thickening, or pericholecystic fluid. No intrahepatic or extrahepatic biliary dilation. Pancreas: No focal mass lesion. No dilatation of the main duct. No intraparenchymal cyst. No peripancreatic edema. Spleen: Prior splenectomy.  Adrenals/Urinary Tract: No adrenal nodule or mass. Tiny hypoattenuating lesions in the right kidney are too small to characterize but likely benign. Left kidney unremarkable. No evidence for hydroureter. The urinary bladder appears normal for the degree of distention. Stomach/Bowel: Stomach is unremarkable. No gastric wall thickening. No evidence of outlet obstruction. Duodenum is normally positioned as is the ligament of Treitz. No small bowel wall thickening. No small bowel dilatation. The terminal ileum is normal. The appendix is normal. No gross colonic mass. No colonic wall thickening. Diverticular changes are noted in the left colon without evidence of diverticulitis. Vascular/Lymphatic: There is abdominal aortic atherosclerosis without aneurysm. Upper abdominal lymphadenopathy generates mass-effect on the portal vein although the portal vein does remain patent. Tiny juxta cardiac and juxta diaphragmatic lymph nodes are evident. Bulky lymphadenopathy is identified in the upper abdomen. Index 3.0 cm short axis gastrohepatic ligament lymph node is visible on 61/2. Bulky portal caval node measuring 3.4 cm short axis is visible on 67/2. 2.7 cm short axis central mesenteric node is visible on 74/2. Small retroperitoneal lymph nodes are seen in the upper abdomen. No pelvic sidewall lymphadenopathy. No inguinal lymphadenopathy. Reproductive: The uterus is unremarkable.  There is no adnexal mass. Other: No intraperitoneal free fluid. Musculoskeletal: No worrisome lytic or sclerotic osseous abnormality. IMPRESSION: 1. 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 Although there are scattered tiny lymph nodes in the chest, no lymphadenopathy by size criteria in the thorax or pelvis. 2.  Hepatomegaly. 3. Prior splenectomy. 4. Enlargement of the pulmonary outflow tract and main pulmonary arteries suggests pulmonary arterial hypertension. 5.  Aortic Atherosclerois (ICD10-170.0)  Electronically Signed   By: Misty Stanley M.D.   On: 12/31/2019 10:05     ASSESSMENT & PLAN:   Meiya Wisler is a wonderful 69 y.o. 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  Her clinical presentation is also representative of this and that this may have been present over months to possibly years.  -With her pattern of involvement and results thus far it is likely that she has splenic marginal zone type lymphoma.  -PET scan on 07/25/2017 with results showing: Massive splenomegaly, splenic volume 5770 cubic cm, with homogeneous low grade activity throughout the spleen equal to that of the physiologic activity in the liver, and above the background blood pool activity in the mediastinum. No pathologic adenopathy identified. No bony involvement noted.   -CT A/p on 08/23/17 with results of: Marked splenomegaly without substantial interval change in splenic size/volume. Borderline lymphadenopathy in the hepatoduodenal ligament. Aortic Atherosclerosis. I discussed this with the patient in great detail.  -CT A/p on 09/15/17 shows significant splenomegaly shows minimal smaller change in size, with no evidence of bowel obstruction.  -12/31/2019 CT C/A/P ([id number]) ([id number]) revealed "1. 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 Although there are scattered tiny lymph nodes in the chest, no lymphadenopathy by size criteria in the thorax or pelvis. 2. Hepatomegaly. 3. Prior splenectomy. 4. Enlargement of the pulmonary outflow tract and main pulmonary arteries suggests pulmonary arterial  hypertension. 5.  Aortic Atherosclerois (ICD10-170.0)."   2. Pancytopenia s/p thrombocytopenia and anemia  Due to SMZL and hypersplenism Now resolved  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 Had to complete Rituxan at lower rate. Patient would not be a candidate for rapid infusion protocol and would need to keep the next infusion at the tolerated rate and not rate escalate. Plan  Completed with extensive pre-medications.  4. Oral mucositis/HSV outbreak - resolved -continue with acyclovir as needed   PLAN: -Discussed pt labwork today, 01/22/20; WBC coming down, Hgb is improved, PLT are nml, blood chemistries are steady, Uric acid is WNL -The pt has no prohibitive toxicities from continuing C2D1 Rituxan at this time. Will cap Rituxan infusion rate at 200 mg/hr to prevent treatment reactions. (given previous h/o reactions) -Will premedicate with Zofran to help prevent N/V/D from Morphine  -Recommend pt restart low-dose Allopurinol    FOLLOW UP: F/u as per scheduled appointments for C3 and C4 of Rituxan   The total time spent in the appt was 20 minutes and more than 50% was on counseling and direct patient cares.  All of the patient's questions were answered with apparent satisfaction. The patient knows to call the clinic with any problems, questions or concerns.   Sullivan Lone MD [place] AAHIVMS [facility] Hematology/Oncology Physician [facility]  (Office):       [phone] (Work cell):  [phone] (Fax):           [phone]  I, Yevette Edwards, am acting as a Education administrator for Dr. Sullivan Lone.   .I have reviewed the above documentation for accuracy and completeness, and I agree with the above. Brunetta Genera MD

## 2020-01-23 NOTE — Progress Notes (Signed)
Pharmacist Chemotherapy Monitoring - Follow Up Assessment    I verify that I have reviewed each item in the below checklist:  . Regimen for the patient is scheduled for the appropriate day and plan matches scheduled date. Marland Kitchen Appropriate non-routine labs are ordered dependent on drug ordered. . If applicable, additional medications reviewed and ordered per protocol based on lifetime cumulative doses and/or treatment regimen.   Plan for follow-up and/or issues identified: No . I-vent associated with next due treatment: No . MD and/or nursing notified: No  Michelle Cohen K 01/23/2020 11:16 AM

## 2020-01-29 ENCOUNTER — Inpatient Hospital Stay: Payer: Medicare Other

## 2020-01-29 ENCOUNTER — Other Ambulatory Visit: Payer: Self-pay

## 2020-01-29 ENCOUNTER — Inpatient Hospital Stay: Payer: Medicare Other | Attending: Hematology | Admitting: Hematology

## 2020-01-29 VITALS — BP 130/77 | HR 69 | Temp 98.3°F | Resp 18 | Ht 66.0 in | Wt 158.7 lb

## 2020-01-29 VITALS — BP 141/77 | HR 80 | Temp 99.9°F | Resp 18

## 2020-01-29 DIAGNOSIS — Z79899 Other long term (current) drug therapy: Secondary | ICD-10-CM | POA: Insufficient documentation

## 2020-01-29 DIAGNOSIS — C8307 Small cell B-cell lymphoma, spleen: Secondary | ICD-10-CM | POA: Insufficient documentation

## 2020-01-29 DIAGNOSIS — I700 Atherosclerosis of aorta: Secondary | ICD-10-CM | POA: Insufficient documentation

## 2020-01-29 DIAGNOSIS — D61818 Other pancytopenia: Secondary | ICD-10-CM | POA: Diagnosis not present

## 2020-01-29 DIAGNOSIS — Z7982 Long term (current) use of aspirin: Secondary | ICD-10-CM | POA: Insufficient documentation

## 2020-01-29 DIAGNOSIS — R162 Hepatomegaly with splenomegaly, not elsewhere classified: Secondary | ICD-10-CM | POA: Diagnosis not present

## 2020-01-29 DIAGNOSIS — R634 Abnormal weight loss: Secondary | ICD-10-CM | POA: Insufficient documentation

## 2020-01-29 DIAGNOSIS — Z5112 Encounter for antineoplastic immunotherapy: Secondary | ICD-10-CM | POA: Diagnosis not present

## 2020-01-29 DIAGNOSIS — Z9081 Acquired absence of spleen: Secondary | ICD-10-CM | POA: Diagnosis not present

## 2020-01-29 DIAGNOSIS — Z7189 Other specified counseling: Secondary | ICD-10-CM

## 2020-01-29 DIAGNOSIS — R630 Anorexia: Secondary | ICD-10-CM | POA: Diagnosis not present

## 2020-01-29 DIAGNOSIS — R21 Rash and other nonspecific skin eruption: Secondary | ICD-10-CM | POA: Insufficient documentation

## 2020-01-29 DIAGNOSIS — D649 Anemia, unspecified: Secondary | ICD-10-CM

## 2020-01-29 LAB — CMP (CANCER CENTER ONLY)
ALT: 17 U/L (ref 0–44)
AST: 23 U/L (ref 15–41)
Albumin: 3.5 g/dL (ref 3.5–5.0)
Alkaline Phosphatase: 64 U/L (ref 38–126)
Anion gap: 10 (ref 5–15)
BUN: 12 mg/dL (ref 8–23)
CO2: 22 mmol/L (ref 22–32)
Calcium: 9.1 mg/dL (ref 8.9–10.3)
Chloride: 108 mmol/L (ref 98–111)
Creatinine: 0.79 mg/dL (ref 0.44–1.00)
GFR, Est AFR Am: >60 mL/min (ref >60)
GFR, Estimated: >60 mL/min (ref >60)
Glucose, Bld: 83 mg/dL (ref 70–99)
Potassium: 3.8 mmol/L (ref 3.5–5.1)
Sodium: 140 mmol/L (ref 135–145)
Total Bilirubin: 1.2 mg/dL (ref 0.3–1.2)
Total Protein: 7.4 g/dL (ref 6.5–8.1)

## 2020-01-29 LAB — CBC WITH DIFFERENTIAL/PLATELET
Abs Immature Granulocytes: 0 10*3/uL (ref 0.00–0.07)
Basophils Absolute: 0 10*3/uL (ref 0.0–0.1)
Basophils Relative: 0 %
Eosinophils Absolute: 3.1 10*3/uL — ABNORMAL HIGH (ref 0.0–0.5)
Eosinophils Relative: 4 %
HCT: 34.5 % — ABNORMAL LOW (ref 36.0–46.0)
Hemoglobin: 11.2 g/dL — ABNORMAL LOW (ref 12.0–15.0)
Lymphocytes Relative: 83 %
Lymphs Abs: 64.4 10*3/uL — ABNORMAL HIGH (ref 0.7–4.0)
MCH: 32.5 pg (ref 26.0–34.0)
MCHC: 32.5 g/dL (ref 30.0–36.0)
MCV: 100 fL (ref 80.0–100.0)
Monocytes Absolute: 1.6 10*3/uL — ABNORMAL HIGH (ref 0.1–1.0)
Monocytes Relative: 2 %
Neutro Abs: 8.5 10*3/uL — ABNORMAL HIGH (ref 1.7–7.7)
Neutrophils Relative %: 11 %
Platelets: 274 10*3/uL (ref 150–400)
RBC: 3.45 MIL/uL — ABNORMAL LOW (ref 3.87–5.11)
RDW: 15.9 % — ABNORMAL HIGH (ref 11.5–15.5)
WBC: 77.6 10*3/uL (ref 4.0–10.5)
nRBC: 0.1 % (ref 0.0–0.2)

## 2020-01-29 LAB — URIC ACID: Uric Acid, Serum: 5.2 mg/dL (ref 2.5–7.1)

## 2020-01-29 MED ORDER — LORAZEPAM 1 MG PO TABS
ORAL_TABLET | ORAL | Status: AC
  Filled 2020-01-29: qty 1

## 2020-01-29 MED ORDER — METHYLPREDNISOLONE SODIUM SUCC 125 MG IJ SOLR
INTRAMUSCULAR | Status: AC
  Filled 2020-01-29: qty 2

## 2020-01-29 MED ORDER — MORPHINE SULFATE (PF) 4 MG/ML IV SOLN
INTRAVENOUS | Status: AC
  Filled 2020-01-29: qty 1

## 2020-01-29 MED ORDER — SODIUM CHLORIDE 0.9 % IV SOLN
Freq: Once | INTRAVENOUS | Status: AC
  Filled 2020-01-29: qty 250

## 2020-01-29 MED ORDER — LORAZEPAM 1 MG PO TABS
0.5000 mg | ORAL_TABLET | Freq: Once | ORAL | Status: AC
  Administered 2020-01-29: 0.5 mg via ORAL

## 2020-01-29 MED ORDER — METHYLPREDNISOLONE SODIUM SUCC 125 MG IJ SOLR
125.0000 mg | Freq: Once | INTRAMUSCULAR | Status: AC
  Administered 2020-01-29: 125 mg via INTRAVENOUS

## 2020-01-29 MED ORDER — FAMOTIDINE IN NACL 20-0.9 MG/50ML-% IV SOLN
20.0000 mg | Freq: Once | INTRAVENOUS | Status: AC
  Administered 2020-01-29: 20 mg via INTRAVENOUS

## 2020-01-29 MED ORDER — ONDANSETRON HCL 4 MG/2ML IJ SOLN
INTRAMUSCULAR | Status: AC
  Filled 2020-01-29: qty 2

## 2020-01-29 MED ORDER — DIPHENHYDRAMINE HCL 50 MG/ML IJ SOLN
50.0000 mg | Freq: Once | INTRAMUSCULAR | Status: AC
  Administered 2020-01-29: 50 mg via INTRAVENOUS

## 2020-01-29 MED ORDER — ACETAMINOPHEN 500 MG PO TABS
ORAL_TABLET | ORAL | Status: AC
  Filled 2020-01-29: qty 2

## 2020-01-29 MED ORDER — ACETAMINOPHEN 500 MG PO TABS
1000.0000 mg | ORAL_TABLET | Freq: Once | ORAL | Status: AC
  Administered 2020-01-29: 1000 mg via ORAL

## 2020-01-29 MED ORDER — MONTELUKAST SODIUM 10 MG PO TABS
ORAL_TABLET | ORAL | Status: AC
  Filled 2020-01-29: qty 1

## 2020-01-29 MED ORDER — FAMOTIDINE IN NACL 20-0.9 MG/50ML-% IV SOLN
INTRAVENOUS | Status: AC
  Filled 2020-01-29: qty 50

## 2020-01-29 MED ORDER — MORPHINE SULFATE 4 MG/ML IJ SOLN
2.0000 mg | Freq: Once | INTRAMUSCULAR | Status: AC
  Administered 2020-01-29: 2 mg via INTRAVENOUS
  Filled 2020-01-29: qty 1

## 2020-01-29 MED ORDER — DIPHENHYDRAMINE HCL 50 MG/ML IJ SOLN
INTRAMUSCULAR | Status: AC
  Filled 2020-01-29: qty 1

## 2020-01-29 MED ORDER — ONDANSETRON HCL 4 MG/2ML IJ SOLN
4.0000 mg | Freq: Once | INTRAMUSCULAR | Status: AC
  Administered 2020-01-29: 4 mg via INTRAVENOUS

## 2020-01-29 MED ORDER — MONTELUKAST SODIUM 10 MG PO TABS
10.0000 mg | ORAL_TABLET | Freq: Once | ORAL | Status: AC
  Administered 2020-01-29: 10 mg via ORAL

## 2020-01-29 MED ORDER — SODIUM CHLORIDE 0.9 % IV SOLN
375.0000 mg/m2 | Freq: Once | INTRAVENOUS | Status: AC
  Administered 2020-01-29: 700 mg via INTRAVENOUS
  Filled 2020-01-29: qty 50

## 2020-01-29 NOTE — Patient Instructions (Signed)
[facility] Discharge Instructions for Patients Receiving Chemotherapy  Today you received the following chemotherapy agents:  Rituxan.  To help prevent nausea and vomiting after your treatment, we encourage you to take your nausea medication as directed.   If you develop nausea and vomiting that is not controlled by your nausea medication, call the clinic.   BELOW ARE SYMPTOMS THAT SHOULD BE REPORTED IMMEDIATELY:  *FEVER GREATER THAN 100.5 F  *CHILLS WITH OR WITHOUT FEVER  NAUSEA AND VOMITING THAT IS NOT CONTROLLED WITH YOUR NAUSEA MEDICATION  *UNUSUAL SHORTNESS OF BREATH  *UNUSUAL BRUISING OR BLEEDING  TENDERNESS IN MOUTH AND THROAT WITH OR WITHOUT PRESENCE OF ULCERS  *URINARY PROBLEMS  *BOWEL PROBLEMS  UNUSUAL RASH Items with * indicate a potential emergency and should be followed up as soon as possible.  Feel free to call the clinic should you have any questions or concerns. The clinic phone number is [phone].  Please show the CHEMO ALERT CARD at check-in to the Emergency Department and triage nurse.   

## 2020-01-29 NOTE — Progress Notes (Signed)
Ok to treat with CBC today per Kathlynn Grate, RN per MD Irene Limbo

## 2020-01-29 NOTE — Progress Notes (Signed)
HEMATOLOGY/ONCOLOGY FOLLOW UP NOTE  Date of Service:  01/29/20     Patient Care Team: Patient, No Pcp Per as PCP - General (General Practice)  CHIEF COMPLAINTS F/u for continued management of SMZL   HISTORY OF PRESENTING ILLNESS:   Michelle Cohen is a wonderful 69 y.o. female who has been referred to [place] from [facility] for evaluation and management of B-cell lymphoma. She presents to her appointment today with her partner of 21 years. She states that she moved to the area ~2 years ago secondary to her sister's diagnosis of lung cancer (unfortunately, now passed) and taking care of her mother's Alzheimer's. Generally, prior to this issue she has been very healthy and without chronic medical conditions and not on chronic medical therapies/treatments.   Initially, the patient was admitted to [facility] for abdominal pain and significant hernia. She reports that she was visiting in [place] and while flying there she had an acute onset of sweats and left lower quadrant pain. She reports that her hernia was not present at that time, but as she continued to fly her hernia had become more noticeable and enlarged in her lower abdomen. Additionally, her partner reports that her episode of hydrosis was very significant, stating that it was pooling below her. On their arrival to [facility] on 06/21/17, a CT A/P was performed which showed an incarcerated hernia. Of note, her CT showed massive splenomegaly at 32cm in greatest dimension. Manual reduction of her hernia was attempted while in the ED but was unsuccessful. Pre-operative notes state that following intubation manual reduction was performed successfully and hernia was repaired laparoscopically. CT CAP was performed as well which was without the presence of additional surrounding LAD. She did have flow cytometry performed during her admission as well which showed B-cell lymphocytes which were monoclonal. A bone marrow biopsy was  also examined which showed that it was markedly hypercellular (~95%) with prominent interstitial infiltrate of small lymphocytes.   Prior to her diagnosis, she mostly felt typically well overall. She states that she did suffer from insomnia which she related to over-thinking at the time regarding her familial situation. She did experience some fatigue and weight loss with this as well. Her partner reports that she noticed her weight loss approximately 10moago, which she quantifies at approximately 30lbs since onset. Towards the end of this six month periods and just prior to the appearance of her hernia the patient began to notice some bothersome abdominal distension. She also noticed some significant fatigue and she would nap for longer periods following her daily workouts. Additionally, her partner would notice that she would have issues with breathing and this appeared irregular and more labored, especially at night while laying down. She also mentions that she did experience intermittent night sweats throughout this six month period as well. She denies lightheadedness, dizziness, abdominal pain, back pain, fever, chills, abnormal bruising/bleeding, bloody stools, epistaxis, hematuria, gingival bleeding, or other bleeding issues within this period. She has not received any blood transfusions and has not previously had a tattoo; she is unconcerned with prior HepC exposure.   Since her hernia surgery, she reports that she feels great symptomatically overall and much improved. She does have some issues with abdominal bloating and discomfort, but this has been manageable. No overt abdominal pain.   Throughout all of this, she has attempted to try to remain within good spirits. Losing her sister recently has been hard, but she has been working through it  with her family; however, her partner voices that they do not have strong social support in this area as most of their support is in [place] where they  previously lived. She has been working through her sister's death with grief counseling which has been helping her.    She has never previously smoked, but she reports some moderate second-hand smoke through her father at a younger age. Her father did die of a PE, but she is unaware of any other diagnoses of clotting disorders or cancers within her family. No h/o alcohol abuse, chemical/radiation exposures. She worked previously in Industrial/product designer.   PREVIOUS TREATMENT:  Rituximab weekly x4 beginning on 07/21/17-08/11/17 Received additional Rituxan weekly x 4 doses Rituximab  Weekly for 4 weeks starting 10/05/17 - completed 10/2017. S/p splenectomy at [facility] with Dr Shon Hough.  CURRENT TREATMENT:  Active surveillance  Contemplating retreating with Rituxan in 1-2 months  INTERIM HISTORY:   Michelle Cohen returns today for scheduled follow up of her SMZL. We are joined today by her wife.  She is here for C3D1 of Rituxan. The patient's last visit with [place] was on 01/22/2020. The pt reports that she is doing well overall.  The pt reports that her rash has returned in a different area. She has continued to take Allopurinol and antihistamines as prescribed. Both of her legs hurt for a few days after her last treatment, but the pain was controlled reasonably well with Tylenol. Pt denies nausea or vomiting, but has been tired and sleeping well. Her appetite has been somewhat suppressed, but she is using supplements alongside her regular meals.   Lab results today (01/29/20) of CBC w/diff and CMP is as follows: all values are WNL except for WBC at 77.6K, RBC at 3.45, Hgb at 11.2, HCT at 34.5, RDW at 15.9, Neutro Abs at 8.5K, Lymphs Abs at 64.4K, Mono Abs at 1.6K, Eos Abs at 3.1K. 01/29/2020 Uric acid at 5.2  On review of systems, pt reports weight loss, low appetite, leg aches, rash, fatigue and denies sleeplessness, nausea, vomiting, mouth sores, fevers, chills, abdominal pain,  headaches and any other symptoms.    MEDICAL HISTORY:  Past Medical History:  Diagnosis Date  . B-cell lymphoma ([place]) 06/2017     SURGICAL HISTORY: Past Surgical History:  Procedure Laterality Date  . left inguinal hernia repair Left   . SPLENECTOMY, TOTAL  02/13/2018    SOCIAL HISTORY: Social History   Socioeconomic History  . Marital status: Single    Spouse name: Not on file  . Number of children: Not on file  . Years of education: Not on file  . Highest education level: Not on file  Occupational History  . Not on file  Tobacco Use  . Smoking status: Never Smoker  . Smokeless tobacco: Never Used  Substance and Sexual Activity  . Alcohol use: Yes    Comment: glass of wine occasionally  . Drug use: No  . Sexual activity: Not on file  Other Topics Concern  . Not on file  Social History Narrative  . Not on file   Social Determinants of Health   Financial Resource Strain:   . Difficulty of Paying Living Expenses:   Food Insecurity:   . Worried About Charity fundraiser in the Last Year:   . Arboriculturist in the Last Year:   Transportation Needs:   . Film/video editor (Medical):   Marland Kitchen Lack of Transportation (Non-Medical):   Physical Activity:   .  Days of Exercise per Week:   . Minutes of Exercise per Session:   Stress:   . Feeling of Stress :   Social Connections:   . Frequency of Communication with Friends and Family:   . Frequency of Social Gatherings with Friends and Family:   . Attends Religious Services:   . Active Member of Clubs or Organizations:   . Attends Archivist Meetings:   Marland Kitchen Marital Status:   Intimate Partner Violence:   . Fear of Current or Ex-Partner:   . Emotionally Abused:   Marland Kitchen Physically Abused:   . Sexually Abused:     FAMILY HISTORY: Sister - lung cancer Mother -Alzheimers dementia  ALLERGIES:  is allergic to tape.  MEDICATIONS:  Current Outpatient Medications  Medication Sig Dispense Refill  .  acetaminophen (TYLENOL) 500 MG tablet Take 500 mg by mouth every 8 (eight) hours as needed for moderate pain.     Marland Kitchen acyclovir (ZOVIRAX) 200 MG capsule Take by mouth.    Marland Kitchen acyclovir (ZOVIRAX) 200 MG capsule TAKE 2 CAPSULES BY MOUTH TWICE DAILY 120 capsule 1  . albuterol (PROVENTIL HFA;VENTOLIN HFA) 108 (90 Base) MCG/ACT inhaler Inhale 1-2 puffs into the lungs every 6 (six) hours as needed for wheezing or shortness of breath. 1 Inhaler 0  . allopurinol (ZYLOPRIM) 100 MG tablet Take 1 tablet (100 mg total) by mouth 2 (two) times daily. Starting 5 days prior to 1st dose of Rituxan 60 tablet 0  . aspirin 81 MG EC tablet Take by mouth.    . benzonatate (TESSALON) 200 MG capsule Take 1 capsule (200 mg total) by mouth 3 (three) times daily as needed for cough. 30 capsule 0  . chlorhexidine (PERIDEX) 0.12 % solution SWISH WITH 15 MLS IN MOUTH OR THROAT THEN SPIT --USE TWICE DAILY AS DIRECTED 473 mL 1  . chlorpheniramine-HYDROcodone (TUSSIONEX PENNKINETIC ER) 10-8 MG/5ML SUER Take 5 mLs by mouth every 12 (twelve) hours as needed for cough. 60 mL 0  . Cyanocobalamin (VITAMIN B 12 PO) Take by mouth.    . Diphenhyd-Hydrocort-Nystatin (FIRST-DUKES MOUTHWASH MT) Take by mouth.    . docusate sodium (COLACE) 100 MG capsule Take 100 mg by mouth 2 (two) times daily.    Marland Kitchen docusate sodium (COLACE) 50 MG capsule Take by mouth.    . fluticasone (FLONASE) 50 MCG/ACT nasal spray Place 2 sprays into both nostrils daily. 16 g 0  . folic acid (FOLVITE) 1 MG tablet Take by mouth.    . hydrOXYzine (ATARAX/VISTARIL) 25 MG tablet Take 1-2 tablets (25-50 mg total) by mouth every 4 (four) hours as needed. 20 tablet 0  . Loratadine 10 MG CAPS Take 10 mg by mouth daily.    . magic mouthwash w/lidocaine SOLN Take 5 mLs by mouth 4 (four) times daily as needed for mouth pain. Swish and spit 1 Part viscous lidocaine 2% 1 Part Maalox (do not substitute Kaopectate) 1 Part diphenhydramine 12.5 mg per 5 ml elixir. 120 mL 2  . magic  mouthwash w/lidocaine SOLN Take 5 mLs by mouth 4 (four) times daily as needed for mouth pain. Swish and spit 120 mL 0  . moxifloxacin (VIGAMOX) 0.5 % ophthalmic solution     . oxyCODONE (OXY IR/ROXICODONE) 5 MG immediate release tablet Take by mouth.    . Prednisolon-Gatiflox-Bromfenac 1-0.5-0.075 % SUSP Place 1 drop into the right eye 4 (four) times daily.    . prednisoLONE acetate (PRED FORTE) 1 % ophthalmic suspension     . predniSONE (DELTASONE)  20 MG tablet Take 2 tablets (40 mg total) by mouth daily with breakfast. Starting 5 days prior to 1st dose of Rituxan 60 tablet 0  . senna (SENOKOT) 8.6 MG TABS tablet Take 1 tablet by mouth daily as needed for mild constipation.     Marland Kitchen Spacer/Aero-Holding Chambers (AEROCHAMBER PLUS) inhaler Use as instructed 1 each 2  . hydrOXYzine (VISTARIL) 25 MG capsule Take by mouth.    . triamcinolone cream (KENALOG) 0.1 % Apply 1 application topically 2 (two) times daily. 45 g 0   No current facility-administered medications for this visit.    REVIEW OF SYSTEMS:   A 10+ POINT REVIEW OF SYSTEMS WAS OBTAINED including neurology, dermatology, psychiatry, cardiac, respiratory, lymph, extremities, GI, GU, Musculoskeletal, constitutional, breasts, reproductive, HEENT.  All pertinent positives are noted in the HPI.  All others are negative.   PHYSICAL EXAMINATION:  ECOG PERFORMANCE STATUS: 1 BP 130/77 (BP Location: Left Arm, Patient Position: Sitting)   Pulse 69   Temp 98.3 F (36.8 C) (Temporal)   Resp 18   Ht 5' 6" (1.676 m)   Wt 158 lb 11.2 oz (72 kg)   SpO2 100%   BMI 25.61 kg/m    GENERAL:alert, in no acute distress and comfortable SKIN: no significant lesions, minor acneiform rash in the center of chest and on right shoulder blade  EYES: conjunctiva are pink and non-injected, sclera anicteric OROPHARYNX: MMM, no exudates, no oropharyngeal erythema or ulceration NECK: supple, no JVD LYMPH:  no palpable lymphadenopathy in the cervical, axillary or  inguinal regions LUNGS: clear to auscultation b/l with normal respiratory effort HEART: regular rate & rhythm ABDOMEN:  normoactive bowel sounds , non tender, not distended. No palpable hepatosplenomegaly.  Extremity: no pedal edema PSYCH: alert & oriented x 3 with fluent speech NEURO: no focal motor/sensory deficits  LABORATORY DATA:   I have reviewed the data as listed   CBC Latest Ref Rng & Units 01/29/2020 01/22/2020 01/14/2020  WBC 4.0 - 10.5 K/uL 77.6(HH) 91.6(HH) 245.0(HH)  Hemoglobin 12.0 - 15.0 g/dL 11.2(L) 10.3(L) 9.5(L)  Hematocrit 36.0 - 46.0 % 34.5(L) 32.2(L) 30.5(L)  Platelets 150 - 400 K/uL 274 188 209  ANC 4.1k . CBC    Component Value Date/Time   WBC 77.6 (HH) 01/29/2020 0838   RBC 3.45 (L) 01/29/2020 0838   HGB 11.2 (L) 01/29/2020 0838   HGB 8.0 (L) 02/07/2018 0905   HGB 9.1 (L) 09/14/2017 0835   HCT 34.5 (L) 01/29/2020 0838   HCT 35.2 03/05/2018 1331   HCT 29.0 (L) 09/14/2017 0835   PLT 274 01/29/2020 0838   PLT 50 (L) 02/07/2018 0905   PLT 46 (L) 09/14/2017 0835   MCV 100.0 01/29/2020 0838   MCV 92.1 09/14/2017 0835   MCH 32.5 01/29/2020 0838   MCHC 32.5 01/29/2020 0838   RDW 15.9 (H) 01/29/2020 0838   RDW 18.2 (H) 09/14/2017 0835   LYMPHSABS 64.4 (H) 01/29/2020 0838   LYMPHSABS 2.0 09/14/2017 0835   MONOABS 1.6 (H) 01/29/2020 0838   MONOABS 0.3 09/14/2017 0835   EOSABS 3.1 (H) 01/29/2020 0838   EOSABS 0.0 09/14/2017 0835   BASOSABS 0.0 01/29/2020 0838   BASOSABS 0.0 09/14/2017 0835    . CMP Latest Ref Rng & Units 01/29/2020 01/22/2020 01/14/2020  Glucose 70 - 99 mg/dL 83 108(H) 93  BUN 8 - 23 mg/dL _0 Creatinine 0.44 - 1.00 mg/dL 0.79 0.74 0.76  Sodium 135 - 145 mmol/L 140 140 142  Potassium 3.5 - 5.1  mmol/L 3.8 3.6 3.9  Chloride 98 - 111 mmol/L 108 107 107  CO2 22 - 32 mmol/L _0 Calcium 8.9 - 10.3 mg/dL 9.1 8.7(L) 8.6(L)  Total Protein 6.5 - 8.1 g/dL 7.4 6.8 6.8  Total Bilirubin 0.3 - 1.2 mg/dL 1.2 1.3(H) 1.4(H)  Alkaline Phos  38 - 126 U/L 64 57 58  AST 15 - 41 U/L _1 ALT 0 - 44 U/L _2 Component     Latest Ref Rng & Units 07/13/2017  LDH     125 - 245 U/L 232  Hep C Virus Ab     0.0 - 0.9 s/co ratio <0.1  HIV Screen 4th Generation wRfx     Non Reactive Non Reactive  Hep B Core Ab, Tot     Negative Negative  Hepatitis B Surface Ag     Negative Negative   Component     Latest Ref Rng & Units 07/28/2017  LDH     125 - 245 U/L 200  Uric Acid, Serum     2.6 - 7.4 mg/dl 6.0  Phosphorus     2.5 - 4.5 mg/dL 3.8   03/05/18 Pathology Report Splenectomy:      RADIOGRAPHIC STUDIES: I have personally reviewed the radiological images as listed and agreed with the findings in the report. CT Chest W Contrast  Result Date: 12/31/2019 CLINICAL DATA:  Splenic marginal zone lymphoma. Restaging. EXAM: CT CHEST, ABDOMEN, AND PELVIS WITH CONTRAST TECHNIQUE: Multidetector CT imaging of the chest, abdomen and pelvis was performed following the standard protocol during bolus administration of intravenous contrast. CONTRAST:  122m OMNIPAQUE IOHEXOL 300 MG/ML  SOLN COMPARISON:  Abdomen/pelvis CT 12/21/2017. FINDINGS: CT CHEST FINDINGS Cardiovascular: The heart size is normal. No substantial pericardial effusion. No thoracic aortic aneurysm. Atherosclerotic calcification is noted in the wall of the thoracic aorta. Enlargement of the pulmonary outflow tract and main pulmonary arteries suggests pulmonary arterial hypertension. Mediastinum/Nodes: Scattered small mediastinal lymph nodes evident without lymphadenopathy. There is no hilar lymphadenopathy. The esophagus has normal imaging features. There is no axillary lymphadenopathy. Lungs/Pleura: No suspicious pulmonary nodule or mass. No focal airspace consolidation. No pleural effusion. Musculoskeletal: No worrisome lytic or sclerotic osseous abnormality. CT ABDOMEN PELVIS FINDINGS Hepatobiliary: No suspicious focal abnormality within the liver parenchyma. Liver measures  22.5 cm craniocaudal length, enlarged. There is no evidence for gallstones, gallbladder wall thickening, or pericholecystic fluid. No intrahepatic or extrahepatic biliary dilation. Pancreas: No focal mass lesion. No dilatation of the main duct. No intraparenchymal cyst. No peripancreatic edema. Spleen: Prior splenectomy. Adrenals/Urinary Tract: No adrenal nodule or mass. Tiny hypoattenuating lesions in the right kidney are too small to characterize but likely benign. Left kidney unremarkable. No evidence for hydroureter. The urinary bladder appears normal for the degree of distention. Stomach/Bowel: Stomach is unremarkable. No gastric wall thickening. No evidence of outlet obstruction. Duodenum is normally positioned as is the ligament of Treitz. No small bowel wall thickening. No small bowel dilatation. The terminal ileum is normal. The appendix is normal. No gross colonic mass. No colonic wall thickening. Diverticular changes are noted in the left colon without evidence of diverticulitis. Vascular/Lymphatic: There is abdominal aortic atherosclerosis without aneurysm. Upper abdominal lymphadenopathy generates mass-effect on the portal vein although the portal vein does remain patent. Tiny juxta cardiac and juxta diaphragmatic lymph nodes are evident. Bulky lymphadenopathy is identified in the upper abdomen. Index 3.0 cm short axis gastrohepatic ligament lymph node is visible on 61/2. Bulky portal  caval node measuring 3.4 cm short axis is visible on 67/2. 2.7 cm short axis central mesenteric node is visible on 74/2. Small retroperitoneal lymph nodes are seen in the upper abdomen. No pelvic sidewall lymphadenopathy. No inguinal lymphadenopathy. Reproductive: The uterus is unremarkable.  There is no adnexal mass. Other: No intraperitoneal free fluid. Musculoskeletal: No worrisome lytic or sclerotic osseous abnormality. IMPRESSION: 1. 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  Although there are scattered tiny lymph nodes in the chest, no lymphadenopathy by size criteria in the thorax or pelvis. 2. Hepatomegaly. 3. Prior splenectomy. 4. Enlargement of the pulmonary outflow tract and main pulmonary arteries suggests pulmonary arterial hypertension. 5.  Aortic Atherosclerois (ICD10-170.0) Electronically Signed   By: Misty Stanley M.D.   On: 12/31/2019 10:05   CT Abdomen Pelvis W Contrast  Result Date: 12/31/2019 CLINICAL DATA:  Splenic marginal zone lymphoma. Restaging. EXAM: CT CHEST, ABDOMEN, AND PELVIS WITH CONTRAST TECHNIQUE: Multidetector CT imaging of the chest, abdomen and pelvis was performed following the standard protocol during bolus administration of intravenous contrast. CONTRAST:  170m OMNIPAQUE IOHEXOL 300 MG/ML  SOLN COMPARISON:  Abdomen/pelvis CT 12/21/2017. FINDINGS: CT CHEST FINDINGS Cardiovascular: The heart size is normal. No substantial pericardial effusion. No thoracic aortic aneurysm. Atherosclerotic calcification is noted in the wall of the thoracic aorta. Enlargement of the pulmonary outflow tract and main pulmonary arteries suggests pulmonary arterial hypertension. Mediastinum/Nodes: Scattered small mediastinal lymph nodes evident without lymphadenopathy. There is no hilar lymphadenopathy. The esophagus has normal imaging features. There is no axillary lymphadenopathy. Lungs/Pleura: No suspicious pulmonary nodule or mass. No focal airspace consolidation. No pleural effusion. Musculoskeletal: No worrisome lytic or sclerotic osseous abnormality. CT ABDOMEN PELVIS FINDINGS Hepatobiliary: No suspicious focal abnormality within the liver parenchyma. Liver measures 22.5 cm craniocaudal length, enlarged. There is no evidence for gallstones, gallbladder wall thickening, or pericholecystic fluid. No intrahepatic or extrahepatic biliary dilation. Pancreas: No focal mass lesion. No dilatation of the main duct. No intraparenchymal cyst. No peripancreatic edema. Spleen:  Prior splenectomy. Adrenals/Urinary Tract: No adrenal nodule or mass. Tiny hypoattenuating lesions in the right kidney are too small to characterize but likely benign. Left kidney unremarkable. No evidence for hydroureter. The urinary bladder appears normal for the degree of distention. Stomach/Bowel: Stomach is unremarkable. No gastric wall thickening. No evidence of outlet obstruction. Duodenum is normally positioned as is the ligament of Treitz. No small bowel wall thickening. No small bowel dilatation. The terminal ileum is normal. The appendix is normal. No gross colonic mass. No colonic wall thickening. Diverticular changes are noted in the left colon without evidence of diverticulitis. Vascular/Lymphatic: There is abdominal aortic atherosclerosis without aneurysm. Upper abdominal lymphadenopathy generates mass-effect on the portal vein although the portal vein does remain patent. Tiny juxta cardiac and juxta diaphragmatic lymph nodes are evident. Bulky lymphadenopathy is identified in the upper abdomen. Index 3.0 cm short axis gastrohepatic ligament lymph node is visible on 61/2. Bulky portal caval node measuring 3.4 cm short axis is visible on 67/2. 2.7 cm short axis central mesenteric node is visible on 74/2. Small retroperitoneal lymph nodes are seen in the upper abdomen. No pelvic sidewall lymphadenopathy. No inguinal lymphadenopathy. Reproductive: The uterus is unremarkable.  There is no adnexal mass. Other: No intraperitoneal free fluid. Musculoskeletal: No worrisome lytic or sclerotic osseous abnormality. IMPRESSION: 1. 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 Although there are scattered tiny lymph nodes in the chest, no lymphadenopathy by size criteria in the thorax or pelvis. 2.  Hepatomegaly. 3. Prior splenectomy. 4. Enlargement of the pulmonary outflow tract and main pulmonary arteries suggests pulmonary arterial hypertension. 5.  Aortic Atherosclerois  (ICD10-170.0) Electronically Signed   By: Misty Stanley M.D.   On: 12/31/2019 10:05     ASSESSMENT & PLAN:   Desirre Eickhoff is a wonderful 69 y.o. 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  Her clinical presentation is also representative of this and that this may have been present over months to possibly years.  -With her pattern of involvement and results thus far it is likely that she has splenic marginal zone type lymphoma.  -PET scan on 07/25/2017 with results showing: Massive splenomegaly, splenic volume 5770 cubic cm, with homogeneous low grade activity throughout the spleen equal to that of the physiologic activity in the liver, and above the background blood pool activity in the mediastinum. No pathologic adenopathy identified. No bony involvement noted.   -CT A/p on 08/23/17 with results of: Marked splenomegaly without substantial interval change in splenic size/volume. Borderline lymphadenopathy in the hepatoduodenal ligament. Aortic Atherosclerosis. I discussed this with the patient in great detail.  -CT A/p on 09/15/17 shows significant splenomegaly shows minimal smaller change in size, with no evidence of bowel obstruction.  -12/31/2019 CT C/A/P ([id number]) ([id number]) revealed "1. 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 Although there are scattered tiny lymph nodes in the chest, no lymphadenopathy by size criteria in the thorax or pelvis. 2. Hepatomegaly. 3. Prior splenectomy. 4. Enlargement of the pulmonary outflow tract and main pulmonary arteries suggests  pulmonary arterial hypertension. 5.  Aortic Atherosclerois (ICD10-170.0)."   2. Pancytopenia s/p thrombocytopenia and anemia  Due to SMZL and hypersplenism Now resolved  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 Had to complete Rituxan at lower rate. Patient would not be a candidate for rapid infusion protocol and would need to keep the next infusion at the tolerated rate and not rate escalate. Plan  Completed with extensive pre-medications.  4. Oral mucositis/HSV outbreak - resolved -continue with acyclovir as needed   PLAN: -Discussed pt labwork today, 01/29/20; WBC continues to drop, anemia is improving, PLT are nml, blood chemistries are nml, Uric acid is WNL -The pt has no prohibitive toxicities from continuing C3D1 Rituxan at this time. Will cap Rituxan infusion rate at 200 mg/hr to prevent treatment reactions. (given previous h/o reactions) -Will premedicate with Zofran to help prevent N/V/D from Morphine  -Plan for 4 cycles of Rituxan. Will evaluate treatment results 4-6 weeks after C4.  -Recommend pt use lukewarm water and take shorter showers -Recommend pt walk 20-30 minutes per day. Advised pt that walking improves treatment-related fatigue.  -Will see back in 1 week with labs   FOLLOW UP: F/u for 4th dose of Rituxan in 1 week with labs and MD visit as scheduled   The total time spent in the appt was 20 minutes and more than 50% was on counseling and direct patient cares.  All of the patient's questions were answered with apparent satisfaction. The patient knows to call the clinic  with any problems, questions or concerns.   Sullivan Lone MD [place] AAHIVMS [facility] Hematology/Oncology Physician [facility]  (Office):       [phone] (Work cell):  [phone] (Fax):           [phone]   I, Yevette Edwards, am acting as a scribe for  Dr. Sullivan Lone.   .I have reviewed the above documentation for accuracy and completeness, and I agree with the above. Brunetta Genera MD

## 2020-01-30 ENCOUNTER — Telehealth: Payer: Self-pay | Admitting: *Deleted

## 2020-01-30 NOTE — Progress Notes (Signed)
Pharmacist Chemotherapy Monitoring - Follow Up Assessment    I verify that I have reviewed each item in the below checklist:  . Regimen for the patient is scheduled for the appropriate day and plan matches scheduled date. Marland Kitchen Appropriate non-routine labs are ordered dependent on drug ordered. . If applicable, additional medications reviewed and ordered per protocol based on lifetime cumulative doses and/or treatment regimen.   Plan for follow-up and/or issues identified: No . I-vent associated with next due treatment: No . MD and/or nursing notified: No  Toria Monte K 01/30/2020 10:59 AM

## 2020-01-30 NOTE — Telephone Encounter (Signed)
Patient called. Continues to have itching from rash (seen by Dr.Kale in office 5/5). Per patient itching improved following benadryl given yesterday as pre-med for infusion so she is wondering if she can take it for itching at home. She also has hydroxyzine available that she was given for poison ivy. Dr.Kale informed. Per Dr. Irene Limbo - can take hydroxyzine for itching. Contacted patient, spoke with Caren Griffins (SO) - gave directions to take hydroxyzine per Dr. Irene Limbo for itching. Asked patient to contact office to update on patient condition in next few days. Patient verbalized understanding.

## 2020-01-31 ENCOUNTER — Telehealth: Payer: Self-pay | Admitting: *Deleted

## 2020-01-31 NOTE — Telephone Encounter (Signed)
Cynthia (SO) called. Patient taking hydroxyzine 25 mg q 6 as directed for rash. Now hands and wrists are beginning to swell. Dr. Irene Limbo informed. Contacted Caren Griffins with following instructions per Dr. Irene Limbo: Stop Allopurinol. Continue Loratadine 10 mg and Famotidine 20 mg daily. May take either Hydroxyzine 25 mg or Benadryl 25 mg every 6 hours for rash. Take Prednisone 20 mg daily for 10 days. Caren Griffins read directions back to this Probation officer and verbalized understanding. Per Caren Griffins, no need for new Rx for Prednisone - patient has prednisone 20 mg tablets available at home (from previous Rx).

## 2020-02-05 ENCOUNTER — Other Ambulatory Visit: Payer: Self-pay

## 2020-02-05 ENCOUNTER — Inpatient Hospital Stay (HOSPITAL_BASED_OUTPATIENT_CLINIC_OR_DEPARTMENT_OTHER): Payer: Medicare Other | Admitting: Hematology

## 2020-02-05 ENCOUNTER — Inpatient Hospital Stay: Payer: Medicare Other

## 2020-02-05 ENCOUNTER — Telehealth: Payer: Self-pay | Admitting: Hematology

## 2020-02-05 VITALS — BP 129/86 | HR 55 | Temp 98.3°F | Resp 20 | Wt 156.0 lb

## 2020-02-05 VITALS — BP 119/55 | HR 58 | Temp 97.7°F | Resp 17

## 2020-02-05 DIAGNOSIS — Z5112 Encounter for antineoplastic immunotherapy: Secondary | ICD-10-CM

## 2020-02-05 DIAGNOSIS — C8307 Small cell B-cell lymphoma, spleen: Secondary | ICD-10-CM

## 2020-02-05 DIAGNOSIS — R21 Rash and other nonspecific skin eruption: Secondary | ICD-10-CM

## 2020-02-05 DIAGNOSIS — R634 Abnormal weight loss: Secondary | ICD-10-CM | POA: Diagnosis not present

## 2020-02-05 DIAGNOSIS — R162 Hepatomegaly with splenomegaly, not elsewhere classified: Secondary | ICD-10-CM | POA: Diagnosis not present

## 2020-02-05 DIAGNOSIS — Z7189 Other specified counseling: Secondary | ICD-10-CM

## 2020-02-05 DIAGNOSIS — R630 Anorexia: Secondary | ICD-10-CM | POA: Diagnosis not present

## 2020-02-05 LAB — CBC WITH DIFFERENTIAL/PLATELET
Abs Immature Granulocytes: 0 10*3/uL (ref 0.00–0.07)
Basophils Absolute: 0 10*3/uL (ref 0.0–0.1)
Basophils Relative: 0 %
Eosinophils Absolute: 0.8 10*3/uL — ABNORMAL HIGH (ref 0.0–0.5)
Eosinophils Relative: 1 %
HCT: 36.7 % (ref 36.0–46.0)
Hemoglobin: 11.6 g/dL — ABNORMAL LOW (ref 12.0–15.0)
Lymphocytes Relative: 92 %
Lymphs Abs: 76.2 10*3/uL — ABNORMAL HIGH (ref 0.7–4.0)
MCH: 31.6 pg (ref 26.0–34.0)
MCHC: 31.6 g/dL (ref 30.0–36.0)
MCV: 100 fL (ref 80.0–100.0)
Monocytes Absolute: 0.8 10*3/uL (ref 0.1–1.0)
Monocytes Relative: 1 %
Neutro Abs: 5 10*3/uL (ref 1.7–7.7)
Neutrophils Relative %: 6 %
Platelets: 274 10*3/uL (ref 150–400)
RBC: 3.67 MIL/uL — ABNORMAL LOW (ref 3.87–5.11)
RDW: 16.3 % — ABNORMAL HIGH (ref 11.5–15.5)
WBC: 82.8 10*3/uL (ref 4.0–10.5)
nRBC: 0 % (ref 0.0–0.2)

## 2020-02-05 LAB — CMP (CANCER CENTER ONLY)
ALT: 26 U/L (ref 0–44)
AST: 25 U/L (ref 15–41)
Albumin: 3.6 g/dL (ref 3.5–5.0)
Alkaline Phosphatase: 64 U/L (ref 38–126)
Anion gap: 12 (ref 5–15)
BUN: 14 mg/dL (ref 8–23)
CO2: 23 mmol/L (ref 22–32)
Calcium: 9.3 mg/dL (ref 8.9–10.3)
Chloride: 105 mmol/L (ref 98–111)
Creatinine: 0.78 mg/dL (ref 0.44–1.00)
GFR, Est AFR Am: >60 mL/min (ref >60)
GFR, Estimated: >60 mL/min (ref >60)
Glucose, Bld: 84 mg/dL (ref 70–99)
Potassium: 3.6 mmol/L (ref 3.5–5.1)
Sodium: 140 mmol/L (ref 135–145)
Total Bilirubin: 1.1 mg/dL (ref 0.3–1.2)
Total Protein: 7.6 g/dL (ref 6.5–8.1)

## 2020-02-05 LAB — URIC ACID: Uric Acid, Serum: 6 mg/dL (ref 2.5–7.1)

## 2020-02-05 MED ORDER — FAMOTIDINE IN NACL 20-0.9 MG/50ML-% IV SOLN
20.0000 mg | Freq: Once | INTRAVENOUS | Status: AC
  Administered 2020-02-05: 20 mg via INTRAVENOUS

## 2020-02-05 MED ORDER — ACETAMINOPHEN 500 MG PO TABS
ORAL_TABLET | ORAL | Status: AC
  Filled 2020-02-05: qty 2

## 2020-02-05 MED ORDER — MORPHINE SULFATE 4 MG/ML IJ SOLN
2.0000 mg | Freq: Once | INTRAMUSCULAR | Status: AC
  Administered 2020-02-05: 2 mg via INTRAVENOUS
  Filled 2020-02-05: qty 1

## 2020-02-05 MED ORDER — DIPHENHYDRAMINE HCL 50 MG/ML IJ SOLN
INTRAMUSCULAR | Status: AC
  Filled 2020-02-05: qty 1

## 2020-02-05 MED ORDER — METHYLPREDNISOLONE SODIUM SUCC 125 MG IJ SOLR
INTRAMUSCULAR | Status: AC
  Filled 2020-02-05: qty 2

## 2020-02-05 MED ORDER — ONDANSETRON HCL 4 MG/2ML IJ SOLN
INTRAMUSCULAR | Status: AC
  Filled 2020-02-05: qty 2

## 2020-02-05 MED ORDER — HYDROXYZINE HCL 25 MG PO TABS: 25.0000 mg | ORAL_TABLET | ORAL | 0 refills | Status: AC | PRN

## 2020-02-05 MED ORDER — MONTELUKAST SODIUM 10 MG PO TABS
10.0000 mg | ORAL_TABLET | Freq: Once | ORAL | Status: AC
  Administered 2020-02-05: 10 mg via ORAL

## 2020-02-05 MED ORDER — ONDANSETRON HCL 4 MG/2ML IJ SOLN
4.0000 mg | Freq: Once | INTRAMUSCULAR | Status: AC
  Administered 2020-02-05: 4 mg via INTRAVENOUS

## 2020-02-05 MED ORDER — SODIUM CHLORIDE 0.9 % IV SOLN
375.0000 mg/m2 | Freq: Once | INTRAVENOUS | Status: AC
  Administered 2020-02-05: 700 mg via INTRAVENOUS
  Filled 2020-02-05: qty 50

## 2020-02-05 MED ORDER — METHYLPREDNISOLONE SODIUM SUCC 125 MG IJ SOLR
125.0000 mg | Freq: Once | INTRAMUSCULAR | Status: AC
  Administered 2020-02-05: 125 mg via INTRAVENOUS

## 2020-02-05 MED ORDER — DIPHENHYDRAMINE HCL 50 MG/ML IJ SOLN
50.0000 mg | Freq: Once | INTRAMUSCULAR | Status: AC
  Administered 2020-02-05: 50 mg via INTRAVENOUS

## 2020-02-05 MED ORDER — LORAZEPAM 1 MG PO TABS
0.5000 mg | ORAL_TABLET | Freq: Once | ORAL | Status: AC
  Administered 2020-02-05: 0.5 mg via ORAL

## 2020-02-05 MED ORDER — MONTELUKAST SODIUM 10 MG PO TABS
ORAL_TABLET | ORAL | Status: AC
  Filled 2020-02-05: qty 1

## 2020-02-05 MED ORDER — LORAZEPAM 1 MG PO TABS
ORAL_TABLET | ORAL | Status: AC
  Filled 2020-02-05: qty 1

## 2020-02-05 MED ORDER — MORPHINE SULFATE (PF) 4 MG/ML IV SOLN
INTRAVENOUS | Status: AC
  Filled 2020-02-05: qty 1

## 2020-02-05 MED ORDER — FAMOTIDINE IN NACL 20-0.9 MG/50ML-% IV SOLN
INTRAVENOUS | Status: AC
  Filled 2020-02-05: qty 50

## 2020-02-05 MED ORDER — SODIUM CHLORIDE 0.9 % IV SOLN
Freq: Once | INTRAVENOUS | Status: AC
  Filled 2020-02-05: qty 250

## 2020-02-05 MED ORDER — ACETAMINOPHEN 500 MG PO TABS
1000.0000 mg | ORAL_TABLET | Freq: Once | ORAL | Status: AC
  Administered 2020-02-05: 1000 mg via ORAL

## 2020-02-05 NOTE — Patient Instructions (Signed)
[facility] Discharge Instructions for Patients Receiving Chemotherapy  Today you received the following chemotherapy agents:  Rituxan.  To help prevent nausea and vomiting after your treatment, we encourage you to take your nausea medication as directed.   If you develop nausea and vomiting that is not controlled by your nausea medication, call the clinic.   BELOW ARE SYMPTOMS THAT SHOULD BE REPORTED IMMEDIATELY:  *FEVER GREATER THAN 100.5 F  *CHILLS WITH OR WITHOUT FEVER  NAUSEA AND VOMITING THAT IS NOT CONTROLLED WITH YOUR NAUSEA MEDICATION  *UNUSUAL SHORTNESS OF BREATH  *UNUSUAL BRUISING OR BLEEDING  TENDERNESS IN MOUTH AND THROAT WITH OR WITHOUT PRESENCE OF ULCERS  *URINARY PROBLEMS  *BOWEL PROBLEMS  UNUSUAL RASH Items with * indicate a potential emergency and should be followed up as soon as possible.  Feel free to call the clinic should you have any questions or concerns. The clinic phone number is [phone].  Please show the CHEMO ALERT CARD at check-in to the Emergency Department and triage nurse.   

## 2020-02-05 NOTE — Progress Notes (Signed)
HEMATOLOGY/ONCOLOGY FOLLOW UP NOTE  Date of Service:  02/05/20     Patient Care Team: Patient, No Pcp Per as PCP - General (General Practice)  CHIEF COMPLAINTS F/u for continued management of SMZL   HISTORY OF PRESENTING ILLNESS:   Michelle Cohen is a wonderful 69 y.o. female who has been referred to [place] from [place] [facility] for evaluation and management of B-cell lymphoma. She presents to her appointment today with her partner of 21 years. She states that she moved to the area ~2 years ago secondary to her sister's diagnosis of lung cancer (unfortunately, now passed) and taking care of her mother's Alzheimer's. Generally, prior to this issue she has been very healthy and without chronic medical conditions and not on chronic medical therapies/treatments.   Initially, the patient was admitted to [place] [facility] for abdominal pain and significant hernia. She reports that she was visiting in [place] and while flying there she had an acute onset of sweats and left lower quadrant pain. She reports that her hernia was not present at that time, but as she continued to fly her hernia had become more noticeable and enlarged in her lower abdomen. Additionally, her partner reports that her episode of hydrosis was very significant, stating that it was pooling below her. On their arrival to [facility] on 06/21/17, a CT A/P was performed which showed an incarcerated hernia. Of note, her CT showed massive splenomegaly at 32cm in greatest dimension. Manual reduction of her hernia was attempted while in the ED but was unsuccessful. Pre-operative notes state that following intubation manual reduction was performed successfully and hernia was repaired laparoscopically. CT CAP was performed as well which was without the presence of additional surrounding LAD. She did have flow cytometry performed during her admission as well which showed B-cell lymphocytes which were monoclonal. A bone marrow biopsy was  also examined which showed that it was markedly hypercellular (~95%) with prominent interstitial infiltrate of small lymphocytes.   Prior to her diagnosis, she mostly felt typically well overall. She states that she did suffer from insomnia which she related to over-thinking at the time regarding her familial situation. She did experience some fatigue and weight loss with this as well. Her partner reports that she noticed her weight loss approximately 83moago, which she quantifies at approximately 30lbs since onset. Towards the end of this six month periods and just prior to the appearance of her hernia the patient began to notice some bothersome abdominal distension. She also noticed some significant fatigue and she would nap for longer periods following her daily workouts. Additionally, her partner would notice that she would have issues with breathing and this appeared irregular and more labored, especially at night while laying down. She also mentions that she did experience intermittent night sweats throughout this six month period as well. She denies lightheadedness, dizziness, abdominal pain, back pain, fever, chills, abnormal bruising/bleeding, bloody stools, epistaxis, hematuria, gingival bleeding, or other bleeding issues within this period. She has not received any blood transfusions and has not previously had a tattoo; she is unconcerned with prior HepC exposure.   Since her hernia surgery, she reports that she feels great symptomatically overall and much improved. She does have some issues with abdominal bloating and discomfort, but this has been manageable. No overt abdominal pain.   Throughout all of this, she has attempted to try to remain within good spirits. Losing her sister recently has been hard, but she has been working through it  with her family; however, her partner voices that they do not have strong social support in this area as most of their support is in  where they  previously lived. She has been working through her sister's death with grief counseling which has been helping her.    She has never previously smoked, but she reports some moderate second-hand smoke through her father at a younger age. Her father did die of a PE, but she is unaware of any other diagnoses of clotting disorders or cancers within her family. No h/o alcohol abuse, chemical/radiation exposures. She worked previously in Industrial/product designer.   PREVIOUS TREATMENT:  Rituximab weekly x4 beginning on 07/21/17-08/11/17 Received additional Rituxan weekly x 4 doses Rituximab  Weekly for 4 weeks starting 10/05/17 - completed 10/2017. S/p splenectomy at [place] with Dr Shon Hough.  CURRENT TREATMENT:  Weekly Rituxan  INTERIM HISTORY:   Michelle Cohen returns today for scheduled follow up of her SMZL. We are joined today by her wife, Caren Griffins. She is here for C4D1 of Rituxan. The patient's last visit with [place] was on 01/29/2020. The pt reports that she is doing well overall.  The pt reports that beginning the night after her last infusion the palms of her hands, fingers, and feet began itching. Her fingers also became swollen on Wednesday night, which lasted until Sunday. She had rash that started on her neck and covered most of her back. The rash was red and raised and resembled a large welt. She was using Claritin and Pepcid throughout and also took Hydroxyzine. The rash and swelling improved greatly after beginning Prednisone. Her wife also saw a noticeable improvement after pt discontinued Allopurinol.   Pt denies nausea but notes that she has not been eating at her baseline because she has not felt well enough to desire food regularly. She has lost about 10 lbs since beginning treatment.   Lab results today (02/05/20) of CBC w/diff and CMP is as follows: all values are WNL except for WBC at 82.8K, RBC at 3.67, Hgb at 11.6, RDW at 16.3, Lymphs Abs at 76.2K, Eos Abs at 0.8K, WBC  Morphology shows "Atypical Lymphocytes Present", Smudge Cells are "Present". 02/05/2020 Uric acid at 6.0  On review of systems, pt reports rash, low appetite, weight loss and denies nausea, itching, swelling of fingers and any other symptoms.   MEDICAL HISTORY:  Past Medical History:  Diagnosis Date  . B-cell lymphoma ([place]) 06/2017     SURGICAL HISTORY: Past Surgical History:  Procedure Laterality Date  . left inguinal hernia repair Left   . SPLENECTOMY, TOTAL  02/13/2018    SOCIAL HISTORY: Social History   Socioeconomic History  . Marital status: Single    Spouse name: Not on file  . Number of children: Not on file  . Years of education: Not on file  . Highest education level: Not on file  Occupational History  . Not on file  Tobacco Use  . Smoking status: Never Smoker  . Smokeless tobacco: Never Used  Substance and Sexual Activity  . Alcohol use: Yes    Comment: glass of wine occasionally  . Drug use: No  . Sexual activity: Not on file  Other Topics Concern  . Not on file  Social History Narrative  . Not on file   Social Determinants of Health   Financial Resource Strain:   . Difficulty of Paying Living Expenses:   Food Insecurity:   . Worried About Charity fundraiser in  the Last Year:   . Export in the Last Year:   Transportation Needs:   . Film/video editor (Medical):   Marland Kitchen Lack of Transportation (Non-Medical):   Physical Activity:   . Days of Exercise per Week:   . Minutes of Exercise per Session:   Stress:   . Feeling of Stress :   Social Connections:   . Frequency of Communication with Friends and Family:   . Frequency of Social Gatherings with Friends and Family:   . Attends Religious Services:   . Active Member of Clubs or Organizations:   . Attends Archivist Meetings:   Marland Kitchen Marital Status:   Intimate Partner Violence:   . Fear of Current or Ex-Partner:   . Emotionally Abused:   Marland Kitchen Physically Abused:   . Sexually  Abused:     FAMILY HISTORY: Sister - lung cancer Mother -Alzheimers dementia  ALLERGIES:  is allergic to allopurinol and tape.  MEDICATIONS:  Current Outpatient Medications  Medication Sig Dispense Refill  . acetaminophen (TYLENOL) 500 MG tablet Take 500 mg by mouth every 8 (eight) hours as needed for moderate pain.     Marland Kitchen acyclovir (ZOVIRAX) 200 MG capsule Take by mouth.    Marland Kitchen acyclovir (ZOVIRAX) 200 MG capsule TAKE 2 CAPSULES BY MOUTH TWICE DAILY 120 capsule 1  . albuterol (PROVENTIL HFA;VENTOLIN HFA) 108 (90 Base) MCG/ACT inhaler Inhale 1-2 puffs into the lungs every 6 (six) hours as needed for wheezing or shortness of breath. 1 Inhaler 0  . aspirin 81 MG EC tablet Take by mouth.    . benzonatate (TESSALON) 200 MG capsule Take 1 capsule (200 mg total) by mouth 3 (three) times daily as needed for cough. 30 capsule 0  . chlorhexidine (PERIDEX) 0.12 % solution SWISH WITH 15 MLS IN MOUTH OR THROAT THEN SPIT --USE TWICE DAILY AS DIRECTED 473 mL 1  . chlorpheniramine-HYDROcodone (TUSSIONEX PENNKINETIC ER) 10-8 MG/5ML SUER Take 5 mLs by mouth every 12 (twelve) hours as needed for cough. 60 mL 0  . Cyanocobalamin (VITAMIN B 12 PO) Take by mouth.    . Diphenhyd-Hydrocort-Nystatin (FIRST-DUKES MOUTHWASH MT) Take by mouth.    . docusate sodium (COLACE) 100 MG capsule Take 100 mg by mouth 2 (two) times daily.    Marland Kitchen docusate sodium (COLACE) 50 MG capsule Take by mouth.    . fluticasone (FLONASE) 50 MCG/ACT nasal spray Place 2 sprays into both nostrils daily. 16 g 0  . folic acid (FOLVITE) 1 MG tablet Take by mouth.    . hydrOXYzine (VISTARIL) 25 MG capsule Take by mouth.    . Loratadine 10 MG CAPS Take 10 mg by mouth daily.    . magic mouthwash w/lidocaine SOLN Take 5 mLs by mouth 4 (four) times daily as needed for mouth pain. Swish and spit 1 Part viscous lidocaine 2% 1 Part Maalox (do not substitute Kaopectate) 1 Part diphenhydramine 12.5 mg per 5 ml elixir. 120 mL 2  . magic mouthwash  w/lidocaine SOLN Take 5 mLs by mouth 4 (four) times daily as needed for mouth pain. Swish and spit 120 mL 0  . moxifloxacin (VIGAMOX) 0.5 % ophthalmic solution     . oxyCODONE (OXY IR/ROXICODONE) 5 MG immediate release tablet Take by mouth.    . Prednisolon-Gatiflox-Bromfenac 1-0.5-0.075 % SUSP Place 1 drop into the right eye 4 (four) times daily.    . prednisoLONE acetate (PRED FORTE) 1 % ophthalmic suspension     . predniSONE (DELTASONE) 20 MG  tablet Take 2 tablets (40 mg total) by mouth daily with breakfast. Starting 5 days prior to 1st dose of Rituxan 60 tablet 0  . senna (SENOKOT) 8.6 MG TABS tablet Take 1 tablet by mouth daily as needed for mild constipation.     Marland Kitchen Spacer/Aero-Holding Chambers (AEROCHAMBER PLUS) inhaler Use as instructed 1 each 2  . triamcinolone cream (KENALOG) 0.1 % Apply 1 application topically 2 (two) times daily. 45 g 0  . hydrOXYzine (ATARAX/VISTARIL) 25 MG tablet Take 1-2 tablets (25-50 mg total) by mouth every 4 (four) hours as needed. 30 tablet 0   No current facility-administered medications for this visit.    REVIEW OF SYSTEMS:   A 10+ POINT REVIEW OF SYSTEMS WAS OBTAINED including neurology, dermatology, psychiatry, cardiac, respiratory, lymph, extremities, GI, GU, Musculoskeletal, constitutional, breasts, reproductive, HEENT.  All pertinent positives are noted in the HPI.  All others are negative.   PHYSICAL EXAMINATION:  ECOG PERFORMANCE STATUS: 1 BP 129/86 (BP Location: Left Arm, Patient Position: Sitting)   Pulse (!) 55   Temp 98.3 F (36.8 C) (Temporal)   Resp 20   Wt 156 lb (70.8 kg)   SpO2 100%   BMI 25.18 kg/m    GENERAL:alert, in no acute distress and comfortable SKIN: no significant lesions, improving rash on right flank EYES: conjunctiva are pink and non-injected, sclera anicteric OROPHARYNX: MMM, no exudates, no oropharyngeal erythema or ulceration NECK: supple, no JVD LYMPH:  no palpable lymphadenopathy in the cervical, axillary or  inguinal regions LUNGS: clear to auscultation b/l with normal respiratory effort HEART: regular rate & rhythm ABDOMEN:  normoactive bowel sounds , non tender, not distended. No palpable hepatosplenomegaly.  Extremity: no pedal edema PSYCH: alert & oriented x 3 with fluent speech NEURO: no focal motor/sensory deficits  LABORATORY DATA:   I have reviewed the data as listed   CBC Latest Ref Rng & Units 02/05/2020 01/29/2020 01/22/2020  WBC 4.0 - 10.5 K/uL 82.8(HH) 77.6(HH) 91.6(HH)  Hemoglobin 12.0 - 15.0 g/dL 11.6(L) 11.2(L) 10.3(L)  Hematocrit 36.0 - 46.0 % 36.7 34.5(L) 32.2(L)  Platelets 150 - 400 K/uL 274 274 188  ANC 4.1k . CBC    Component Value Date/Time   WBC 82.8 (HH) 02/05/2020 0944   RBC 3.67 (L) 02/05/2020 0944   HGB 11.6 (L) 02/05/2020 0944   HGB 8.0 (L) 02/07/2018 0905   HGB 9.1 (L) 09/14/2017 0835   HCT 36.7 02/05/2020 0944   HCT 35.2 03/05/2018 1331   HCT 29.0 (L) 09/14/2017 0835   PLT 274 02/05/2020 0944   PLT 50 (L) 02/07/2018 0905   PLT 46 (L) 09/14/2017 0835   MCV 100.0 02/05/2020 0944   MCV 92.1 09/14/2017 0835   MCH 31.6 02/05/2020 0944   MCHC 31.6 02/05/2020 0944   RDW 16.3 (H) 02/05/2020 0944   RDW 18.2 (H) 09/14/2017 0835   LYMPHSABS 76.2 (H) 02/05/2020 0944   LYMPHSABS 2.0 09/14/2017 0835   MONOABS 0.8 02/05/2020 0944   MONOABS 0.3 09/14/2017 0835   EOSABS 0.8 (H) 02/05/2020 0944   EOSABS 0.0 09/14/2017 0835   BASOSABS 0.0 02/05/2020 0944   BASOSABS 0.0 09/14/2017 0835    . CMP Latest Ref Rng & Units 02/05/2020 01/29/2020 01/22/2020  Glucose 70 - 99 mg/dL 84 83 108(H)  BUN 8 - 23 mg/dL '14 12 12  ' Creatinine 0.44 - 1.00 mg/dL 0.78 0.79 0.74  Sodium 135 - 145 mmol/L 140 140 140  Potassium 3.5 - 5.1 mmol/L 3.6 3.8 3.6  Chloride 98 - 111  mmol/L 105 108 107  CO2 22 - 32 mmol/L '23 22 24  ' Calcium 8.9 - 10.3 mg/dL 9.3 9.1 8.7(L)  Total Protein 6.5 - 8.1 g/dL 7.6 7.4 6.8  Total Bilirubin 0.3 - 1.2 mg/dL 1.1 1.2 1.3(H)  Alkaline Phos 38 - 126 U/L 64  64 57  AST 15 - 41 U/L '25 23 17  ' ALT 0 - 44 U/L '26 17 16   ' Component     Latest Ref Rng & Units 07/13/2017  LDH     125 - 245 U/L 232  Hep C Virus Ab     0.0 - 0.9 s/co ratio <0.1  HIV Screen 4th Generation wRfx     Non Reactive Non Reactive  Hep B Core Ab, Tot     Negative Negative  Hepatitis B Surface Ag     Negative Negative   Component     Latest Ref Rng & Units 07/28/2017  LDH     125 - 245 U/L 200  Uric Acid, Serum     2.6 - 7.4 mg/dl 6.0  Phosphorus     2.5 - 4.5 mg/dL 3.8   03/05/18 Pathology Report Splenectomy:      RADIOGRAPHIC STUDIES: I have personally reviewed the radiological images as listed and agreed with the findings in the report. No results found.   ASSESSMENT & PLAN:   Mayline Dragon is a wonderful 69 y.o. 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  Her clinical presentation is also representative of this and that this may have been present over months to possibly years.  -With her pattern of involvement and results thus far it is likely that she has splenic marginal zone type lymphoma.  -PET scan on 07/25/2017 with results showing: Massive splenomegaly, splenic volume 5770 cubic cm, with homogeneous low grade activity throughout the spleen equal to that of the physiologic activity in the liver, and above the background blood pool activity in the mediastinum. No pathologic adenopathy identified. No bony involvement noted.   -CT A/p on 08/23/17 with results of: Marked splenomegaly without substantial interval change in splenic size/volume. Borderline lymphadenopathy in the  hepatoduodenal ligament. Aortic Atherosclerosis. I discussed this with the patient in great detail.  -CT A/p on 09/15/17 shows significant splenomegaly shows minimal smaller change in size, with no evidence of bowel obstruction.  -12/31/2019 CT C/A/P ([id number]) ([id number]) revealed "1. 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 Although there are scattered tiny lymph nodes in the chest, no lymphadenopathy by size criteria in the thorax or pelvis. 2. Hepatomegaly. 3. Prior splenectomy. 4. Enlargement of the pulmonary outflow tract and main pulmonary arteries suggests pulmonary arterial hypertension. 5.  Aortic Atherosclerois (ICD10-170.0)."   2. Pancytopenia s/p thrombocytopenia and anemia  Due to SMZL and hypersplenism Now resolved  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 Had to complete Rituxan at lower rate. Patient would not be a candidate for rapid infusion protocol and would need to keep the next infusion at the tolerated rate and not rate escalate. Plan  Completed with extensive  pre-medications.  4. Oral mucositis/HSV outbreak - resolved -continue with acyclovir as needed   PLAN: -Discussed pt labwork today, 02/05/20; WBC is steady (no further decline), Hgb continues to improve, PLT are nml, blood chemistries are nml, Uric acid is WNL -Pt could be having a delayed allergic reaction from treatment or could be having an allergic reaction to Allopurinol. She has already discontinued Allopurinol and rash/swelling has improved.  -The pt has no prohibitive toxicities from continuing C4D1 Rituxan at this time. Will cap Rituxan infusion rate at 200 mg/hr to prevent treatment reactions. (given previous h/o reactions) -Will premedicate with Zofran to help prevent N/V/D from Morphine  -Recommend pt take Prednisone - 2 pills per day for 2  days, then 1 pill daily for 5 days -Continue Pepcid + Claritin -Refill Hydroxyzine -Will see back in 4 weeks with labs    FOLLOW UP: RTC with Dr Irene Limbo with labs in 4 weeks   The total time spent in the appt was 30 minutes and more than 50% was on counseling and direct patient cares, ordering and co-ordination of treatment and mx of adverse effects.  All of the patient's questions were answered with apparent satisfaction. The patient knows to call the clinic with any problems, questions or concerns.   Sullivan Lone MD [place] AAHIVMS [facility] Hematology/Oncology Physician [facility]  (Office):       [phone] (Work cell):  [phone] (Fax):           [phone]   I, Yevette Edwards, am acting as a scribe for Dr. Sullivan Lone.   .I have reviewed the above documentation for accuracy and completeness, and I agree with the above. Brunetta Genera MD

## 2020-02-05 NOTE — Telephone Encounter (Signed)
Scheduled per los. Gave avs and calendar  

## 2020-02-07 ENCOUNTER — Encounter: Payer: Self-pay | Admitting: Hematology

## 2020-02-28 MED FILL — ACYCLOVIR 200 MG CAP: 200 | 30 days supply | Qty: 120 | Fill #1

## 2020-02-28 MED FILL — CHLORHEXIDINE 0.12% RINSE: 0.12 | 15 days supply | Qty: 473 | Fill #1

## 2020-03-04 ENCOUNTER — Other Ambulatory Visit: Payer: Self-pay

## 2020-03-04 ENCOUNTER — Inpatient Hospital Stay: Payer: Medicare Other | Attending: Hematology | Admitting: Hematology

## 2020-03-04 ENCOUNTER — Inpatient Hospital Stay: Payer: Medicare Other

## 2020-03-04 VITALS — BP 128/74 | HR 62 | Temp 97.5°F | Resp 18 | Ht 66.0 in | Wt 158.4 lb

## 2020-03-04 DIAGNOSIS — D61818 Other pancytopenia: Secondary | ICD-10-CM | POA: Diagnosis not present

## 2020-03-04 DIAGNOSIS — D649 Anemia, unspecified: Secondary | ICD-10-CM

## 2020-03-04 DIAGNOSIS — R252 Cramp and spasm: Secondary | ICD-10-CM | POA: Insufficient documentation

## 2020-03-04 DIAGNOSIS — R0602 Shortness of breath: Secondary | ICD-10-CM | POA: Diagnosis not present

## 2020-03-04 DIAGNOSIS — C884 Extranodal marginal zone B-cell lymphoma of mucosa-associated lymphoid tissue [MALT-lymphoma]: Secondary | ICD-10-CM | POA: Insufficient documentation

## 2020-03-04 DIAGNOSIS — C8307 Small cell B-cell lymphoma, spleen: Secondary | ICD-10-CM | POA: Diagnosis not present

## 2020-03-04 DIAGNOSIS — T451X5A Adverse effect of antineoplastic and immunosuppressive drugs, initial encounter: Secondary | ICD-10-CM | POA: Insufficient documentation

## 2020-03-04 DIAGNOSIS — D72829 Elevated white blood cell count, unspecified: Secondary | ICD-10-CM | POA: Diagnosis not present

## 2020-03-04 LAB — CMP (CANCER CENTER ONLY)
ALT: 21 U/L (ref 0–44)
AST: 22 U/L (ref 15–41)
Albumin: 3.5 g/dL (ref 3.5–5.0)
Alkaline Phosphatase: 50 U/L (ref 38–126)
Anion gap: 15 (ref 5–15)
BUN: 14 mg/dL (ref 8–23)
CO2: 22 mmol/L (ref 22–32)
Calcium: 9.3 mg/dL (ref 8.9–10.3)
Chloride: 105 mmol/L (ref 98–111)
Creatinine: 0.85 mg/dL (ref 0.44–1.00)
GFR, Est AFR Am: >60 mL/min (ref >60)
GFR, Estimated: >60 mL/min (ref >60)
Glucose, Bld: 88 mg/dL (ref 70–99)
Potassium: 4.5 mmol/L (ref 3.5–5.1)
Sodium: 142 mmol/L (ref 135–145)
Total Bilirubin: 0.9 mg/dL (ref 0.3–1.2)
Total Protein: 7.6 g/dL (ref 6.5–8.1)

## 2020-03-04 LAB — CBC WITH DIFFERENTIAL/PLATELET
Abs Immature Granulocytes: 0 10*3/uL (ref 0.00–0.07)
Basophils Absolute: 0.3 10*3/uL — ABNORMAL HIGH (ref 0.0–0.1)
Basophils Relative: 1 %
Eosinophils Absolute: 1.2 10*3/uL — ABNORMAL HIGH (ref 0.0–0.5)
Eosinophils Relative: 4 %
HCT: 38.6 % (ref 36.0–46.0)
Hemoglobin: 12.6 g/dL (ref 12.0–15.0)
Lymphocytes Relative: 84 %
Lymphs Abs: 24.8 10*3/uL — ABNORMAL HIGH (ref 0.7–4.0)
MCH: 32.5 pg (ref 26.0–34.0)
MCHC: 32.6 g/dL (ref 30.0–36.0)
MCV: 99.5 fL (ref 80.0–100.0)
Monocytes Absolute: 1.2 10*3/uL — ABNORMAL HIGH (ref 0.1–1.0)
Monocytes Relative: 4 %
Neutro Abs: 2.1 10*3/uL (ref 1.7–7.7)
Neutrophils Relative %: 7 %
Platelets: 316 10*3/uL (ref 150–400)
RBC: 3.88 MIL/uL (ref 3.87–5.11)
RDW: 15.9 % — ABNORMAL HIGH (ref 11.5–15.5)
WBC: 29.5 10*3/uL — ABNORMAL HIGH (ref 4.0–10.5)
nRBC: 0 % (ref 0.0–0.2)

## 2020-03-04 LAB — URIC ACID: Uric Acid, Serum: 6.7 mg/dL (ref 2.5–7.1)

## 2020-03-04 NOTE — Progress Notes (Signed)
HEMATOLOGY/ONCOLOGY FOLLOW UP NOTE  Date of Service:  03/04/20     Patient Care Team: Patient, No Pcp Per as PCP - General (General Practice)  CHIEF COMPLAINTS F/u for continued management of SMZL   HISTORY OF PRESENTING ILLNESS:   Michelle Cohen is a wonderful 69 y.o. female who has been referred to [place] from [facility] for evaluation and management of B-cell lymphoma. She presents to her appointment today with her partner of 21 years. She states that she moved to the area ~2 years ago secondary to her sister's diagnosis of lung cancer (unfortunately, now passed) and taking care of her mother's Alzheimer's. Generally, prior to this issue she has been very healthy and without chronic medical conditions and not on chronic medical therapies/treatments.   Initially, the patient was admitted to [facility] for abdominal pain and significant hernia. She reports that she was visiting in [place] and while flying there she had an acute onset of sweats and left lower quadrant pain. She reports that her hernia was not present at that time, but as she continued to fly her hernia had become more noticeable and enlarged in her lower abdomen. Additionally, her partner reports that her episode of hydrosis was very significant, stating that it was pooling below her. On their arrival to [facility] on 06/21/17, a CT A/P was performed which showed an incarcerated hernia. Of note, her CT showed massive splenomegaly at 32cm in greatest dimension. Manual reduction of her hernia was attempted while in the ED but was unsuccessful. Pre-operative notes state that following intubation manual reduction was performed successfully and hernia was repaired laparoscopically. CT CAP was performed as well which was without the presence of additional surrounding LAD. She did have flow cytometry performed during her admission as well which showed B-cell lymphocytes which were monoclonal. A bone marrow biopsy was  also examined which showed that it was markedly hypercellular (~95%) with prominent interstitial infiltrate of small lymphocytes.   Prior to her diagnosis, she mostly felt typically well overall. She states that she did suffer from insomnia which she related to over-thinking at the time regarding her familial situation. She did experience some fatigue and weight loss with this as well. Her partner reports that she noticed her weight loss approximately 62moago, which she quantifies at approximately 30lbs since onset. Towards the end of this six month periods and just prior to the appearance of her hernia the patient began to notice some bothersome abdominal distension. She also noticed some significant fatigue and she would nap for longer periods following her daily workouts. Additionally, her partner would notice that she would have issues with breathing and this appeared irregular and more labored, especially at night while laying down. She also mentions that she did experience intermittent night sweats throughout this six month period as well. She denies lightheadedness, dizziness, abdominal pain, back pain, fever, chills, abnormal bruising/bleeding, bloody stools, epistaxis, hematuria, gingival bleeding, or other bleeding issues within this period. She has not received any blood transfusions and has not previously had a tattoo; she is unconcerned with prior HepC exposure.   Since her hernia surgery, she reports that she feels great symptomatically overall and much improved. She does have some issues with abdominal bloating and discomfort, but this has been manageable. No overt abdominal pain.   Throughout all of this, she has attempted to try to remain within good spirits. Losing her sister recently has been hard, but she has been working through it  with her family; however, her partner voices that they do not have strong social support in this area as most of their support is in [place] where they  previously lived. She has been working through her sister's death with grief counseling which has been helping her.    She has never previously smoked, but she reports some moderate second-hand smoke through her father at a younger age. Her father did die of a PE, but she is unaware of any other diagnoses of clotting disorders or cancers within her family. No h/o alcohol abuse, chemical/radiation exposures. She worked previously in Industrial/product designer.   PREVIOUS TREATMENT:  Rituximab weekly x4 beginning on 07/21/17-08/11/17 Received additional Rituxan weekly x 4 doses Rituximab  Weekly for 4 weeks starting 10/05/17 - completed 10/2017. S/p splenectomy at [facility] with Dr Shon Hough.  CURRENT TREATMENT:  Weekly Rituxan  INTERIM HISTORY:   Michelle Cohen returns today for scheduled follow up of her SMZL. We are joined today by her wife, Caren Griffins. The patient's last visit with [place] was on 02/05/2020. The pt reports that she is doing well overall.  The pt reports that she has felt better over the last four weeks and has been able to walk more. She is still experiencing itchy scalp, especially in the back and the crown area of her head. She is having occasional night sweats, but they are not as drenching as they were during treatment.   Lab results today (03/04/20) of CBC w/diff and CMP is as follows: all values are WNL except for WBC at 29.5K, RDW at 15.9, Lymphs Abs at 24.8, Mono Abs at 1.2K, Eos Abs at 1.2K, Baso Abs at 0.3K.  03/04/2020 Uric acid at 6.7  On review of systems, pt reports itchy scalp, night sweats and denies hand swelling, rashes, fevers, chills, night sweats and any other symptoms.    MEDICAL HISTORY:  Past Medical History:  Diagnosis Date  . B-cell lymphoma ([place]) 06/2017     SURGICAL HISTORY: Past Surgical History:  Procedure Laterality Date  . left inguinal hernia repair Left   . SPLENECTOMY, TOTAL  02/13/2018    SOCIAL HISTORY: Social History    Socioeconomic History  . Marital status: Single    Spouse name: Not on file  . Number of children: Not on file  . Years of education: Not on file  . Highest education level: Not on file  Occupational History  . Not on file  Tobacco Use  . Smoking status: Never Smoker  . Smokeless tobacco: Never Used  Substance and Sexual Activity  . Alcohol use: Yes    Comment: glass of wine occasionally  . Drug use: No  . Sexual activity: Not on file  Other Topics Concern  . Not on file  Social History Narrative  . Not on file   Social Determinants of Health   Financial Resource Strain:   . Difficulty of Paying Living Expenses:   Food Insecurity:   . Worried About Charity fundraiser in the Last Year:   . Arboriculturist in the Last Year:   Transportation Needs:   . Film/video editor (Medical):   Marland Kitchen Lack of Transportation (Non-Medical):   Physical Activity:   . Days of Exercise per Week:   . Minutes of Exercise per Session:   Stress:   . Feeling of Stress :   Social Connections:   . Frequency of Communication with Friends and Family:   . Frequency of Social Gatherings  with Friends and Family:   . Attends Religious Services:   . Active Member of Clubs or Organizations:   . Attends Archivist Meetings:   Marland Kitchen Marital Status:   Intimate Partner Violence:   . Fear of Current or Ex-Partner:   . Emotionally Abused:   Marland Kitchen Physically Abused:   . Sexually Abused:     FAMILY HISTORY: Sister - lung cancer Mother -Alzheimers dementia  ALLERGIES:  is allergic to allopurinol and tape.  MEDICATIONS:  Current Outpatient Medications  Medication Sig Dispense Refill  . acetaminophen (TYLENOL) 500 MG tablet Take 500 mg by mouth every 8 (eight) hours as needed for moderate pain.     Marland Kitchen acyclovir (ZOVIRAX) 200 MG capsule Take by mouth.    Marland Kitchen acyclovir (ZOVIRAX) 200 MG capsule TAKE 2 CAPSULES BY MOUTH TWICE DAILY 120 capsule 1  . albuterol (PROVENTIL HFA;VENTOLIN HFA) 108 (90 Base)  MCG/ACT inhaler Inhale 1-2 puffs into the lungs every 6 (six) hours as needed for wheezing or shortness of breath. 1 Inhaler 0  . aspirin 81 MG EC tablet Take by mouth.    . benzonatate (TESSALON) 200 MG capsule Take 1 capsule (200 mg total) by mouth 3 (three) times daily as needed for cough. 30 capsule 0  . chlorhexidine (PERIDEX) 0.12 % solution SWISH WITH 15 MLS IN MOUTH OR THROAT THEN SPIT --USE TWICE DAILY AS DIRECTED 473 mL 1  . chlorpheniramine-HYDROcodone (TUSSIONEX PENNKINETIC ER) 10-8 MG/5ML SUER Take 5 mLs by mouth every 12 (twelve) hours as needed for cough. 60 mL 0  . Cyanocobalamin (VITAMIN B 12 PO) Take by mouth.    . Diphenhyd-Hydrocort-Nystatin (FIRST-DUKES MOUTHWASH MT) Take by mouth.    . docusate sodium (COLACE) 100 MG capsule Take 100 mg by mouth 2 (two) times daily.    Marland Kitchen docusate sodium (COLACE) 50 MG capsule Take by mouth.    . fluticasone (FLONASE) 50 MCG/ACT nasal spray Place 2 sprays into both nostrils daily. 16 g 0  . folic acid (FOLVITE) 1 MG tablet Take by mouth.    . hydrOXYzine (ATARAX/VISTARIL) 25 MG tablet Take 1-2 tablets (25-50 mg total) by mouth every 4 (four) hours as needed. 30 tablet 0  . hydrOXYzine (VISTARIL) 25 MG capsule Take by mouth.    . Loratadine 10 MG CAPS Take 10 mg by mouth daily.    . magic mouthwash w/lidocaine SOLN Take 5 mLs by mouth 4 (four) times daily as needed for mouth pain. Swish and spit 1 Part viscous lidocaine 2% 1 Part Maalox (do not substitute Kaopectate) 1 Part diphenhydramine 12.5 mg per 5 ml elixir. 120 mL 2  . magic mouthwash w/lidocaine SOLN Take 5 mLs by mouth 4 (four) times daily as needed for mouth pain. Swish and spit 120 mL 0  . moxifloxacin (VIGAMOX) 0.5 % ophthalmic solution     . oxyCODONE (OXY IR/ROXICODONE) 5 MG immediate release tablet Take by mouth.    . Prednisolon-Gatiflox-Bromfenac 1-0.5-0.075 % SUSP Place 1 drop into the right eye 4 (four) times daily.    . prednisoLONE acetate (PRED FORTE) 1 % ophthalmic  suspension     . predniSONE (DELTASONE) 20 MG tablet Take 2 tablets (40 mg total) by mouth daily with breakfast. Starting 5 days prior to 1st dose of Rituxan 60 tablet 0  . senna (SENOKOT) 8.6 MG TABS tablet Take 1 tablet by mouth daily as needed for mild constipation.     Marland Kitchen Spacer/Aero-Holding Chambers (AEROCHAMBER PLUS) inhaler Use as instructed 1 each 2  .  triamcinolone cream (KENALOG) 0.1 % Apply 1 application topically 2 (two) times daily. 45 g 0   No current facility-administered medications for this visit.    REVIEW OF SYSTEMS:   A 10+ POINT REVIEW OF SYSTEMS WAS OBTAINED including neurology, dermatology, psychiatry, cardiac, respiratory, lymph, extremities, GI, GU, Musculoskeletal, constitutional, breasts, reproductive, HEENT.  All pertinent positives are noted in the HPI.  All others are negative.   PHYSICAL EXAMINATION:  ECOG PERFORMANCE STATUS: 1 BP 128/74 (BP Location: Left Arm, Patient Position: Sitting)   Pulse 62   Temp (!) 97.5 F (36.4 C) (Temporal)   Resp 18   Ht '5\' 6"'  (1.676 m)   Wt 158 lb 6.4 oz (71.8 kg)   SpO2 100%   BMI 25.57 kg/m    GENERAL:alert, in no acute distress and comfortable SKIN: no acute rashes, no significant lesions EYES: conjunctiva are pink and non-injected, sclera anicteric OROPHARYNX: MMM, no exudates, no oropharyngeal erythema or ulceration NECK: supple, no JVD LYMPH:  no palpable lymphadenopathy in the cervical, axillary or inguinal regions LUNGS: clear to auscultation b/l with normal respiratory effort HEART: regular rate & rhythm ABDOMEN:  normoactive bowel sounds , non tender, not distended. No palpable hepatosplenomegaly.  Extremity: no pedal edema PSYCH: alert & oriented x 3 with fluent speech NEURO: no focal motor/sensory deficits  LABORATORY DATA:   I have reviewed the data as listed   CBC Latest Ref Rng & Units 03/04/2020 02/05/2020 01/29/2020  WBC 4.0 - 10.5 K/uL 29.5(H) 82.8(HH) 77.6(HH)  Hemoglobin 12.0 - 15.0 g/dL 12.6  11.6(L) 11.2(L)  Hematocrit 36.0 - 46.0 % 38.6 36.7 34.5(L)  Platelets 150 - 400 K/uL 316 274 274  ANC 4.1k . CBC    Component Value Date/Time   WBC 29.5 (H) 03/04/2020 0841   RBC 3.88 03/04/2020 0841   HGB 12.6 03/04/2020 0841   HGB 8.0 (L) 02/07/2018 0905   HGB 9.1 (L) 09/14/2017 0835   HCT 38.6 03/04/2020 0841   HCT 35.2 03/05/2018 1331   HCT 29.0 (L) 09/14/2017 0835   PLT 316 03/04/2020 0841   PLT 50 (L) 02/07/2018 0905   PLT 46 (L) 09/14/2017 0835   MCV 99.5 03/04/2020 0841   MCV 92.1 09/14/2017 0835   MCH 32.5 03/04/2020 0841   MCHC 32.6 03/04/2020 0841   RDW 15.9 (H) 03/04/2020 0841   RDW 18.2 (H) 09/14/2017 0835   LYMPHSABS 24.8 (H) 03/04/2020 0841   LYMPHSABS 2.0 09/14/2017 0835   MONOABS 1.2 (H) 03/04/2020 0841   MONOABS 0.3 09/14/2017 0835   EOSABS 1.2 (H) 03/04/2020 0841   EOSABS 0.0 09/14/2017 0835   BASOSABS 0.3 (H) 03/04/2020 0841   BASOSABS 0.0 09/14/2017 0835    . CMP Latest Ref Rng & Units 03/04/2020 02/05/2020 01/29/2020  Glucose 70 - 99 mg/dL 88 84 83  BUN 8 - 23 mg/dL '14 14 12  ' Creatinine 0.44 - 1.00 mg/dL 0.85 0.78 0.79  Sodium 135 - 145 mmol/L 142 140 140  Potassium 3.5 - 5.1 mmol/L 4.5 3.6 3.8  Chloride 98 - 111 mmol/L 105 105 108  CO2 22 - 32 mmol/L '22 23 22  ' Calcium 8.9 - 10.3 mg/dL 9.3 9.3 9.1  Total Protein 6.5 - 8.1 g/dL 7.6 7.6 7.4  Total Bilirubin 0.3 - 1.2 mg/dL 0.9 1.1 1.2  Alkaline Phos 38 - 126 U/L 50 64 64  AST 15 - 41 U/L '22 25 23  ' ALT 0 - 44 U/L '21 26 17   ' Component     Latest  Ref Rng & Units 07/13/2017  LDH     125 - 245 U/L 232  Hep C Virus Ab     0.0 - 0.9 s/co ratio <0.1  HIV Screen 4th Generation wRfx     Non Reactive Non Reactive  Hep B Core Ab, Tot     Negative Negative  Hepatitis B Surface Ag     Negative Negative   Component     Latest Ref Rng & Units 07/28/2017  LDH     125 - 245 U/L 200  Uric Acid, Serum     2.6 - 7.4 mg/dl 6.0  Phosphorus     2.5 - 4.5 mg/dL 3.8   03/05/18 Pathology Report  Splenectomy:      RADIOGRAPHIC STUDIES: I have personally reviewed the radiological images as listed and agreed with the findings in the report. No results found.   ASSESSMENT & PLAN:   Shaleena Crusoe is a wonderful 69 y.o. 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  Her clinical presentation is also representative of this and that this may have been present over months to possibly years.  -With her pattern of involvement and results thus far it is likely that she has splenic marginal zone type lymphoma.  -PET scan on 07/25/2017 with results showing: Massive splenomegaly, splenic volume 5770 cubic cm, with homogeneous low grade activity throughout the spleen equal to that of the physiologic activity in the liver, and above the background blood pool activity in the mediastinum. No pathologic adenopathy identified. No bony involvement noted.   -CT A/p on 08/23/17 with results of: Marked splenomegaly without substantial interval change in splenic size/volume. Borderline lymphadenopathy in the hepatoduodenal ligament. Aortic Atherosclerosis. I discussed this with the patient in great detail.  -CT A/p on 09/15/17 shows significant splenomegaly shows minimal smaller change in size, with no evidence of bowel obstruction.  -12/31/2019 CT C/A/P ([id number]) ([id number]) revealed "1. 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 Although there are scattered tiny lymph nodes in the chest, no lymphadenopathy by size criteria in the thorax or pelvis. 2. Hepatomegaly. 3. Prior splenectomy. 4.  Enlargement of the pulmonary outflow tract and main pulmonary arteries suggests pulmonary arterial hypertension. 5.  Aortic Atherosclerois (ICD10-170.0)."   2. Pancytopenia s/p thrombocytopenia and anemia  Due to SMZL and hypersplenism Now resolved  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 Had to complete Rituxan at lower rate. Patient would not be a candidate for rapid infusion protocol and would need to keep the next infusion at the tolerated rate and not rate escalate. Plan  Completed with extensive pre-medications.  4. Oral mucositis/HSV outbreak - resolved -continue with acyclovir as needed   PLAN: -Discussed pt labwork today, 03/04/20; PLT are nml, Hgb has normalized, WBC down, blood chemistries are nml, Uric acid is WNL -Discussed watching with labs vs maintenance Rituxan every 2 months -Recommendation to continue maintenance Rituxan due to partial response and to increase time to progression - pt agrees  -Advised pt that we would continue maintenance Rituxan for up to 3  years. Would also discontinue if WBC normalize.  -Will see back in 2 months with first maintenance treatment and labs    FOLLOW UP: Plz schedule to start maintenance Rituxan with labs and MD visit in 2 months    The total time spent in the appt was 20 minutes and more than 50% was on counseling and direct patient cares.  All of the patient's questions were answered with apparent satisfaction. The patient knows to call the clinic with any problems, questions or concerns.   Sullivan Lone MD [place] [facility] Hematology/Oncology Physician [facility]  (Office):       [phone] (Work cell):  [phone] (Fax):           [phone]   I, Yevette Edwards, am acting as a scribe for Dr. Sullivan Lone.   .I have reviewed the above documentation for accuracy and  completeness, and I agree with the above. Brunetta Genera MD

## 2020-03-05 ENCOUNTER — Encounter: Payer: Self-pay | Admitting: Hematology

## 2020-03-06 ENCOUNTER — Telehealth: Payer: Self-pay | Admitting: Hematology

## 2020-03-06 NOTE — Telephone Encounter (Signed)
Scheduled per 06/09 los, patient has been called and voicemail was left.

## 2020-04-04 ENCOUNTER — Other Ambulatory Visit: Payer: Self-pay | Admitting: Hematology

## 2020-04-06 ENCOUNTER — Other Ambulatory Visit: Payer: Self-pay | Admitting: *Deleted

## 2020-04-06 MED ORDER — ACYCLOVIR 200 MG PO CAPS: 400.0000 mg | ORAL_CAPSULE | Freq: Two times a day (BID) | ORAL | 1 refills | Status: DC

## 2020-04-06 MED FILL — ACYCLOVIR 200 MG CAP: 200 | 30 days supply | Qty: 120 | Fill #0

## 2020-04-27 DIAGNOSIS — H35412 Lattice degeneration of retina, left eye: Secondary | ICD-10-CM | POA: Diagnosis not present

## 2020-04-27 DIAGNOSIS — H353132 Nonexudative age-related macular degeneration, bilateral, intermediate dry stage: Secondary | ICD-10-CM | POA: Diagnosis not present

## 2020-04-27 DIAGNOSIS — H0100A Unspecified blepharitis right eye, upper and lower eyelids: Secondary | ICD-10-CM | POA: Diagnosis not present

## 2020-04-27 DIAGNOSIS — H43813 Vitreous degeneration, bilateral: Secondary | ICD-10-CM | POA: Diagnosis not present

## 2020-05-06 ENCOUNTER — Inpatient Hospital Stay (HOSPITAL_BASED_OUTPATIENT_CLINIC_OR_DEPARTMENT_OTHER): Payer: Medicare Other | Admitting: Hematology

## 2020-05-06 ENCOUNTER — Other Ambulatory Visit: Payer: Self-pay

## 2020-05-06 ENCOUNTER — Inpatient Hospital Stay: Payer: Medicare Other

## 2020-05-06 ENCOUNTER — Inpatient Hospital Stay: Payer: Medicare Other | Attending: Hematology

## 2020-05-06 VITALS — BP 112/56 | HR 62 | Temp 98.4°F | Resp 18

## 2020-05-06 VITALS — BP 131/71 | HR 60 | Temp 98.5°F | Resp 18 | Wt 162.0 lb

## 2020-05-06 DIAGNOSIS — C884 Extranodal marginal zone B-cell lymphoma of mucosa-associated lymphoid tissue [MALT-lymphoma]: Secondary | ICD-10-CM | POA: Insufficient documentation

## 2020-05-06 DIAGNOSIS — Z7982 Long term (current) use of aspirin: Secondary | ICD-10-CM | POA: Diagnosis not present

## 2020-05-06 DIAGNOSIS — R21 Rash and other nonspecific skin eruption: Secondary | ICD-10-CM | POA: Diagnosis not present

## 2020-05-06 DIAGNOSIS — R221 Localized swelling, mass and lump, neck: Secondary | ICD-10-CM | POA: Insufficient documentation

## 2020-05-06 DIAGNOSIS — D61818 Other pancytopenia: Secondary | ICD-10-CM | POA: Diagnosis not present

## 2020-05-06 DIAGNOSIS — T451X5A Adverse effect of antineoplastic and immunosuppressive drugs, initial encounter: Secondary | ICD-10-CM | POA: Insufficient documentation

## 2020-05-06 DIAGNOSIS — C8307 Small cell B-cell lymphoma, spleen: Secondary | ICD-10-CM

## 2020-05-06 DIAGNOSIS — Z5112 Encounter for antineoplastic immunotherapy: Secondary | ICD-10-CM

## 2020-05-06 DIAGNOSIS — Z79899 Other long term (current) drug therapy: Secondary | ICD-10-CM | POA: Insufficient documentation

## 2020-05-06 DIAGNOSIS — Z7189 Other specified counseling: Secondary | ICD-10-CM

## 2020-05-06 DIAGNOSIS — D731 Hypersplenism: Secondary | ICD-10-CM | POA: Insufficient documentation

## 2020-05-06 LAB — CBC WITH DIFFERENTIAL (CANCER CENTER ONLY)
Abs Immature Granulocytes: 0 10*3/uL (ref 0.00–0.07)
Basophils Absolute: 0 10*3/uL (ref 0.0–0.1)
Basophils Relative: 0 %
Eosinophils Absolute: 0.8 10*3/uL — ABNORMAL HIGH (ref 0.0–0.5)
Eosinophils Relative: 1 %
HCT: 35.1 % — ABNORMAL LOW (ref 36.0–46.0)
Hemoglobin: 11.5 g/dL — ABNORMAL LOW (ref 12.0–15.0)
Lymphocytes Relative: 88 %
Lymphs Abs: 68.6 10*3/uL — ABNORMAL HIGH (ref 0.7–4.0)
MCH: 31.3 pg (ref 26.0–34.0)
MCHC: 32.8 g/dL (ref 30.0–36.0)
MCV: 95.6 fL (ref 80.0–100.0)
Monocytes Absolute: 3.9 10*3/uL — ABNORMAL HIGH (ref 0.1–1.0)
Monocytes Relative: 5 %
Neutro Abs: 4.7 10*3/uL (ref 1.7–7.7)
Neutrophils Relative %: 6 %
Platelet Count: 263 10*3/uL (ref 150–400)
RBC: 3.67 MIL/uL — ABNORMAL LOW (ref 3.87–5.11)
RDW: 15.2 % (ref 11.5–15.5)
WBC Count: 78 10*3/uL (ref 4.0–10.5)
nRBC: 0 % (ref 0.0–0.2)

## 2020-05-06 LAB — URIC ACID: Uric Acid, Serum: 5.8 mg/dL (ref 2.5–7.1)

## 2020-05-06 LAB — CMP (CANCER CENTER ONLY)
ALT: 13 U/L (ref 0–44)
AST: 22 U/L (ref 15–41)
Albumin: 3.6 g/dL (ref 3.5–5.0)
Alkaline Phosphatase: 63 U/L (ref 38–126)
Anion gap: 12 (ref 5–15)
BUN: 13 mg/dL (ref 8–23)
CO2: 21 mmol/L — ABNORMAL LOW (ref 22–32)
Calcium: 9.5 mg/dL (ref 8.9–10.3)
Chloride: 108 mmol/L (ref 98–111)
Creatinine: 0.74 mg/dL (ref 0.44–1.00)
GFR, Est AFR Am: >60 mL/min (ref >60)
GFR, Estimated: >60 mL/min (ref >60)
Glucose, Bld: 94 mg/dL (ref 70–99)
Potassium: 4 mmol/L (ref 3.5–5.1)
Sodium: 141 mmol/L (ref 135–145)
Total Bilirubin: 1 mg/dL (ref 0.3–1.2)
Total Protein: 7.3 g/dL (ref 6.5–8.1)

## 2020-05-06 MED ORDER — ONDANSETRON HCL 4 MG/2ML IJ SOLN
4.0000 mg | Freq: Once | INTRAMUSCULAR | Status: AC
  Administered 2020-05-06: 4 mg via INTRAVENOUS

## 2020-05-06 MED ORDER — METHYLPREDNISOLONE SODIUM SUCC 125 MG IJ SOLR
125.0000 mg | Freq: Once | INTRAMUSCULAR | Status: AC
  Administered 2020-05-06: 125 mg via INTRAVENOUS

## 2020-05-06 MED ORDER — MONTELUKAST SODIUM 10 MG PO TABS
10.0000 mg | ORAL_TABLET | Freq: Once | ORAL | Status: AC
  Administered 2020-05-06: 10 mg via ORAL

## 2020-05-06 MED ORDER — MONTELUKAST SODIUM 10 MG PO TABS
ORAL_TABLET | ORAL | Status: AC
  Filled 2020-05-06: qty 1

## 2020-05-06 MED ORDER — MORPHINE SULFATE 4 MG/ML IJ SOLN
2.0000 mg | Freq: Once | INTRAMUSCULAR | Status: DC
  Filled 2020-05-06: qty 1

## 2020-05-06 MED ORDER — MORPHINE SULFATE (PF) 2 MG/ML IV SOLN
INTRAVENOUS | Status: AC
  Filled 2020-05-06: qty 1

## 2020-05-06 MED ORDER — FAMOTIDINE IN NACL 20-0.9 MG/50ML-% IV SOLN
INTRAVENOUS | Status: AC
  Filled 2020-05-06: qty 50

## 2020-05-06 MED ORDER — ACETAMINOPHEN 500 MG PO TABS
1000.0000 mg | ORAL_TABLET | Freq: Once | ORAL | Status: AC
  Administered 2020-05-06: 1000 mg via ORAL

## 2020-05-06 MED ORDER — FAMOTIDINE IN NACL 20-0.9 MG/50ML-% IV SOLN
20.0000 mg | Freq: Once | INTRAVENOUS | Status: AC
  Administered 2020-05-06: 20 mg via INTRAVENOUS

## 2020-05-06 MED ORDER — LORAZEPAM 1 MG PO TABS
0.5000 mg | ORAL_TABLET | Freq: Once | ORAL | Status: AC
  Administered 2020-05-06: 0.5 mg via ORAL

## 2020-05-06 MED ORDER — SODIUM CHLORIDE 0.9 % IV SOLN
375.0000 mg/m2 | Freq: Once | INTRAVENOUS | Status: AC
  Administered 2020-05-06: 700 mg via INTRAVENOUS
  Filled 2020-05-06: qty 50

## 2020-05-06 MED ORDER — ONDANSETRON HCL 4 MG/2ML IJ SOLN
INTRAMUSCULAR | Status: AC
  Filled 2020-05-06: qty 4

## 2020-05-06 MED ORDER — LORAZEPAM 1 MG PO TABS
ORAL_TABLET | ORAL | Status: AC
  Filled 2020-05-06: qty 1

## 2020-05-06 MED ORDER — ONDANSETRON HCL 4 MG/2ML IJ SOLN
8.0000 mg | Freq: Once | INTRAMUSCULAR | Status: AC
  Administered 2020-05-06: 8 mg via INTRAVENOUS

## 2020-05-06 MED ORDER — MORPHINE SULFATE 2 MG/ML IJ SOLN
2.0000 mg | Freq: Once | INTRAMUSCULAR | Status: AC
  Administered 2020-05-06: 2 mg via INTRAVENOUS
  Filled 2020-05-06: qty 1

## 2020-05-06 MED ORDER — KETOCONAZOLE 2 % EX SHAM: 1.0000 "application " | MEDICATED_SHAMPOO | CUTANEOUS | 1 refills | Status: AC

## 2020-05-06 MED ORDER — ONDANSETRON HCL 4 MG/2ML IJ SOLN
INTRAMUSCULAR | Status: AC
  Filled 2020-05-06: qty 2

## 2020-05-06 MED ORDER — DIPHENHYDRAMINE HCL 50 MG/ML IJ SOLN
50.0000 mg | Freq: Once | INTRAMUSCULAR | Status: AC
  Administered 2020-05-06: 50 mg via INTRAVENOUS

## 2020-05-06 MED ORDER — DIPHENHYDRAMINE HCL 50 MG/ML IJ SOLN
INTRAMUSCULAR | Status: AC
  Filled 2020-05-06: qty 1

## 2020-05-06 MED ORDER — METHYLPREDNISOLONE SODIUM SUCC 125 MG IJ SOLR
INTRAMUSCULAR | Status: AC
  Filled 2020-05-06: qty 2

## 2020-05-06 MED ORDER — SODIUM CHLORIDE 0.9 % IV SOLN
Freq: Once | INTRAVENOUS | Status: AC
  Filled 2020-05-06: qty 250

## 2020-05-06 MED ORDER — ACETAMINOPHEN 500 MG PO TABS
ORAL_TABLET | ORAL | Status: AC
  Filled 2020-05-06: qty 2

## 2020-05-06 MED FILL — KETOCONAZOLE 2% SHAMPOO: 2 | 30 days supply | Qty: 120 | Fill #0

## 2020-05-06 NOTE — Progress Notes (Signed)
CRITICAL VALUE STICKER  CRITICAL VALUE: WBC 78.0  RECEIVER (on-site recipient of call): Makhya Arave-O, RN  DATE & TIME NOTIFIED: 05/06/2020 @ 1009  MESSENGER (representative from lab): Rolland Porter  MD NOTIFIED: Sullivan Lone   TIME OF NOTIFICATION: 1011  RESPONSE: MD to address during visit with patient.

## 2020-05-06 NOTE — Progress Notes (Signed)
Wasted 0.5mg  of Ativan tablet. Leanne Chang RN witnessed waste.

## 2020-05-06 NOTE — Progress Notes (Signed)
HEMATOLOGY/ONCOLOGY FOLLOW UP NOTE  Date of Service:  05/06/20     Patient Care Team: Patient, No Pcp Per as PCP - General (General Practice)  CHIEF COMPLAINTS F/u for continued management of SMZL   HISTORY OF PRESENTING ILLNESS:   Michelle Cohen is a wonderful 69 y.o. female who has been referred to [place] from [facility] for evaluation and management of B-cell lymphoma. She presents to her appointment today with her partner of 21 years. She states that she moved to the area ~2 years ago secondary to her sister's diagnosis of lung cancer (unfortunately, now passed) and taking care of her mother's Alzheimer's. Generally, prior to this issue she has been very healthy and without chronic medical conditions and not on chronic medical therapies/treatments.   Initially, the patient was admitted to [facility] for abdominal pain and significant hernia. She reports that she was visiting in [place] and while flying there she had an acute onset of sweats and left lower quadrant pain. She reports that her hernia was not present at that time, but as she continued to fly her hernia had become more noticeable and enlarged in her lower abdomen. Additionally, her partner reports that her episode of hydrosis was very significant, stating that it was pooling below her. On their arrival to [facility] on 06/21/17, a CT A/P was performed which showed an incarcerated hernia. Of note, her CT showed massive splenomegaly at 32cm in greatest dimension. Manual reduction of her hernia was attempted while in the ED but was unsuccessful. Pre-operative notes state that following intubation manual reduction was performed successfully and hernia was repaired laparoscopically. CT CAP was performed as well which was without the presence of additional surrounding LAD. She did have flow cytometry performed during her admission as well which showed B-cell lymphocytes which were monoclonal. A bone marrow biopsy was  also examined which showed that it was markedly hypercellular (~95%) with prominent interstitial infiltrate of small lymphocytes.   Prior to her diagnosis, she mostly felt typically well overall. She states that she did suffer from insomnia which she related to over-thinking at the time regarding her familial situation. She did experience some fatigue and weight loss with this as well. Her partner reports that she noticed her weight loss approximately 22moago, which she quantifies at approximately 30lbs since onset. Towards the end of this six month periods and just prior to the appearance of her hernia the patient began to notice some bothersome abdominal distension. She also noticed some significant fatigue and she would nap for longer periods following her daily workouts. Additionally, her partner would notice that she would have issues with breathing and this appeared irregular and more labored, especially at night while laying down. She also mentions that she did experience intermittent night sweats throughout this six month period as well. She denies lightheadedness, dizziness, abdominal pain, back pain, fever, chills, abnormal bruising/bleeding, bloody stools, epistaxis, hematuria, gingival bleeding, or other bleeding issues within this period. She has not received any blood transfusions and has not previously had a tattoo; she is unconcerned with prior HepC exposure.   Since her hernia surgery, she reports that she feels great symptomatically overall and much improved. She does have some issues with abdominal bloating and discomfort, but this has been manageable. No overt abdominal pain.   Throughout all of this, she has attempted to try to remain within good spirits. Losing her sister recently has been hard, but she has been working through it  with her family; however, her partner voices that they do not have strong social support in this area as most of their support is in [place] where they  previously lived. She has been working through her sister's death with grief counseling which has been helping her.    She has never previously smoked, but she reports some moderate second-hand smoke through her father at a younger age. Her father did die of a PE, but she is unaware of any other diagnoses of clotting disorders or cancers within her family. No h/o alcohol abuse, chemical/radiation exposures. She worked previously in Industrial/product designer.   PREVIOUS TREATMENT:  Rituximab weekly x4 beginning on 07/21/17-08/11/17 Received additional Rituxan weekly x 4 doses Rituximab  Weekly for 4 weeks starting 10/05/17 - completed 10/2017. S/p splenectomy at [facility] with Dr Shon Hough.  CURRENT TREATMENT:  Weekly Rituxan  INTERIM HISTORY:   Michelle Cohen returns today for scheduled follow up of her SMZL. We are joined today by her wife, Michelle Cohen. The patient's last visit with [place] was on 03/04/2020. The pt reports that she is doing well overall.  The pt reports that she is still experiencing itchy, red nodules on her scalp. She has been using topical tea tree oil which helps alleviate the itching for about an hour at a time. She continues to walk 5-6 miles per day. Pt is eating well and is able to complete her daily activities. Pt is undergoing some stress due to her mother's recent medical concerns.   Lab results today (05/06/20) of CBC w/diff and CMP is as follows: all values are WNL except for WBC at 78.0K, RBC at 3.67, Hgb at 11.5, HCT at 35.1, Lymphs Abs at 68.6K, Mono Abs at 3.9K, Eos Abs at 0.8K, WBC Morphology shows "Variant Lymphs Present", Smudge Cells are "Present", CO2 at 21. 05/06/2020 Uric acid at 5.8  On review of systems, pt reports itchy scalp nodules, stress and denies fatigue, fevers, chills, night sweats, new lumps/bumps, low appetite, unexpected weight loss, abdominal pain and any other symptoms.    MEDICAL HISTORY:  Past Medical History:  Diagnosis Date  .  B-cell lymphoma ([place]) 06/2017     SURGICAL HISTORY: Past Surgical History:  Procedure Laterality Date  . left inguinal hernia repair Left   . SPLENECTOMY, TOTAL  02/13/2018    SOCIAL HISTORY: Social History   Socioeconomic History  . Marital status: Single    Spouse name: Not on file  . Number of children: Not on file  . Years of education: Not on file  . Highest education level: Not on file  Occupational History  . Not on file  Tobacco Use  . Smoking status: Never Smoker  . Smokeless tobacco: Never Used  Vaping Use  . Vaping Use: Never used  Substance and Sexual Activity  . Alcohol use: Yes    Comment: glass of wine occasionally  . Drug use: No  . Sexual activity: Not on file  Other Topics Concern  . Not on file  Social History Narrative  . Not on file   Social Determinants of Health   Financial Resource Strain:   . Difficulty of Paying Living Expenses:   Food Insecurity:   . Worried About Charity fundraiser in the Last Year:   . Arboriculturist in the Last Year:   Transportation Needs:   . Film/video editor (Medical):   Marland Kitchen Lack of Transportation (Non-Medical):   Physical Activity:   . Days of  Exercise per Week:   . Minutes of Exercise per Session:   Stress:   . Feeling of Stress :   Social Connections:   . Frequency of Communication with Friends and Family:   . Frequency of Social Gatherings with Friends and Family:   . Attends Religious Services:   . Active Member of Clubs or Organizations:   . Attends Archivist Meetings:   Marland Kitchen Marital Status:   Intimate Partner Violence:   . Fear of Current or Ex-Partner:   . Emotionally Abused:   Marland Kitchen Physically Abused:   . Sexually Abused:     FAMILY HISTORY: Sister - lung cancer Mother -Alzheimers dementia  ALLERGIES:  is allergic to allopurinol and tape.  MEDICATIONS:  Current Outpatient Medications  Medication Sig Dispense Refill  . acetaminophen (TYLENOL) 500 MG tablet Take 500 mg by mouth  every 8 (eight) hours as needed for moderate pain.     Marland Kitchen acyclovir (ZOVIRAX) 200 MG capsule Take 2 capsules (400 mg total) by mouth 2 (two) times daily. 120 capsule 1  . albuterol (PROVENTIL HFA;VENTOLIN HFA) 108 (90 Base) MCG/ACT inhaler Inhale 1-2 puffs into the lungs every 6 (six) hours as needed for wheezing or shortness of breath. 1 Inhaler 0  . aspirin 81 MG EC tablet Take by mouth.    . benzonatate (TESSALON) 200 MG capsule Take 1 capsule (200 mg total) by mouth 3 (three) times daily as needed for cough. 30 capsule 0  . chlorhexidine (PERIDEX) 0.12 % solution SWISH WITH 15 MLS IN MOUTH OR THROAT THEN SPIT --USE TWICE DAILY AS DIRECTED 473 mL 1  . chlorpheniramine-HYDROcodone (TUSSIONEX PENNKINETIC ER) 10-8 MG/5ML SUER Take 5 mLs by mouth every 12 (twelve) hours as needed for cough. 60 mL 0  . Cyanocobalamin (VITAMIN B 12 PO) Take by mouth.    . Diphenhyd-Hydrocort-Nystatin (FIRST-DUKES MOUTHWASH MT) Take by mouth.    . docusate sodium (COLACE) 100 MG capsule Take 100 mg by mouth 2 (two) times daily.    Marland Kitchen docusate sodium (COLACE) 50 MG capsule Take by mouth.    . erythromycin ophthalmic ointment SMARTSIG:In Eye(s)    . fluticasone (FLONASE) 50 MCG/ACT nasal spray Place 2 sprays into both nostrils daily. 16 g 0  . folic acid (FOLVITE) 1 MG tablet Take by mouth.    . hydrOXYzine (ATARAX/VISTARIL) 25 MG tablet Take 1-2 tablets (25-50 mg total) by mouth every 4 (four) hours as needed. 30 tablet 0  . hydrOXYzine (VISTARIL) 25 MG capsule Take by mouth.    Derrill Memo ON 05/07/2020] ketoconazole (NIZORAL) 2 % shampoo Apply 1 application topically 2 (two) times a week. 120 mL 1  . Loratadine 10 MG CAPS Take 10 mg by mouth daily.    . magic mouthwash w/lidocaine SOLN Take 5 mLs by mouth 4 (four) times daily as needed for mouth pain. Swish and spit 1 Part viscous lidocaine 2% 1 Part Maalox (do not substitute Kaopectate) 1 Part diphenhydramine 12.5 mg per 5 ml elixir. 120 mL 2  . magic mouthwash  w/lidocaine SOLN Take 5 mLs by mouth 4 (four) times daily as needed for mouth pain. Swish and spit 120 mL 0  . moxifloxacin (VIGAMOX) 0.5 % ophthalmic solution     . oxyCODONE (OXY IR/ROXICODONE) 5 MG immediate release tablet Take by mouth.    . Prednisolon-Gatiflox-Bromfenac 1-0.5-0.075 % SUSP Place 1 drop into the right eye 4 (four) times daily.    . prednisoLONE acetate (PRED FORTE) 1 % ophthalmic suspension     .  predniSONE (DELTASONE) 20 MG tablet Take 2 tablets (40 mg total) by mouth daily with breakfast. Starting 5 days prior to 1st dose of Rituxan 60 tablet 0  . senna (SENOKOT) 8.6 MG TABS tablet Take 1 tablet by mouth daily as needed for mild constipation.     Marland Kitchen Spacer/Aero-Holding Chambers (AEROCHAMBER PLUS) inhaler Use as instructed 1 each 2  . triamcinolone cream (KENALOG) 0.1 % Apply 1 application topically 2 (two) times daily. 45 g 0   No current facility-administered medications for this visit.    REVIEW OF SYSTEMS:   A 10+ POINT REVIEW OF SYSTEMS WAS OBTAINED including neurology, dermatology, psychiatry, cardiac, respiratory, lymph, extremities, GI, GU, Musculoskeletal, constitutional, breasts, reproductive, HEENT.  All pertinent positives are noted in the HPI.  All others are negative.   PHYSICAL EXAMINATION:  ECOG PERFORMANCE STATUS: 1 BP 131/71 (BP Location: Left Arm, Patient Position: Sitting)   Pulse 60   Temp 98.5 F (36.9 C) (Oral)   Resp 18   Wt 162 lb (73.5 kg)   SpO2 99%   BMI 26.15 kg/m    GENERAL:alert, in no acute distress and comfortable SKIN: no acute rashes, no significant lesions EYES: conjunctiva are pink and non-injected, sclera anicteric OROPHARYNX: MMM, no exudates, no oropharyngeal erythema or ulceration NECK: supple, no JVD LYMPH:  no palpable lymphadenopathy in the cervical, axillary or inguinal regions LUNGS: clear to auscultation b/l with normal respiratory effort HEART: regular rate & rhythm ABDOMEN:  normoactive bowel sounds , non  tender, not distended. No palpable hepatosplenomegaly.  Extremity: no pedal edema PSYCH: alert & oriented x 3 with fluent speech NEURO: no focal motor/sensory deficits  LABORATORY DATA:   I have reviewed the data as listed   CBC Latest Ref Rng & Units 05/06/2020 03/04/2020 02/05/2020  WBC 4.0 - 10.5 K/uL 78.0(HH) 29.5(H) 82.8(HH)  Hemoglobin 12.0 - 15.0 g/dL 11.5(L) 12.6 11.6(L)  Hematocrit 36 - 46 % 35.1(L) 38.6 36.7  Platelets 150 - 400 K/uL 263 316 274  ANC 4.1k . CBC    Component Value Date/Time   WBC 78.0 (HH) 05/06/2020 0953   WBC 29.5 (H) 03/04/2020 0841   RBC 3.67 (L) 05/06/2020 0953   HGB 11.5 (L) 05/06/2020 0953   HGB 9.1 (L) 09/14/2017 0835   HCT 35.1 (L) 05/06/2020 0953   HCT 35.2 03/05/2018 1331   HCT 29.0 (L) 09/14/2017 0835   PLT 263 05/06/2020 0953   PLT 46 (L) 09/14/2017 0835   MCV 95.6 05/06/2020 0953   MCV 92.1 09/14/2017 0835   MCH 31.3 05/06/2020 0953   MCHC 32.8 05/06/2020 0953   RDW 15.2 05/06/2020 0953   RDW 18.2 (H) 09/14/2017 0835   LYMPHSABS 68.6 (H) 05/06/2020 0953   LYMPHSABS 2.0 09/14/2017 0835   MONOABS 3.9 (H) 05/06/2020 0953   MONOABS 0.3 09/14/2017 0835   EOSABS 0.8 (H) 05/06/2020 0953   EOSABS 0.0 09/14/2017 0835   BASOSABS 0.0 05/06/2020 0953   BASOSABS 0.0 09/14/2017 0835    . CMP Latest Ref Rng & Units 05/06/2020 03/04/2020 02/05/2020  Glucose 70 - 99 mg/dL 94 88 84  BUN 8 - 23 mg/dL '13 14 14  ' Creatinine 0.44 - 1.00 mg/dL 0.74 0.85 0.78  Sodium 135 - 145 mmol/L 141 142 140  Potassium 3.5 - 5.1 mmol/L 4.0 4.5 3.6  Chloride 98 - 111 mmol/L 108 105 105  CO2 22 - 32 mmol/L 21(L) 22 23  Calcium 8.9 - 10.3 mg/dL 9.5 9.3 9.3  Total Protein 6.5 - 8.1 g/dL  7.3 7.6 7.6  Total Bilirubin 0.3 - 1.2 mg/dL 1.0 0.9 1.1  Alkaline Phos 38 - 126 U/L 63 50 64  AST 15 - 41 U/L '22 22 25  ' ALT 0 - 44 U/L '13 21 26   ' Component     Latest Ref Rng & Units 07/13/2017  LDH     125 - 245 U/L 232  Hep C Virus Ab     0.0 - 0.9 s/co ratio <0.1  HIV  Screen 4th Generation wRfx     Non Reactive Non Reactive  Hep B Core Ab, Tot     Negative Negative  Hepatitis B Surface Ag     Negative Negative   Component     Latest Ref Rng & Units 07/28/2017  LDH     125 - 245 U/L 200  Uric Acid, Serum     2.6 - 7.4 mg/dl 6.0  Phosphorus     2.5 - 4.5 mg/dL 3.8   03/05/18 Pathology Report Splenectomy:      RADIOGRAPHIC STUDIES: I have personally reviewed the radiological images as listed and agreed with the findings in the report. No results found.   ASSESSMENT & PLAN:   Niamya Vittitow is a wonderful 69 y.o. 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  Her clinical presentation is also representative of this and that this may have been present over months to possibly years.  -With her pattern of involvement and results thus far it is likely that she has splenic marginal zone type lymphoma.  -PET scan on 07/25/2017 with results showing: Massive splenomegaly, splenic volume 5770 cubic cm, with homogeneous low grade activity throughout the spleen equal to that of the physiologic activity in the liver, and above the background blood pool activity in the mediastinum. No pathologic adenopathy identified. No bony involvement noted.   -CT A/p on 08/23/17 with results of: Marked splenomegaly without substantial interval change in splenic size/volume. Borderline lymphadenopathy in the hepatoduodenal ligament. Aortic Atherosclerosis. I discussed this with the patient in great detail.  -CT A/p on 09/15/17 shows significant splenomegaly shows minimal smaller change in size, with  no evidence of bowel obstruction.  -12/31/2019 CT C/A/P ([id number]) ([id number]) revealed "1. 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 Although there are scattered tiny lymph nodes in the chest, no lymphadenopathy by size criteria in the thorax or pelvis. 2. Hepatomegaly. 3. Prior splenectomy. 4. Enlargement of the pulmonary outflow tract and main pulmonary arteries suggests pulmonary arterial hypertension. 5.  Aortic Atherosclerois (ICD10-170.0)."   2. Pancytopenia s/p thrombocytopenia and anemia  Due to SMZL and hypersplenism Now resolved  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 Had to complete Rituxan at lower rate. Patient would not be a candidate for rapid infusion protocol and would need to keep the next infusion at the tolerated rate and not rate escalate. Plan  Completed with extensive pre-medications.  4. Oral mucositis/HSV outbreak - resolved -continue with acyclovir as needed   PLAN: -Discussed pt labwork today, 05/06/20; Hgb is stable, PLT are nml, WBC are  increasing, blood chemistries are nml, Uric acid is WNL -The pt has no prohibitive toxicites from beginning maintentace Rituxan every 2 months at this time.  -Would continue maintenance Rituxan for up to 3 years, unless progression or intolerance. Would also discontinue if WBC normalize.  -Discussed again that if we decided to treat more aggressively we would add in Bendamustine - will hold off for now.per patient preference. -Recommend OTC Nizoral -Rx 2% Ketoconoazole shampoo -Will see back in 2 months with labs and next maintenance treatment    FOLLOW UP: Plz schedule next dose of maintenance Rituxan with labs and MD visit in 2 months   The total time spent in the appt was 20 minutes and more than 50% was on counseling and direct patient cares.  All of the  patient's questions were answered with apparent satisfaction. The patient knows to call the clinic with any problems, questions or concerns.   Sullivan Lone MD [place] AAHIVMS [facility] Hematology/Oncology Physician [facility]  (Office):       [phone] (Work cell):  [phone] (Fax):           [phone]   I, Yevette Edwards, am acting as a scribe for Dr. Sullivan Lone.   .I have reviewed the above documentation for accuracy and completeness, and I agree with the above. Brunetta Genera MD

## 2020-05-06 NOTE — Patient Instructions (Signed)
[facility] Discharge Instructions for Patients Receiving Chemotherapy  Today you received the following chemotherapy agents:  Rituxan.  To help prevent nausea and vomiting after your treatment, we encourage you to take your nausea medication as directed.   If you develop nausea and vomiting that is not controlled by your nausea medication, call the clinic.   BELOW ARE SYMPTOMS THAT SHOULD BE REPORTED IMMEDIATELY:  *FEVER GREATER THAN 100.5 F  *CHILLS WITH OR WITHOUT FEVER  NAUSEA AND VOMITING THAT IS NOT CONTROLLED WITH YOUR NAUSEA MEDICATION  *UNUSUAL SHORTNESS OF BREATH  *UNUSUAL BRUISING OR BLEEDING  TENDERNESS IN MOUTH AND THROAT WITH OR WITHOUT PRESENCE OF ULCERS  *URINARY PROBLEMS  *BOWEL PROBLEMS  UNUSUAL RASH Items with * indicate a potential emergency and should be followed up as soon as possible.  Feel free to call the clinic should you have any questions or concerns. The clinic phone number is [phone].  Please show the CHEMO ALERT CARD at check-in to the Emergency Department and triage nurse.   

## 2020-05-06 NOTE — Progress Notes (Signed)
05/06/20  Increase future dose of zofran to 8 mg IVPush prior to rituximab.  Plan updated as above.  T.O. Dr Cleda Clarks, PharmD

## 2020-05-09 MED FILL — ACYCLOVIR 200 MG CAP: 200 | 30 days supply | Qty: 120 | Fill #1

## 2020-06-10 ENCOUNTER — Telehealth: Payer: Self-pay | Admitting: Hematology

## 2020-06-10 NOTE — Telephone Encounter (Signed)
Scheduled per 08/11 los, patient has been called and notified. 

## 2020-06-11 MED FILL — KETOCONAZOLE 2% SHAMPOO: 2 | 30 days supply | Qty: 120 | Fill #1

## 2020-06-17 ENCOUNTER — Encounter: Payer: Self-pay | Admitting: Hematology

## 2020-06-17 DIAGNOSIS — Z23 Encounter for immunization: Secondary | ICD-10-CM | POA: Diagnosis not present

## 2020-06-22 DIAGNOSIS — L218 Other seborrheic dermatitis: Secondary | ICD-10-CM | POA: Diagnosis not present

## 2020-06-22 DIAGNOSIS — L309 Dermatitis, unspecified: Secondary | ICD-10-CM | POA: Diagnosis not present

## 2020-06-22 MED FILL — FLUOCINONIDE 0.05% SOLUTION: 0.05 | 30 days supply | Qty: 60 | Fill #0

## 2020-06-22 MED FILL — BETAMETHASONE DP AUG 0.05%: 0.05 | 30 days supply | Qty: 50 | Fill #0

## 2020-06-29 ENCOUNTER — Encounter: Payer: Self-pay | Admitting: Hematology

## 2020-06-30 DIAGNOSIS — Z23 Encounter for immunization: Secondary | ICD-10-CM | POA: Diagnosis not present

## 2020-07-03 ENCOUNTER — Other Ambulatory Visit: Payer: Self-pay | Admitting: Hematology

## 2020-07-06 ENCOUNTER — Other Ambulatory Visit: Payer: Self-pay | Admitting: *Deleted

## 2020-07-06 ENCOUNTER — Inpatient Hospital Stay: Payer: Medicare Other | Attending: Hematology

## 2020-07-06 ENCOUNTER — Inpatient Hospital Stay (HOSPITAL_BASED_OUTPATIENT_CLINIC_OR_DEPARTMENT_OTHER): Payer: Medicare Other | Admitting: Hematology

## 2020-07-06 ENCOUNTER — Other Ambulatory Visit: Payer: Self-pay

## 2020-07-06 ENCOUNTER — Inpatient Hospital Stay: Payer: Medicare Other

## 2020-07-06 VITALS — BP 132/77 | HR 62 | Temp 98.2°F | Resp 18 | Ht 66.0 in | Wt 160.1 lb

## 2020-07-06 DIAGNOSIS — C8307 Small cell B-cell lymphoma, spleen: Secondary | ICD-10-CM

## 2020-07-06 DIAGNOSIS — C884 Extranodal marginal zone B-cell lymphoma of mucosa-associated lymphoid tissue [MALT-lymphoma]: Secondary | ICD-10-CM | POA: Insufficient documentation

## 2020-07-06 DIAGNOSIS — D61818 Other pancytopenia: Secondary | ICD-10-CM | POA: Diagnosis not present

## 2020-07-06 DIAGNOSIS — Z5112 Encounter for antineoplastic immunotherapy: Secondary | ICD-10-CM

## 2020-07-06 DIAGNOSIS — Z5111 Encounter for antineoplastic chemotherapy: Secondary | ICD-10-CM | POA: Insufficient documentation

## 2020-07-06 DIAGNOSIS — Z7189 Other specified counseling: Secondary | ICD-10-CM

## 2020-07-06 LAB — CMP (CANCER CENTER ONLY)
ALT: 14 U/L (ref 0–44)
AST: 16 U/L (ref 15–41)
Albumin: 3.4 g/dL — ABNORMAL LOW (ref 3.5–5.0)
Alkaline Phosphatase: 62 U/L (ref 38–126)
Anion gap: 7 (ref 5–15)
BUN: 14 mg/dL (ref 8–23)
CO2: 23 mmol/L (ref 22–32)
Calcium: 8.9 mg/dL (ref 8.9–10.3)
Chloride: 108 mmol/L (ref 98–111)
Creatinine: 0.75 mg/dL (ref 0.44–1.00)
GFR, Estimated: >60 mL/min (ref >60)
Glucose, Bld: 90 mg/dL (ref 70–99)
Potassium: 3.9 mmol/L (ref 3.5–5.1)
Sodium: 138 mmol/L (ref 135–145)
Total Bilirubin: 0.9 mg/dL (ref 0.3–1.2)
Total Protein: 7.2 g/dL (ref 6.5–8.1)

## 2020-07-06 LAB — CBC WITH DIFFERENTIAL (CANCER CENTER ONLY)
Abs Immature Granulocytes: 0 10*3/uL (ref 0.00–0.07)
Basophils Absolute: 0.3 10*3/uL — ABNORMAL HIGH (ref 0.0–0.1)
Basophils Relative: 1 %
Eosinophils Absolute: 0.6 10*3/uL — ABNORMAL HIGH (ref 0.0–0.5)
Eosinophils Relative: 2 %
HCT: 35.3 % — ABNORMAL LOW (ref 36.0–46.0)
Hemoglobin: 11.6 g/dL — ABNORMAL LOW (ref 12.0–15.0)
Lymphocytes Relative: 82 %
Lymphs Abs: 26.5 10*3/uL — ABNORMAL HIGH (ref 0.7–4.0)
MCH: 30.7 pg (ref 26.0–34.0)
MCHC: 32.9 g/dL (ref 30.0–36.0)
MCV: 93.4 fL (ref 80.0–100.0)
Monocytes Absolute: 1.3 10*3/uL — ABNORMAL HIGH (ref 0.1–1.0)
Monocytes Relative: 4 %
Neutro Abs: 3.6 10*3/uL (ref 1.7–7.7)
Neutrophils Relative %: 11 %
Platelet Count: 323 10*3/uL (ref 150–400)
RBC: 3.78 MIL/uL — ABNORMAL LOW (ref 3.87–5.11)
RDW: 14.7 % (ref 11.5–15.5)
WBC Count: 32.3 10*3/uL — ABNORMAL HIGH (ref 4.0–10.5)
nRBC: 0 % (ref 0.0–0.2)

## 2020-07-06 LAB — URIC ACID: Uric Acid, Serum: 6.8 mg/dL (ref 2.5–7.1)

## 2020-07-06 MED ORDER — FAMOTIDINE IN NACL 20-0.9 MG/50ML-% IV SOLN
20.0000 mg | Freq: Once | INTRAVENOUS | Status: AC
  Administered 2020-07-06: 20 mg via INTRAVENOUS

## 2020-07-06 MED ORDER — ACETAMINOPHEN 500 MG PO TABS
ORAL_TABLET | ORAL | Status: AC
  Filled 2020-07-06: qty 2

## 2020-07-06 MED ORDER — ACETAMINOPHEN 500 MG PO TABS
1000.0000 mg | ORAL_TABLET | Freq: Once | ORAL | Status: AC
  Administered 2020-07-06: 1000 mg via ORAL

## 2020-07-06 MED ORDER — MONTELUKAST SODIUM 10 MG PO TABS
10.0000 mg | ORAL_TABLET | Freq: Once | ORAL | Status: AC
  Administered 2020-07-06: 10 mg via ORAL

## 2020-07-06 MED ORDER — LORAZEPAM 1 MG PO TABS
ORAL_TABLET | ORAL | Status: AC
  Filled 2020-07-06: qty 1

## 2020-07-06 MED ORDER — MORPHINE SULFATE 4 MG/ML IJ SOLN
2.0000 mg | Freq: Once | INTRAMUSCULAR | Status: AC
  Administered 2020-07-06: 2 mg via INTRAVENOUS
  Filled 2020-07-06: qty 1

## 2020-07-06 MED ORDER — METHYLPREDNISOLONE SODIUM SUCC 125 MG IJ SOLR
INTRAMUSCULAR | Status: AC
  Filled 2020-07-06: qty 2

## 2020-07-06 MED ORDER — DIPHENHYDRAMINE HCL 50 MG/ML IJ SOLN
50.0000 mg | Freq: Once | INTRAMUSCULAR | Status: AC
  Administered 2020-07-06: 50 mg via INTRAVENOUS

## 2020-07-06 MED ORDER — MONTELUKAST SODIUM 10 MG PO TABS
ORAL_TABLET | ORAL | Status: AC
  Filled 2020-07-06: qty 1

## 2020-07-06 MED ORDER — ONDANSETRON HCL 4 MG/2ML IJ SOLN
8.0000 mg | Freq: Once | INTRAMUSCULAR | Status: AC
  Administered 2020-07-06: 8 mg via INTRAVENOUS

## 2020-07-06 MED ORDER — MORPHINE SULFATE (PF) 4 MG/ML IV SOLN
INTRAVENOUS | Status: AC
  Filled 2020-07-06: qty 1

## 2020-07-06 MED ORDER — FAMOTIDINE IN NACL 20-0.9 MG/50ML-% IV SOLN
INTRAVENOUS | Status: AC
  Filled 2020-07-06: qty 50

## 2020-07-06 MED ORDER — SODIUM CHLORIDE 0.9 % IV SOLN
Freq: Once | INTRAVENOUS | Status: AC
  Filled 2020-07-06: qty 250

## 2020-07-06 MED ORDER — ONDANSETRON HCL 4 MG/2ML IJ SOLN
INTRAMUSCULAR | Status: AC
  Filled 2020-07-06: qty 4

## 2020-07-06 MED ORDER — DIPHENHYDRAMINE HCL 50 MG/ML IJ SOLN
INTRAMUSCULAR | Status: AC
  Filled 2020-07-06: qty 1

## 2020-07-06 MED ORDER — LORAZEPAM 1 MG PO TABS
0.5000 mg | ORAL_TABLET | Freq: Once | ORAL | Status: AC
  Administered 2020-07-06: 0.5 mg via ORAL

## 2020-07-06 MED ORDER — LORAZEPAM 2 MG/ML IJ SOLN
INTRAMUSCULAR | Status: AC
  Filled 2020-07-06: qty 1

## 2020-07-06 MED ORDER — METHYLPREDNISOLONE SODIUM SUCC 125 MG IJ SOLR
125.0000 mg | Freq: Once | INTRAMUSCULAR | Status: AC
  Administered 2020-07-06: 125 mg via INTRAVENOUS

## 2020-07-06 MED ORDER — SODIUM CHLORIDE 0.9 % IV SOLN
375.0000 mg/m2 | Freq: Once | INTRAVENOUS | Status: AC
  Administered 2020-07-06: 700 mg via INTRAVENOUS
  Filled 2020-07-06: qty 50

## 2020-07-06 NOTE — Patient Instructions (Signed)
[facility] °Discharge Instructions for Patients Receiving Chemotherapy ° °Today you received the following chemotherapy agents: Rituximab ° °To help prevent nausea and vomiting after your treatment, we encourage you to take your nausea medication as directed.  °  °If you develop nausea and vomiting that is not controlled by your nausea medication, call the clinic.  ° °BELOW ARE SYMPTOMS THAT SHOULD BE REPORTED IMMEDIATELY: °· *FEVER GREATER THAN 100.5 F °· *CHILLS WITH OR WITHOUT FEVER °· NAUSEA AND VOMITING THAT IS NOT CONTROLLED WITH YOUR NAUSEA MEDICATION °· *UNUSUAL SHORTNESS OF BREATH °· *UNUSUAL BRUISING OR BLEEDING °· TENDERNESS IN MOUTH AND THROAT WITH OR WITHOUT PRESENCE OF ULCERS °· *URINARY PROBLEMS °· *BOWEL PROBLEMS °· UNUSUAL RASH °Items with * indicate a potential emergency and should be followed up as soon as possible. ° °Feel free to call the clinic should you have any questions or concerns. The clinic phone number is [phone]. ° °Please show the CHEMO ALERT CARD at check-in to the Emergency Department and triage nurse. ° ° °

## 2020-07-06 NOTE — Progress Notes (Signed)
HEMATOLOGY/ONCOLOGY FOLLOW UP NOTE  Date of Service:  07/06/20     Patient Care Team: Alroy Dust, L.Marlou Sa, MD as PCP - General (Family Medicine)  CHIEF COMPLAINTS F/u for continued management of SMZL   HISTORY OF PRESENTING ILLNESS:   Michelle Cohen is a wonderful 69 y.o. female who has been referred to [place] from [facility] for evaluation and management of B-cell lymphoma. She presents to her appointment today with her partner of 21 years. She states that she moved to the area ~2 years ago secondary to her sister's diagnosis of lung cancer (unfortunately, now passed) and taking care of her mother's Alzheimer's. Generally, prior to this issue she has been very healthy and without chronic medical conditions and not on chronic medical therapies/treatments.   Initially, the patient was admitted to [facility] for abdominal pain and significant hernia. She reports that she was visiting in [place] and while flying there she had an acute onset of sweats and left lower quadrant pain. She reports that her hernia was not present at that time, but as she continued to fly her hernia had become more noticeable and enlarged in her lower abdomen. Additionally, her partner reports that her episode of hydrosis was very significant, stating that it was pooling below her. On their arrival to [facility] on 06/21/17, a CT A/P was performed which showed an incarcerated hernia. Of note, her CT showed massive splenomegaly at 32cm in greatest dimension. Manual reduction of her hernia was attempted while in the ED but was unsuccessful. Pre-operative notes state that following intubation manual reduction was performed successfully and hernia was repaired laparoscopically. CT CAP was performed as well which was without the presence of additional surrounding LAD. She did have flow cytometry performed during her admission as well which showed B-cell lymphocytes which were monoclonal. A bone marrow biopsy was  also examined which showed that it was markedly hypercellular (~95%) with prominent interstitial infiltrate of small lymphocytes.   Prior to her diagnosis, she mostly felt typically well overall. She states that she did suffer from insomnia which she related to over-thinking at the time regarding her familial situation. She did experience some fatigue and weight loss with this as well. Her partner reports that she noticed her weight loss approximately 19moago, which she quantifies at approximately 30lbs since onset. Towards the end of this six month periods and just prior to the appearance of her hernia the patient began to notice some bothersome abdominal distension. She also noticed some significant fatigue and she would nap for longer periods following her daily workouts. Additionally, her partner would notice that she would have issues with breathing and this appeared irregular and more labored, especially at night while laying down. She also mentions that she did experience intermittent night sweats throughout this six month period as well. She denies lightheadedness, dizziness, abdominal pain, back pain, fever, chills, abnormal bruising/bleeding, bloody stools, epistaxis, hematuria, gingival bleeding, or other bleeding issues within this period. She has not received any blood transfusions and has not previously had a tattoo; she is unconcerned with prior HepC exposure.   Since her hernia surgery, she reports that she feels great symptomatically overall and much improved. She does have some issues with abdominal bloating and discomfort, but this has been manageable. No overt abdominal pain.   Throughout all of this, she has attempted to try to remain within good spirits. Losing her sister recently has been hard, but she has been working through it with  her family; however, her partner voices that they do not have strong social support in this area as most of their support is in [place] where they  previously lived. She has been working through her sister's death with grief counseling which has been helping her.    She has never previously smoked, but she reports some moderate second-hand smoke through her father at a younger age. Her father did die of a PE, but she is unaware of any other diagnoses of clotting disorders or cancers within her family. No h/o alcohol abuse, chemical/radiation exposures. She worked previously in Industrial/product designer.   PREVIOUS TREATMENT:  Rituximab weekly x4 beginning on 07/21/17-08/11/17 Received additional Rituxan weekly x 4 doses Rituximab  Weekly for 4 weeks starting 10/05/17 - completed 10/2017. S/p splenectomy at [place] with Dr Shon Hough.  CURRENT TREATMENT:  Weekly Rituxan  INTERIM HISTORY:  Michelle Cohen returns today for scheduled follow up of her SMZL. We are joined today by her wife, Caren Griffins. The patient's last visit with [place] was on 05/06/2020. The pt reports that she is doing well overall.  The pt reports that she has been to the Dermatologist and her itchy scalp has resolved. She is eating well and continues to stay active. Her mother has recently passed away. Pt appears to be doing okay from an emotional standpoint. Pt has started following with a Primary Care Physician. She has received her COVID19 booster and reports that she is up to date on other age-appropriate vaccines.   Lab results today (07/06/20) of CBC w/diff and CMP is as follows: all values are WNL except for WBC at 32.3K, RBC at 3.78, Hgb at 11.6, HCT at 35.3, Albumin at 3.4. 07/06/2020 Uric acid at 6.8  On review of systems, pt denies itchy scalp, fevers, chills, night sweats, unexpected weight loss and any other symptoms.   MEDICAL HISTORY:  Past Medical History:  Diagnosis Date  . B-cell lymphoma ([place]) 06/2017     SURGICAL HISTORY: Past Surgical History:  Procedure Laterality Date  . left inguinal hernia repair Left   . SPLENECTOMY, TOTAL  02/13/2018     SOCIAL HISTORY: Social History   Socioeconomic History  . Marital status: Single    Spouse name: Not on file  . Number of children: Not on file  . Years of education: Not on file  . Highest education level: Not on file  Occupational History  . Not on file  Tobacco Use  . Smoking status: Never Smoker  . Smokeless tobacco: Never Used  Vaping Use  . Vaping Use: Never used  Substance and Sexual Activity  . Alcohol use: Yes    Comment: glass of wine occasionally  . Drug use: No  . Sexual activity: Not on file  Other Topics Concern  . Not on file  Social History Narrative  . Not on file   Social Determinants of Health   Financial Resource Strain:   . Difficulty of Paying Living Expenses: Not on file  Food Insecurity:   . Worried About Charity fundraiser in the Last Year: Not on file  . Ran Out of Food in the Last Year: Not on file  Transportation Needs:   . Lack of Transportation (Medical): Not on file  . Lack of Transportation (Non-Medical): Not on file  Physical Activity:   . Days of Exercise per Week: Not on file  . Minutes of Exercise per Session: Not on file  Stress:   . Feeling of Stress :  Not on file  Social Connections:   . Frequency of Communication with Friends and Family: Not on file  . Frequency of Social Gatherings with Friends and Family: Not on file  . Attends Religious Services: Not on file  . Active Member of Clubs or Organizations: Not on file  . Attends Archivist Meetings: Not on file  . Marital Status: Not on file  Intimate Partner Violence:   . Fear of Current or Ex-Partner: Not on file  . Emotionally Abused: Not on file  . Physically Abused: Not on file  . Sexually Abused: Not on file    FAMILY HISTORY: Sister - lung cancer Mother -Alzheimers dementia  ALLERGIES:  is allergic to allopurinol and tape.  MEDICATIONS:  Current Outpatient Medications  Medication Sig Dispense Refill  . acetaminophen (TYLENOL) 500 MG tablet  Take 500 mg by mouth every 8 (eight) hours as needed for moderate pain.     Marland Kitchen acyclovir (ZOVIRAX) 200 MG capsule Take 2 capsules (400 mg total) by mouth 2 (two) times daily. 120 capsule 1  . albuterol (PROVENTIL HFA;VENTOLIN HFA) 108 (90 Base) MCG/ACT inhaler Inhale 1-2 puffs into the lungs every 6 (six) hours as needed for wheezing or shortness of breath. 1 Inhaler 0  . aspirin 81 MG EC tablet Take by mouth.    . benzonatate (TESSALON) 200 MG capsule Take 1 capsule (200 mg total) by mouth 3 (three) times daily as needed for cough. 30 capsule 0  . chlorhexidine (PERIDEX) 0.12 % solution SWISH WITH 15 MLS IN MOUTH OR THROAT THEN SPIT --USE TWICE DAILY AS DIRECTED 473 mL 1  . chlorpheniramine-HYDROcodone (TUSSIONEX PENNKINETIC ER) 10-8 MG/5ML SUER Take 5 mLs by mouth every 12 (twelve) hours as needed for cough. 60 mL 0  . Cyanocobalamin (VITAMIN B 12 PO) Take by mouth.    . Diphenhyd-Hydrocort-Nystatin (FIRST-DUKES MOUTHWASH MT) Take by mouth.    . docusate sodium (COLACE) 100 MG capsule Take 100 mg by mouth 2 (two) times daily.    Marland Kitchen docusate sodium (COLACE) 50 MG capsule Take by mouth.    . erythromycin ophthalmic ointment SMARTSIG:In Eye(s)    . fluticasone (FLONASE) 50 MCG/ACT nasal spray Place 2 sprays into both nostrils daily. 16 g 0  . folic acid (FOLVITE) 1 MG tablet Take by mouth.    . hydrOXYzine (ATARAX/VISTARIL) 25 MG tablet Take 1-2 tablets (25-50 mg total) by mouth every 4 (four) hours as needed. 30 tablet 0  . hydrOXYzine (VISTARIL) 25 MG capsule Take by mouth.    Marland Kitchen ketoconazole (NIZORAL) 2 % shampoo Apply 1 application topically 2 (two) times a week. 120 mL 1  . Loratadine 10 MG CAPS Take 10 mg by mouth daily.    . magic mouthwash w/lidocaine SOLN Take 5 mLs by mouth 4 (four) times daily as needed for mouth pain. Swish and spit 1 Part viscous lidocaine 2% 1 Part Maalox (do not substitute Kaopectate) 1 Part diphenhydramine 12.5 mg per 5 ml elixir. 120 mL 2  . magic mouthwash  w/lidocaine SOLN Take 5 mLs by mouth 4 (four) times daily as needed for mouth pain. Swish and spit 120 mL 0  . moxifloxacin (VIGAMOX) 0.5 % ophthalmic solution     . oxyCODONE (OXY IR/ROXICODONE) 5 MG immediate release tablet Take by mouth.    . Prednisolon-Gatiflox-Bromfenac 1-0.5-0.075 % SUSP Place 1 drop into the right eye 4 (four) times daily.    . prednisoLONE acetate (PRED FORTE) 1 % ophthalmic suspension     .  predniSONE (DELTASONE) 20 MG tablet Take 2 tablets (40 mg total) by mouth daily with breakfast. Starting 5 days prior to 1st dose of Rituxan 60 tablet 0  . senna (SENOKOT) 8.6 MG TABS tablet Take 1 tablet by mouth daily as needed for mild constipation.     Marland Kitchen Spacer/Aero-Holding Chambers (AEROCHAMBER PLUS) inhaler Use as instructed 1 each 2  . triamcinolone cream (KENALOG) 0.1 % Apply 1 application topically 2 (two) times daily. 45 g 0   No current facility-administered medications for this visit.    REVIEW OF SYSTEMS:   A 10+ POINT REVIEW OF SYSTEMS WAS OBTAINED including neurology, dermatology, psychiatry, cardiac, respiratory, lymph, extremities, GI, GU, Musculoskeletal, constitutional, breasts, reproductive, HEENT.  All pertinent positives are noted in the HPI.  All others are negative.   PHYSICAL EXAMINATION:  ECOG PERFORMANCE STATUS: 1 BP 132/77 (BP Location: Left Arm, Patient Position: Sitting)   Pulse 62   Temp 98.2 F (36.8 C) (Tympanic)   Resp 18   Ht '5\' 6"'  (1.676 m)   Wt 160 lb 1.6 oz (72.6 kg)   SpO2 100%   BMI 25.84 kg/m    GENERAL:alert, in no acute distress and comfortable SKIN: no acute rashes, no significant lesions EYES: conjunctiva are pink and non-injected, sclera anicteric OROPHARYNX: MMM, no exudates, no oropharyngeal erythema or ulceration NECK: supple, no JVD LYMPH:  no palpable lymphadenopathy in the cervical, axillary or inguinal regions LUNGS: clear to auscultation b/l with normal respiratory effort HEART: regular rate & rhythm ABDOMEN:   normoactive bowel sounds , non tender, not distended. No palpable hepatosplenomegaly.  Extremity: no pedal edema PSYCH: alert & oriented x 3 with fluent speech NEURO: no focal motor/sensory deficits  LABORATORY DATA:   I have reviewed the data as listed   CBC Latest Ref Rng & Units 07/06/2020 05/06/2020 03/04/2020  WBC 4.0 - 10.5 K/uL 32.3(H) 78.0(HH) 29.5(H)  Hemoglobin 12.0 - 15.0 g/dL 11.6(L) 11.5(L) 12.6  Hematocrit 36 - 46 % 35.3(L) 35.1(L) 38.6  Platelets 150 - 400 K/uL 323 263 316  ANC 4.1k . CBC    Component Value Date/Time   WBC 32.3 (H) 07/06/2020 0915   WBC 29.5 (H) 03/04/2020 0841   RBC 3.78 (L) 07/06/2020 0915   HGB 11.6 (L) 07/06/2020 0915   HGB 9.1 (L) 09/14/2017 0835   HCT 35.3 (L) 07/06/2020 0915   HCT 35.2 03/05/2018 1331   HCT 29.0 (L) 09/14/2017 0835   PLT 323 07/06/2020 0915   PLT 46 (L) 09/14/2017 0835   MCV 93.4 07/06/2020 0915   MCV 92.1 09/14/2017 0835   MCH 30.7 07/06/2020 0915   MCHC 32.9 07/06/2020 0915   RDW 14.7 07/06/2020 0915   RDW 18.2 (H) 09/14/2017 0835   LYMPHSABS PENDING 07/06/2020 0915   LYMPHSABS 2.0 09/14/2017 0835   MONOABS PENDING 07/06/2020 0915   MONOABS 0.3 09/14/2017 0835   EOSABS PENDING 07/06/2020 0915   EOSABS 0.0 09/14/2017 0835   BASOSABS PENDING 07/06/2020 0915   BASOSABS 0.0 09/14/2017 0835    . CMP Latest Ref Rng & Units 07/06/2020 05/06/2020 03/04/2020  Glucose 70 - 99 mg/dL 90 94 88  BUN 8 - 23 mg/dL '14 13 14  ' Creatinine 0.44 - 1.00 mg/dL 0.75 0.74 0.85  Sodium 135 - 145 mmol/L 138 141 142  Potassium 3.5 - 5.1 mmol/L 3.9 4.0 4.5  Chloride 98 - 111 mmol/L 108 108 105  CO2 22 - 32 mmol/L 23 21(L) 22  Calcium 8.9 - 10.3 mg/dL 8.9 9.5 9.3  Total Protein 6.5 - 8.1 g/dL 7.2 7.3 7.6  Total Bilirubin 0.3 - 1.2 mg/dL 0.9 1.0 0.9  Alkaline Phos 38 - 126 U/L 62 63 50  AST 15 - 41 U/L '16 22 22  ' ALT 0 - 44 U/L '14 13 21   ' Component     Latest Ref Rng & Units 07/13/2017  LDH     125 - 245 U/L 232  Hep C Virus Ab      0.0 - 0.9 s/co ratio <0.1  HIV Screen 4th Generation wRfx     Non Reactive Non Reactive  Hep B Core Ab, Tot     Negative Negative  Hepatitis B Surface Ag     Negative Negative   Component     Latest Ref Rng & Units 07/28/2017  LDH     125 - 245 U/L 200  Uric Acid, Serum     2.6 - 7.4 mg/dl 6.0  Phosphorus     2.5 - 4.5 mg/dL 3.8   03/05/18 Pathology Report Splenectomy:      RADIOGRAPHIC STUDIES: I have personally reviewed the radiological images as listed and agreed with the findings in the report. No results found.   ASSESSMENT & PLAN:   Lovada Barwick is a wonderful 69 y.o. 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  Her clinical presentation is also representative of this and that this may have been present over months to possibly years.  -With her pattern of involvement and results thus far it is likely that she has splenic marginal zone type lymphoma.  -PET scan on 07/25/2017 with results showing: Massive splenomegaly, splenic volume 5770 cubic cm, with homogeneous low grade activity throughout the spleen equal to that of the physiologic activity in the liver, and above the background blood pool activity in the mediastinum. No pathologic adenopathy identified. No bony involvement noted.   -CT A/p on 08/23/17 with results of: Marked splenomegaly without substantial interval change in splenic size/volume. Borderline lymphadenopathy in the hepatoduodenal ligament. Aortic Atherosclerosis. I discussed this with the patient in great detail.  -CT A/p on 09/15/17 shows significant splenomegaly shows  minimal smaller change in size, with no evidence of bowel obstruction.  -12/31/2019 CT C/A/P ([id number]) ([id number]) revealed "1. 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 Although there are scattered tiny lymph nodes in the chest, no lymphadenopathy by size criteria in the thorax or pelvis. 2. Hepatomegaly. 3. Prior splenectomy. 4. Enlargement of the pulmonary outflow tract and main pulmonary arteries suggests pulmonary arterial hypertension. 5.  Aortic Atherosclerois (ICD10-170.0)."   2. Pancytopenia s/p thrombocytopenia and anemia  Due to SMZL and hypersplenism Now resolved  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 Had to complete Rituxan at lower rate. Patient would not be a candidate for rapid infusion protocol and would need to keep the next infusion at the tolerated rate and not rate escalate. Plan  Completed with extensive pre-medications.  4. Oral mucositis/HSV outbreak - resolved -continue with acyclovir as needed   PLAN: -Discussed pt labwork today, 07/06/20; Hgb is  stable, WBC have improved, blood chemistries look good, Uric acid is WNL -The pt has no prohibitive toxicites from continuing maintentace Rituxan every 2 months at this time. -Would continue maintenance Rituxan for up to 3 years, unless progression or intolerance. Would also discontinue if WBC normalize.  -Advised pt again that we could add in Bendamustine to treat more aggressively - pt prefers to continue conservative treatment. -Recommended that the pt continue to eat well, drink at least 48-64 oz of water each day, and walk 20-30 minutes each day.  -Will see back in 2 months with labs    FOLLOW UP: Plz schedule next 2 doses of maintenance Rituxan with labs and MD visit  every 2 months   The total time spent in the appt was 30 minutes and more than 50%  was on counseling and direct patient cares, ordering and management of Rituxan immunotherapy  All of the patient's questions were answered with apparent satisfaction. The patient knows to call the clinic with any problems, questions or concerns.   Sullivan Lone MD [place] AAHIVMS [facility] Hematology/Oncology Physician [facility]  (Office):       [phone] (Work cell):  [phone] (Fax):           [phone]   I, Yevette Edwards, am acting as a scribe for Dr. Sullivan Lone.   .I have reviewed the above documentation for accuracy and completeness, and I agree with the above. Brunetta Genera MD

## 2020-07-07 ENCOUNTER — Encounter: Payer: Self-pay | Admitting: Oncology

## 2020-07-10 ENCOUNTER — Telehealth: Payer: Self-pay | Admitting: Hematology

## 2020-07-10 NOTE — Telephone Encounter (Signed)
Called pt per 10/14 sch msg- no answer. Left message for patient to call back to reschedule appt.

## 2020-07-25 ENCOUNTER — Other Ambulatory Visit: Payer: Self-pay | Admitting: Hematology

## 2020-07-27 ENCOUNTER — Other Ambulatory Visit: Payer: Self-pay | Admitting: Hematology

## 2020-07-27 MED FILL — ACYCLOVIR 200 MG CAP: 200 | 30 days supply | Qty: 120 | Fill #0

## 2020-08-31 ENCOUNTER — Other Ambulatory Visit: Payer: Self-pay | Admitting: *Deleted

## 2020-08-31 DIAGNOSIS — C8307 Small cell B-cell lymphoma, spleen: Secondary | ICD-10-CM

## 2020-09-02 ENCOUNTER — Inpatient Hospital Stay: Payer: Medicare Other

## 2020-09-02 ENCOUNTER — Inpatient Hospital Stay (HOSPITAL_BASED_OUTPATIENT_CLINIC_OR_DEPARTMENT_OTHER): Payer: Medicare Other | Admitting: Hematology

## 2020-09-02 ENCOUNTER — Other Ambulatory Visit: Payer: Self-pay | Admitting: Medical

## 2020-09-02 ENCOUNTER — Other Ambulatory Visit: Payer: Self-pay

## 2020-09-02 ENCOUNTER — Inpatient Hospital Stay: Payer: Medicare Other | Attending: Hematology

## 2020-09-02 VITALS — BP 126/73 | HR 65 | Temp 97.9°F | Resp 13 | Ht 66.0 in | Wt 161.6 lb

## 2020-09-02 VITALS — BP 100/53 | HR 67 | Temp 98.1°F | Resp 20

## 2020-09-02 DIAGNOSIS — Z801 Family history of malignant neoplasm of trachea, bronchus and lung: Secondary | ICD-10-CM | POA: Diagnosis not present

## 2020-09-02 DIAGNOSIS — C8307 Small cell B-cell lymphoma, spleen: Secondary | ICD-10-CM

## 2020-09-02 DIAGNOSIS — C884 Extranodal marginal zone B-cell lymphoma of mucosa-associated lymphoid tissue [MALT-lymphoma]: Secondary | ICD-10-CM | POA: Insufficient documentation

## 2020-09-02 DIAGNOSIS — Z79899 Other long term (current) drug therapy: Secondary | ICD-10-CM | POA: Insufficient documentation

## 2020-09-02 DIAGNOSIS — K460 Unspecified abdominal hernia with obstruction, without gangrene: Secondary | ICD-10-CM | POA: Diagnosis not present

## 2020-09-02 DIAGNOSIS — Z5111 Encounter for antineoplastic chemotherapy: Secondary | ICD-10-CM | POA: Insufficient documentation

## 2020-09-02 DIAGNOSIS — R112 Nausea with vomiting, unspecified: Secondary | ICD-10-CM

## 2020-09-02 DIAGNOSIS — Z7189 Other specified counseling: Secondary | ICD-10-CM

## 2020-09-02 LAB — CBC WITH DIFFERENTIAL (CANCER CENTER ONLY)
Abs Immature Granulocytes: 0 10*3/uL (ref 0.00–0.07)
Basophils Absolute: 0.1 10*3/uL (ref 0.0–0.1)
Basophils Relative: 1 %
Eosinophils Absolute: 0.9 10*3/uL — ABNORMAL HIGH (ref 0.0–0.5)
Eosinophils Relative: 6 %
HCT: 37 % (ref 36.0–46.0)
Hemoglobin: 12.6 g/dL (ref 12.0–15.0)
Lymphocytes Relative: 58 %
Lymphs Abs: 8.5 10*3/uL — ABNORMAL HIGH (ref 0.7–4.0)
MCH: 31 pg (ref 26.0–34.0)
MCHC: 34.1 g/dL (ref 30.0–36.0)
MCV: 90.9 fL (ref 80.0–100.0)
Monocytes Absolute: 1.2 10*3/uL — ABNORMAL HIGH (ref 0.1–1.0)
Monocytes Relative: 8 %
Neutro Abs: 4 10*3/uL (ref 1.7–7.7)
Neutrophils Relative %: 27 %
Platelet Count: 319 10*3/uL (ref 150–400)
RBC: 4.07 MIL/uL (ref 3.87–5.11)
RDW: 14.3 % (ref 11.5–15.5)
WBC Count: 14.7 10*3/uL — ABNORMAL HIGH (ref 4.0–10.5)
nRBC: 0 % (ref 0.0–0.2)

## 2020-09-02 LAB — CMP (CANCER CENTER ONLY)
ALT: 12 U/L (ref 0–44)
AST: 16 U/L (ref 15–41)
Albumin: 3.5 g/dL (ref 3.5–5.0)
Alkaline Phosphatase: 56 U/L (ref 38–126)
Anion gap: 8 (ref 5–15)
BUN: 11 mg/dL (ref 8–23)
CO2: 24 mmol/L (ref 22–32)
Calcium: 9 mg/dL (ref 8.9–10.3)
Chloride: 109 mmol/L (ref 98–111)
Creatinine: 0.72 mg/dL (ref 0.44–1.00)
GFR, Estimated: >60 mL/min (ref >60)
Glucose, Bld: 85 mg/dL (ref 70–99)
Potassium: 3.9 mmol/L (ref 3.5–5.1)
Sodium: 141 mmol/L (ref 135–145)
Total Bilirubin: 1 mg/dL (ref 0.3–1.2)
Total Protein: 7.1 g/dL (ref 6.5–8.1)

## 2020-09-02 LAB — URIC ACID: Uric Acid, Serum: 6.6 mg/dL (ref 2.5–7.1)

## 2020-09-02 MED ORDER — DIPHENHYDRAMINE HCL 50 MG/ML IJ SOLN
50.0000 mg | Freq: Once | INTRAMUSCULAR | Status: AC
  Administered 2020-09-02: 50 mg via INTRAVENOUS

## 2020-09-02 MED ORDER — MORPHINE SULFATE 2 MG/ML IJ SOLN
2.0000 mg | Freq: Once | INTRAMUSCULAR | Status: AC
  Administered 2020-09-02: 2 mg via INTRAVENOUS
  Filled 2020-09-02: qty 1

## 2020-09-02 MED ORDER — ONDANSETRON HCL 4 MG/2ML IJ SOLN
INTRAMUSCULAR | Status: AC
  Filled 2020-09-02: qty 4

## 2020-09-02 MED ORDER — PROCHLORPERAZINE MALEATE 10 MG PO TABS
ORAL_TABLET | ORAL | Status: AC
  Filled 2020-09-02: qty 1

## 2020-09-02 MED ORDER — METHYLPREDNISOLONE SODIUM SUCC 125 MG IJ SOLR
125.0000 mg | Freq: Once | INTRAMUSCULAR | Status: AC
  Administered 2020-09-02: 125 mg via INTRAVENOUS

## 2020-09-02 MED ORDER — ACETAMINOPHEN 500 MG PO TABS
1000.0000 mg | ORAL_TABLET | Freq: Once | ORAL | Status: AC
  Administered 2020-09-02: 1000 mg via ORAL

## 2020-09-02 MED ORDER — MORPHINE SULFATE 4 MG/ML IJ SOLN
2.0000 mg | Freq: Once | INTRAMUSCULAR | Status: DC
  Filled 2020-09-02: qty 1

## 2020-09-02 MED ORDER — METHYLPREDNISOLONE SODIUM SUCC 125 MG IJ SOLR
INTRAMUSCULAR | Status: AC
  Filled 2020-09-02: qty 2

## 2020-09-02 MED ORDER — LORAZEPAM 1 MG PO TABS
ORAL_TABLET | ORAL | Status: AC
  Filled 2020-09-02: qty 1

## 2020-09-02 MED ORDER — ACETAMINOPHEN 500 MG PO TABS
ORAL_TABLET | ORAL | Status: AC
  Filled 2020-09-02: qty 2

## 2020-09-02 MED ORDER — MORPHINE SULFATE (PF) 2 MG/ML IV SOLN
INTRAVENOUS | Status: AC
  Filled 2020-09-02: qty 1

## 2020-09-02 MED ORDER — MONTELUKAST SODIUM 10 MG PO TABS
ORAL_TABLET | ORAL | Status: AC
  Filled 2020-09-02: qty 1

## 2020-09-02 MED ORDER — MONTELUKAST SODIUM 10 MG PO TABS
10.0000 mg | ORAL_TABLET | Freq: Once | ORAL | Status: AC
  Administered 2020-09-02: 10 mg via ORAL

## 2020-09-02 MED ORDER — SODIUM CHLORIDE 0.9 % IV SOLN
Freq: Once | INTRAVENOUS | Status: AC
  Filled 2020-09-02: qty 250

## 2020-09-02 MED ORDER — SODIUM CHLORIDE 0.9 % IV SOLN
375.0000 mg/m2 | Freq: Once | INTRAVENOUS | Status: AC
  Administered 2020-09-02: 700 mg via INTRAVENOUS
  Filled 2020-09-02: qty 50

## 2020-09-02 MED ORDER — LORAZEPAM 1 MG PO TABS
0.5000 mg | ORAL_TABLET | Freq: Once | ORAL | Status: AC
  Administered 2020-09-02: 0.5 mg via ORAL

## 2020-09-02 MED ORDER — PROCHLORPERAZINE MALEATE 10 MG PO TABS
10.0000 mg | ORAL_TABLET | Freq: Once | ORAL | Status: AC
  Administered 2020-09-02: 10 mg via ORAL

## 2020-09-02 MED ORDER — DIPHENHYDRAMINE HCL 50 MG/ML IJ SOLN
INTRAMUSCULAR | Status: AC
  Filled 2020-09-02: qty 1

## 2020-09-02 MED ORDER — FAMOTIDINE IN NACL 20-0.9 MG/50ML-% IV SOLN
20.0000 mg | Freq: Once | INTRAVENOUS | Status: AC
  Administered 2020-09-02: 20 mg via INTRAVENOUS

## 2020-09-02 MED ORDER — FAMOTIDINE IN NACL 20-0.9 MG/50ML-% IV SOLN
INTRAVENOUS | Status: AC
  Filled 2020-09-02: qty 50

## 2020-09-02 MED ORDER — ONDANSETRON HCL 4 MG/2ML IJ SOLN
8.0000 mg | Freq: Once | INTRAMUSCULAR | Status: AC
  Administered 2020-09-02: 8 mg via INTRAVENOUS

## 2020-09-02 NOTE — Patient Instructions (Signed)
[facility] °Discharge Instructions for Patients Receiving Chemotherapy ° °Today you received the following chemotherapy agents: Rituximab ° °To help prevent nausea and vomiting after your treatment, we encourage you to take your nausea medication as directed.  °  °If you develop nausea and vomiting that is not controlled by your nausea medication, call the clinic.  ° °BELOW ARE SYMPTOMS THAT SHOULD BE REPORTED IMMEDIATELY: °· *FEVER GREATER THAN 100.5 F °· *CHILLS WITH OR WITHOUT FEVER °· NAUSEA AND VOMITING THAT IS NOT CONTROLLED WITH YOUR NAUSEA MEDICATION °· *UNUSUAL SHORTNESS OF BREATH °· *UNUSUAL BRUISING OR BLEEDING °· TENDERNESS IN MOUTH AND THROAT WITH OR WITHOUT PRESENCE OF ULCERS °· *URINARY PROBLEMS °· *BOWEL PROBLEMS °· UNUSUAL RASH °Items with * indicate a potential emergency and should be followed up as soon as possible. ° °Feel free to call the clinic should you have any questions or concerns. The clinic phone number is [phone]. ° °Please show the CHEMO ALERT CARD at check-in to the Emergency Department and triage nurse. ° ° °

## 2020-09-02 NOTE — Progress Notes (Signed)
HEMATOLOGY/ONCOLOGY FOLLOW UP NOTE  Date of Service:  09/02/20     Patient Care Team: Alroy Dust, L.Marlou Sa, MD as PCP - General (Family Medicine)  CHIEF COMPLAINTS F/u for continued management of SMZL   HISTORY OF PRESENTING ILLNESS:   Michelle Cohen is a wonderful 69 y.o. female who has been referred to [place] from [facility] for evaluation and management of B-cell lymphoma. She presents to her appointment today with her partner of 21 years. She states that she moved to the area ~2 years ago secondary to her sister's diagnosis of lung cancer (unfortunately, now passed) and taking care of her mother's Alzheimer's. Generally, prior to this issue she has been very healthy and without chronic medical conditions and not on chronic medical therapies/treatments.   Initially, the patient was admitted to [facility] for abdominal pain and significant hernia. She reports that she was visiting in [place] and while flying there she had an acute onset of sweats and left lower quadrant pain. She reports that her hernia was not present at that time, but as she continued to fly her hernia had become more noticeable and enlarged in her lower abdomen. Additionally, her partner reports that her episode of hydrosis was very significant, stating that it was pooling below her. On their arrival to [facility] on 06/21/17, a CT A/P was performed which showed an incarcerated hernia. Of note, her CT showed massive splenomegaly at 32cm in greatest dimension. Manual reduction of her hernia was attempted while in the ED but was unsuccessful. Pre-operative notes state that following intubation manual reduction was performed successfully and hernia was repaired laparoscopically. CT CAP was performed as well which was without the presence of additional surrounding LAD. She did have flow cytometry performed during her admission as well which showed B-cell lymphocytes which were monoclonal. A bone marrow biopsy was  also examined which showed that it was markedly hypercellular (~95%) with prominent interstitial infiltrate of small lymphocytes.   Prior to her diagnosis, she mostly felt typically well overall. She states that she did suffer from insomnia which she related to over-thinking at the time regarding her familial situation. She did experience some fatigue and weight loss with this as well. Her partner reports that she noticed her weight loss approximately 68moago, which she quantifies at approximately 30lbs since onset. Towards the end of this six month periods and just prior to the appearance of her hernia the patient began to notice some bothersome abdominal distension. She also noticed some significant fatigue and she would nap for longer periods following her daily workouts. Additionally, her partner would notice that she would have issues with breathing and this appeared irregular and more labored, especially at night while laying down. She also mentions that she did experience intermittent night sweats throughout this six month period as well. She denies lightheadedness, dizziness, abdominal pain, back pain, fever, chills, abnormal bruising/bleeding, bloody stools, epistaxis, hematuria, gingival bleeding, or other bleeding issues within this period. She has not received any blood transfusions and has not previously had a tattoo; she is unconcerned with prior HepC exposure.   Since her hernia surgery, she reports that she feels great symptomatically overall and much improved. She does have some issues with abdominal bloating and discomfort, but this has been manageable. No overt abdominal pain.   Throughout all of this, she has attempted to try to remain within good spirits. Losing her sister recently has been hard, but she has been working through it with  her family; however, her partner voices that they do not have strong social support in this area as most of their support is in [place] where they  previously lived. She has been working through her sister's death with grief counseling which has been helping her.    She has never previously smoked, but she reports some moderate second-hand smoke through her father at a younger age. Her father did die of a PE, but she is unaware of any other diagnoses of clotting disorders or cancers within her family. No h/o alcohol abuse, chemical/radiation exposures. She worked previously in Industrial/product designer.   PREVIOUS TREATMENT:  Rituximab weekly x4 beginning on 07/21/17-08/11/17 Received additional Rituxan weekly x 4 doses Rituximab  Weekly for 4 weeks starting 10/05/17 - completed 10/2017. S/p splenectomy at [place] with Dr Shon Hough.  CURRENT TREATMENT:  Bimonthly Rituxan  INTERIM HISTORY:  Michelle Cohen returns today for scheduled follow up of her SMZL. We are joined today by her wife, Michelle Cohen. The patient's last visit with [place] was on 07/06/2020. The pt reports that she is doing well overall.  The pt reports that she has been experiencing night sweats for the last two weeks. Most of the perspiration occurs in the chest. They are not drenching, but she does change her clothes in the night. Pt admits some anxiety for a few days prior to our visits due to concern for disease progression. She has received all of her COVID19 vaccines and her annual flu vaccine. She believes that she has also gotten the Shingles vaccine. She notes significant abdominal soreness after being active, which she feels caused by scar tissue from previous surgeries.   Lab results today (09/02/20) of CBC w/diff and CMP is as follows: all values are WNL except for WBC at 14.7K. 09/02/2020 Uric acid at 6.6  On review of systems, pt reports anxiety, night sweats and denies fevers, chills, leg swelling, new fatigue and any other symptoms.   MEDICAL HISTORY:  Past Medical History:  Diagnosis Date  . B-cell lymphoma ([place]) 06/2017     SURGICAL HISTORY: Past  Surgical History:  Procedure Laterality Date  . left inguinal hernia repair Left   . SPLENECTOMY, TOTAL  02/13/2018    SOCIAL HISTORY: Social History   Socioeconomic History  . Marital status: Single    Spouse name: Not on file  . Number of children: Not on file  . Years of education: Not on file  . Highest education level: Not on file  Occupational History  . Not on file  Tobacco Use  . Smoking status: Never Smoker  . Smokeless tobacco: Never Used  Vaping Use  . Vaping Use: Never used  Substance and Sexual Activity  . Alcohol use: Yes    Comment: glass of wine occasionally  . Drug use: No  . Sexual activity: Not on file  Other Topics Concern  . Not on file  Social History Narrative  . Not on file   Social Determinants of Health   Financial Resource Strain:   . Difficulty of Paying Living Expenses: Not on file  Food Insecurity:   . Worried About Charity fundraiser in the Last Year: Not on file  . Ran Out of Food in the Last Year: Not on file  Transportation Needs:   . Lack of Transportation (Medical): Not on file  . Lack of Transportation (Non-Medical): Not on file  Physical Activity:   . Days of Exercise per Week: Not on file  .  Minutes of Exercise per Session: Not on file  Stress:   . Feeling of Stress : Not on file  Social Connections:   . Frequency of Communication with Friends and Family: Not on file  . Frequency of Social Gatherings with Friends and Family: Not on file  . Attends Religious Services: Not on file  . Active Member of Clubs or Organizations: Not on file  . Attends Archivist Meetings: Not on file  . Marital Status: Not on file  Intimate Partner Violence:   . Fear of Current or Ex-Partner: Not on file  . Emotionally Abused: Not on file  . Physically Abused: Not on file  . Sexually Abused: Not on file    FAMILY HISTORY: Sister - lung cancer Mother -Alzheimers dementia  ALLERGIES:  is allergic to allopurinol and  tape.  MEDICATIONS:  Current Outpatient Medications  Medication Sig Dispense Refill  . acetaminophen (TYLENOL) 500 MG tablet Take 500 mg by mouth every 8 (eight) hours as needed for moderate pain.     Marland Kitchen acyclovir (ZOVIRAX) 200 MG capsule TAKE 2 CAPSULES BY MOUTH TWICE DAILY 120 capsule 1  . albuterol (PROVENTIL HFA;VENTOLIN HFA) 108 (90 Base) MCG/ACT inhaler Inhale 1-2 puffs into the lungs every 6 (six) hours as needed for wheezing or shortness of breath. 1 Inhaler 0  . aspirin 81 MG EC tablet Take by mouth.    Marland Kitchen augmented betamethasone dipropionate (DIPROLENE-AF) 0.05 % cream SMARTSIG:Sparingly Topical Twice Daily PRN    . benzonatate (TESSALON) 200 MG capsule Take 1 capsule (200 mg total) by mouth 3 (three) times daily as needed for cough. 30 capsule 0  . chlorhexidine (PERIDEX) 0.12 % solution SWISH WITH 15 MLS IN MOUTH OR THROAT THEN SPIT --USE TWICE DAILY AS DIRECTED 473 mL 1  . chlorpheniramine-HYDROcodone (TUSSIONEX PENNKINETIC ER) 10-8 MG/5ML SUER Take 5 mLs by mouth every 12 (twelve) hours as needed for cough. 60 mL 0  . Cyanocobalamin (VITAMIN B 12 PO) Take by mouth.    . Diphenhyd-Hydrocort-Nystatin (FIRST-DUKES MOUTHWASH MT) Take by mouth.    . docusate sodium (COLACE) 100 MG capsule Take 100 mg by mouth 2 (two) times daily.    Marland Kitchen docusate sodium (COLACE) 50 MG capsule Take by mouth.    . erythromycin ophthalmic ointment SMARTSIG:In Eye(s)    . fluocinonide (LIDEX) 0.05 % external solution SMARTSIG:1 Milliliter(s) Topical Every Night PRN    . fluticasone (FLONASE) 50 MCG/ACT nasal spray Place 2 sprays into both nostrils daily. 16 g 0  . folic acid (FOLVITE) 1 MG tablet Take by mouth.    . hydrOXYzine (ATARAX/VISTARIL) 25 MG tablet Take 1-2 tablets (25-50 mg total) by mouth every 4 (four) hours as needed. 30 tablet 0  . hydrOXYzine (VISTARIL) 25 MG capsule Take by mouth.    Marland Kitchen ketoconazole (NIZORAL) 2 % shampoo Apply 1 application topically 2 (two) times a week. 120 mL 1  .  Loratadine 10 MG CAPS Take 10 mg by mouth daily.    . magic mouthwash w/lidocaine SOLN Take 5 mLs by mouth 4 (four) times daily as needed for mouth pain. Swish and spit 1 Part viscous lidocaine 2% 1 Part Maalox (do not substitute Kaopectate) 1 Part diphenhydramine 12.5 mg per 5 ml elixir. 120 mL 2  . magic mouthwash w/lidocaine SOLN Take 5 mLs by mouth 4 (four) times daily as needed for mouth pain. Swish and spit 120 mL 0  . moxifloxacin (VIGAMOX) 0.5 % ophthalmic solution     . oxyCODONE (OXY  IR/ROXICODONE) 5 MG immediate release tablet Take by mouth.    . Prednisolon-Gatiflox-Bromfenac 1-0.5-0.075 % SUSP Place 1 drop into the right eye 4 (four) times daily.    . prednisoLONE acetate (PRED FORTE) 1 % ophthalmic suspension     . predniSONE (DELTASONE) 20 MG tablet Take 2 tablets (40 mg total) by mouth daily with breakfast. Starting 5 days prior to 1st dose of Rituxan 60 tablet 0  . senna (SENOKOT) 8.6 MG TABS tablet Take 1 tablet by mouth daily as needed for mild constipation.     Marland Kitchen Spacer/Aero-Holding Chambers (AEROCHAMBER PLUS) inhaler Use as instructed 1 each 2  . triamcinolone cream (KENALOG) 0.1 % Apply 1 application topically 2 (two) times daily. 45 g 0   No current facility-administered medications for this visit.    REVIEW OF SYSTEMS:   A 10+ POINT REVIEW OF SYSTEMS WAS OBTAINED including neurology, dermatology, psychiatry, cardiac, respiratory, lymph, extremities, GI, GU, Musculoskeletal, constitutional, breasts, reproductive, HEENT.  All pertinent positives are noted in the HPI.  All others are negative.   PHYSICAL EXAMINATION:  ECOG PERFORMANCE STATUS: 1 BP 126/73 (BP Location: Left Arm, Patient Position: Sitting)   Pulse 65   Temp 97.9 F (36.6 C) (Tympanic)   Resp 13   Ht '5\' 6"'  (1.676 m)   Wt 161 lb 9.6 oz (73.3 kg)   SpO2 100%   BMI 26.08 kg/m    GENERAL:alert, in no acute distress and comfortable SKIN: no acute rashes, no significant lesions EYES: conjunctiva are  pink and non-injected, sclera anicteric OROPHARYNX: MMM, no exudates, no oropharyngeal erythema or ulceration NECK: supple, no JVD LYMPH:  no palpable lymphadenopathy in the cervical, axillary or inguinal regions LUNGS: clear to auscultation b/l with normal respiratory effort HEART: regular rate & rhythm ABDOMEN:  normoactive bowel sounds , non tender, not distended. No palpable hepatosplenomegaly.  Extremity: no pedal edema PSYCH: alert & oriented x 3 with fluent speech NEURO: no focal motor/sensory deficits  LABORATORY DATA:   I have reviewed the data as listed   CBC Latest Ref Rng & Units 09/02/2020 07/06/2020 05/06/2020  WBC 4.0 - 10.5 K/uL 14.7(H) 32.3(H) 78.0(HH)  Hemoglobin 12.0 - 15.0 g/dL 12.6 11.6(L) 11.5(L)  Hematocrit 36 - 46 % 37.0 35.3(L) 35.1(L)  Platelets 150 - 400 K/uL 319 323 263  ANC 4.1k . CBC    Component Value Date/Time   WBC 14.7 (H) 09/02/2020 0850   WBC 29.5 (H) 03/04/2020 0841   RBC 4.07 09/02/2020 0850   HGB 12.6 09/02/2020 0850   HGB 9.1 (L) 09/14/2017 0835   HCT 37.0 09/02/2020 0850   HCT 35.2 03/05/2018 1331   HCT 29.0 (L) 09/14/2017 0835   PLT 319 09/02/2020 0850   PLT 46 (L) 09/14/2017 0835   MCV 90.9 09/02/2020 0850   MCV 92.1 09/14/2017 0835   MCH 31.0 09/02/2020 0850   MCHC 34.1 09/02/2020 0850   RDW 14.3 09/02/2020 0850   RDW 18.2 (H) 09/14/2017 0835   LYMPHSABS 8.5 (H) 09/02/2020 0850   LYMPHSABS 2.0 09/14/2017 0835   MONOABS 1.2 (H) 09/02/2020 0850   MONOABS 0.3 09/14/2017 0835   EOSABS 0.9 (H) 09/02/2020 0850   EOSABS 0.0 09/14/2017 0835   BASOSABS 0.1 09/02/2020 0850   BASOSABS 0.0 09/14/2017 0835    . CMP Latest Ref Rng & Units 09/02/2020 07/06/2020 05/06/2020  Glucose 70 - 99 mg/dL 85 90 94  BUN 8 - 23 mg/dL '11 14 13  ' Creatinine 0.44 - 1.00 mg/dL 0.72 0.75 0.74  Sodium 135 - 145 mmol/L 141 138 141  Potassium 3.5 - 5.1 mmol/L 3.9 3.9 4.0  Chloride 98 - 111 mmol/L 109 108 108  CO2 22 - 32 mmol/L 24 23 21(L)  Calcium 8.9  - 10.3 mg/dL 9.0 8.9 9.5  Total Protein 6.5 - 8.1 g/dL 7.1 7.2 7.3  Total Bilirubin 0.3 - 1.2 mg/dL 1.0 0.9 1.0  Alkaline Phos 38 - 126 U/L 56 62 63  AST 15 - 41 U/L '16 16 22  ' ALT 0 - 44 U/L '12 14 13   ' Component     Latest Ref Rng & Units 07/13/2017  LDH     125 - 245 U/L 232  Hep C Virus Ab     0.0 - 0.9 s/co ratio <0.1  HIV Screen 4th Generation wRfx     Non Reactive Non Reactive  Hep B Core Ab, Tot     Negative Negative  Hepatitis B Surface Ag     Negative Negative   Component     Latest Ref Rng & Units 07/28/2017  LDH     125 - 245 U/L 200  Uric Acid, Serum     2.6 - 7.4 mg/dl 6.0  Phosphorus     2.5 - 4.5 mg/dL 3.8   03/05/18 Pathology Report Splenectomy:      RADIOGRAPHIC STUDIES: I have personally reviewed the radiological images as listed and agreed with the findings in the report. No results found.   ASSESSMENT & PLAN:   Michelle Cohen is a wonderful 69 y.o. 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  Her clinical presentation is also representative of this and that this may have been present over months to possibly years.  -With her pattern of involvement and results thus far it is likely that she has splenic marginal zone type lymphoma.  -PET scan on 07/25/2017 with results showing: Massive splenomegaly, splenic volume 5770 cubic cm, with homogeneous low grade activity throughout the spleen equal to that of the physiologic activity in the liver, and above the background blood pool activity in the mediastinum. No pathologic adenopathy identified. No bony involvement noted.   -CT  A/p on 08/23/17 with results of: Marked splenomegaly without substantial interval change in splenic size/volume. Borderline lymphadenopathy in the hepatoduodenal ligament. Aortic Atherosclerosis. I discussed this with the patient in great detail.  -CT A/p on 09/15/17 shows significant splenomegaly shows minimal smaller change in size, with no evidence of bowel obstruction.  -12/31/2019 CT C/A/P ([id number]) ([id number]) revealed "1. 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 Although there are scattered tiny lymph nodes in the chest, no lymphadenopathy by size criteria in the thorax or pelvis. 2. Hepatomegaly. 3. Prior splenectomy. 4. Enlargement of the pulmonary outflow tract and main pulmonary arteries suggests pulmonary arterial hypertension. 5.  Aortic Atherosclerois (ICD10-170.0)."   2. Pancytopenia s/p thrombocytopenia and anemia  Due to SMZL and hypersplenism Now resolved  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 Had to complete Rituxan at lower rate. Patient would not be a candidate for rapid infusion  protocol and would need to keep the next infusion at the tolerated rate and not rate escalate. Plan  Completed with extensive pre-medications.  4. Oral mucositis/HSV outbreak - resolved -continue with acyclovir as needed   PLAN: -Discussed pt labwork today, 09/02/20; WBC & Hgb continue to improve, PLT are nml, blood chemistries are nml, Uric acid is WNL. -Advised pt that her symptoms are not correlating with her blood counts. Symptoms do not appear to be caused by disease progression.   -No lab or clinical evidence of SMZL progression at this time.  -No prohibitive toxicities from continuing Rituxan every two months at this time. -Would continue maintenance Rituxan for up to 3 years, unless progression or intolerance. Would also  discontinue if WBC normalize.   -Advised pt that we can always switch to Bendamustine/Rituxan to place pt into complete remission if she so chooses.  -Recommend pt f/u for confirmation that she has received Shingles vaccine.  -Will see back in 2 months with labs    FOLLOW UP: RTC as per scheduled appointments for 11/04/2019   The total time spent in the appt was 30 minutes and more than 50% was on counseling and direct patient cares, ordering and management of immunotherapy  All of the patient's questions were answered with apparent satisfaction. The patient knows to call the clinic with any problems, questions or concerns.   Sullivan Lone MD [place] AAHIVMS [facility] Hematology/Oncology Physician [facility]  (Office):       [phone] (Work cell):  [phone] (Fax):           [phone]   I, Yevette Edwards, am acting as a scribe for Dr. Sullivan Lone.   .I have reviewed the above documentation for accuracy and completeness, and I agree with the above. Brunetta Genera MD

## 2020-09-14 ENCOUNTER — Encounter: Payer: Self-pay | Admitting: Hematology

## 2020-09-16 ENCOUNTER — Other Ambulatory Visit: Payer: Self-pay | Admitting: Hematology

## 2020-09-16 MED FILL — ACYCLOVIR 200 MG CAP: 200 | 30 days supply | Qty: 120 | Fill #1

## 2020-09-16 NOTE — Telephone Encounter (Signed)
Patient request refill

## 2020-09-17 ENCOUNTER — Other Ambulatory Visit: Payer: Self-pay | Admitting: Hematology

## 2020-09-17 MED FILL — CHLORHEXIDINE 0.12% RINSE: 0.12 | 16 days supply | Qty: 473 | Fill #0

## 2020-09-28 ENCOUNTER — Telehealth: Payer: Self-pay | Admitting: Hematology

## 2020-09-28 NOTE — Telephone Encounter (Signed)
Release: [id number] Faxed medical records to [facility] @ fax #[phone]

## 2020-10-05 ENCOUNTER — Encounter: Payer: Self-pay | Admitting: Hematology

## 2020-10-29 DIAGNOSIS — H43813 Vitreous degeneration, bilateral: Secondary | ICD-10-CM | POA: Diagnosis not present

## 2020-10-29 DIAGNOSIS — Z961 Presence of intraocular lens: Secondary | ICD-10-CM | POA: Diagnosis not present

## 2020-10-29 DIAGNOSIS — H35412 Lattice degeneration of retina, left eye: Secondary | ICD-10-CM | POA: Diagnosis not present

## 2020-10-29 DIAGNOSIS — H353132 Nonexudative age-related macular degeneration, bilateral, intermediate dry stage: Secondary | ICD-10-CM | POA: Diagnosis not present

## 2020-11-02 NOTE — Progress Notes (Signed)
HEMATOLOGY/ONCOLOGY FOLLOW UP NOTE  Date of Service:  11/02/20     Patient Care Team: Alroy Dust, L.Marlou Sa, MD as PCP - General (Family Medicine)  CHIEF COMPLAINTS F/u for continued management of SMZL   HISTORY OF PRESENTING ILLNESS:   Michelle Cohen is a wonderful 70 y.o. female who has been referred to [place] from [facility] for evaluation and management of B-cell lymphoma. She presents to her appointment today with her partner of 21 years. She states that she moved to the area ~2 years ago secondary to her sister's diagnosis of lung cancer (unfortunately, now passed) and taking care of her mother's Alzheimer's. Generally, prior to this issue she has been very healthy and without chronic medical conditions and not on chronic medical therapies/treatments.   Initially, the patient was admitted to [facility] for abdominal pain and significant hernia. She reports that she was visiting in [place] and while flying there she had an acute onset of sweats and left lower quadrant pain. She reports that her hernia was not present at that time, but as she continued to fly her hernia had become more noticeable and enlarged in her lower abdomen. Additionally, her partner reports that her episode of hydrosis was very significant, stating that it was pooling below her. On their arrival to [facility] on 06/21/17, a CT A/P was performed which showed an incarcerated hernia. Of note, her CT showed massive splenomegaly at 32cm in greatest dimension. Manual reduction of her hernia was attempted while in the ED but was unsuccessful. Pre-operative notes state that following intubation manual reduction was performed successfully and hernia was repaired laparoscopically. CT CAP was performed as well which was without the presence of additional surrounding LAD. She did have flow cytometry performed during her admission as well which showed B-cell lymphocytes which were monoclonal. A bone marrow biopsy was  also examined which showed that it was markedly hypercellular (~95%) with prominent interstitial infiltrate of small lymphocytes.   Prior to her diagnosis, she mostly felt typically well overall. She states that she did suffer from insomnia which she related to over-thinking at the time regarding her familial situation. She did experience some fatigue and weight loss with this as well. Her partner reports that she noticed her weight loss approximately 77moago, which she quantifies at approximately 30lbs since onset. Towards the end of this six month periods and just prior to the appearance of her hernia the patient began to notice some bothersome abdominal distension. She also noticed some significant fatigue and she would nap for longer periods following her daily workouts. Additionally, her partner would notice that she would have issues with breathing and this appeared irregular and more labored, especially at night while laying down. She also mentions that she did experience intermittent night sweats throughout this six month period as well. She denies lightheadedness, dizziness, abdominal pain, back pain, fever, chills, abnormal bruising/bleeding, bloody stools, epistaxis, hematuria, gingival bleeding, or other bleeding issues within this period. She has not received any blood transfusions and has not previously had a tattoo; she is unconcerned with prior HepC exposure.   Since her hernia surgery, she reports that she feels great symptomatically overall and much improved. She does have some issues with abdominal bloating and discomfort, but this has been manageable. No overt abdominal pain.   Throughout all of this, she has attempted to try to remain within good spirits. Losing her sister recently has been hard, but she has been working through it with  her family; however, her partner voices that they do not have strong social support in this area as most of their support is in [place] where they  previously lived. She has been working through her sister's death with grief counseling which has been helping her.    She has never previously smoked, but she reports some moderate second-hand smoke through her father at a younger age. Her father did die of a PE, but she is unaware of any other diagnoses of clotting disorders or cancers within her family. No h/o alcohol abuse, chemical/radiation exposures. She worked previously in Industrial/product designer.   PREVIOUS TREATMENT:  Rituximab weekly x4 beginning on 07/21/17-08/11/17 Received additional Rituxan weekly x 4 doses Rituximab  Weekly for 4 weeks starting 10/05/17 - completed 10/2017. S/p splenectomy at [facility] with Dr Shon Hough.  CURRENT TREATMENT:  Bimonthly Rituxan  INTERIM HISTORY:   Glenn Christo returns today for scheduled follow up of her SMZL. We are joined today by her wife, Caren Griffins. The patient's last visit with [place] was on 09/15/2020. The pt reports that she is doing well overall. She is here for C5 of continued maintenance dosage of Rituxan.  The pt reports no new concerns or symptoms. She is staying active and has reached 5-6 miles daily walking. The pt notes that she still has hot flashes and night sweats during treatments, but notes they have improved outside of treatment times. The pt notes that Areds #2 causes her nausea that is bothersome.   The pt notes that she has been continuing to isolate and protect herself socially from COVID and infections.   Lab results today 11/03/2020 of CBC w/diff and CMP is as follows: all values are WNL except Lymphs Abs of 4.1K. 11/03/2020 LDH of 190.  On review of systems, pt denies hot flashes, fevers, chills, night sweats, back pain, abdominal pain, leg swelling and any other symptoms.  MEDICAL HISTORY:  Past Medical History:  Diagnosis Date  . B-cell lymphoma ([place]) 06/2017     SURGICAL HISTORY: Past Surgical History:  Procedure Laterality Date  . left inguinal  hernia repair Left   . SPLENECTOMY, TOTAL  02/13/2018    SOCIAL HISTORY: Social History   Socioeconomic History  . Marital status: Single    Spouse name: Not on file  . Number of children: Not on file  . Years of education: Not on file  . Highest education level: Not on file  Occupational History  . Not on file  Tobacco Use  . Smoking status: Never Smoker  . Smokeless tobacco: Never Used  Vaping Use  . Vaping Use: Never used  Substance and Sexual Activity  . Alcohol use: Yes    Comment: glass of wine occasionally  . Drug use: No  . Sexual activity: Not on file  Other Topics Concern  . Not on file  Social History Narrative  . Not on file   Social Determinants of Health   Financial Resource Strain: Not on file  Food Insecurity: Not on file  Transportation Needs: Not on file  Physical Activity: Not on file  Stress: Not on file  Social Connections: Not on file  Intimate Partner Violence: Not on file    FAMILY HISTORY: Sister - lung cancer Mother -Alzheimers dementia  ALLERGIES:  is allergic to allopurinol and tape.  MEDICATIONS:  Current Outpatient Medications  Medication Sig Dispense Refill  . acetaminophen (TYLENOL) 500 MG tablet Take 500 mg by mouth every 8 (eight) hours as needed for  moderate pain.     Marland Kitchen acyclovir (ZOVIRAX) 200 MG capsule TAKE 2 CAPSULES BY MOUTH TWICE DAILY 120 capsule 1  . albuterol (PROVENTIL HFA;VENTOLIN HFA) 108 (90 Base) MCG/ACT inhaler Inhale 1-2 puffs into the lungs every 6 (six) hours as needed for wheezing or shortness of breath. 1 Inhaler 0  . aspirin 81 MG EC tablet Take by mouth.    Marland Kitchen augmented betamethasone dipropionate (DIPROLENE-AF) 0.05 % cream SMARTSIG:Sparingly Topical Twice Daily PRN    . benzonatate (TESSALON) 200 MG capsule Take 1 capsule (200 mg total) by mouth 3 (three) times daily as needed for cough. 30 capsule 0  . chlorhexidine (PERIDEX) 0.12 % solution SWISH WITH 15 MLS IN MOUTH OR THROAT THEN SPIT --USE TWICE DAILY  AS DIRECTED 473 mL 1  . chlorpheniramine-HYDROcodone (TUSSIONEX PENNKINETIC ER) 10-8 MG/5ML SUER Take 5 mLs by mouth every 12 (twelve) hours as needed for cough. 60 mL 0  . Cyanocobalamin (VITAMIN B 12 PO) Take by mouth.    . Diphenhyd-Hydrocort-Nystatin (FIRST-DUKES MOUTHWASH MT) Take by mouth.    . docusate sodium (COLACE) 100 MG capsule Take 100 mg by mouth 2 (two) times daily.    Marland Kitchen docusate sodium (COLACE) 50 MG capsule Take by mouth.    . erythromycin ophthalmic ointment SMARTSIG:In Eye(s)    . fluocinonide (LIDEX) 0.05 % external solution SMARTSIG:1 Milliliter(s) Topical Every Night PRN    . fluticasone (FLONASE) 50 MCG/ACT nasal spray Place 2 sprays into both nostrils daily. 16 g 0  . folic acid (FOLVITE) 1 MG tablet Take by mouth.    . hydrOXYzine (ATARAX/VISTARIL) 25 MG tablet Take 1-2 tablets (25-50 mg total) by mouth every 4 (four) hours as needed. 30 tablet 0  . hydrOXYzine (VISTARIL) 25 MG capsule Take by mouth.    Marland Kitchen ketoconazole (NIZORAL) 2 % shampoo Apply 1 application topically 2 (two) times a week. 120 mL 1  . Loratadine 10 MG CAPS Take 10 mg by mouth daily.    . magic mouthwash w/lidocaine SOLN Take 5 mLs by mouth 4 (four) times daily as needed for mouth pain. Swish and spit 1 Part viscous lidocaine 2% 1 Part Maalox (do not substitute Kaopectate) 1 Part diphenhydramine 12.5 mg per 5 ml elixir. 120 mL 2  . magic mouthwash w/lidocaine SOLN Take 5 mLs by mouth 4 (four) times daily as needed for mouth pain. Swish and spit 120 mL 0  . moxifloxacin (VIGAMOX) 0.5 % ophthalmic solution     . oxyCODONE (OXY IR/ROXICODONE) 5 MG immediate release tablet Take by mouth.    . Prednisolon-Gatiflox-Bromfenac 1-0.5-0.075 % SUSP Place 1 drop into the right eye 4 (four) times daily.    . prednisoLONE acetate (PRED FORTE) 1 % ophthalmic suspension     . predniSONE (DELTASONE) 20 MG tablet Take 2 tablets (40 mg total) by mouth daily with breakfast. Starting 5 days prior to 1st dose of Rituxan 60  tablet 0  . senna (SENOKOT) 8.6 MG TABS tablet Take 1 tablet by mouth daily as needed for mild constipation.     Marland Kitchen Spacer/Aero-Holding Chambers (AEROCHAMBER PLUS) inhaler Use as instructed 1 each 2  . triamcinolone cream (KENALOG) 0.1 % Apply 1 application topically 2 (two) times daily. 45 g 0   No current facility-administered medications for this visit.    REVIEW OF SYSTEMS:   10 Point review of Systems was done is negative except as noted above.  PHYSICAL EXAMINATION:  ECOG PERFORMANCE STATUS: 1 BP 117/73 (BP Location: Left Arm, Patient Position:  Sitting)   Pulse 64   Temp 97.8 F (36.6 C) (Tympanic)   Resp 20   Ht '5\' 6"'  (1.676 m)   Wt 166 lb 8 oz (75.5 kg)   SpO2 99%   BMI 26.87 kg/m    GENERAL:alert, in no acute distress and comfortable SKIN: no acute rashes, no significant lesions EYES: conjunctiva are pink and non-injected, sclera anicteric OROPHARYNX: MMM, no exudates, no oropharyngeal erythema or ulceration NECK: supple, no JVD LYMPH:  no palpable lymphadenopathy in the cervical, axillary or inguinal regions LUNGS: clear to auscultation b/l with normal respiratory effort HEART: regular rate & rhythm ABDOMEN:  normoactive bowel sounds , non tender, not distended. Extremity: no pedal edema PSYCH: alert & oriented x 3 with fluent speech NEURO: no focal motor/sensory deficits  LABORATORY DATA:   I have reviewed the data as listed   CBC Latest Ref Rng & Units 11/03/2020 09/02/2020 07/06/2020  WBC 4.0 - 10.5 K/uL 8.5 14.7(H) 32.3(H)  Hemoglobin 12.0 - 15.0 g/dL 14.3 12.6 11.6(L)  Hematocrit 36.0 - 46.0 % 42.7 37.0 35.3(L)  Platelets 150 - 400 K/uL 372 319 323  ANC 4.1k . CBC    Component Value Date/Time   WBC 8.5 11/03/2020 0850   RBC 4.66 11/03/2020 0850   HGB 14.3 11/03/2020 0850   HGB 12.6 09/02/2020 0850   HGB 9.1 (L) 09/14/2017 0835   HCT 42.7 11/03/2020 0850   HCT 35.2 03/05/2018 1331   HCT 29.0 (L) 09/14/2017 0835   PLT 372 11/03/2020 0850   PLT 319  09/02/2020 0850   PLT 46 (L) 09/14/2017 0835   MCV 91.6 11/03/2020 0850   MCV 92.1 09/14/2017 0835   MCH 30.7 11/03/2020 0850   MCHC 33.5 11/03/2020 0850   RDW 13.8 11/03/2020 0850   RDW 18.2 (H) 09/14/2017 0835   LYMPHSABS 4.1 (H) 11/03/2020 0850   LYMPHSABS 2.0 09/14/2017 0835   MONOABS 0.9 11/03/2020 0850   MONOABS 0.3 09/14/2017 0835   EOSABS 0.3 11/03/2020 0850   EOSABS 0.0 09/14/2017 0835   BASOSABS 0.1 11/03/2020 0850   BASOSABS 0.0 09/14/2017 0835    . CMP Latest Ref Rng & Units 09/02/2020 07/06/2020 05/06/2020  Glucose 70 - 99 mg/dL 85 90 94  BUN 8 - 23 mg/dL '11 14 13  ' Creatinine 0.44 - 1.00 mg/dL 0.72 0.75 0.74  Sodium 135 - 145 mmol/L 141 138 141  Potassium 3.5 - 5.1 mmol/L 3.9 3.9 4.0  Chloride 98 - 111 mmol/L 109 108 108  CO2 22 - 32 mmol/L 24 23 21(L)  Calcium 8.9 - 10.3 mg/dL 9.0 8.9 9.5  Total Protein 6.5 - 8.1 g/dL 7.1 7.2 7.3  Total Bilirubin 0.3 - 1.2 mg/dL 1.0 0.9 1.0  Alkaline Phos 38 - 126 U/L 56 62 63  AST 15 - 41 U/L '16 16 22  ' ALT 0 - 44 U/L '12 14 13   ' Component     Latest Ref Rng & Units 07/13/2017  LDH     125 - 245 U/L 232  Hep C Virus Ab     0.0 - 0.9 s/co ratio <0.1  HIV Screen 4th Generation wRfx     Non Reactive Non Reactive  Hep B Core Ab, Tot     Negative Negative  Hepatitis B Surface Ag     Negative Negative   Component     Latest Ref Rng & Units 07/28/2017  LDH     125 - 245 U/L 200  Uric Acid, Serum  2.6 - 7.4 mg/dl 6.0  Phosphorus     2.5 - 4.5 mg/dL 3.8   03/05/18 Pathology Report Splenectomy:      RADIOGRAPHIC STUDIES: I have personally reviewed the radiological images as listed and agreed with the findings in the report. No results found.   ASSESSMENT & PLAN:   Shuree Brossart is a wonderful 70 y.o. caucasian female who presents to our clinic to discuss ongoing management of the following:   1. 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  Her clinical presentation is also representative of this and that this may have been present over months to possibly years.  -With her pattern of involvement and results thus far it is likely that she has splenic marginal zone type lymphoma.  -PET scan on 07/25/2017 with results showing: Massive splenomegaly, splenic volume 5770 cubic cm, with homogeneous low grade activity throughout the spleen equal to that of the physiologic activity in the liver, and above the background blood pool activity in the mediastinum. No pathologic adenopathy identified. No bony involvement noted.   -CT A/p on 08/23/17 with results of: Marked splenomegaly without substantial interval change in splenic size/volume. Borderline lymphadenopathy in the hepatoduodenal ligament. Aortic Atherosclerosis. I discussed this with the patient in great detail.  -CT A/p on 09/15/17 shows significant splenomegaly shows minimal smaller change in size, with no evidence of bowel obstruction.  -12/31/2019 CT C/A/P ([id number]) ([id number]) revealed "1. 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 Although there are scattered tiny lymph nodes in the chest, no lymphadenopathy by size criteria in the thorax or pelvis. 2. Hepatomegaly. 3. Prior splenectomy. 4. Enlargement of the pulmonary outflow tract and main pulmonary arteries suggests pulmonary arterial hypertension. 5.  Aortic Atherosclerois (ICD10-170.0)."   2. Pancytopenia s/p thrombocytopenia and anemia  Due to SMZL and hypersplenism Now resolved  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 Had to complete Rituxan at lower  rate. Patient would not be a candidate for rapid infusion protocol and would need to keep the next infusion at the tolerated rate and not rate escalate. Plan  Completed with extensive pre-medications.  4. Oral mucositis/HSV outbreak - resolved -continue with acyclovir as needed   PLAN: -Discussed pt labwork today, 11/03/20; blood chemistries normal, WBC have normalized. Hgb improving up to 14.3. -Advised pt that if labs remain normal, we will complete 1-2 more cycles Rituxan prior to stopping and monitoring.  -Advised pt to take Areds #2 in the middle of her meal, as opposed to after to see if this aids in nausea. Take [phone] steps following meals to help body digest it.  -Advised pt that Hgb improves and bone marrow works better due to cleaning out of lymphocytes following treatment. -Recommended pt continue to stay physically active and suggested gardening as a method for therapy and activity. -No lab or clinical evidence of SMZL progression at this time.  -No prohibitive toxicities from continuing Rituxan every two months at this time. -Would continue maintenance Rituxan for up to 3 years, unless progression or intolerance. -Will see back in 2 months with labs.    FOLLOW UP: Plz schedule next 2 cycles of maintenance  Rituxan every 60 days with labs and MD visits  The total time spent in the appointment was 20 minutes and more than 50% was on counseling and direct patient cares.  All of the patient's questions were answered with apparent satisfaction. The patient knows to call the clinic with any problems, questions or concerns.   Sullivan Lone MD [place] AAHIVMS [facility] Hematology/Oncology Physician [facility]  (Office):       [phone] (Work cell):  [phone] (Fax):           [phone]  I, Reinaldo Raddle, am acting as scribe for Dr. Sullivan Lone, MD.    .I have reviewed the above documentation for accuracy and completeness, and I agree with the above. Brunetta Genera MD

## 2020-11-03 ENCOUNTER — Ambulatory Visit: Payer: Medicare Other

## 2020-11-03 ENCOUNTER — Other Ambulatory Visit: Payer: Medicare Other

## 2020-11-03 ENCOUNTER — Ambulatory Visit: Payer: Medicare Other | Admitting: Hematology

## 2020-11-03 ENCOUNTER — Inpatient Hospital Stay (HOSPITAL_BASED_OUTPATIENT_CLINIC_OR_DEPARTMENT_OTHER): Payer: Medicare Other | Admitting: Hematology

## 2020-11-03 ENCOUNTER — Other Ambulatory Visit: Payer: Self-pay

## 2020-11-03 ENCOUNTER — Inpatient Hospital Stay: Payer: Medicare Other | Attending: Hematology

## 2020-11-03 ENCOUNTER — Inpatient Hospital Stay: Payer: Medicare Other

## 2020-11-03 ENCOUNTER — Ambulatory Visit: Payer: Medicare Other | Admitting: Oncology

## 2020-11-03 VITALS — BP 112/56 | HR 62 | Temp 98.2°F | Resp 18

## 2020-11-03 VITALS — BP 117/73 | HR 64 | Temp 97.8°F | Resp 20 | Ht 66.0 in | Wt 166.5 lb

## 2020-11-03 DIAGNOSIS — Z7189 Other specified counseling: Secondary | ICD-10-CM

## 2020-11-03 DIAGNOSIS — Z5111 Encounter for antineoplastic chemotherapy: Secondary | ICD-10-CM | POA: Diagnosis not present

## 2020-11-03 DIAGNOSIS — Z5112 Encounter for antineoplastic immunotherapy: Secondary | ICD-10-CM | POA: Diagnosis not present

## 2020-11-03 DIAGNOSIS — C8307 Small cell B-cell lymphoma, spleen: Secondary | ICD-10-CM

## 2020-11-03 DIAGNOSIS — D61818 Other pancytopenia: Secondary | ICD-10-CM | POA: Diagnosis not present

## 2020-11-03 DIAGNOSIS — C8518 Unspecified B-cell lymphoma, lymph nodes of multiple sites: Secondary | ICD-10-CM | POA: Insufficient documentation

## 2020-11-03 DIAGNOSIS — I700 Atherosclerosis of aorta: Secondary | ICD-10-CM | POA: Diagnosis not present

## 2020-11-03 LAB — CBC WITH DIFFERENTIAL/PLATELET
Abs Immature Granulocytes: 0.1 10*3/uL — ABNORMAL HIGH (ref 0.00–0.07)
Basophils Absolute: 0.1 10*3/uL (ref 0.0–0.1)
Basophils Relative: 1 %
Eosinophils Absolute: 0.3 10*3/uL (ref 0.0–0.5)
Eosinophils Relative: 3 %
HCT: 42.7 % (ref 36.0–46.0)
Hemoglobin: 14.3 g/dL (ref 12.0–15.0)
Lymphocytes Relative: 48 %
Lymphs Abs: 4.1 10*3/uL — ABNORMAL HIGH (ref 0.7–4.0)
MCH: 30.7 pg (ref 26.0–34.0)
MCHC: 33.5 g/dL (ref 30.0–36.0)
MCV: 91.6 fL (ref 80.0–100.0)
Metamyelocytes Relative: 1 %
Monocytes Absolute: 0.9 10*3/uL (ref 0.1–1.0)
Monocytes Relative: 10 %
Neutro Abs: 3.1 10*3/uL (ref 1.7–7.7)
Neutrophils Relative %: 37 %
Platelets: 372 10*3/uL (ref 150–400)
RBC: 4.66 MIL/uL (ref 3.87–5.11)
RDW: 13.8 % (ref 11.5–15.5)
WBC: 8.5 10*3/uL (ref 4.0–10.5)
nRBC: 0 % (ref 0.0–0.2)

## 2020-11-03 LAB — CMP (CANCER CENTER ONLY)
ALT: 16 U/L (ref 0–44)
AST: 17 U/L (ref 15–41)
Albumin: 3.8 g/dL (ref 3.5–5.0)
Alkaline Phosphatase: 64 U/L (ref 38–126)
Anion gap: 8 (ref 5–15)
BUN: 12 mg/dL (ref 8–23)
CO2: 23 mmol/L (ref 22–32)
Calcium: 9.1 mg/dL (ref 8.9–10.3)
Chloride: 108 mmol/L (ref 98–111)
Creatinine: 0.76 mg/dL (ref 0.44–1.00)
GFR, Estimated: >60 mL/min (ref >60)
Glucose, Bld: 86 mg/dL (ref 70–99)
Potassium: 3.9 mmol/L (ref 3.5–5.1)
Sodium: 139 mmol/L (ref 135–145)
Total Bilirubin: 1 mg/dL (ref 0.3–1.2)
Total Protein: 7.9 g/dL (ref 6.5–8.1)

## 2020-11-03 LAB — LACTATE DEHYDROGENASE: LDH: 190 U/L (ref 98–192)

## 2020-11-03 MED ORDER — ACETAMINOPHEN 500 MG PO TABS
1000.0000 mg | ORAL_TABLET | Freq: Once | ORAL | Status: AC
  Administered 2020-11-03: 1000 mg via ORAL

## 2020-11-03 MED ORDER — LORAZEPAM 1 MG PO TABS
0.5000 mg | ORAL_TABLET | Freq: Once | ORAL | Status: AC
  Administered 2020-11-03: 0.5 mg via ORAL

## 2020-11-03 MED ORDER — ACETAMINOPHEN 500 MG PO TABS
ORAL_TABLET | ORAL | Status: AC
  Filled 2020-11-03: qty 2

## 2020-11-03 MED ORDER — DIPHENHYDRAMINE HCL 50 MG/ML IJ SOLN
INTRAMUSCULAR | Status: AC
  Filled 2020-11-03: qty 1

## 2020-11-03 MED ORDER — METHYLPREDNISOLONE SODIUM SUCC 125 MG IJ SOLR
INTRAMUSCULAR | Status: AC
  Filled 2020-11-03: qty 2

## 2020-11-03 MED ORDER — FAMOTIDINE IN NACL 20-0.9 MG/50ML-% IV SOLN
20.0000 mg | Freq: Once | INTRAVENOUS | Status: AC
  Administered 2020-11-03: 20 mg via INTRAVENOUS

## 2020-11-03 MED ORDER — ONDANSETRON HCL 4 MG/2ML IJ SOLN
8.0000 mg | Freq: Once | INTRAMUSCULAR | Status: AC
  Administered 2020-11-03: 8 mg via INTRAVENOUS

## 2020-11-03 MED ORDER — MONTELUKAST SODIUM 10 MG PO TABS
10.0000 mg | ORAL_TABLET | Freq: Once | ORAL | Status: AC
  Administered 2020-11-03: 10 mg via ORAL

## 2020-11-03 MED ORDER — SODIUM CHLORIDE 0.9 % IV SOLN
375.0000 mg/m2 | Freq: Once | INTRAVENOUS | Status: AC
  Administered 2020-11-03: 700 mg via INTRAVENOUS
  Filled 2020-11-03: qty 50

## 2020-11-03 MED ORDER — DIPHENHYDRAMINE HCL 50 MG/ML IJ SOLN
50.0000 mg | Freq: Once | INTRAMUSCULAR | Status: AC
  Administered 2020-11-03: 50 mg via INTRAVENOUS

## 2020-11-03 MED ORDER — MONTELUKAST SODIUM 10 MG PO TABS
ORAL_TABLET | ORAL | Status: AC
  Filled 2020-11-03: qty 1

## 2020-11-03 MED ORDER — MORPHINE SULFATE 4 MG/ML IJ SOLN
2.0000 mg | Freq: Once | INTRAMUSCULAR | Status: AC
  Administered 2020-11-03: 2 mg via INTRAVENOUS
  Filled 2020-11-03: qty 1

## 2020-11-03 MED ORDER — ONDANSETRON HCL 4 MG/2ML IJ SOLN
INTRAMUSCULAR | Status: AC
  Filled 2020-11-03: qty 4

## 2020-11-03 MED ORDER — MORPHINE SULFATE (PF) 2 MG/ML IV SOLN
INTRAVENOUS | Status: AC
  Filled 2020-11-03: qty 1

## 2020-11-03 MED ORDER — METHYLPREDNISOLONE SODIUM SUCC 125 MG IJ SOLR
125.0000 mg | Freq: Once | INTRAMUSCULAR | Status: AC
  Administered 2020-11-03: 125 mg via INTRAVENOUS

## 2020-11-03 MED ORDER — LORAZEPAM 1 MG PO TABS
ORAL_TABLET | ORAL | Status: AC
  Filled 2020-11-03: qty 1

## 2020-11-03 MED ORDER — SODIUM CHLORIDE 0.9 % IV SOLN
Freq: Once | INTRAVENOUS | Status: DC
  Filled 2020-11-03: qty 250

## 2020-11-03 MED ORDER — FAMOTIDINE IN NACL 20-0.9 MG/50ML-% IV SOLN
INTRAVENOUS | Status: AC
  Filled 2020-11-03: qty 50

## 2020-11-03 NOTE — Patient Instructions (Signed)
[facility] °Discharge Instructions for Patients Receiving Chemotherapy ° °Today you received the following chemotherapy agents: Rituximab ° °To help prevent nausea and vomiting after your treatment, we encourage you to take your nausea medication as directed.  °  °If you develop nausea and vomiting that is not controlled by your nausea medication, call the clinic.  ° °BELOW ARE SYMPTOMS THAT SHOULD BE REPORTED IMMEDIATELY: °· *FEVER GREATER THAN 100.5 F °· *CHILLS WITH OR WITHOUT FEVER °· NAUSEA AND VOMITING THAT IS NOT CONTROLLED WITH YOUR NAUSEA MEDICATION °· *UNUSUAL SHORTNESS OF BREATH °· *UNUSUAL BRUISING OR BLEEDING °· TENDERNESS IN MOUTH AND THROAT WITH OR WITHOUT PRESENCE OF ULCERS °· *URINARY PROBLEMS °· *BOWEL PROBLEMS °· UNUSUAL RASH °Items with * indicate a potential emergency and should be followed up as soon as possible. ° °Feel free to call the clinic should you have any questions or concerns. The clinic phone number is [phone]. ° °Please show the CHEMO ALERT CARD at check-in to the Emergency Department and triage nurse. ° ° °

## 2020-11-03 NOTE — Progress Notes (Signed)
Pt discharged in no apparent distress. Pt left ambulatory without assistance. Pt aware of discharge instructions and verbalized understanding and had no further questions.  

## 2020-11-10 ENCOUNTER — Encounter: Payer: Self-pay | Admitting: Hematology

## 2020-11-21 ENCOUNTER — Other Ambulatory Visit: Payer: Self-pay | Admitting: Hematology

## 2020-11-23 NOTE — Telephone Encounter (Signed)
Please review for refill thank you. 

## 2020-11-25 ENCOUNTER — Other Ambulatory Visit: Payer: Self-pay | Admitting: Hematology

## 2020-11-25 MED FILL — ACYCLOVIR 200 MG CAP: 200 | 30 days supply | Qty: 120 | Fill #0

## 2020-12-07 ENCOUNTER — Encounter: Payer: Self-pay | Admitting: Hematology

## 2020-12-17 ENCOUNTER — Telehealth: Payer: Self-pay | Admitting: Hematology

## 2020-12-17 NOTE — Telephone Encounter (Signed)
Left message with changed provider visit due to provider's PAL.

## 2021-01-04 NOTE — Progress Notes (Signed)
HEMATOLOGY/ONCOLOGY FOLLOW UP NOTE  Date of Service:  01/05/21     Patient Care Team: Alroy Dust, L.Marlou Sa, MD as PCP - General (Family Medicine)  CHIEF COMPLAINTS F/u for continued management of SMZL   HISTORY OF PRESENTING ILLNESS:   Michelle Cohen is a wonderful 70 y.o. female who has been referred to [place] from [facility] for evaluation and management of B-cell lymphoma. She presents to her appointment today with her partner of 21 years. She states that she moved to the area ~2 years ago secondary to her sister's diagnosis of lung cancer (unfortunately, now passed) and taking care of her mother's Alzheimer's. Generally, prior to this issue she has been very healthy and without chronic medical conditions and not on chronic medical therapies/treatments.   Initially, the patient was admitted to [facility] for abdominal pain and significant hernia. She reports that she was visiting in [place] and while flying there she had an acute onset of sweats and left lower quadrant pain. She reports that her hernia was not present at that time, but as she continued to fly her hernia had become more noticeable and enlarged in her lower abdomen. Additionally, her partner reports that her episode of hydrosis was very significant, stating that it was pooling below her. On their arrival to [facility] on 06/21/17, a CT A/P was performed which showed an incarcerated hernia. Of note, her CT showed massive splenomegaly at 32cm in greatest dimension. Manual reduction of her hernia was attempted while in the ED but was unsuccessful. Pre-operative notes state that following intubation manual reduction was performed successfully and hernia was repaired laparoscopically. CT CAP was performed as well which was without the presence of additional surrounding LAD. She did have flow cytometry performed during her admission as well which showed B-cell lymphocytes which were monoclonal. A bone marrow biopsy was  also examined which showed that it was markedly hypercellular (~95%) with prominent interstitial infiltrate of small lymphocytes.   Prior to her diagnosis, she mostly felt typically well overall. She states that she did suffer from insomnia which she related to over-thinking at the time regarding her familial situation. She did experience some fatigue and weight loss with this as well. Her partner reports that she noticed her weight loss approximately 49moago, which she quantifies at approximately 30lbs since onset. Towards the end of this six month periods and just prior to the appearance of her hernia the patient began to notice some bothersome abdominal distension. She also noticed some significant fatigue and she would nap for longer periods following her daily workouts. Additionally, her partner would notice that she would have issues with breathing and this appeared irregular and more labored, especially at night while laying down. She also mentions that she did experience intermittent night sweats throughout this six month period as well. She denies lightheadedness, dizziness, abdominal pain, back pain, fever, chills, abnormal bruising/bleeding, bloody stools, epistaxis, hematuria, gingival bleeding, or other bleeding issues within this period. She has not received any blood transfusions and has not previously had a tattoo; she is unconcerned with prior HepC exposure.   Since her hernia surgery, she reports that she feels great symptomatically overall and much improved. She does have some issues with abdominal bloating and discomfort, but this has been manageable. No overt abdominal pain.   Throughout all of this, she has attempted to try to remain within good spirits. Losing her sister recently has been hard, but she has been working through it with  her family; however, her partner voices that they do not have strong social support in this area as most of their support is in [place] where they  previously lived. She has been working through her sister's death with grief counseling which has been helping her.    She has never previously smoked, but she reports some moderate second-hand smoke through her father at a younger age. Her father did die of a PE, but she is unaware of any other diagnoses of clotting disorders or cancers within her family. No h/o alcohol abuse, chemical/radiation exposures. She worked previously in Industrial/product designer.   PREVIOUS TREATMENT:  Rituximab weekly x4 beginning on 07/21/17-08/11/17 Received additional Rituxan weekly x 4 doses Rituximab  Weekly for 4 weeks starting 10/05/17 - completed 10/2017. S/p splenectomy at [facility] with Dr Shon Hough.  CURRENT TREATMENT:  Bimonthly Rituxan  INTERIM HISTORY:   Michelle Cohen returns today for scheduled follow up of her SMZL. We are joined today by her wife, Michelle Cohen. The patient's last visit with [place] was on 11/03/2020. The pt reports that she is doing well overall. She is here for C17D1 of continued maintenance dosage of Rituxan.  The pt reports no new symptoms or concerns. The pt notes that she is now a volunteer in the healing garden here and is enjoying that. The pt notes she is staying active and eating well.  Lab results today 01/05/2021 of CBC w/diff and CMP is as follows: all values are WNL. 01/05/2021 Uric Acid of 5.6.  On review of systems, pt denies any symptoms.  MEDICAL HISTORY:  Past Medical History:  Diagnosis Date  . B-cell lymphoma ([place]) 06/2017     SURGICAL HISTORY: Past Surgical History:  Procedure Laterality Date  . left inguinal hernia repair Left   . SPLENECTOMY, TOTAL  02/13/2018    SOCIAL HISTORY: Social History   Socioeconomic History  . Marital status: Single    Spouse name: Not on file  . Number of children: Not on file  . Years of education: Not on file  . Highest education level: Not on file  Occupational History  . Not on file  Tobacco Use  .  Smoking status: Never Smoker  . Smokeless tobacco: Never Used  Vaping Use  . Vaping Use: Never used  Substance and Sexual Activity  . Alcohol use: Yes    Comment: glass of wine occasionally  . Drug use: No  . Sexual activity: Not on file  Other Topics Concern  . Not on file  Social History Narrative  . Not on file   Social Determinants of Health   Financial Resource Strain: Not on file  Food Insecurity: Not on file  Transportation Needs: Not on file  Physical Activity: Not on file  Stress: Not on file  Social Connections: Not on file  Intimate Partner Violence: Not on file    FAMILY HISTORY: Sister - lung cancer Mother -Alzheimers dementia  ALLERGIES:  is allergic to allopurinol and tape.  MEDICATIONS:  Current Outpatient Medications  Medication Sig Dispense Refill  . acetaminophen (TYLENOL) 500 MG tablet Take 500 mg by mouth every 8 (eight) hours as needed for moderate pain.     Marland Kitchen acyclovir (ZOVIRAX) 200 MG capsule TAKE 2 CAPSULES BY MOUTH TWICE DAILY 120 capsule 1  . albuterol (PROVENTIL HFA;VENTOLIN HFA) 108 (90 Base) MCG/ACT inhaler Inhale 1-2 puffs into the lungs every 6 (six) hours as needed for wheezing or shortness of breath. 1 Inhaler 0  . aspirin  81 MG EC tablet Take by mouth.    Marland Kitchen augmented betamethasone dipropionate (DIPROLENE-AF) 0.05 % cream SMARTSIG:Sparingly Topical Twice Daily PRN    . benzonatate (TESSALON) 200 MG capsule Take 1 capsule (200 mg total) by mouth 3 (three) times daily as needed for cough. 30 capsule 0  . chlorhexidine (PERIDEX) 0.12 % solution SWISH WITH 15 MLS IN MOUTH OR THROAT THEN SPIT --USE TWICE DAILY AS DIRECTED 473 mL 1  . chlorpheniramine-HYDROcodone (TUSSIONEX PENNKINETIC ER) 10-8 MG/5ML SUER Take 5 mLs by mouth every 12 (twelve) hours as needed for cough. 60 mL 0  . Cyanocobalamin (VITAMIN B 12 PO) Take by mouth.    . Diphenhyd-Hydrocort-Nystatin (FIRST-DUKES MOUTHWASH MT) Take by mouth.    . docusate sodium (COLACE) 100 MG  capsule Take 100 mg by mouth 2 (two) times daily.    Marland Kitchen docusate sodium (COLACE) 50 MG capsule Take by mouth.    . erythromycin ophthalmic ointment SMARTSIG:In Eye(s)    . fluocinonide (LIDEX) 0.05 % external solution SMARTSIG:1 Milliliter(s) Topical Every Night PRN    . fluticasone (FLONASE) 50 MCG/ACT nasal spray Place 2 sprays into both nostrils daily. 16 g 0  . folic acid (FOLVITE) 1 MG tablet Take by mouth.    . hydrOXYzine (ATARAX/VISTARIL) 25 MG tablet Take 1-2 tablets (25-50 mg total) by mouth every 4 (four) hours as needed. 30 tablet 0  . hydrOXYzine (VISTARIL) 25 MG capsule Take by mouth.    Marland Kitchen ketoconazole (NIZORAL) 2 % shampoo Apply 1 application topically 2 (two) times a week. 120 mL 1  . Loratadine 10 MG CAPS Take 10 mg by mouth daily.    . magic mouthwash w/lidocaine SOLN Take 5 mLs by mouth 4 (four) times daily as needed for mouth pain. Swish and spit 1 Part viscous lidocaine 2% 1 Part Maalox (do not substitute Kaopectate) 1 Part diphenhydramine 12.5 mg per 5 ml elixir. 120 mL 2  . magic mouthwash w/lidocaine SOLN Take 5 mLs by mouth 4 (four) times daily as needed for mouth pain. Swish and spit 120 mL 0  . moxifloxacin (VIGAMOX) 0.5 % ophthalmic solution     . oxyCODONE (OXY IR/ROXICODONE) 5 MG immediate release tablet Take by mouth.    . Prednisolon-Gatiflox-Bromfenac 1-0.5-0.075 % SUSP Place 1 drop into the right eye 4 (four) times daily.    . prednisoLONE acetate (PRED FORTE) 1 % ophthalmic suspension     . predniSONE (DELTASONE) 20 MG tablet Take 2 tablets (40 mg total) by mouth daily with breakfast. Starting 5 days prior to 1st dose of Rituxan 60 tablet 0  . senna (SENOKOT) 8.6 MG TABS tablet Take 1 tablet by mouth daily as needed for mild constipation.     Marland Kitchen Spacer/Aero-Holding Chambers (AEROCHAMBER PLUS) inhaler Use as instructed 1 each 2  . triamcinolone cream (KENALOG) 0.1 % Apply 1 application topically 2 (two) times daily. 45 g 0   No current facility-administered  medications for this visit.    REVIEW OF SYSTEMS:   10 Point review of Systems was done is negative except as noted above.  PHYSICAL EXAMINATION:  ECOG PERFORMANCE STATUS: 1 BP 121/81 (BP Location: Left Arm, Patient Position: Sitting)   Pulse 65   Temp 97.8 F (36.6 C) (Tympanic)   Resp 16   Ht '5\' 6"'  (1.676 m)   Wt 163 lb 4.8 oz (74.1 kg)   SpO2 100%   BMI 26.36 kg/m    NAD. GENERAL:alert, in no acute distress and comfortable SKIN: no acute rashes,  no significant lesions EYES: conjunctiva are pink and non-injected, sclera anicteric OROPHARYNX: MMM, no exudates, no oropharyngeal erythema or ulceration NECK: supple, no JVD LYMPH:  no palpable lymphadenopathy in the cervical, axillary or inguinal regions LUNGS: clear to auscultation b/l with normal respiratory effort HEART: regular rate & rhythm ABDOMEN:  normoactive bowel sounds , non tender, not distended. Extremity: no pedal edema PSYCH: alert & oriented x 3 with fluent speech NEURO: no focal motor/sensory deficits  LABORATORY DATA:   I have reviewed the data as listed   CBC Latest Ref Rng & Units 01/05/2021 11/03/2020 09/02/2020  WBC 4.0 - 10.5 K/uL 5.4 8.5 14.7(H)  Hemoglobin 12.0 - 15.0 g/dL 14.1 14.3 12.6  Hematocrit 36.0 - 46.0 % 42.6 42.7 37.0  Platelets 150 - 400 K/uL 262 372 319  ANC 4.1k . CBC    Component Value Date/Time   WBC 5.4 01/05/2021 0947   WBC 8.5 11/03/2020 0850   RBC 4.71 01/05/2021 0947   HGB 14.1 01/05/2021 0947   HGB 9.1 (L) 09/14/2017 0835   HCT 42.6 01/05/2021 0947   HCT 35.2 03/05/2018 1331   HCT 29.0 (L) 09/14/2017 0835   PLT 262 01/05/2021 0947   PLT 46 (L) 09/14/2017 0835   MCV 90.4 01/05/2021 0947   MCV 92.1 09/14/2017 0835   MCH 29.9 01/05/2021 0947   MCHC 33.1 01/05/2021 0947   RDW 13.5 01/05/2021 0947   RDW 18.2 (H) 09/14/2017 0835   LYMPHSABS 1.4 01/05/2021 0947   LYMPHSABS 2.0 09/14/2017 0835   MONOABS 0.6 01/05/2021 0947   MONOABS 0.3 09/14/2017 0835   EOSABS 0.3  01/05/2021 0947   EOSABS 0.0 09/14/2017 0835   BASOSABS 0.1 01/05/2021 0947   BASOSABS 0.0 09/14/2017 0835    . CMP Latest Ref Rng & Units 01/05/2021 11/03/2020 09/02/2020  Glucose 70 - 99 mg/dL 91 86 85  BUN 8 - 23 mg/dL '14 12 11  ' Creatinine 0.44 - 1.00 mg/dL 0.73 0.76 0.72  Sodium 135 - 145 mmol/L 140 139 141  Potassium 3.5 - 5.1 mmol/L 4.1 3.9 3.9  Chloride 98 - 111 mmol/L 108 108 109  CO2 22 - 32 mmol/L '23 23 24  ' Calcium 8.9 - 10.3 mg/dL 9.0 9.1 9.0  Total Protein 6.5 - 8.1 g/dL 7.5 7.9 7.1  Total Bilirubin 0.3 - 1.2 mg/dL 1.2 1.0 1.0  Alkaline Phos 38 - 126 U/L 77 64 56  AST 15 - 41 U/L '23 17 16  ' ALT 0 - 44 U/L '19 16 12   ' Component     Latest Ref Rng & Units 07/13/2017  LDH     125 - 245 U/L 232  Hep C Virus Ab     0.0 - 0.9 s/co ratio <0.1  HIV Screen 4th Generation wRfx     Non Reactive Non Reactive  Hep B Core Ab, Tot     Negative Negative  Hepatitis B Surface Ag     Negative Negative   Component     Latest Ref Rng & Units 07/28/2017  LDH     125 - 245 U/L 200  Uric Acid, Serum     2.6 - 7.4 mg/dl 6.0  Phosphorus     2.5 - 4.5 mg/dL 3.8   03/05/18 Pathology Report Splenectomy:      RADIOGRAPHIC STUDIES: I have personally reviewed the radiological images as listed and agreed with the findings in the report. No results found.   ASSESSMENT & PLAN:   Lanay Zinda is a wonderful 70  y.o. 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  Her clinical presentation is also representative of this and that this may have been present over months to possibly years.  -With her pattern of  involvement and results thus far it is likely that she has splenic marginal zone type lymphoma.  -PET scan on 07/25/2017 with results showing: Massive splenomegaly, splenic volume 5770 cubic cm, with homogeneous low grade activity throughout the spleen equal to that of the physiologic activity in the liver, and above the background blood pool activity in the mediastinum. No pathologic adenopathy identified. No bony involvement noted.   -CT A/p on 08/23/17 with results of: Marked splenomegaly without substantial interval change in splenic size/volume. Borderline lymphadenopathy in the hepatoduodenal ligament. Aortic Atherosclerosis. I discussed this with the patient in great detail.  -CT A/p on 09/15/17 shows significant splenomegaly shows minimal smaller change in size, with no evidence of bowel obstruction.  -12/31/2019 CT C/A/P ([id number]) ([id number]) revealed "1. 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 Although there are scattered tiny lymph nodes in the chest, no lymphadenopathy by size criteria in the thorax or pelvis. 2. Hepatomegaly. 3. Prior splenectomy. 4. Enlargement of the pulmonary outflow tract and main pulmonary arteries suggests pulmonary arterial hypertension. 5.  Aortic Atherosclerois (ICD10-170.0)."   2. Pancytopenia s/p thrombocytopenia and anemia  Due to SMZL and hypersplenism Now resolved  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 Had to complete Rituxan at lower rate. Patient would not be a candidate for rapid infusion protocol and would need to keep the next infusion at the tolerated rate and not rate escalate. Plan  Completed with extensive pre-medications.  4. Oral mucositis/HSV outbreak - resolved -continue with acyclovir as needed   PLAN: -Discussed pt labwork today, 01/05/2021; blood counts and chemistries  all completely normal. Uric acid normal. -Recommended pt receive the second COVID booster as recently approved by the CDC. -Discussed [facility] and pt's eligibility. Will send referral. -Advised pt we will most likely stop maintenance treatment following completion of 1 year. This will be around June. The pt is agreeable to finishing with June treatment as scheduled. -Will see back every 3-4 months following completion of one year of maintenance treatment. -Recommended pt wait a few months post-treatment completion prior to getting routine dental cleaning if able to. -Advised pt it would be okay to take trip if avoid crowds and crowded areas. Continue to stay cautious of surroundings and social settings. -No lab or clinical evidence of SMZL progression at this time.  -No prohibitive toxicities from continuing Rituxan every two months at this time. -Will see back in 5 months with labs.   FOLLOW UP: Ambulatory referral to [facility] F/u as per scheduled appointments on 6/14 for final planned dose of Rituxan , labs and f/u with [facility] with Dr Irene Limbo with labs in 5 months    The total time spent in the appointment was 30 minutes and more than 50% was on counseling and direct patient cares.  All of the patient's questions were answered with apparent satisfaction.  The patient knows to call the clinic with any problems, questions or concerns.   Sullivan Lone MD [place] AAHIVMS [facility]ematology/Oncology Physician [facility]  (Office):       [phone] (Work cell):  [phone] (Fax):           [phone]  I, Reinaldo Raddle, am acting as scribe for Dr. Sullivan Lone, MD.     .I have reviewed the above documentation for accuracy and completeness, and I agree with the above. Brunetta Genera MD

## 2021-01-05 ENCOUNTER — Inpatient Hospital Stay: Payer: Medicare Other

## 2021-01-05 ENCOUNTER — Other Ambulatory Visit: Payer: Self-pay

## 2021-01-05 ENCOUNTER — Inpatient Hospital Stay (HOSPITAL_BASED_OUTPATIENT_CLINIC_OR_DEPARTMENT_OTHER): Payer: Medicare Other | Admitting: Hematology

## 2021-01-05 ENCOUNTER — Inpatient Hospital Stay: Payer: Medicare Other | Attending: Hematology

## 2021-01-05 VITALS — BP 121/81 | HR 65 | Temp 97.8°F | Resp 16 | Ht 66.0 in | Wt 163.3 lb

## 2021-01-05 VITALS — BP 104/55 | HR 57 | Temp 98.3°F | Resp 17

## 2021-01-05 DIAGNOSIS — C8517 Unspecified B-cell lymphoma, spleen: Secondary | ICD-10-CM | POA: Diagnosis not present

## 2021-01-05 DIAGNOSIS — C8307 Small cell B-cell lymphoma, spleen: Secondary | ICD-10-CM

## 2021-01-05 DIAGNOSIS — Z5111 Encounter for antineoplastic chemotherapy: Secondary | ICD-10-CM | POA: Insufficient documentation

## 2021-01-05 DIAGNOSIS — R160 Hepatomegaly, not elsewhere classified: Secondary | ICD-10-CM | POA: Insufficient documentation

## 2021-01-05 DIAGNOSIS — Z5112 Encounter for antineoplastic immunotherapy: Secondary | ICD-10-CM | POA: Diagnosis not present

## 2021-01-05 DIAGNOSIS — Z7189 Other specified counseling: Secondary | ICD-10-CM

## 2021-01-05 LAB — CBC WITH DIFFERENTIAL (CANCER CENTER ONLY)
Abs Immature Granulocytes: 0.01 10*3/uL (ref 0.00–0.07)
Basophils Absolute: 0.1 10*3/uL (ref 0.0–0.1)
Basophils Relative: 1 %
Eosinophils Absolute: 0.3 10*3/uL (ref 0.0–0.5)
Eosinophils Relative: 6 %
HCT: 42.6 % (ref 36.0–46.0)
Hemoglobin: 14.1 g/dL (ref 12.0–15.0)
Immature Granulocytes: 0 %
Lymphocytes Relative: 26 %
Lymphs Abs: 1.4 10*3/uL (ref 0.7–4.0)
MCH: 29.9 pg (ref 26.0–34.0)
MCHC: 33.1 g/dL (ref 30.0–36.0)
MCV: 90.4 fL (ref 80.0–100.0)
Monocytes Absolute: 0.6 10*3/uL (ref 0.1–1.0)
Monocytes Relative: 11 %
Neutro Abs: 3 10*3/uL (ref 1.7–7.7)
Neutrophils Relative %: 56 %
Platelet Count: 262 10*3/uL (ref 150–400)
RBC: 4.71 MIL/uL (ref 3.87–5.11)
RDW: 13.5 % (ref 11.5–15.5)
WBC Count: 5.4 10*3/uL (ref 4.0–10.5)
nRBC: 0 % (ref 0.0–0.2)

## 2021-01-05 LAB — CMP (CANCER CENTER ONLY)
ALT: 19 U/L (ref 0–44)
AST: 23 U/L (ref 15–41)
Albumin: 3.8 g/dL (ref 3.5–5.0)
Alkaline Phosphatase: 77 U/L (ref 38–126)
Anion gap: 9 (ref 5–15)
BUN: 14 mg/dL (ref 8–23)
CO2: 23 mmol/L (ref 22–32)
Calcium: 9 mg/dL (ref 8.9–10.3)
Chloride: 108 mmol/L (ref 98–111)
Creatinine: 0.73 mg/dL (ref 0.44–1.00)
GFR, Estimated: >60 mL/min (ref >60)
Glucose, Bld: 91 mg/dL (ref 70–99)
Potassium: 4.1 mmol/L (ref 3.5–5.1)
Sodium: 140 mmol/L (ref 135–145)
Total Bilirubin: 1.2 mg/dL (ref 0.3–1.2)
Total Protein: 7.5 g/dL (ref 6.5–8.1)

## 2021-01-05 LAB — URIC ACID: Uric Acid, Serum: 5.6 mg/dL (ref 2.5–7.1)

## 2021-01-05 MED ORDER — LORAZEPAM 1 MG PO TABS
0.5000 mg | ORAL_TABLET | Freq: Once | ORAL | Status: AC
  Administered 2021-01-05: 0.5 mg via ORAL

## 2021-01-05 MED ORDER — FAMOTIDINE IN NACL 20-0.9 MG/50ML-% IV SOLN
20.0000 mg | Freq: Once | INTRAVENOUS | Status: AC
  Administered 2021-01-05: 20 mg via INTRAVENOUS

## 2021-01-05 MED ORDER — SODIUM CHLORIDE 0.9 % IV SOLN
Freq: Once | INTRAVENOUS | Status: AC
  Filled 2021-01-05: qty 250

## 2021-01-05 MED ORDER — DIPHENHYDRAMINE HCL 50 MG/ML IJ SOLN
50.0000 mg | Freq: Once | INTRAMUSCULAR | Status: AC
Start: 2021-01-05 — End: 2021-01-05
  Administered 2021-01-05: 50 mg via INTRAVENOUS

## 2021-01-05 MED ORDER — LORAZEPAM 1 MG PO TABS
ORAL_TABLET | ORAL | Status: AC
  Filled 2021-01-05: qty 1

## 2021-01-05 MED ORDER — FAMOTIDINE IN NACL 20-0.9 MG/50ML-% IV SOLN
INTRAVENOUS | Status: AC
  Filled 2021-01-05: qty 50

## 2021-01-05 MED ORDER — DIPHENHYDRAMINE HCL 50 MG/ML IJ SOLN
INTRAMUSCULAR | Status: AC
  Filled 2021-01-05: qty 1

## 2021-01-05 MED ORDER — METHYLPREDNISOLONE SODIUM SUCC 125 MG IJ SOLR
125.0000 mg | Freq: Once | INTRAMUSCULAR | Status: AC
  Administered 2021-01-05: 125 mg via INTRAVENOUS

## 2021-01-05 MED ORDER — SODIUM CHLORIDE 0.9 % IV SOLN
375.0000 mg/m2 | Freq: Once | INTRAVENOUS | Status: AC
  Administered 2021-01-05: 700 mg via INTRAVENOUS
  Filled 2021-01-05: qty 50

## 2021-01-05 MED ORDER — MONTELUKAST SODIUM 10 MG PO TABS
ORAL_TABLET | ORAL | Status: AC
  Filled 2021-01-05: qty 1

## 2021-01-05 MED ORDER — MONTELUKAST SODIUM 10 MG PO TABS
10.0000 mg | ORAL_TABLET | Freq: Once | ORAL | Status: AC
  Administered 2021-01-05: 10 mg via ORAL

## 2021-01-05 MED ORDER — MORPHINE SULFATE 4 MG/ML IJ SOLN
2.0000 mg | Freq: Once | INTRAMUSCULAR | Status: AC
  Administered 2021-01-05: 2 mg via INTRAVENOUS
  Filled 2021-01-05: qty 1

## 2021-01-05 MED ORDER — ONDANSETRON HCL 4 MG/2ML IJ SOLN
8.0000 mg | Freq: Once | INTRAMUSCULAR | Status: AC
Start: 2021-01-05 — End: 2021-01-05
  Administered 2021-01-05: 8 mg via INTRAVENOUS

## 2021-01-05 MED ORDER — ONDANSETRON HCL 4 MG/2ML IJ SOLN
INTRAMUSCULAR | Status: AC
  Filled 2021-01-05: qty 4

## 2021-01-05 MED ORDER — ACETAMINOPHEN 500 MG PO TABS
ORAL_TABLET | ORAL | Status: AC
  Filled 2021-01-05: qty 2

## 2021-01-05 MED ORDER — MORPHINE SULFATE (PF) 4 MG/ML IV SOLN
INTRAVENOUS | Status: AC
  Filled 2021-01-05: qty 1

## 2021-01-05 MED ORDER — ACETAMINOPHEN 500 MG PO TABS
1000.0000 mg | ORAL_TABLET | Freq: Once | ORAL | Status: AC
Start: 2021-01-05 — End: 2021-01-05
  Administered 2021-01-05: 1000 mg via ORAL

## 2021-01-05 MED ORDER — METHYLPREDNISOLONE SODIUM SUCC 125 MG IJ SOLR
INTRAMUSCULAR | Status: AC
  Filled 2021-01-05: qty 2

## 2021-01-05 NOTE — Patient Instructions (Signed)
[facility] °Discharge Instructions for Patients Receiving Chemotherapy ° °Today you received the following chemotherapy agents: Rituximab ° °To help prevent nausea and vomiting after your treatment, we encourage you to take your nausea medication as directed.  °  °If you develop nausea and vomiting that is not controlled by your nausea medication, call the clinic.  ° °BELOW ARE SYMPTOMS THAT SHOULD BE REPORTED IMMEDIATELY: °· *FEVER GREATER THAN 100.5 F °· *CHILLS WITH OR WITHOUT FEVER °· NAUSEA AND VOMITING THAT IS NOT CONTROLLED WITH YOUR NAUSEA MEDICATION °· *UNUSUAL SHORTNESS OF BREATH °· *UNUSUAL BRUISING OR BLEEDING °· TENDERNESS IN MOUTH AND THROAT WITH OR WITHOUT PRESENCE OF ULCERS °· *URINARY PROBLEMS °· *BOWEL PROBLEMS °· UNUSUAL RASH °Items with * indicate a potential emergency and should be followed up as soon as possible. ° °Feel free to call the clinic should you have any questions or concerns. The clinic phone number is [phone]. ° °Please show the CHEMO ALERT CARD at check-in to the Emergency Department and triage nurse. ° ° °

## 2021-01-11 ENCOUNTER — Telehealth: Payer: Self-pay | Admitting: Hematology

## 2021-01-11 NOTE — Telephone Encounter (Signed)
Left message with follow-up appointment per 4/12 los. Gave option to call back to reschedule if needed.

## 2021-01-17 IMAGING — CT CT CHEST W/ CM
2 of 5 series · 12 of 36 positions shown, 15 images · IV contrast (OMNIPAQUE)
Comparison: Abdomen/pelvis CT 12/21/2017.

CLINICAL DATA: Splenic marginal zone lymphoma. Restaging.

EXAM:
CT CHEST, ABDOMEN, AND PELVIS WITH CONTRAST
TECHNIQUE: Multidetector CT imaging of the chest, abdomen and pelvis was
performed following the standard protocol during bolus
administration of intravenous contrast.
CONTRAST:  100mL OMNIPAQUE IOHEXOL 300 MG/ML  SOLN

[Series 2: cap with · axial · 0.78mm/px · z∈[+1079,+1589]mm · 9 of 128 slices shown, 12 images]
[im 13/128  mediastinal]
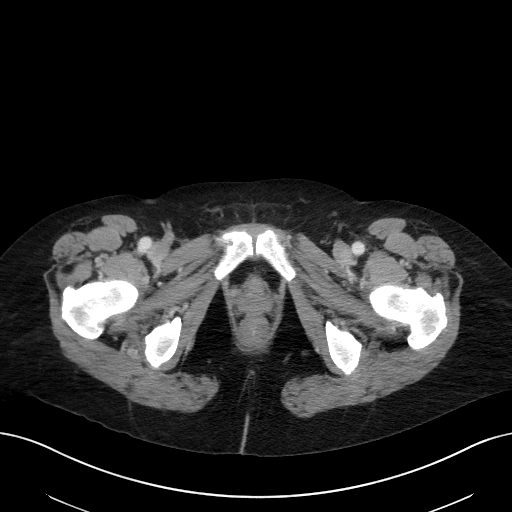
[im 13/128  lung]
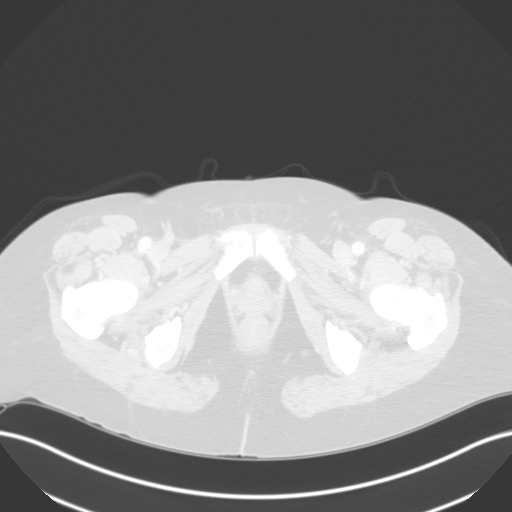
[im 26/128  lung]
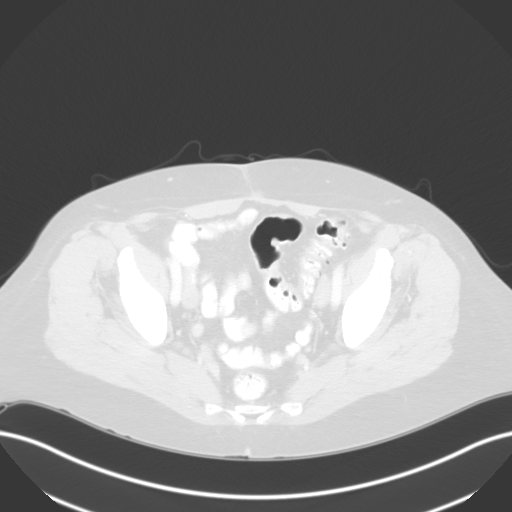
[im 39/128  lung]
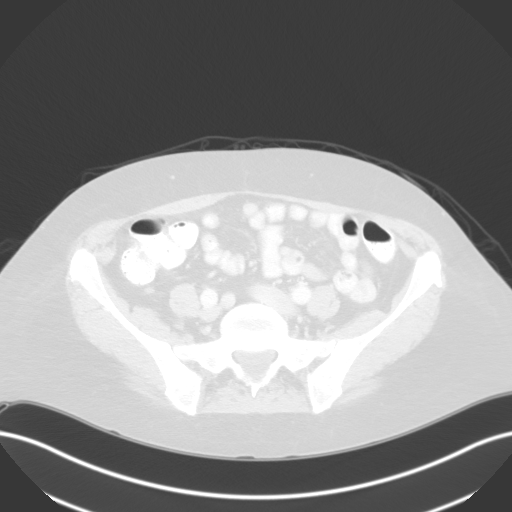
[im 51/128  lung]
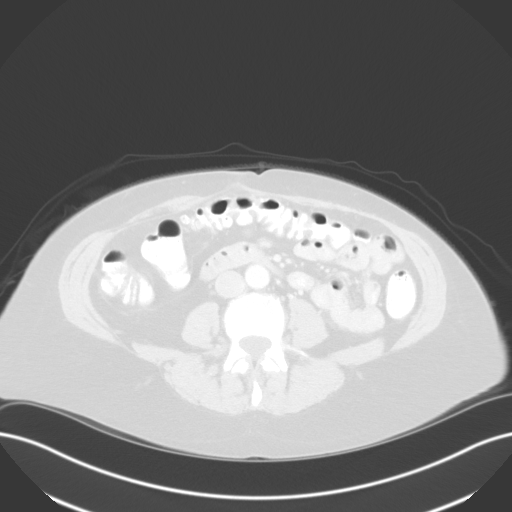
[im 64/128  mediastinal]
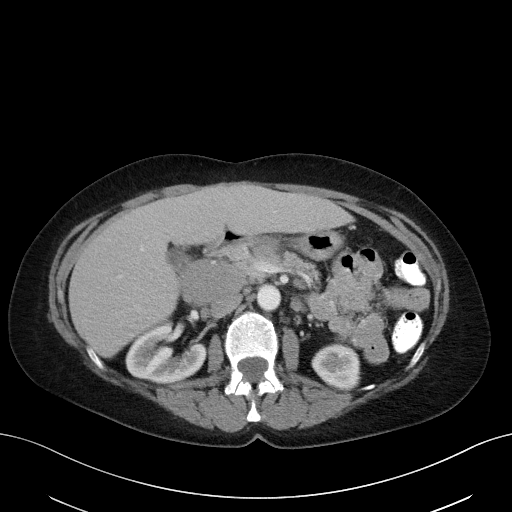
[im 64/128  lung]
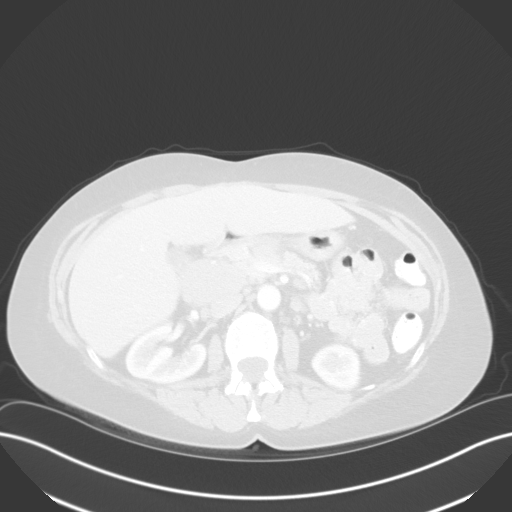
[im 77/128  lung]
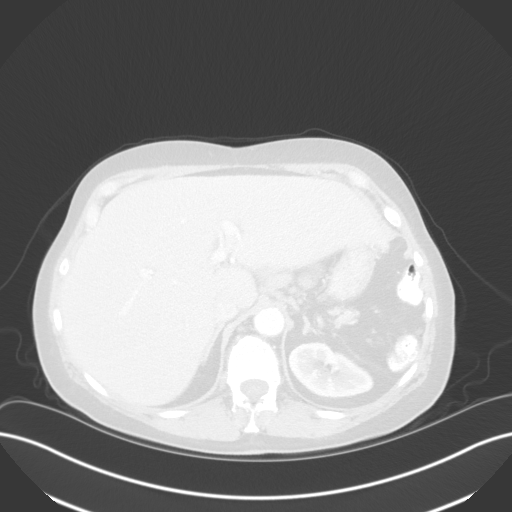
[im 89/128  lung]
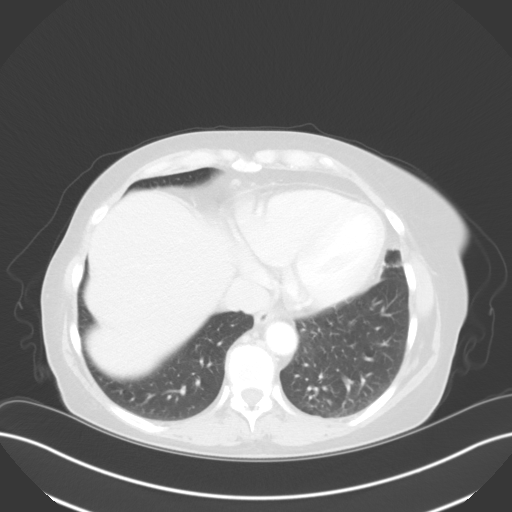
[im 102/128  lung]
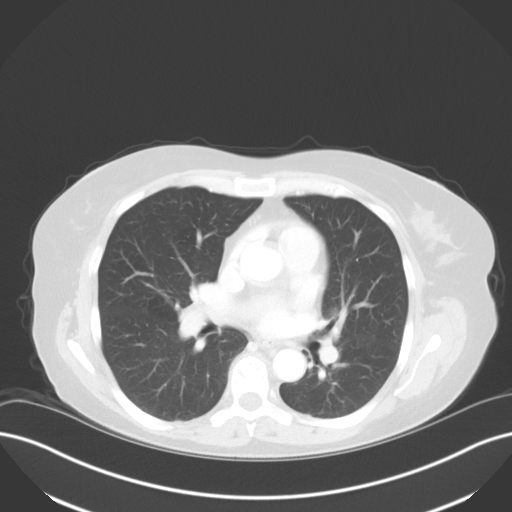
[im 115/128  mediastinal]
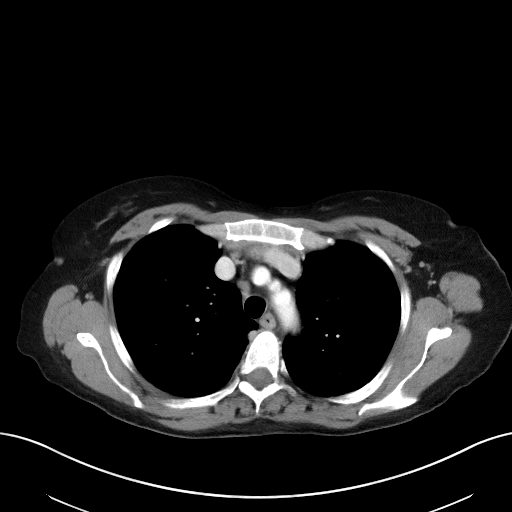
[im 115/128  lung]
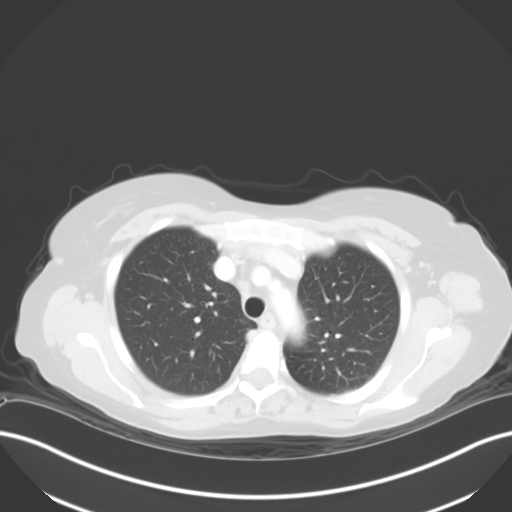

[Series 4: coronals · coronal · 0.91mm/px · 3 of 120 slices shown]
[im 24/120  lung]
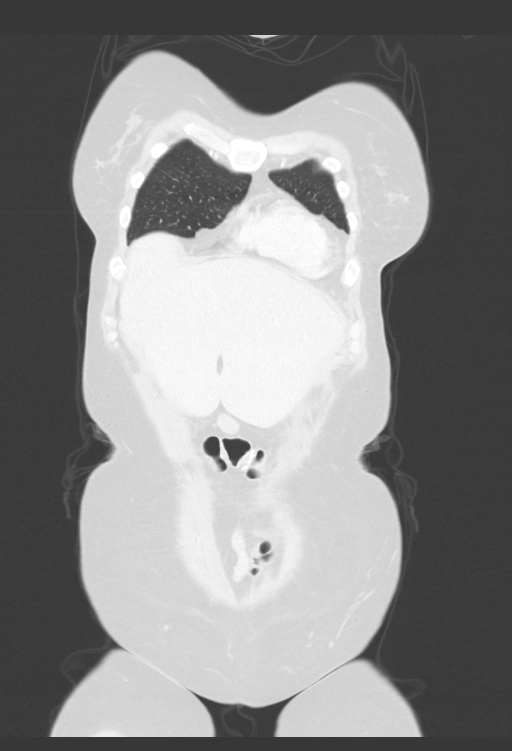
[im 48/120  lung]
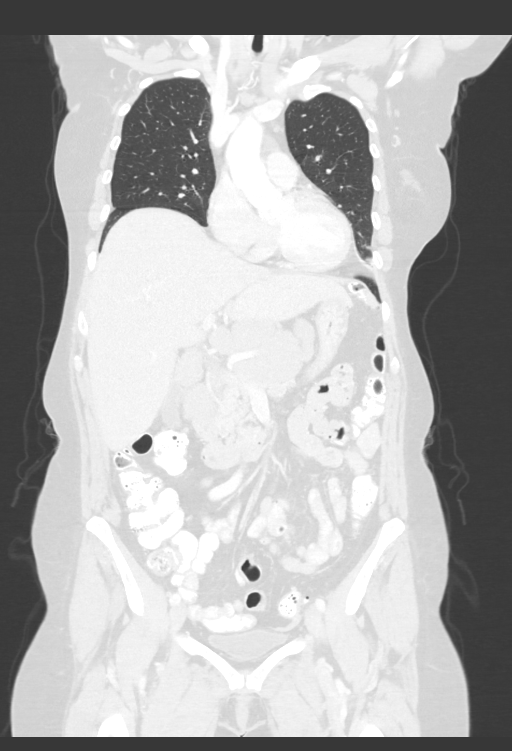
[im 72/120  lung]
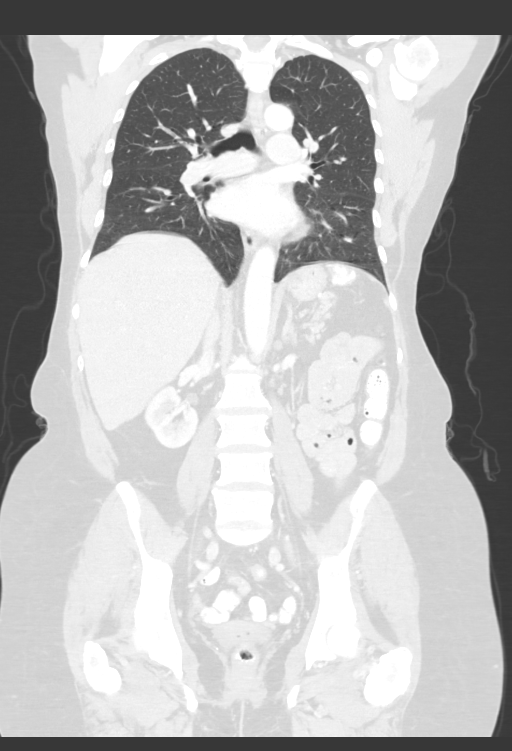

[12 of 36 positions shown; findings below may reference images not displayed]

FINDINGS: CT CHEST FINDINGS

Cardiovascular: The heart size is normal. No substantial pericardial
effusion. No thoracic aortic aneurysm. Atherosclerotic calcification
is noted in the wall of the thoracic aorta. Enlargement of the
pulmonary outflow tract and main pulmonary arteries suggests
pulmonary arterial hypertension.

Mediastinum/Nodes: Scattered small mediastinal lymph nodes evident
without lymphadenopathy. There is no hilar lymphadenopathy. The
esophagus has normal imaging features. There is no axillary
lymphadenopathy.

Lungs/Pleura: No suspicious pulmonary nodule or mass. No focal
airspace consolidation. No pleural effusion.

Musculoskeletal: No worrisome lytic or sclerotic osseous
abnormality.

CT ABDOMEN PELVIS FINDINGS

Hepatobiliary: No suspicious focal abnormality within the liver
parenchyma. Liver measures 22.5 cm craniocaudal length, enlarged.
There is no evidence for gallstones, gallbladder wall thickening, or
pericholecystic fluid. No intrahepatic or extrahepatic biliary
dilation.

Pancreas: No focal mass lesion. No dilatation of the main duct. No
intraparenchymal cyst. No peripancreatic edema.

Spleen: Prior splenectomy.

Adrenals/Urinary Tract: No adrenal nodule or mass. Tiny
hypoattenuating lesions in the right kidney are too small to
characterize but likely benign. Left kidney unremarkable. No
evidence for hydroureter. The urinary bladder appears normal for the
degree of distention.

Stomach/Bowel: Stomach is unremarkable. No gastric wall thickening.
No evidence of outlet obstruction. Duodenum is normally positioned
as is the ligament of Treitz. No small bowel wall thickening. No
small bowel dilatation. The terminal ileum is normal. The appendix
is normal. No gross colonic mass. No colonic wall thickening.
Diverticular changes are noted in the left colon without evidence of
diverticulitis.

Vascular/Lymphatic: There is abdominal aortic atherosclerosis
without aneurysm. Upper abdominal lymphadenopathy generates
mass-effect on the portal vein although the portal vein does remain
patent.

Tiny juxta cardiac and juxta diaphragmatic lymph nodes are evident.
Bulky lymphadenopathy is identified in the upper abdomen. Index
cm short axis gastrohepatic ligament lymph node is visible on 61/2.
Bulky portal caval node measuring 3.4 cm short axis is visible on
67/2. 2.7 cm short axis central mesenteric node is visible on 74/2.
Small retroperitoneal lymph nodes are seen in the upper abdomen. No
pelvic sidewall lymphadenopathy. No inguinal lymphadenopathy.

Reproductive: The uterus is unremarkable.  There is no adnexal mass.

Other: No intraperitoneal free fluid.

Musculoskeletal: No worrisome lytic or sclerotic osseous
abnormality.
IMPRESSION: 1. Bulky lymphadenopathy identified in the upper abdomen, compatible
with lymphoma. Although there are scattered tiny lymph nodes in the
chest, no lymphadenopathy by size criteria in the thorax or pelvis.
2. Hepatomegaly.
3. Prior splenectomy.
4. Enlargement of the pulmonary outflow tract and main pulmonary
arteries suggests pulmonary arterial hypertension.
5.  Aortic Atherosclerois ([id number])

## 2021-01-19 ENCOUNTER — Other Ambulatory Visit (HOSPITAL_COMMUNITY): Payer: Self-pay

## 2021-01-19 MED FILL — Acyclovir Cap 200 MG: ORAL | 30 days supply | Qty: 120 | Fill #0 | Status: AC

## 2021-01-20 ENCOUNTER — Other Ambulatory Visit (HOSPITAL_COMMUNITY): Payer: Self-pay

## 2021-01-29 ENCOUNTER — Encounter: Payer: Self-pay | Admitting: Hematology

## 2021-01-29 DIAGNOSIS — Z23 Encounter for immunization: Secondary | ICD-10-CM | POA: Diagnosis not present

## 2021-01-29 NOTE — Progress Notes (Signed)
Pt had 4th Covid booster today. Unable to add to chart at this time.

## 2021-02-10 ENCOUNTER — Encounter: Payer: Self-pay | Admitting: Hematology

## 2021-02-10 ENCOUNTER — Encounter: Payer: Self-pay | Admitting: *Deleted

## 2021-03-08 ENCOUNTER — Other Ambulatory Visit: Payer: Self-pay | Admitting: Physician Assistant

## 2021-03-08 ENCOUNTER — Telehealth: Payer: Self-pay | Admitting: Physician Assistant

## 2021-03-08 DIAGNOSIS — C8307 Small cell B-cell lymphoma, spleen: Secondary | ICD-10-CM

## 2021-03-08 NOTE — Telephone Encounter (Signed)
Changed 6/14 f/u appt to a mychart Visit due to provider not being in office. Called and left detailed message.

## 2021-03-09 ENCOUNTER — Inpatient Hospital Stay: Payer: Medicare Other

## 2021-03-09 ENCOUNTER — Inpatient Hospital Stay: Payer: Medicare Other | Attending: Hematology

## 2021-03-09 ENCOUNTER — Other Ambulatory Visit: Payer: Self-pay

## 2021-03-09 ENCOUNTER — Inpatient Hospital Stay (HOSPITAL_BASED_OUTPATIENT_CLINIC_OR_DEPARTMENT_OTHER): Payer: Medicare Other | Admitting: Physician Assistant

## 2021-03-09 ENCOUNTER — Ambulatory Visit: Payer: Medicare Other | Admitting: Hematology

## 2021-03-09 VITALS — BP 147/76 | HR 63 | Temp 98.2°F | Resp 18 | Ht 66.0 in | Wt 162.2 lb

## 2021-03-09 DIAGNOSIS — C8587 Other specified types of non-Hodgkin lymphoma, spleen: Secondary | ICD-10-CM | POA: Insufficient documentation

## 2021-03-09 DIAGNOSIS — C8307 Small cell B-cell lymphoma, spleen: Secondary | ICD-10-CM

## 2021-03-09 DIAGNOSIS — R0602 Shortness of breath: Secondary | ICD-10-CM | POA: Insufficient documentation

## 2021-03-09 DIAGNOSIS — Z7189 Other specified counseling: Secondary | ICD-10-CM

## 2021-03-09 DIAGNOSIS — Z79899 Other long term (current) drug therapy: Secondary | ICD-10-CM | POA: Insufficient documentation

## 2021-03-09 DIAGNOSIS — R252 Cramp and spasm: Secondary | ICD-10-CM | POA: Insufficient documentation

## 2021-03-09 DIAGNOSIS — R630 Anorexia: Secondary | ICD-10-CM | POA: Diagnosis not present

## 2021-03-09 DIAGNOSIS — Z5111 Encounter for antineoplastic chemotherapy: Secondary | ICD-10-CM | POA: Diagnosis not present

## 2021-03-09 LAB — CBC WITH DIFFERENTIAL (CANCER CENTER ONLY)
Abs Immature Granulocytes: 0.01 10*3/uL (ref 0.00–0.07)
Basophils Absolute: 0 10*3/uL (ref 0.0–0.1)
Basophils Relative: 1 %
Eosinophils Absolute: 0.2 10*3/uL (ref 0.0–0.5)
Eosinophils Relative: 6 %
HCT: 42 % (ref 36.0–46.0)
Hemoglobin: 14.2 g/dL (ref 12.0–15.0)
Immature Granulocytes: 0 %
Lymphocytes Relative: 30 %
Lymphs Abs: 1.3 10*3/uL (ref 0.7–4.0)
MCH: 30.2 pg (ref 26.0–34.0)
MCHC: 33.8 g/dL (ref 30.0–36.0)
MCV: 89.4 fL (ref 80.0–100.0)
Monocytes Absolute: 0.5 10*3/uL (ref 0.1–1.0)
Monocytes Relative: 12 %
Neutro Abs: 2.1 10*3/uL (ref 1.7–7.7)
Neutrophils Relative %: 51 %
Platelet Count: 309 10*3/uL (ref 150–400)
RBC: 4.7 MIL/uL (ref 3.87–5.11)
RDW: 13.7 % (ref 11.5–15.5)
WBC Count: 4.2 10*3/uL (ref 4.0–10.5)
nRBC: 0 % (ref 0.0–0.2)

## 2021-03-09 LAB — CMP (CANCER CENTER ONLY)
ALT: 17 U/L (ref 0–44)
AST: 19 U/L (ref 15–41)
Albumin: 3.7 g/dL (ref 3.5–5.0)
Alkaline Phosphatase: 83 U/L (ref 38–126)
Anion gap: 11 (ref 5–15)
BUN: 15 mg/dL (ref 8–23)
CO2: 24 mmol/L (ref 22–32)
Calcium: 9.1 mg/dL (ref 8.9–10.3)
Chloride: 107 mmol/L (ref 98–111)
Creatinine: 0.72 mg/dL (ref 0.44–1.00)
GFR, Estimated: >60 mL/min (ref >60)
Glucose, Bld: 91 mg/dL (ref 70–99)
Potassium: 4 mmol/L (ref 3.5–5.1)
Sodium: 142 mmol/L (ref 135–145)
Total Bilirubin: 1.1 mg/dL (ref 0.3–1.2)
Total Protein: 7.1 g/dL (ref 6.5–8.1)

## 2021-03-09 LAB — URIC ACID: Uric Acid, Serum: 5.3 mg/dL (ref 2.5–7.1)

## 2021-03-09 MED ORDER — MORPHINE SULFATE (PF) 2 MG/ML IV SOLN
2.0000 mg | Freq: Once | INTRAVENOUS | Status: AC
  Administered 2021-03-09: 2 mg via INTRAVENOUS

## 2021-03-09 MED ORDER — ACETAMINOPHEN 500 MG PO TABS
ORAL_TABLET | ORAL | Status: AC
  Filled 2021-03-09: qty 2

## 2021-03-09 MED ORDER — DIPHENHYDRAMINE HCL 50 MG/ML IJ SOLN
50.0000 mg | Freq: Once | INTRAMUSCULAR | Status: AC
  Administered 2021-03-09: 50 mg via INTRAVENOUS

## 2021-03-09 MED ORDER — SODIUM CHLORIDE 0.9 % IV SOLN: 8.0000 mg | Freq: Once | INTRAVENOUS | Status: DC

## 2021-03-09 MED ORDER — LORAZEPAM 1 MG PO TABS
0.5000 mg | ORAL_TABLET | Freq: Once | ORAL | Status: AC
Start: 2021-03-09 — End: 2021-03-09
  Administered 2021-03-09: 0.5 mg via ORAL

## 2021-03-09 MED ORDER — SODIUM CHLORIDE 0.9 % IV SOLN
Freq: Once | INTRAVENOUS | Status: AC
  Filled 2021-03-09: qty 250

## 2021-03-09 MED ORDER — ACETAMINOPHEN 500 MG PO TABS
1000.0000 mg | ORAL_TABLET | Freq: Once | ORAL | Status: AC
  Administered 2021-03-09: 1000 mg via ORAL

## 2021-03-09 MED ORDER — FAMOTIDINE 20 MG IN NS 100 ML IVPB
INTRAVENOUS | Status: AC
  Filled 2021-03-09: qty 100

## 2021-03-09 MED ORDER — LORAZEPAM 1 MG PO TABS
ORAL_TABLET | ORAL | Status: AC
  Filled 2021-03-09: qty 1

## 2021-03-09 MED ORDER — DIPHENHYDRAMINE HCL 50 MG/ML IJ SOLN
INTRAMUSCULAR | Status: AC
  Filled 2021-03-09: qty 1

## 2021-03-09 MED ORDER — ONDANSETRON HCL 4 MG/2ML IJ SOLN: 8.0000 mg | Freq: Once | INTRAMUSCULAR | Status: DC

## 2021-03-09 MED ORDER — ONDANSETRON HCL 4 MG/2ML IJ SOLN
INTRAMUSCULAR | Status: AC
  Filled 2021-03-09: qty 4

## 2021-03-09 MED ORDER — FAMOTIDINE IN NACL 20-0.9 MG/50ML-% IV SOLN
20.0000 mg | Freq: Once | INTRAVENOUS | Status: AC
  Administered 2021-03-09: 20 mg via INTRAVENOUS
  Filled 2021-03-09: qty 50

## 2021-03-09 MED ORDER — ONDANSETRON HCL 4 MG/2ML IJ SOLN
8.0000 mg | Freq: Once | INTRAMUSCULAR | Status: AC
  Administered 2021-03-09: 8 mg via INTRAVENOUS

## 2021-03-09 MED ORDER — SODIUM CHLORIDE 0.9 % IV SOLN
375.0000 mg/m2 | Freq: Once | INTRAVENOUS | Status: AC
  Administered 2021-03-09: 700 mg via INTRAVENOUS
  Filled 2021-03-09: qty 50

## 2021-03-09 MED ORDER — MONTELUKAST SODIUM 10 MG PO TABS
ORAL_TABLET | ORAL | Status: AC
  Filled 2021-03-09: qty 1

## 2021-03-09 MED ORDER — MORPHINE SULFATE (PF) 2 MG/ML IV SOLN
INTRAVENOUS | Status: AC
  Filled 2021-03-09: qty 1

## 2021-03-09 MED ORDER — MORPHINE SULFATE 4 MG/ML IJ SOLN
2.0000 mg | Freq: Once | INTRAMUSCULAR | Status: DC
  Filled 2021-03-09: qty 1

## 2021-03-09 MED ORDER — METHYLPREDNISOLONE SODIUM SUCC 125 MG IJ SOLR
125.0000 mg | Freq: Once | INTRAMUSCULAR | Status: AC
  Administered 2021-03-09: 125 mg via INTRAVENOUS

## 2021-03-09 MED ORDER — MONTELUKAST SODIUM 10 MG PO TABS
10.0000 mg | ORAL_TABLET | Freq: Once | ORAL | Status: AC
  Administered 2021-03-09: 10 mg via ORAL

## 2021-03-09 MED ORDER — METHYLPREDNISOLONE SODIUM SUCC 125 MG IJ SOLR
INTRAMUSCULAR | Status: AC
  Filled 2021-03-09: qty 2

## 2021-03-09 NOTE — Patient Instructions (Signed)
[facility] MEDICAL ONCOLOGY   Discharge Instructions: Thank you for choosing [facility] to provide your oncology and hematology care.   If you have a lab appointment with the Cancer Center, please go directly to the Cancer Center and check in at the registration area.   Wear comfortable clothing and clothing appropriate for easy access to any Portacath or PICC line.   We strive to give you quality time with your provider. You may need to reschedule your appointment if you arrive late (15 or more minutes).  Arriving late affects you and other patients whose appointments are after yours.  Also, if you miss three or more appointments without notifying the office, you may be dismissed from the clinic at the provider's discretion.      For prescription refill requests, have your pharmacy contact our office and allow 72 hours for refills to be completed.    Today you received the following chemotherapy and/or immunotherapy agents: Rituximab (Rituxan)      To help prevent nausea and vomiting after your treatment, we encourage you to take your nausea medication as directed.  BELOW ARE SYMPTOMS THAT SHOULD BE REPORTED IMMEDIATELY: *FEVER GREATER THAN 100.4 F (38 C) OR HIGHER *CHILLS OR SWEATING *NAUSEA AND VOMITING THAT IS NOT CONTROLLED WITH YOUR NAUSEA MEDICATION *UNUSUAL SHORTNESS OF BREATH *UNUSUAL BRUISING OR BLEEDING *URINARY PROBLEMS (pain or burning when urinating, or frequent urination) *BOWEL PROBLEMS (unusual diarrhea, constipation, pain near the anus) TENDERNESS IN MOUTH AND THROAT WITH OR WITHOUT PRESENCE OF ULCERS (sore throat, sores in mouth, or a toothache) UNUSUAL RASH, SWELLING OR PAIN  UNUSUAL VAGINAL DISCHARGE OR ITCHING   Items with * indicate a potential emergency and should be followed up as soon as possible or go to the Emergency Department if any problems should occur.  Please show the CHEMOTHERAPY ALERT CARD or IMMUNOTHERAPY ALERT CARD at  check-in to the Emergency Department and triage nurse.  Should you have questions after your visit or need to cancel or reschedule your appointment, please contact [facility]  Dept: [phone]  and follow the prompts.  Office hours are 8:00 a.m. to 4:30 p.m. Monday - Friday. Please note that voicemails left after 4:00 p.m. may not be returned until the following business day.  We are closed weekends and major holidays. You have access to a nurse at all times for urgent questions. Please call the main number to the clinic Dept: [phone] and follow the prompts.   For any non-urgent questions, you may also contact your provider using MyChart. We now offer e-Visits for anyone 18 and older to request care online for non-urgent symptoms. For details visit [email or web address].   Also download the MyChart app! Go to the app store, search "MyChart", open the app, select [facility], and log in with your MyChart username and password.  Due to Covid, a mask is required upon entering the hospital/clinic. If you do not have a mask, one will be given to you upon arrival. For doctor visits, patients may have 1 support person aged 18 or older with them. For treatment visits, patients cannot have anyone with them due to current Covid guidelines and our immunocompromised population.  

## 2021-03-09 NOTE — Progress Notes (Signed)
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 S/p splenectomy at [place] with Dr Shon Hough.  CURRENT TREATMENT:  Bimonthly Rituxan  INTERIM HISTORY:   Michelle Cohen returns today for a follow up prior to scheduled Rituxan infusion. Patient continues to do very well without any significant limitations. She reports stable energy levels. Patient tries to go for daily walks and volunteers at the healing garden at [employer]. Patient notes a mild appetite loss, mainly due to the weather. She denies any weight loss. She continues to supplement her diet with protein shakes. Patient denies any nausea, vomiting or abdominal pain. Her bowel movements are regular without any diarrhea or constipation. Patient denies easy bruising or signs of bleeding. She denies any fevers, chills, night sweats, lumps/bumps, shortness of breath, chest pain or cough. She has no other complaints.   MEDICAL HISTORY:  Past Medical History:  Diagnosis Date   B-cell lymphoma ([place]) 06/2017     SURGICAL HISTORY: Past Surgical History:  Procedure Laterality Date   left inguinal hernia repair Left    SPLENECTOMY, TOTAL  02/13/2018    SOCIAL HISTORY: Social History   Socioeconomic History   Marital status: Single    Spouse name: Not on file   Number of children: Not on file   Years of education: Not on file   Highest education level: Not on file  Occupational History   Not on file  Tobacco Use   Smoking status: Never   Smokeless tobacco: Never  Vaping Use   Vaping Use: Never used  Substance and Sexual Activity   Alcohol use: Yes    Comment: glass of wine occasionally    Drug use: No   Sexual activity: Not on file  Other Topics Concern   Not on file  Social History Narrative   Not on file   Social Determinants of Health   Financial Resource Strain: Not on file  Food Insecurity: Not on file  Transportation Needs: Not on file  Physical Activity: Not on file  Stress: Not on file  Social Connections: Not on file  Intimate Partner Violence: Not on file    FAMILY HISTORY: Sister - lung cancer Mother -Alzheimers dementia  ALLERGIES:  is allergic to allopurinol and tape.  MEDICATIONS:  Current Outpatient Medications  Medication Sig Dispense Refill   acetaminophen (TYLENOL) 500 MG tablet Take 500 mg by mouth every 8 (eight) hours as needed for moderate pain.      acyclovir (ZOVIRAX) 200 MG capsule TAKE 2 CAPSULES BY MOUTH TWICE DAILY 120 capsule 1   albuterol (PROVENTIL HFA;VENTOLIN HFA) 108 (90 Base) MCG/ACT inhaler Inhale 1-2 puffs into the lungs every 6 (six) hours as needed for wheezing or shortness of breath. 1 Inhaler 0   aspirin 81 MG EC tablet Take by mouth.     augmented betamethasone dipropionate (DIPROLENE-AF) 0.05 % cream SMARTSIG:Sparingly Topical Twice Daily PRN     benzonatate (TESSALON) 200 MG capsule Take 1 capsule (200 mg total) by mouth 3 (three) times daily as needed for cough. 30 capsule 0   chlorhexidine (PERIDEX) 0.12 % solution SWISH WITH 15 MLS IN MOUTH OR THROAT THEN SPIT --USE TWICE DAILY AS DIRECTED 473  mL 1   chlorpheniramine-HYDROcodone (TUSSIONEX PENNKINETIC ER) 10-8 MG/5ML SUER Take 5 mLs by mouth every 12 (twelve) hours as needed for cough. 60 mL 0   Cyanocobalamin (VITAMIN B 12 PO) Take by mouth.     Diphenhyd-Hydrocort-Nystatin (FIRST-DUKES MOUTHWASH MT) Take by mouth.     docusate sodium (COLACE) 100 MG capsule Take 100 mg by mouth 2 (two) times daily.     docusate sodium (COLACE) 50 MG capsule Take by mouth.     erythromycin ophthalmic ointment SMARTSIG:In Eye(s)     fluocinonide (LIDEX) 0.05 % external solution  SMARTSIG:1 Milliliter(s) Topical Every Night PRN     fluticasone (FLONASE) 50 MCG/ACT nasal spray Place 2 sprays into both nostrils daily. 16 g 0   folic acid (FOLVITE) 1 MG tablet Take by mouth.     hydrOXYzine (ATARAX/VISTARIL) 25 MG tablet Take 1-2 tablets (25-50 mg total) by mouth every 4 (four) hours as needed. 30 tablet 0   hydrOXYzine (VISTARIL) 25 MG capsule Take by mouth.     ketoconazole (NIZORAL) 2 % shampoo Apply 1 application topically 2 (two) times a week. 120 mL 1   Loratadine 10 MG CAPS Take 10 mg by mouth daily.     magic mouthwash w/lidocaine SOLN Take 5 mLs by mouth 4 (four) times daily as needed for mouth pain. Swish and spit 1 Part viscous lidocaine 2% 1 Part Maalox (do not substitute Kaopectate) 1 Part diphenhydramine 12.5 mg per 5 ml elixir. 120 mL 2   magic mouthwash w/lidocaine SOLN Take 5 mLs by mouth 4 (four) times daily as needed for mouth pain. Swish and spit 120 mL 0   moxifloxacin (VIGAMOX) 0.5 % ophthalmic solution      oxyCODONE (OXY IR/ROXICODONE) 5 MG immediate release tablet Take by mouth.     Prednisolon-Gatiflox-Bromfenac 1-0.5-0.075 % SUSP Place 1 drop into the right eye 4 (four) times daily.     prednisoLONE acetate (PRED FORTE) 1 % ophthalmic suspension      predniSONE (DELTASONE) 20 MG tablet Take 2 tablets (40 mg total) by mouth daily with breakfast. Starting 5 days prior to 1st dose of Rituxan 60 tablet 0   senna (SENOKOT) 8.6 MG TABS tablet Take 1 tablet by mouth daily as needed for mild constipation.      Spacer/Aero-Holding Chambers (AEROCHAMBER PLUS) inhaler Use as instructed 1 each 2   triamcinolone cream (KENALOG) 0.1 % Apply 1 application topically 2 (two) times daily. 45 g 0   No current facility-administered medications for this visit.    REVIEW OF SYSTEMS:   Review of Systems  Constitutional:  Negative for chills, fever, malaise/fatigue and weight loss.  HENT:  Negative for congestion, nosebleeds and sore throat.   Respiratory:   Negative for cough, hemoptysis, shortness of breath and wheezing.   Cardiovascular:  Negative for chest pain and leg swelling.  Gastrointestinal:  Negative for abdominal pain, blood in stool, constipation, diarrhea, melena, nausea and vomiting.  Genitourinary:  Negative for dysuria and hematuria.  Skin:  Negative for itching and rash.  Neurological:  Negative for dizziness, weakness and headaches.  Endo/Heme/Allergies:  Does not bruise/bleed easily.    PHYSICAL EXAMINATION:  Exam not performed due to telehealth visit.   There were no vitals taken for this visit.    LABORATORY DATA:   I have reviewed the data as listed   CBC Latest Ref Rng & Units 01/05/2021 11/03/2020 09/02/2020  WBC 4.0 - 10.5 K/uL 5.4 8.5 14.7(H)  Hemoglobin 12.0 - 15.0 g/dL 14.1  14.3 12.6  Hematocrit 36.0 - 46.0 % 42.6 42.7 37.0  Platelets 150 - 400 K/uL 262 372 319   CBC    Component Value Date/Time   WBC 5.4 01/05/2021 0947   WBC 8.5 11/03/2020 0850   RBC 4.71 01/05/2021 0947   HGB 14.1 01/05/2021 0947   HGB 9.1 (L) 09/14/2017 0835   HCT 42.6 01/05/2021 0947   HCT 35.2 03/05/2018 1331   HCT 29.0 (L) 09/14/2017 0835   PLT 262 01/05/2021 0947   PLT 46 (L) 09/14/2017 0835   MCV 90.4 01/05/2021 0947   MCV 92.1 09/14/2017 0835   MCH 29.9 01/05/2021 0947   MCHC 33.1 01/05/2021 0947   RDW 13.5 01/05/2021 0947   RDW 18.2 (H) 09/14/2017 0835   LYMPHSABS 1.4 01/05/2021 0947   LYMPHSABS 2.0 09/14/2017 0835   MONOABS 0.6 01/05/2021 0947   MONOABS 0.3 09/14/2017 0835   EOSABS 0.3 01/05/2021 0947   EOSABS 0.0 09/14/2017 0835   BASOSABS 0.1 01/05/2021 0947   BASOSABS 0.0 09/14/2017 0835    . CMP Latest Ref Rng & Units 01/05/2021 11/03/2020 09/02/2020  Glucose 70 - 99 mg/dL 91 86 85  BUN 8 - 23 mg/dL 14 12 11   Creatinine 0.44 - 1.00 mg/dL 0.73 0.76 0.72  Sodium 135 - 145 mmol/L 140 139 141  Potassium 3.5 - 5.1 mmol/L 4.1 3.9 3.9  Chloride 98 - 111 mmol/L 108 108 109  CO2 22 - 32 mmol/L 23 23 24   Calcium  8.9 - 10.3 mg/dL 9.0 9.1 9.0  Total Protein 6.5 - 8.1 g/dL 7.5 7.9 7.1  Total Bilirubin 0.3 - 1.2 mg/dL 1.2 1.0 1.0  Alkaline Phos 38 - 126 U/L 77 64 56  AST 15 - 41 U/L 23 17 16   ALT 0 - 44 U/L 19 16 12     ASSESSMENT & PLAN:   Michelle Cohen is a wonderful 70 y.o.  female who presents to our clinic to discuss ongoing management of the following:   1. Splenic marginal zone B-cell Stage IV  --Underwent total splenectomy on 02/23/2018  with Dr. Shon Hough at [facility].  --Currently on Rituximab bimonthly, due for treatment today.  --Labs from today 03/09/2021, were reviewed without any interventions required.  --Patient will complete her last Rituximab infusion today and return to see Dr. Delia Sitar Limbo on 06/07/2021 with labs.  2 Rituxan hypersensitivity reaction --Patient had some muscle cramping and mild SOB needing additional steroids, benadryl and Pepcid and Albuterol. -Patient receives infusion at lower rate and given extensive pre-meds. Patient would not be a candidate for rapid infusion protocol and would need to keep the next infusion at the tolerated rate and not rate escalate.  3.  Oral mucositis/HSV outbreak  -- resolved --continue with acyclovir as needed    All of the patient's questions were answered with apparent satisfaction. The patient knows to call the clinic with any problems, questions or concerns.  I have spent a total of 25 minutes minutes of non-face-to-face time, preparing to see the patient, obtaining and/or reviewing separately obtained history, counseling and educating the patient, documenting clinical information in the electronic health record, and care coordination.   Lincoln Brigham PA-C Hematology and Oncology [facility] [place] P: [phone]

## 2021-03-10 ENCOUNTER — Encounter: Payer: Self-pay | Admitting: Physician Assistant

## 2021-03-20 ENCOUNTER — Other Ambulatory Visit (HOSPITAL_COMMUNITY): Payer: Self-pay

## 2021-03-20 MED FILL — Chlorhexidine Gluconate Soln 0.12%: OROMUCOSAL | 30 days supply | Qty: 473 | Fill #0 | Status: AC

## 2021-03-22 ENCOUNTER — Other Ambulatory Visit: Payer: Self-pay | Admitting: Hematology

## 2021-03-22 ENCOUNTER — Other Ambulatory Visit (HOSPITAL_COMMUNITY): Payer: Self-pay

## 2021-03-22 MED ORDER — FLUOCINONIDE 0.05 % EX SOLN
CUTANEOUS | 2 refills | Status: AC
  Filled 2021-03-22: qty 60, 30d supply, fill #0

## 2021-03-23 ENCOUNTER — Other Ambulatory Visit (HOSPITAL_COMMUNITY): Payer: Self-pay

## 2021-03-23 ENCOUNTER — Encounter: Payer: Self-pay | Admitting: Hematology

## 2021-03-24 ENCOUNTER — Other Ambulatory Visit (HOSPITAL_COMMUNITY): Payer: Self-pay

## 2021-03-24 ENCOUNTER — Other Ambulatory Visit: Payer: Self-pay

## 2021-03-24 MED ORDER — ACYCLOVIR 200 MG PO CAPS
ORAL_CAPSULE | Freq: Two times a day (BID) | ORAL | 1 refills | Status: DC
  Filled 2021-03-24: qty 120, 30d supply, fill #0
  Filled 2021-05-28: qty 120, 30d supply, fill #1

## 2021-04-26 ENCOUNTER — Encounter: Payer: Self-pay | Admitting: Hematology

## 2021-05-05 DIAGNOSIS — Z0000 Encounter for general adult medical examination without abnormal findings: Secondary | ICD-10-CM | POA: Diagnosis not present

## 2021-05-05 DIAGNOSIS — Z13220 Encounter for screening for lipoid disorders: Secondary | ICD-10-CM | POA: Diagnosis not present

## 2021-05-05 DIAGNOSIS — Z1211 Encounter for screening for malignant neoplasm of colon: Secondary | ICD-10-CM | POA: Diagnosis not present

## 2021-05-05 DIAGNOSIS — J309 Allergic rhinitis, unspecified: Secondary | ICD-10-CM | POA: Diagnosis not present

## 2021-05-05 DIAGNOSIS — Z23 Encounter for immunization: Secondary | ICD-10-CM | POA: Diagnosis not present

## 2021-05-05 DIAGNOSIS — Z136 Encounter for screening for cardiovascular disorders: Secondary | ICD-10-CM | POA: Diagnosis not present

## 2021-05-10 DIAGNOSIS — Z1211 Encounter for screening for malignant neoplasm of colon: Secondary | ICD-10-CM | POA: Diagnosis not present

## 2021-05-29 ENCOUNTER — Other Ambulatory Visit (HOSPITAL_COMMUNITY): Payer: Self-pay

## 2021-06-04 ENCOUNTER — Other Ambulatory Visit: Payer: Self-pay

## 2021-06-04 DIAGNOSIS — C8307 Small cell B-cell lymphoma, spleen: Secondary | ICD-10-CM

## 2021-06-07 ENCOUNTER — Inpatient Hospital Stay: Payer: Medicare Other

## 2021-06-07 ENCOUNTER — Inpatient Hospital Stay: Payer: Medicare Other | Attending: Hematology | Admitting: Hematology

## 2021-06-07 ENCOUNTER — Other Ambulatory Visit: Payer: Self-pay

## 2021-06-07 VITALS — BP 143/70 | HR 58 | Temp 98.4°F | Resp 18 | Ht 66.0 in | Wt 160.7 lb

## 2021-06-07 DIAGNOSIS — Z79899 Other long term (current) drug therapy: Secondary | ICD-10-CM | POA: Diagnosis not present

## 2021-06-07 DIAGNOSIS — Z8572 Personal history of non-Hodgkin lymphomas: Secondary | ICD-10-CM | POA: Diagnosis not present

## 2021-06-07 DIAGNOSIS — R161 Splenomegaly, not elsewhere classified: Secondary | ICD-10-CM | POA: Insufficient documentation

## 2021-06-07 DIAGNOSIS — C8307 Small cell B-cell lymphoma, spleen: Secondary | ICD-10-CM

## 2021-06-07 DIAGNOSIS — G4730 Sleep apnea, unspecified: Secondary | ICD-10-CM | POA: Diagnosis not present

## 2021-06-07 DIAGNOSIS — K460 Unspecified abdominal hernia with obstruction, without gangrene: Secondary | ICD-10-CM | POA: Diagnosis not present

## 2021-06-07 LAB — CMP (CANCER CENTER ONLY)
ALT: 19 U/L (ref 0–44)
AST: 17 U/L (ref 15–41)
Albumin: 4.1 g/dL (ref 3.5–5.0)
Alkaline Phosphatase: 86 U/L (ref 38–126)
Anion gap: 10 (ref 5–15)
BUN: 14 mg/dL (ref 8–23)
CO2: 26 mmol/L (ref 22–32)
Calcium: 9.7 mg/dL (ref 8.9–10.3)
Chloride: 105 mmol/L (ref 98–111)
Creatinine: 0.78 mg/dL (ref 0.44–1.00)
GFR, Estimated: >60 mL/min
Glucose, Bld: 94 mg/dL (ref 70–99)
Potassium: 4.7 mmol/L (ref 3.5–5.1)
Sodium: 141 mmol/L (ref 135–145)
Total Bilirubin: 1.4 mg/dL — ABNORMAL HIGH (ref 0.3–1.2)
Total Protein: 7.6 g/dL (ref 6.5–8.1)

## 2021-06-07 LAB — CBC WITH DIFFERENTIAL (CANCER CENTER ONLY)
Abs Immature Granulocytes: 0.01 10*3/uL (ref 0.00–0.07)
Basophils Absolute: 0.1 10*3/uL (ref 0.0–0.1)
Basophils Relative: 1 %
Eosinophils Absolute: 0.3 10*3/uL (ref 0.0–0.5)
Eosinophils Relative: 5 %
HCT: 44.7 % (ref 36.0–46.0)
Hemoglobin: 15.3 g/dL — ABNORMAL HIGH (ref 12.0–15.0)
Immature Granulocytes: 0 %
Lymphocytes Relative: 32 %
Lymphs Abs: 1.8 10*3/uL (ref 0.7–4.0)
MCH: 30.9 pg (ref 26.0–34.0)
MCHC: 34.2 g/dL (ref 30.0–36.0)
MCV: 90.3 fL (ref 80.0–100.0)
Monocytes Absolute: 0.7 10*3/uL (ref 0.1–1.0)
Monocytes Relative: 12 %
Neutro Abs: 2.8 10*3/uL (ref 1.7–7.7)
Neutrophils Relative %: 50 %
Platelet Count: 363 10*3/uL (ref 150–400)
RBC: 4.95 MIL/uL (ref 3.87–5.11)
RDW: 14.5 % (ref 11.5–15.5)
WBC Count: 5.6 10*3/uL (ref 4.0–10.5)
nRBC: 0 % (ref 0.0–0.2)

## 2021-06-07 LAB — LACTATE DEHYDROGENASE: LDH: 157 U/L (ref 98–192)

## 2021-06-07 LAB — URIC ACID: Uric Acid, Serum: 5.3 mg/dL (ref 2.5–7.1)

## 2021-06-08 ENCOUNTER — Telehealth: Payer: Self-pay | Admitting: Hematology

## 2021-06-08 NOTE — Telephone Encounter (Signed)
Left message with follow-up appointment per 9/12 los.

## 2021-06-13 ENCOUNTER — Encounter: Payer: Self-pay | Admitting: Hematology

## 2021-06-13 NOTE — Progress Notes (Signed)
HEMATOLOGY/ONCOLOGY FOLLOW UP NOTE  Date of Service: .06/07/2021  Patient Care Team: Alroy Dust, L.Marlou Sa, MD as PCP - General (Family Medicine)  CHIEF COMPLAINTS F/u for continued management of SMZL   HISTORY OF PRESENTING ILLNESS:   Michelle Cohen is a wonderful 70 y.o. female who has been referred to [place] from [facility] for evaluation and management of B-cell lymphoma. She presents to her appointment today with her partner of 21 years. She states that she moved to the area ~2 years ago secondary to her sister's diagnosis of lung cancer (unfortunately, now passed) and taking care of her mother's Alzheimer's. Generally, prior to this issue she has been very healthy and without chronic medical conditions and not on chronic medical therapies/treatments.   Initially, the patient was admitted to [facility] for abdominal pain and significant hernia. She reports that she was visiting in [place] and while flying there she had an acute onset of sweats and left lower quadrant pain. She reports that her hernia was not present at that time, but as she continued to fly her hernia had become more noticeable and enlarged in her lower abdomen. Additionally, her partner reports that her episode of hydrosis was very significant, stating that it was pooling below her. On their arrival to [facility] on 06/21/17, a CT A/P was performed which showed an incarcerated hernia. Of note, her CT showed massive splenomegaly at 32cm in greatest dimension. Manual reduction of her hernia was attempted while in the ED but was unsuccessful. Pre-operative notes state that following intubation manual reduction was performed successfully and hernia was repaired laparoscopically. CT CAP was performed as well which was without the presence of additional surrounding LAD. She did have flow cytometry performed during her admission as well which showed B-cell lymphocytes which were monoclonal. A bone marrow biopsy was  also examined which showed that it was markedly hypercellular (~95%) with prominent interstitial infiltrate of small lymphocytes.   Prior to her diagnosis, she mostly felt typically well overall. She states that she did suffer from insomnia which she related to over-thinking at the time regarding her familial situation. She did experience some fatigue and weight loss with this as well. Her partner reports that she noticed her weight loss approximately 87moago, which she quantifies at approximately 30lbs since onset. Towards the end of this six month periods and just prior to the appearance of her hernia the patient began to notice some bothersome abdominal distension. She also noticed some significant fatigue and she would nap for longer periods following her daily workouts. Additionally, her partner would notice that she would have issues with breathing and this appeared irregular and more labored, especially at night while laying down. She also mentions that she did experience intermittent night sweats throughout this six month period as well. She denies lightheadedness, dizziness, abdominal pain, back pain, fever, chills, abnormal bruising/bleeding, bloody stools, epistaxis, hematuria, gingival bleeding, or other bleeding issues within this period. She has not received any blood transfusions and has not previously had a tattoo; she is unconcerned with prior HepC exposure.   Since her hernia surgery, she reports that she feels great symptomatically overall and much improved. She does have some issues with abdominal bloating and discomfort, but this has been manageable. No overt abdominal pain.   Throughout all of this, she has attempted to try to remain within good spirits. Losing her sister recently has been hard, but she has been working through it with her family; however, her  partner voices that they do not have strong social support in this area as most of their support is in [place] where they  previously lived. She has been working through her sister's death with grief counseling which has been helping her.    She has never previously smoked, but she reports some moderate second-hand smoke through her father at a younger age. Her father did die of a PE, but she is unaware of any other diagnoses of clotting disorders or cancers within her family. No h/o alcohol abuse, chemical/radiation exposures. She worked previously in Industrial/product designer.   PREVIOUS TREATMENT:  Rituximab weekly x4 beginning on 07/21/17-08/11/17 Received additional Rituxan weekly x 4 doses Rituximab  Weekly for 4 weeks starting 10/05/17 - completed 10/2017. S/p splenectomy at [place] with Dr Shon Hough.  CURRENT TREATMENT:  Bimonthly Rituxan  INTERIM HISTORY:   Michelle Cohen returns today for scheduled follow up of her SMZL. We are joined today by her wife, Caren Griffins. The patient's last visit with [place] was on 11/03/2020 2022. The pt reports that she is doing well overall.   The pt reports no new symptoms or concerns.  She [facility] and has been eating well and staying physically active. No new fevers chills night sweats unexpected weight loss new lumps or bumps new shortness of breath no abdominal pain or distention.  Lab results today 06/07/2021 show normal CBC/differential, unremarkable CMP and uric acid level of 5.3 with an LDH of 157.  On review of systems, pt denies any symptoms.  MEDICAL HISTORY:  Past Medical History:  Diagnosis Date   B-cell lymphoma ([place]) 06/2017     SURGICAL HISTORY: Past Surgical History:  Procedure Laterality Date   left inguinal hernia repair Left    SPLENECTOMY, TOTAL  02/13/2018    SOCIAL HISTORY: Social History   Socioeconomic History   Marital status: Single    Spouse name: Not on file   Number of children: Not on file   Years of education: Not on file   Highest education level: Not on file  Occupational  History   Not on file  Tobacco Use   Smoking status: Never   Smokeless tobacco: Never  Vaping Use   Vaping Use: Never used  Substance and Sexual Activity   Alcohol use: Yes    Comment: glass of wine occasionally   Drug use: No   Sexual activity: Not on file  Other Topics Concern   Not on file  Social History Narrative   Not on file   Social Determinants of Health   Financial Resource Strain: Not on file  Food Insecurity: Not on file  Transportation Needs: Not on file  Physical Activity: Not on file  Stress: Not on file  Social Connections: Not on file  Intimate Partner Violence: Not on file    FAMILY HISTORY: Sister - lung cancer Mother -Alzheimers dementia  ALLERGIES:  is allergic to allopurinol and tape.  MEDICATIONS:  Current Outpatient Medications  Medication Sig Dispense Refill   acetaminophen (TYLENOL) 500 MG tablet Take 500 mg by mouth every 8 (eight) hours as needed for moderate pain.      acyclovir (ZOVIRAX) 200 MG capsule TAKE 2 CAPSULES BY MOUTH TWICE DAILY 120 capsule 1   albuterol (PROVENTIL HFA;VENTOLIN HFA) 108 (90 Base) MCG/ACT inhaler Inhale 1-2 puffs into the lungs every 6 (six) hours as needed for wheezing or shortness of breath. 1 Inhaler 0   aspirin 81 MG  EC tablet Take by mouth.     augmented betamethasone dipropionate (DIPROLENE-AF) 0.05 % cream SMARTSIG:Sparingly Topical Twice Daily PRN     benzonatate (TESSALON) 200 MG capsule Take 1 capsule (200 mg total) by mouth 3 (three) times daily as needed for cough. 30 capsule 0   chlorhexidine (PERIDEX) 0.12 % solution SWISH WITH 15 MLS IN MOUTH OR THROAT THEN SPIT --USE TWICE DAILY AS DIRECTED 473 mL 1   chlorpheniramine-HYDROcodone (TUSSIONEX PENNKINETIC ER) 10-8 MG/5ML SUER Take 5 mLs by mouth every 12 (twelve) hours as needed for cough. 60 mL 0   Cyanocobalamin (VITAMIN B 12 PO) Take by mouth.     Diphenhyd-Hydrocort-Nystatin (FIRST-DUKES MOUTHWASH MT) Take by mouth.     docusate sodium (COLACE) 100  MG capsule Take 100 mg by mouth 2 (two) times daily.     docusate sodium (COLACE) 50 MG capsule Take by mouth.     erythromycin ophthalmic ointment SMARTSIG:In Eye(s)     fluocinonide (LIDEX) 0.05 % external solution SMARTSIG:1 Milliliter(s) Topical Every Night PRN     fluocinonide (LIDEX) 0.05 % external solution Apply 1 ml to the affected skin nightly as needed for inflammation 60 mL 2   fluticasone (FLONASE) 50 MCG/ACT nasal spray Place 2 sprays into both nostrils daily. 16 g 0   folic acid (FOLVITE) 1 MG tablet Take by mouth.     hydrOXYzine (ATARAX/VISTARIL) 25 MG tablet Take 1-2 tablets (25-50 mg total) by mouth every 4 (four) hours as needed. 30 tablet 0   hydrOXYzine (VISTARIL) 25 MG capsule Take by mouth.     ketoconazole (NIZORAL) 2 % shampoo Apply 1 application topically 2 (two) times a week. 120 mL 1   Loratadine 10 MG CAPS Take 10 mg by mouth daily.     magic mouthwash w/lidocaine SOLN Take 5 mLs by mouth 4 (four) times daily as needed for mouth pain. Swish and spit 1 Part viscous lidocaine 2% 1 Part Maalox (do not substitute Kaopectate) 1 Part diphenhydramine 12.5 mg per 5 ml elixir. 120 mL 2   magic mouthwash w/lidocaine SOLN Take 5 mLs by mouth 4 (four) times daily as needed for mouth pain. Swish and spit 120 mL 0   moxifloxacin (VIGAMOX) 0.5 % ophthalmic solution      oxyCODONE (OXY IR/ROXICODONE) 5 MG immediate release tablet Take by mouth.     Prednisolon-Gatiflox-Bromfenac 1-0.5-0.075 % SUSP Place 1 drop into the right eye 4 (four) times daily.     prednisoLONE acetate (PRED FORTE) 1 % ophthalmic suspension      predniSONE (DELTASONE) 20 MG tablet Take 2 tablets (40 mg total) by mouth daily with breakfast. Starting 5 days prior to 1st dose of Rituxan 60 tablet 0   senna (SENOKOT) 8.6 MG TABS tablet Take 1 tablet by mouth daily as needed for mild constipation.      Spacer/Aero-Holding Chambers (AEROCHAMBER PLUS) inhaler Use as instructed 1 each 2   triamcinolone cream  (KENALOG) 0.1 % Apply 1 application topically 2 (two) times daily. 45 g 0   No current facility-administered medications for this visit.    REVIEW OF SYSTEMS:   .10 Point review of Systems was done is negative except as noted above.   PHYSICAL EXAMINATION:  ECOG PERFORMANCE STATUS: 1 BP (!) 143/70 (BP Location: Right Arm, Patient Position: Sitting)   Pulse (!) 58   Temp 98.4 F (36.9 C) (Oral)   Resp 18   Ht '5\' 6"'  (1.676 m)   Wt 160 lb 11.2 oz (72.9 kg)  SpO2 100%   BMI 25.94 kg/m    NAD . GENERAL:alert, in no acute distress and comfortable SKIN: no acute rashes, no significant lesions EYES: conjunctiva are pink and non-injected, sclera anicteric OROPHARYNX: MMM, no exudates, no oropharyngeal erythema or ulceration NECK: supple, no JVD LYMPH:  no palpable lymphadenopathy in the cervical, axillary or inguinal regions LUNGS: clear to auscultation b/l with normal respiratory effort HEART: regular rate & rhythm ABDOMEN:  normoactive bowel sounds , non tender, not distended. Extremity: no pedal edema PSYCH: alert & oriented x 3 with fluent speech NEURO: no focal motor/sensory deficits   LABORATORY DATA:   I have reviewed the data as listed   CBC Latest Ref Rng & Units 06/07/2021 03/09/2021 01/05/2021  WBC 4.0 - 10.5 K/uL 5.6 4.2 5.4  Hemoglobin 12.0 - 15.0 g/dL 15.3(H) 14.2 14.1  Hematocrit 36.0 - 46.0 % 44.7 42.0 42.6  Platelets 150 - 400 K/uL 363 309 262  ANC 4.1k . CBC    Component Value Date/Time   WBC 5.6 06/07/2021 1335   WBC 8.5 11/03/2020 0850   RBC 4.95 06/07/2021 1335   HGB 15.3 (H) 06/07/2021 1335   HGB 9.1 (L) 09/14/2017 0835   HCT 44.7 06/07/2021 1335   HCT 35.2 03/05/2018 1331   HCT 29.0 (L) 09/14/2017 0835   PLT 363 06/07/2021 1335   PLT 46 (L) 09/14/2017 0835   MCV 90.3 06/07/2021 1335   MCV 92.1 09/14/2017 0835   MCH 30.9 06/07/2021 1335   MCHC 34.2 06/07/2021 1335   RDW 14.5 06/07/2021 1335   RDW 18.2 (H) 09/14/2017 0835   LYMPHSABS 1.8  06/07/2021 1335   LYMPHSABS 2.0 09/14/2017 0835   MONOABS 0.7 06/07/2021 1335   MONOABS 0.3 09/14/2017 0835   EOSABS 0.3 06/07/2021 1335   EOSABS 0.0 09/14/2017 0835   BASOSABS 0.1 06/07/2021 1335   BASOSABS 0.0 09/14/2017 0835    . CMP Latest Ref Rng & Units 06/07/2021 03/09/2021 01/05/2021  Glucose 70 - 99 mg/dL 94 91 91  BUN 8 - 23 mg/dL '14 15 14  ' Creatinine 0.44 - 1.00 mg/dL 0.78 0.72 0.73  Sodium 135 - 145 mmol/L 141 142 140  Potassium 3.5 - 5.1 mmol/L 4.7 4.0 4.1  Chloride 98 - 111 mmol/L 105 107 108  CO2 22 - 32 mmol/L '26 24 23  ' Calcium 8.9 - 10.3 mg/dL 9.7 9.1 9.0  Total Protein 6.5 - 8.1 g/dL 7.6 7.1 7.5  Total Bilirubin 0.3 - 1.2 mg/dL 1.4(H) 1.1 1.2  Alkaline Phos 38 - 126 U/L 86 83 77  AST 15 - 41 U/L '17 19 23  ' ALT 0 - 44 U/L '19 17 19   ' Component     Latest Ref Rng & Units 07/13/2017  LDH     125 - 245 U/L 232  Hep C Virus Ab     0.0 - 0.9 s/co ratio <0.1  HIV Screen 4th Generation wRfx     Non Reactive Non Reactive  Hep B Core Ab, Tot     Negative Negative  Hepatitis B Surface Ag     Negative Negative   Component     Latest Ref Rng & Units 07/28/2017  LDH     125 - 245 U/L 200  Uric Acid, Serum     2.6 - 7.4 mg/dl 6.0  Phosphorus     2.5 - 4.5 mg/dL 3.8   03/05/18 Pathology Report Splenectomy:      RADIOGRAPHIC STUDIES: I have personally reviewed the radiological images as  listed and agreed with the findings in the report. No results found.   ASSESSMENT & PLAN:   Breonna Gafford is a wonderful 70 y.o. 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  Her clinical  presentation is also representative of this and that this may have been present over months to possibly years.  -With her pattern of involvement and results thus far it is likely that she has splenic marginal zone type lymphoma.  -PET scan on 07/25/2017 with results showing: Massive splenomegaly, splenic volume 5770 cubic cm, with homogeneous low grade activity throughout the spleen equal to that of the physiologic activity in the liver, and above the background blood pool activity in the mediastinum. No pathologic adenopathy identified. No bony involvement noted.   -CT A/p on 08/23/17 with results of: Marked splenomegaly without substantial interval change in splenic size/volume. Borderline lymphadenopathy in the hepatoduodenal ligament. Aortic Atherosclerosis. I discussed this with the patient in great detail.  -CT A/p on 09/15/17 shows significant splenomegaly shows minimal smaller change in size, with no evidence of bowel obstruction.  -12/31/2019 CT C/A/P ([id number]) ([id number]) revealed "1. 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 Although there are scattered tiny lymph nodes in the chest, no lymphadenopathy by size criteria in the thorax or pelvis. 2. Hepatomegaly. 3. Prior splenectomy. 4. Enlargement of the pulmonary outflow tract and main pulmonary arteries suggests pulmonary arterial hypertension. 5.  Aortic Atherosclerois (ICD10-170.0)."   2. Pancytopenia s/p thrombocytopenia and anemia  Due to SMZL and hypersplenism Now resolved  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 Had to complete Rituxan at lower rate. Patient would not be a candidate for rapid infusion protocol and would need to keep the next infusion at the tolerated rate and not rate escalate. Plan  Completed with extensive pre-medications.  4. Oral mucositis/HSV  outbreak - resolved -continue with acyclovir as needed   PLAN: -Discussed pt labwork today, 06/07/2021; blood counts and chemistries all completely normal. Uric acid normal. LDH WNL. -Discussed Evusheld and pt's would like to hold off at this time. -No lab or clinical evidence of SMZL progression at this time.  -Active surveillance currently under for her splenic marginal zone lymphoma -Will see back in 4 months with labs.   FOLLOW UP: Return to clinic with Dr. Irene Limbo with labs in 4 months  . The total time spent in the appointment was 20 minutes and more than 50% was on counseling and direct patient cares.   All of the patient's questions were answered with apparent satisfaction. The patient knows to call the clinic with any problems, questions or concerns.   Sullivan Lone MD [place] AAHIVMS [facility] Hematology/Oncology Physician [facility]

## 2021-06-22 DIAGNOSIS — Z23 Encounter for immunization: Secondary | ICD-10-CM | POA: Diagnosis not present

## 2021-06-25 ENCOUNTER — Encounter: Payer: Self-pay | Admitting: Hematology

## 2021-07-07 DIAGNOSIS — H353132 Nonexudative age-related macular degeneration, bilateral, intermediate dry stage: Secondary | ICD-10-CM | POA: Diagnosis not present

## 2021-07-07 DIAGNOSIS — H04123 Dry eye syndrome of bilateral lacrimal glands: Secondary | ICD-10-CM | POA: Diagnosis not present

## 2021-07-07 DIAGNOSIS — H52203 Unspecified astigmatism, bilateral: Secondary | ICD-10-CM | POA: Diagnosis not present

## 2021-07-07 DIAGNOSIS — H43813 Vitreous degeneration, bilateral: Secondary | ICD-10-CM | POA: Diagnosis not present

## 2021-07-12 ENCOUNTER — Encounter: Payer: Self-pay | Admitting: Hematology

## 2021-07-12 DIAGNOSIS — Z23 Encounter for immunization: Secondary | ICD-10-CM | POA: Diagnosis not present

## 2021-07-23 ENCOUNTER — Other Ambulatory Visit (HOSPITAL_COMMUNITY): Payer: Self-pay

## 2021-07-23 ENCOUNTER — Other Ambulatory Visit: Payer: Self-pay | Admitting: Hematology

## 2021-07-23 MED ORDER — ACYCLOVIR 200 MG PO CAPS
ORAL_CAPSULE | Freq: Two times a day (BID) | ORAL | 1 refills | Status: DC
  Filled 2021-07-23: qty 120, 30d supply, fill #0
  Filled 2021-10-01: qty 120, 30d supply, fill #1

## 2021-10-01 ENCOUNTER — Other Ambulatory Visit (HOSPITAL_COMMUNITY): Payer: Self-pay

## 2021-10-05 ENCOUNTER — Other Ambulatory Visit: Payer: Self-pay

## 2021-10-05 DIAGNOSIS — C8307 Small cell B-cell lymphoma, spleen: Secondary | ICD-10-CM

## 2021-10-06 ENCOUNTER — Other Ambulatory Visit: Payer: Self-pay

## 2021-10-06 ENCOUNTER — Inpatient Hospital Stay: Payer: Medicare Other | Attending: Hematology | Admitting: Hematology

## 2021-10-06 ENCOUNTER — Inpatient Hospital Stay: Payer: Medicare Other

## 2021-10-06 VITALS — BP 133/80 | HR 53 | Temp 97.5°F | Resp 17 | Wt 164.8 lb

## 2021-10-06 DIAGNOSIS — Z79899 Other long term (current) drug therapy: Secondary | ICD-10-CM | POA: Insufficient documentation

## 2021-10-06 DIAGNOSIS — C8587 Other specified types of non-Hodgkin lymphoma, spleen: Secondary | ICD-10-CM | POA: Diagnosis not present

## 2021-10-06 DIAGNOSIS — C8307 Small cell B-cell lymphoma, spleen: Secondary | ICD-10-CM

## 2021-10-06 LAB — CBC WITH DIFFERENTIAL (CANCER CENTER ONLY)
Abs Immature Granulocytes: 0.01 10*3/uL (ref 0.00–0.07)
Basophils Absolute: 0.1 10*3/uL (ref 0.0–0.1)
Basophils Relative: 1 %
Eosinophils Absolute: 0.3 10*3/uL (ref 0.0–0.5)
Eosinophils Relative: 4 %
HCT: 43.2 % (ref 36.0–46.0)
Hemoglobin: 14.8 g/dL (ref 12.0–15.0)
Immature Granulocytes: 0 %
Lymphocytes Relative: 25 %
Lymphs Abs: 1.6 10*3/uL (ref 0.7–4.0)
MCH: 31.4 pg (ref 26.0–34.0)
MCHC: 34.3 g/dL (ref 30.0–36.0)
MCV: 91.7 fL (ref 80.0–100.0)
Monocytes Absolute: 0.6 10*3/uL (ref 0.1–1.0)
Monocytes Relative: 9 %
Neutro Abs: 3.9 10*3/uL (ref 1.7–7.7)
Neutrophils Relative %: 61 %
Platelet Count: 354 10*3/uL (ref 150–400)
RBC: 4.71 MIL/uL (ref 3.87–5.11)
RDW: 13.3 % (ref 11.5–15.5)
WBC Count: 6.3 10*3/uL (ref 4.0–10.5)
nRBC: 0 % (ref 0.0–0.2)

## 2021-10-06 LAB — CMP (CANCER CENTER ONLY)
ALT: 18 U/L (ref 0–44)
AST: 19 U/L (ref 15–41)
Albumin: 4.1 g/dL (ref 3.5–5.0)
Alkaline Phosphatase: 57 U/L (ref 38–126)
Anion gap: 7 (ref 5–15)
BUN: 14 mg/dL (ref 8–23)
CO2: 27 mmol/L (ref 22–32)
Calcium: 8.9 mg/dL (ref 8.9–10.3)
Chloride: 105 mmol/L (ref 98–111)
Creatinine: 0.66 mg/dL (ref 0.44–1.00)
GFR, Estimated: >60 mL/min (ref >60)
Glucose, Bld: 92 mg/dL (ref 70–99)
Potassium: 4.3 mmol/L (ref 3.5–5.1)
Sodium: 139 mmol/L (ref 135–145)
Total Bilirubin: 1.3 mg/dL — ABNORMAL HIGH (ref 0.3–1.2)
Total Protein: 7 g/dL (ref 6.5–8.1)

## 2021-10-06 LAB — LACTATE DEHYDROGENASE: LDH: 116 U/L (ref 98–192)

## 2021-10-06 LAB — URIC ACID: Uric Acid, Serum: 4.7 mg/dL (ref 2.5–7.1)

## 2021-10-07 ENCOUNTER — Telehealth: Payer: Self-pay | Admitting: Hematology

## 2021-10-07 NOTE — Telephone Encounter (Signed)
Left message with follow-up appointment per 11/1 los. 

## 2021-10-12 ENCOUNTER — Encounter: Payer: Self-pay | Admitting: Hematology

## 2021-10-12 NOTE — Progress Notes (Signed)
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 Completed maintenance Rituxan  S/p splenectomy at [facility] with Dr Shon Hough.  CURRENT TREATMENT:  Clinical surveillance  INTERIM HISTORY:   Michelle Cohen is here for her 69-monthfollow-up for splenic marginal zone lymphoma.  She notes that she has been doing well and has no acute new concerns. No fevers no chills no night sweats no unexpected weight loss. No infection issues. No new skin rashes. Has been staying very physically active and walking 3 to 5 miles a day. She is accompanied by her wife for this clinic visit. Labs today were reviewed with the patient - stable.    MEDICAL HISTORY:  Past Medical History:  Diagnosis Date   B-cell lymphoma ([place] 06/2017     SURGICAL HISTORY: Past Surgical History:  Procedure Laterality Date   left inguinal hernia repair Left    SPLENECTOMY, TOTAL  02/13/2018    SOCIAL HISTORY: Social History   Socioeconomic History   Marital status: Single    Spouse name: Not on file   Number of children: Not on file   Years of education: Not on file   Highest education level: Not on file  Occupational History   Not on file  Tobacco Use   Smoking status: Never   Smokeless tobacco: Never  Vaping Use   Vaping Use: Never used  Substance and Sexual Activity   Alcohol use: Yes    Comment: glass of wine occasionally   Drug use: No   Sexual activity: Not on file  Other Topics Concern   Not on file  Social History Narrative    Not on file   Social Determinants of Health   Financial Resource Strain: Not on file  Food Insecurity: Not on file  Transportation Needs: Not on file  Physical Activity: Not on file  Stress: Not on file  Social Connections: Not on file  Intimate Partner Violence: Not on file    FAMILY HISTORY: Sister - lung cancer Mother -Alzheimers dementia  ALLERGIES:  is allergic to allopurinol and tape.  MEDICATIONS:  Current Outpatient Medications  Medication Sig Dispense Refill   acetaminophen (TYLENOL) 500 MG tablet Take 500 mg by mouth every 8 (eight) hours as needed for moderate pain.      acyclovir (ZOVIRAX) 200 MG capsule TAKE 2 CAPSULES BY MOUTH TWICE DAILY 120 capsule 1   albuterol (PROVENTIL HFA;VENTOLIN HFA) 108 (90 Base) MCG/ACT inhaler Inhale 1-2 puffs into the lungs every 6 (six) hours as needed for wheezing or shortness of breath. 1 Inhaler 0   aspirin 81 MG EC tablet Take by mouth.     augmented betamethasone dipropionate (DIPROLENE-AF) 0.05 % cream SMARTSIG:Sparingly Topical Twice Daily PRN     benzonatate (TESSALON) 200 MG capsule Take 1 capsule (200 mg total) by mouth 3 (three) times daily as needed for cough. 30 capsule 0   chlorpheniramine-HYDROcodone (TUSSIONEX PENNKINETIC ER) 10-8 MG/5ML SUER Take 5 mLs by mouth every 12 (twelve) hours as needed for cough. 60 mL 0   Cyanocobalamin (VITAMIN B 12 PO) Take by mouth.  Diphenhyd-Hydrocort-Nystatin (FIRST-DUKES MOUTHWASH MT) Take by mouth.     docusate sodium (COLACE) 100 MG capsule Take 100 mg by mouth 2 (two) times daily.     docusate sodium (COLACE) 50 MG capsule Take by mouth.     erythromycin ophthalmic ointment SMARTSIG:In Eye(s)     fluocinonide (LIDEX) 0.05 % external solution SMARTSIG:1 Milliliter(s) Topical Every Night PRN     fluocinonide (LIDEX) 0.05 % external solution Apply 1 ml to the affected skin nightly as needed for inflammation 60 mL 2   fluticasone (FLONASE) 50 MCG/ACT nasal spray Place 2 sprays into  both nostrils daily. 16 g 0   folic acid (FOLVITE) 1 MG tablet Take by mouth.     hydrOXYzine (ATARAX/VISTARIL) 25 MG tablet Take 1-2 tablets (25-50 mg total) by mouth every 4 (four) hours as needed. 30 tablet 0   hydrOXYzine (VISTARIL) 25 MG capsule Take by mouth.     ketoconazole (NIZORAL) 2 % shampoo Apply 1 application topically 2 (two) times a week. 120 mL 1   Loratadine 10 MG CAPS Take 10 mg by mouth daily.     magic mouthwash w/lidocaine SOLN Take 5 mLs by mouth 4 (four) times daily as needed for mouth pain. Swish and spit 1 Part viscous lidocaine 2% 1 Part Maalox (do not substitute Kaopectate) 1 Part diphenhydramine 12.5 mg per 5 ml elixir. 120 mL 2   magic mouthwash w/lidocaine SOLN Take 5 mLs by mouth 4 (four) times daily as needed for mouth pain. Swish and spit 120 mL 0   moxifloxacin (VIGAMOX) 0.5 % ophthalmic solution      oxyCODONE (OXY IR/ROXICODONE) 5 MG immediate release tablet Take by mouth.     Prednisolon-Gatiflox-Bromfenac 1-0.5-0.075 % SUSP Place 1 drop into the right eye 4 (four) times daily.     prednisoLONE acetate (PRED FORTE) 1 % ophthalmic suspension      predniSONE (DELTASONE) 20 MG tablet Take 2 tablets (40 mg total) by mouth daily with breakfast. Starting 5 days prior to 1st dose of Rituxan 60 tablet 0   senna (SENOKOT) 8.6 MG TABS tablet Take 1 tablet by mouth daily as needed for mild constipation.      Spacer/Aero-Holding Chambers (AEROCHAMBER PLUS) inhaler Use as instructed 1 each 2   triamcinolone cream (KENALOG) 0.1 % Apply 1 application topically 2 (two) times daily. 45 g 0   No current facility-administered medications for this visit.    REVIEW OF SYSTEMS:   .10 Point review of Systems was done is negative except as noted above.    PHYSICAL EXAMINATION:  ECOG PERFORMANCE STATUS: 1 BP 133/80 (BP Location: Left Arm, Patient Position: Sitting)    Pulse (!) 53    Temp (!) 97.5 F (36.4 C) (Temporal)    Resp 17    Wt 164 lb 12.8 oz (74.8 kg)    SpO2 99%     BMI 26.60 kg/m   . GENERAL:alert, in no acute distress and comfortable SKIN: no acute rashes, no significant lesions EYES: conjunctiva are pink and non-injected, sclera anicteric OROPHARYNX: MMM, no exudates, no oropharyngeal erythema or ulceration NECK: supple, no JVD LYMPH:  no palpable lymphadenopathy in the cervical, axillary or inguinal regions LUNGS: clear to auscultation b/l with normal respiratory effort HEART: regular rate & rhythm ABDOMEN:  normoactive bowel sounds , non tender, not distended. Extremity: no pedal edema PSYCH: alert & oriented x 3 with fluent speech NEURO: no focal motor/sensory deficits    LABORATORY DATA:   I have reviewed the data  as listed   CBC Latest Ref Rng & Units 10/06/2021 06/07/2021 03/09/2021  WBC 4.0 - 10.5 K/uL 6.3 5.6 4.2  Hemoglobin 12.0 - 15.0 g/dL 14.8 15.3(H) 14.2  Hematocrit 36.0 - 46.0 % 43.2 44.7 42.0  Platelets 150 - 400 K/uL 354 363 309   CBC    Component Value Date/Time   WBC 6.3 10/06/2021 1418   WBC 8.5 11/03/2020 0850   RBC 4.71 10/06/2021 1418   HGB 14.8 10/06/2021 1418   HGB 9.1 (L) 09/14/2017 0835   HCT 43.2 10/06/2021 1418   HCT 35.2 03/05/2018 1331   HCT 29.0 (L) 09/14/2017 0835   PLT 354 10/06/2021 1418   PLT 46 (L) 09/14/2017 0835   MCV 91.7 10/06/2021 1418   MCV 92.1 09/14/2017 0835   MCH 31.4 10/06/2021 1418   MCHC 34.3 10/06/2021 1418   RDW 13.3 10/06/2021 1418   RDW 18.2 (H) 09/14/2017 0835   LYMPHSABS 1.6 10/06/2021 1418   LYMPHSABS 2.0 09/14/2017 0835   MONOABS 0.6 10/06/2021 1418   MONOABS 0.3 09/14/2017 0835   EOSABS 0.3 10/06/2021 1418   EOSABS 0.0 09/14/2017 0835   BASOSABS 0.1 10/06/2021 1418   BASOSABS 0.0 09/14/2017 0835    . CMP Latest Ref Rng & Units 10/06/2021 06/07/2021 03/09/2021  Glucose 70 - 99 mg/dL 92 94 91  BUN 8 - 23 mg/dL '14 14 15  ' Creatinine 0.44 - 1.00 mg/dL 0.66 0.78 0.72  Sodium 135 - 145 mmol/L 139 141 142  Potassium 3.5 - 5.1 mmol/L 4.3 4.7 4.0  Chloride 98 - 111  mmol/L 105 105 107  CO2 22 - 32 mmol/L '27 26 24  ' Calcium 8.9 - 10.3 mg/dL 8.9 9.7 9.1  Total Protein 6.5 - 8.1 g/dL 7.0 7.6 7.1  Total Bilirubin 0.3 - 1.2 mg/dL 1.3(H) 1.4(H) 1.1  Alkaline Phos 38 - 126 U/L 57 86 83  AST 15 - 41 U/L '19 17 19  ' ALT 0 - 44 U/L '18 19 17   ' . Lab Results  Component Value Date   LDH 116 10/06/2021     03/05/18 Pathology Report Splenectomy:      RADIOGRAPHIC STUDIES: I have personally reviewed the radiological images as listed and agreed with the findings in the report. No results found.   ASSESSMENT & PLAN:   Adah Stoneberg is a wonderful 71 y.o. 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  Her clinical presentation is also representative of this and that this may have been present over months to possibly years.  -With her pattern of involvement and results thus far it is likely that she has splenic marginal zone type lymphoma.  -PET scan on 07/25/2017 with results showing: Massive splenomegaly, splenic volume 5770 cubic cm, with homogeneous low grade activity throughout the spleen equal to that of the physiologic activity in the liver, and above the background blood pool activity in the mediastinum. No pathologic adenopathy identified. No bony involvement noted.   -CT A/p on 08/23/17 with results of: Marked splenomegaly without substantial interval change in splenic size/volume. Borderline lymphadenopathy in the hepatoduodenal ligament. Aortic Atherosclerosis. I discussed this with the patient in great detail.  -  CT A/p on 09/15/17 shows significant splenomegaly shows minimal  smaller change in size, with no evidence of bowel obstruction.  -12/31/2019 CT C/A/P ([id number]) ([id number]) revealed "1. 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 Although there are scattered tiny lymph nodes in the chest, no lymphadenopathy by size criteria in the thorax or pelvis. 2. Hepatomegaly. 3. Prior splenectomy. 4. Enlargement of the pulmonary outflow tract and main pulmonary arteries suggests pulmonary arterial hypertension. 5.  Aortic Atherosclerois (ICD10-170.0)."   2. Pancytopenia s/p thrombocytopenia and anemia  Due to SMZL and hypersplenism Now resolved  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 Had to complete Rituxan at lower rate. Patient would not be a candidate for rapid infusion protocol and would need to keep the next infusion at the tolerated rate and not rate escalate. Plan  Completed with extensive pre-medications.  4. Oral mucositis/HSV outbreak - resolved -continue with acyclovir as needed   PLAN: -Discussed labwork from today 10/06/2021-CBC, CMP and LDH within normal limits. -Patient has no clinical or lab evidence of progression of her splenic marginal zone lymphoma at this time. -No indication for additional work-up or treatment for her SMZL currently. -We shall continue active surveillance and see her back in 6 months with labs. -Patient is aware of constitutional symptoms to monitor for and call us if any other changes arise.   FOLLOW UP: Return to clinic with Dr. Irene Limbo with labs in 6 months  All of the patient's questions were answered with apparent satisfaction. The patient knows to call the clinic with any problems, questions or concerns.   Sullivan Lone MD [place] AAHIVMS [facility] Hematology/Oncology Physician [facility]

## 2021-11-08 DIAGNOSIS — E7800 Pure hypercholesterolemia, unspecified: Secondary | ICD-10-CM | POA: Diagnosis not present

## 2021-12-03 ENCOUNTER — Other Ambulatory Visit: Payer: Self-pay | Admitting: Hematology

## 2021-12-03 ENCOUNTER — Other Ambulatory Visit (HOSPITAL_COMMUNITY): Payer: Self-pay

## 2021-12-03 MED ORDER — ACYCLOVIR 200 MG PO CAPS
ORAL_CAPSULE | Freq: Two times a day (BID) | ORAL | 1 refills | Status: AC
  Filled 2021-12-03: qty 120, 30d supply, fill #0
  Filled 2022-04-29: qty 120, 30d supply, fill #1

## 2021-12-06 ENCOUNTER — Other Ambulatory Visit (HOSPITAL_COMMUNITY): Payer: Self-pay

## 2022-01-24 ENCOUNTER — Encounter: Payer: Self-pay | Admitting: Hematology

## 2022-03-02 ENCOUNTER — Other Ambulatory Visit: Payer: Self-pay | Admitting: Oncology

## 2022-03-30 ENCOUNTER — Telehealth: Payer: Self-pay | Admitting: Hematology

## 2022-03-30 NOTE — Telephone Encounter (Signed)
Rescheduled upcoming appointment due to provider's PAL. Patient is aware of changes. ?

## 2022-04-05 ENCOUNTER — Inpatient Hospital Stay: Payer: Medicare Other

## 2022-04-05 ENCOUNTER — Inpatient Hospital Stay: Payer: Medicare Other | Admitting: Hematology

## 2022-04-29 ENCOUNTER — Other Ambulatory Visit: Payer: Self-pay | Admitting: Hematology

## 2022-04-29 ENCOUNTER — Other Ambulatory Visit (HOSPITAL_COMMUNITY): Payer: Self-pay

## 2022-04-29 MED ORDER — CHLORHEXIDINE GLUCONATE 0.12 % MT SOLN
OROMUCOSAL | 1 refills | Status: AC
  Filled 2022-04-29: qty 473, 15d supply, fill #0

## 2022-05-06 ENCOUNTER — Inpatient Hospital Stay (HOSPITAL_BASED_OUTPATIENT_CLINIC_OR_DEPARTMENT_OTHER): Payer: Medicare Other | Admitting: Hematology

## 2022-05-06 ENCOUNTER — Inpatient Hospital Stay: Payer: Medicare Other | Attending: Hematology

## 2022-05-06 ENCOUNTER — Other Ambulatory Visit: Payer: Self-pay | Admitting: *Deleted

## 2022-05-06 ENCOUNTER — Other Ambulatory Visit: Payer: Self-pay

## 2022-05-06 VITALS — BP 140/77 | HR 54 | Temp 97.9°F | Resp 20 | Wt 164.3 lb

## 2022-05-06 DIAGNOSIS — D1721 Benign lipomatous neoplasm of skin and subcutaneous tissue of right arm: Secondary | ICD-10-CM

## 2022-05-06 DIAGNOSIS — I700 Atherosclerosis of aorta: Secondary | ICD-10-CM | POA: Insufficient documentation

## 2022-05-06 DIAGNOSIS — C8300 Small cell B-cell lymphoma, unspecified site: Secondary | ICD-10-CM | POA: Diagnosis not present

## 2022-05-06 DIAGNOSIS — C8307 Small cell B-cell lymphoma, spleen: Secondary | ICD-10-CM | POA: Diagnosis not present

## 2022-05-06 DIAGNOSIS — M7989 Other specified soft tissue disorders: Secondary | ICD-10-CM | POA: Diagnosis not present

## 2022-05-06 DIAGNOSIS — Z79899 Other long term (current) drug therapy: Secondary | ICD-10-CM | POA: Diagnosis not present

## 2022-05-06 LAB — CBC WITH DIFFERENTIAL (CANCER CENTER ONLY)
Abs Immature Granulocytes: 0.02 10*3/uL (ref 0.00–0.07)
Basophils Absolute: 0.1 10*3/uL (ref 0.0–0.1)
Basophils Relative: 1 %
Eosinophils Absolute: 0.5 10*3/uL (ref 0.0–0.5)
Eosinophils Relative: 9 %
HCT: 44 % (ref 36.0–46.0)
Hemoglobin: 15 g/dL (ref 12.0–15.0)
Immature Granulocytes: 0 %
Lymphocytes Relative: 27 %
Lymphs Abs: 1.5 10*3/uL (ref 0.7–4.0)
MCH: 31.5 pg (ref 26.0–34.0)
MCHC: 34.1 g/dL (ref 30.0–36.0)
MCV: 92.4 fL (ref 80.0–100.0)
Monocytes Absolute: 0.7 10*3/uL (ref 0.1–1.0)
Monocytes Relative: 13 %
Neutro Abs: 2.9 10*3/uL (ref 1.7–7.7)
Neutrophils Relative %: 50 %
Platelet Count: 301 10*3/uL (ref 150–400)
RBC: 4.76 MIL/uL (ref 3.87–5.11)
RDW: 13 % (ref 11.5–15.5)
Smear Review: NORMAL
WBC Count: 5.7 10*3/uL (ref 4.0–10.5)
nRBC: 0 % (ref 0.0–0.2)

## 2022-05-06 LAB — CMP (CANCER CENTER ONLY)
ALT: 17 U/L (ref 0–44)
AST: 16 U/L (ref 15–41)
Albumin: 4.1 g/dL (ref 3.5–5.0)
Alkaline Phosphatase: 56 U/L (ref 38–126)
Anion gap: 5 (ref 5–15)
BUN: 18 mg/dL (ref 8–23)
CO2: 28 mmol/L (ref 22–32)
Calcium: 9.2 mg/dL (ref 8.9–10.3)
Chloride: 107 mmol/L (ref 98–111)
Creatinine: 0.67 mg/dL (ref 0.44–1.00)
GFR, Estimated: >60 mL/min (ref >60)
Glucose, Bld: 89 mg/dL (ref 70–99)
Potassium: 4.6 mmol/L (ref 3.5–5.1)
Sodium: 140 mmol/L (ref 135–145)
Total Bilirubin: 1.3 mg/dL — ABNORMAL HIGH (ref 0.3–1.2)
Total Protein: 6.5 g/dL (ref 6.5–8.1)

## 2022-05-06 LAB — URIC ACID: Uric Acid, Serum: 5.6 mg/dL (ref 2.5–7.1)

## 2022-05-06 LAB — LACTATE DEHYDROGENASE: LDH: 191 U/L (ref 98–192)

## 2022-05-09 ENCOUNTER — Telehealth: Payer: Self-pay | Admitting: Hematology

## 2022-05-09 NOTE — Telephone Encounter (Signed)
Left message with follow-up appointment per 8/11 los.

## 2022-05-12 ENCOUNTER — Encounter: Payer: Self-pay | Admitting: Hematology

## 2022-05-12 NOTE — Progress Notes (Signed)
HEMATOLOGY/ONCOLOGY FOLLOW UP NOTE  Date of Service: .05/06/2022  Patient Care Team: Alroy Dust, L.Marlou Sa, MD as PCP - General (Family Medicine)  CHIEF COMPLAINTS Follow-up for continued evaluation and management of splenic marginal zone lymphoma  HISTORY OF PRESENTING ILLNESS:  Please see previous note for details of initial presentation   PREVIOUS TREATMENT:  Rituximab weekly x4 beginning on 07/21/17-08/11/17 Received additional Rituxan weekly x 4 doses Rituximab  Weekly for 4 weeks starting 10/05/17 - completed 10/2017. Completed maintenance Rituxan  S/p splenectomy at [facility] with Dr Shon Hough.  CURRENT TREATMENT:  Clinical surveillance  INTERIM HISTORY:   Michelle Cohen is here for her scheduled follow-up of her splenic marginal zone lymphoma. She is accompanied by her wife for this clinic visit. She notes that she has been doing well and trying to stay physically active.  She has also picked up some tai chi. Notes no acute new symptoms since her last clinic visit. Fevers no chills no night sweats no unexpected weight loss. No new lumps or bumps. No new skin rashes. Reports mild anxiety prior to each clinic follow-up however her labs look. Her wife has been having some issues with dizziness which are being evaluated.  Lab done today were discussed in detail with her.   MEDICAL HISTORY:  Past Medical History:  Diagnosis Date   B-cell lymphoma ([place]) 06/2017     SURGICAL HISTORY: Past Surgical History:  Procedure Laterality Date   left inguinal hernia repair Left    SPLENECTOMY, TOTAL  02/13/2018    SOCIAL HISTORY: Social History   Socioeconomic History   Marital status: Single    Spouse name: Not on file   Number of children: Not on file   Years of education: Not on file   Highest education level: Not on file  Occupational History   Not on file  Tobacco Use   Smoking status: Never   Smokeless tobacco: Never  Vaping Use   Vaping Use: Never used   Substance and Sexual Activity   Alcohol use: Yes    Comment: glass of wine occasionally   Drug use: No   Sexual activity: Not on file  Other Topics Concern   Not on file  Social History Narrative   Not on file   Social Determinants of Health   Financial Resource Strain: Not on file  Food Insecurity: Not on file  Transportation Needs: Not on file  Physical Activity: Not on file  Stress: Not on file  Social Connections: Not on file  Intimate Partner Violence: Not on file    FAMILY HISTORY: Sister - lung cancer Mother -Alzheimers dementia  ALLERGIES:  is allergic to allopurinol and tape.  MEDICATIONS:  Current Outpatient Medications  Medication Sig Dispense Refill   acetaminophen (TYLENOL) 500 MG tablet Take 500 mg by mouth every 8 (eight) hours as needed for moderate pain.      acyclovir (ZOVIRAX) 200 MG capsule TAKE 2 CAPSULES BY MOUTH TWICE DAILY 120 capsule 1   albuterol (PROVENTIL HFA;VENTOLIN HFA) 108 (90 Base) MCG/ACT inhaler Inhale 1-2 puffs into the lungs every 6 (six) hours as needed for wheezing or shortness of breath. 1 Inhaler 0   aspirin 81 MG EC tablet Take by mouth.     augmented betamethasone dipropionate (DIPROLENE-AF) 0.05 % cream SMARTSIG:Sparingly Topical Twice Daily PRN     benzonatate (TESSALON) 200 MG capsule Take 1 capsule (200 mg total) by mouth 3 (three) times daily as needed for cough. 30 capsule 0   chlorhexidine (PERIDEX)  0.12 % solution SWISH WITH 15 MLS IN MOUTH OR THROAT THEN SPIT --USE TWICE DAILY AS DIRECTED 473 mL 1   chlorpheniramine-HYDROcodone (TUSSIONEX PENNKINETIC ER) 10-8 MG/5ML SUER Take 5 mLs by mouth every 12 (twelve) hours as needed for cough. 60 mL 0   Cyanocobalamin (VITAMIN B 12 PO) Take by mouth.     Diphenhyd-Hydrocort-Nystatin (FIRST-DUKES MOUTHWASH MT) Take by mouth.     docusate sodium (COLACE) 100 MG capsule Take 100 mg by mouth 2 (two) times daily.     erythromycin ophthalmic ointment SMARTSIG:In Eye(s)     fluocinonide  (LIDEX) 0.05 % external solution Apply 1 ml to the affected skin nightly as needed for inflammation 60 mL 2   fluticasone (FLONASE) 50 MCG/ACT nasal spray Place 2 sprays into both nostrils daily. 16 g 0   folic acid (FOLVITE) 1 MG tablet Take by mouth.     hydrOXYzine (ATARAX/VISTARIL) 25 MG tablet Take 1-2 tablets (25-50 mg total) by mouth every 4 (four) hours as needed. 30 tablet 0   ketoconazole (NIZORAL) 2 % shampoo Apply 1 application topically 2 (two) times a week. 120 mL 1   Loratadine 10 MG CAPS Take 10 mg by mouth daily.     magic mouthwash w/lidocaine SOLN Take 5 mLs by mouth 4 (four) times daily as needed for mouth pain. Swish and spit 120 mL 0   moxifloxacin (VIGAMOX) 0.5 % ophthalmic solution      oxyCODONE (OXY IR/ROXICODONE) 5 MG immediate release tablet Take by mouth.     Prednisolon-Gatiflox-Bromfenac 1-0.5-0.075 % SUSP Place 1 drop into the right eye 4 (four) times daily.     prednisoLONE acetate (PRED FORTE) 1 % ophthalmic suspension      predniSONE (DELTASONE) 20 MG tablet Take 2 tablets (40 mg total) by mouth daily with breakfast. Starting 5 days prior to 1st dose of Rituxan 60 tablet 0   senna (SENOKOT) 8.6 MG TABS tablet Take 1 tablet by mouth daily as needed for mild constipation.      Spacer/Aero-Holding Chambers (AEROCHAMBER PLUS) inhaler Use as instructed 1 each 2   triamcinolone cream (KENALOG) 0.1 % Apply 1 application topically 2 (two) times daily. 45 g 0   No current facility-administered medications for this visit.    REVIEW OF SYSTEMS:   10 Point Review of Systems Was Done Is Negative except As Noted above.  PHYSICAL EXAMINATION:  ECOG PERFORMANCE STATUS: 1 BP (!) 140/77   Pulse (!) 54   Temp 97.9 F (36.6 C)   Resp 20   Wt 164 lb 4.8 oz (74.5 kg)   SpO2 99%   BMI 26.52 kg/m   . GENERAL:alert, in no acute distress and comfortable SKIN: no acute rashes, no significant lesions EYES: conjunctiva are pink and non-injected, sclera anicteric OROPHARYNX:  MMM, no exudates, no oropharyngeal erythema or ulceration NECK: supple, no JVD LYMPH:  no palpable lymphadenopathy in the cervical, axillary or inguinal regions LUNGS: clear to auscultation b/l with normal respiratory effort HEART: regular rate & rhythm ABDOMEN:  normoactive bowel sounds , non tender, not distended. Extremity: no pedal edema, right arm chronic swelling likely lipoma PSYCH: alert & oriented x 3 with fluent speech NEURO: no focal motor/sensory deficits     LABORATORY DATA:   I have reviewed the data as listed      Latest Ref Rng & Units 05/06/2022   11:19 AM 10/06/2021    2:18 PM 06/07/2021    1:35 PM  CBC  WBC 4.0 - 10.5 K/uL  5.7  6.3  5.6   Hemoglobin 12.0 - 15.0 g/dL 15.0  14.8  15.3   Hematocrit 36.0 - 46.0 % 44.0  43.2  44.7   Platelets 150 - 400 K/uL 301  354  363    CBC    Component Value Date/Time   WBC 5.7 05/06/2022 1119   WBC 8.5 11/03/2020 0850   RBC 4.76 05/06/2022 1119   HGB 15.0 05/06/2022 1119   HGB 9.1 (L) 09/14/2017 0835   HCT 44.0 05/06/2022 1119   HCT 35.2 03/05/2018 1331   HCT 29.0 (L) 09/14/2017 0835   PLT 301 05/06/2022 1119   PLT 46 (L) 09/14/2017 0835   MCV 92.4 05/06/2022 1119   MCV 92.1 09/14/2017 0835   MCH 31.5 05/06/2022 1119   MCHC 34.1 05/06/2022 1119   RDW 13.0 05/06/2022 1119   RDW 18.2 (H) 09/14/2017 0835   LYMPHSABS 1.5 05/06/2022 1119   LYMPHSABS 2.0 09/14/2017 0835   MONOABS 0.7 05/06/2022 1119   MONOABS 0.3 09/14/2017 0835   EOSABS 0.5 05/06/2022 1119   EOSABS 0.0 09/14/2017 0835   BASOSABS 0.1 05/06/2022 1119   BASOSABS 0.0 09/14/2017 0835    .    Latest Ref Rng & Units 05/06/2022   11:19 AM 10/06/2021    2:18 PM 06/07/2021    1:35 PM  CMP  Glucose 70 - 99 mg/dL 89  92  94   BUN 8 - 23 mg/dL '18  14  14   ' Creatinine 0.44 - 1.00 mg/dL 0.67  0.66  0.78   Sodium 135 - 145 mmol/L 140  139  141   Potassium 3.5 - 5.1 mmol/L 4.6  4.3  4.7   Chloride 98 - 111 mmol/L 107  105  105   CO2 22 - 32 mmol/L '28  27   26   ' Calcium 8.9 - 10.3 mg/dL 9.2  8.9  9.7   Total Protein 6.5 - 8.1 g/dL 6.5  7.0  7.6   Total Bilirubin 0.3 - 1.2 mg/dL 1.3  1.3  1.4   Alkaline Phos 38 - 126 U/L 56  57  86   AST 15 - 41 U/L '16  19  17   ' ALT 0 - 44 U/L '17  18  19    ' . Lab Results  Component Value Date   LDH 191 05/06/2022     03/05/18 Pathology Report Splenectomy:      RADIOGRAPHIC STUDIES: I have personally reviewed the radiological images as listed and agreed with the findings in the report. No results found.   ASSESSMENT & PLAN:   Michelle Cohen is a wonderful 71 y.o. 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  Her clinical presentation is also representative of this and that this may have been present over months to possibly years.  -With her pattern of involvement and results thus far it is likely that she has splenic marginal zone type lymphoma.  -PET scan on 07/25/2017 with results showing: Massive splenomegaly, splenic volume 5770 cubic cm, with homogeneous low grade activity throughout the spleen equal to that of the physiologic activity in the liver, and above the background  blood pool activity in the mediastinum. No pathologic adenopathy identified. No bony involvement noted.   -CT A/p on 08/23/17 with results of: Marked splenomegaly without substantial interval change in splenic size/volume. Borderline lymphadenopathy in the hepatoduodenal ligament. Aortic Atherosclerosis. I discussed this with the patient in great detail.  -CT A/p on 09/15/17 shows significant splenomegaly shows minimal smaller change in size, with no  evidence of bowel obstruction.  -12/31/2019 CT C/A/P ([id number]) ([id number]) revealed "1. 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 Although there are scattered tiny lymph nodes in the chest, no lymphadenopathy by size criteria in the thorax or pelvis. 2. Hepatomegaly. 3. Prior splenectomy. 4. Enlargement of the pulmonary outflow tract and main pulmonary arteries suggests pulmonary arterial hypertension. 5.  Aortic Atherosclerois (ICD10-170.0)."   2. Pancytopenia s/p thrombocytopenia and anemia  Due to SMZL and hypersplenism Now resolved  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 Had to complete Rituxan at lower rate. Patient would not be a candidate for rapid infusion protocol and would need to keep the next infusion at the tolerated rate and not rate escalate. Plan  Completed with extensive pre-medications.  4. Oral mucositis/HSV outbreak - resolved -continue with acyclovir as needed  5.  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 CBC within normal limits CMP stable LDH within normal limits at 191 Patient has new signs or symptoms suggestive of lymphoma progression at this time. -Discussed that she currently has no lab or clinical evidence of progression of her splenic marginal zone lymphoma. No indication for additional work-up or treatment of her SMZL at this time. -She is aware of symptoms to monitor for and inform us. -We will refer her to Dr. Ninfa Linden at [facility] surgery for consideration of resection of likely lipoma in her right arm.  FOLLOW UP: Referral to [place] surgery Dr Ninfa Linden for rt arm Lipoma resection RTC with Dr Irene Limbo with labs in 6 months  The total time spent in the appointment was 20 minutes*.  All of the patient's questions were  answered with apparent satisfaction. The patient knows to call the clinic with any problems, questions or concerns.   Sullivan Lone MD MS AAHIVMS [facility] Hematology/Oncology Physician [facility]  .*Total Encounter Time as defined by the Centers for Medicare and Medicaid Services includes, in addition to the face-to-face time of a patient visit (documented in the note above) non-face-to-face time: obtaining and reviewing outside history, ordering and reviewing medications, tests or procedures, care coordination (communications with other health care professionals or caregivers) and documentation in the medical record.

## 2022-05-20 DIAGNOSIS — Z23 Encounter for immunization: Secondary | ICD-10-CM | POA: Diagnosis not present

## 2022-05-23 ENCOUNTER — Encounter: Payer: Self-pay | Admitting: Hematology

## 2022-05-31 ENCOUNTER — Other Ambulatory Visit: Payer: Self-pay | Admitting: Family Medicine

## 2022-05-31 DIAGNOSIS — Z0000 Encounter for general adult medical examination without abnormal findings: Secondary | ICD-10-CM | POA: Diagnosis not present

## 2022-05-31 DIAGNOSIS — J309 Allergic rhinitis, unspecified: Secondary | ICD-10-CM | POA: Diagnosis not present

## 2022-05-31 DIAGNOSIS — B009 Herpesviral infection, unspecified: Secondary | ICD-10-CM | POA: Diagnosis not present

## 2022-05-31 DIAGNOSIS — Z13820 Encounter for screening for osteoporosis: Secondary | ICD-10-CM | POA: Diagnosis not present

## 2022-05-31 DIAGNOSIS — Z9081 Acquired absence of spleen: Secondary | ICD-10-CM | POA: Diagnosis not present

## 2022-05-31 DIAGNOSIS — E7800 Pure hypercholesterolemia, unspecified: Secondary | ICD-10-CM | POA: Diagnosis not present

## 2022-05-31 DIAGNOSIS — Z1211 Encounter for screening for malignant neoplasm of colon: Secondary | ICD-10-CM | POA: Diagnosis not present

## 2022-05-31 DIAGNOSIS — T63461A Toxic effect of venom of wasps, accidental (unintentional), initial encounter: Secondary | ICD-10-CM | POA: Diagnosis not present

## 2022-05-31 DIAGNOSIS — C8307 Small cell B-cell lymphoma, spleen: Secondary | ICD-10-CM | POA: Diagnosis not present

## 2022-05-31 DIAGNOSIS — D1721 Benign lipomatous neoplasm of skin and subcutaneous tissue of right arm: Secondary | ICD-10-CM | POA: Diagnosis not present

## 2022-05-31 DIAGNOSIS — K5909 Other constipation: Secondary | ICD-10-CM | POA: Diagnosis not present

## 2022-06-10 ENCOUNTER — Other Ambulatory Visit: Payer: Self-pay | Admitting: Family Medicine

## 2022-06-10 DIAGNOSIS — Z1231 Encounter for screening mammogram for malignant neoplasm of breast: Secondary | ICD-10-CM

## 2022-06-13 DIAGNOSIS — Z1211 Encounter for screening for malignant neoplasm of colon: Secondary | ICD-10-CM | POA: Diagnosis not present

## 2022-06-21 ENCOUNTER — Other Ambulatory Visit: Payer: Self-pay | Admitting: Surgery

## 2022-06-21 ENCOUNTER — Encounter: Payer: Self-pay | Admitting: Hematology

## 2022-06-21 DIAGNOSIS — R2231 Localized swelling, mass and lump, right upper limb: Secondary | ICD-10-CM | POA: Diagnosis not present

## 2022-06-23 DIAGNOSIS — Z23 Encounter for immunization: Secondary | ICD-10-CM | POA: Diagnosis not present

## 2022-07-11 DIAGNOSIS — Z961 Presence of intraocular lens: Secondary | ICD-10-CM | POA: Diagnosis not present

## 2022-07-11 DIAGNOSIS — H52203 Unspecified astigmatism, bilateral: Secondary | ICD-10-CM | POA: Diagnosis not present

## 2022-07-11 DIAGNOSIS — H524 Presbyopia: Secondary | ICD-10-CM | POA: Diagnosis not present

## 2022-07-11 DIAGNOSIS — H353132 Nonexudative age-related macular degeneration, bilateral, intermediate dry stage: Secondary | ICD-10-CM | POA: Diagnosis not present

## 2022-07-25 ENCOUNTER — Other Ambulatory Visit: Payer: Self-pay | Admitting: Surgery

## 2022-07-25 ENCOUNTER — Other Ambulatory Visit (HOSPITAL_COMMUNITY): Payer: Self-pay

## 2022-07-25 DIAGNOSIS — D1721 Benign lipomatous neoplasm of skin and subcutaneous tissue of right arm: Secondary | ICD-10-CM | POA: Diagnosis not present

## 2022-07-25 MED ORDER — TRAMADOL HCL 50 MG PO TABS
50.0000 mg | ORAL_TABLET | Freq: Four times a day (QID) | ORAL | 0 refills | Status: AC | PRN
  Filled 2022-07-25: qty 20, 5d supply, fill #0

## 2022-07-29 ENCOUNTER — Encounter: Payer: Self-pay | Admitting: Hematology

## 2022-08-05 ENCOUNTER — Emergency Department (HOSPITAL_BASED_OUTPATIENT_CLINIC_OR_DEPARTMENT_OTHER): Payer: Medicare Other

## 2022-08-05 ENCOUNTER — Other Ambulatory Visit: Payer: Self-pay

## 2022-08-05 ENCOUNTER — Inpatient Hospital Stay (HOSPITAL_BASED_OUTPATIENT_CLINIC_OR_DEPARTMENT_OTHER)
Admission: EM | Admit: 2022-08-05 | Discharge: 2022-08-26 | DRG: 871 | Disposition: E | Payer: Medicare Other | Attending: Internal Medicine | Admitting: Internal Medicine

## 2022-08-05 ENCOUNTER — Encounter (HOSPITAL_BASED_OUTPATIENT_CLINIC_OR_DEPARTMENT_OTHER): Payer: Self-pay | Admitting: Emergency Medicine

## 2022-08-05 ENCOUNTER — Emergency Department (HOSPITAL_BASED_OUTPATIENT_CLINIC_OR_DEPARTMENT_OTHER): Payer: Medicare Other | Admitting: Radiology

## 2022-08-05 DIAGNOSIS — E876 Hypokalemia: Secondary | ICD-10-CM | POA: Diagnosis not present

## 2022-08-05 DIAGNOSIS — Z1152 Encounter for screening for COVID-19: Secondary | ICD-10-CM | POA: Diagnosis not present

## 2022-08-05 DIAGNOSIS — N170 Acute kidney failure with tubular necrosis: Secondary | ICD-10-CM | POA: Diagnosis not present

## 2022-08-05 DIAGNOSIS — J811 Chronic pulmonary edema: Secondary | ICD-10-CM | POA: Diagnosis not present

## 2022-08-05 DIAGNOSIS — Z91048 Other nonmedicinal substance allergy status: Secondary | ICD-10-CM

## 2022-08-05 DIAGNOSIS — Z4682 Encounter for fitting and adjustment of non-vascular catheter: Secondary | ICD-10-CM | POA: Diagnosis not present

## 2022-08-05 DIAGNOSIS — I214 Non-ST elevation (NSTEMI) myocardial infarction: Secondary | ICD-10-CM | POA: Diagnosis not present

## 2022-08-05 DIAGNOSIS — D65 Disseminated intravascular coagulation [defibrination syndrome]: Secondary | ICD-10-CM | POA: Diagnosis present

## 2022-08-05 DIAGNOSIS — J9811 Atelectasis: Secondary | ICD-10-CM | POA: Diagnosis not present

## 2022-08-05 DIAGNOSIS — Z9081 Acquired absence of spleen: Secondary | ICD-10-CM

## 2022-08-05 DIAGNOSIS — C8307 Small cell B-cell lymphoma, spleen: Secondary | ICD-10-CM | POA: Diagnosis not present

## 2022-08-05 DIAGNOSIS — I213 ST elevation (STEMI) myocardial infarction of unspecified site: Secondary | ICD-10-CM | POA: Diagnosis not present

## 2022-08-05 DIAGNOSIS — R6521 Severe sepsis with septic shock: Secondary | ICD-10-CM | POA: Diagnosis present

## 2022-08-05 DIAGNOSIS — E872 Acidosis, unspecified: Secondary | ICD-10-CM | POA: Diagnosis not present

## 2022-08-05 DIAGNOSIS — I2121 ST elevation (STEMI) myocardial infarction involving left circumflex coronary artery: Secondary | ICD-10-CM | POA: Diagnosis not present

## 2022-08-05 DIAGNOSIS — J984 Other disorders of lung: Secondary | ICD-10-CM | POA: Diagnosis not present

## 2022-08-05 DIAGNOSIS — Z7982 Long term (current) use of aspirin: Secondary | ICD-10-CM | POA: Diagnosis not present

## 2022-08-05 DIAGNOSIS — A4150 Gram-negative sepsis, unspecified: Principal | ICD-10-CM | POA: Diagnosis present

## 2022-08-05 DIAGNOSIS — A419 Sepsis, unspecified organism: Secondary | ICD-10-CM | POA: Diagnosis present

## 2022-08-05 DIAGNOSIS — Z79899 Other long term (current) drug therapy: Secondary | ICD-10-CM | POA: Diagnosis not present

## 2022-08-05 DIAGNOSIS — N179 Acute kidney failure, unspecified: Principal | ICD-10-CM

## 2022-08-05 DIAGNOSIS — K5730 Diverticulosis of large intestine without perforation or abscess without bleeding: Secondary | ICD-10-CM | POA: Diagnosis not present

## 2022-08-05 DIAGNOSIS — R570 Cardiogenic shock: Secondary | ICD-10-CM | POA: Diagnosis not present

## 2022-08-05 DIAGNOSIS — Z888 Allergy status to other drugs, medicaments and biological substances status: Secondary | ICD-10-CM

## 2022-08-05 DIAGNOSIS — Z515 Encounter for palliative care: Secondary | ICD-10-CM | POA: Diagnosis not present

## 2022-08-05 DIAGNOSIS — Z4659 Encounter for fitting and adjustment of other gastrointestinal appliance and device: Secondary | ICD-10-CM | POA: Diagnosis not present

## 2022-08-05 DIAGNOSIS — R000 Tachycardia, unspecified: Secondary | ICD-10-CM | POA: Diagnosis not present

## 2022-08-05 DIAGNOSIS — R410 Disorientation, unspecified: Secondary | ICD-10-CM | POA: Diagnosis not present

## 2022-08-05 DIAGNOSIS — R6520 Severe sepsis without septic shock: Secondary | ICD-10-CM | POA: Diagnosis not present

## 2022-08-05 DIAGNOSIS — N12 Tubulo-interstitial nephritis, not specified as acute or chronic: Secondary | ICD-10-CM | POA: Diagnosis not present

## 2022-08-05 DIAGNOSIS — K439 Ventral hernia without obstruction or gangrene: Secondary | ICD-10-CM | POA: Diagnosis present

## 2022-08-05 DIAGNOSIS — K7200 Acute and subacute hepatic failure without coma: Secondary | ICD-10-CM | POA: Diagnosis not present

## 2022-08-05 DIAGNOSIS — R0603 Acute respiratory distress: Secondary | ICD-10-CM | POA: Diagnosis not present

## 2022-08-05 DIAGNOSIS — Z66 Do not resuscitate: Secondary | ICD-10-CM | POA: Diagnosis not present

## 2022-08-05 DIAGNOSIS — K529 Noninfective gastroenteritis and colitis, unspecified: Secondary | ICD-10-CM | POA: Diagnosis present

## 2022-08-05 DIAGNOSIS — K6389 Other specified diseases of intestine: Secondary | ICD-10-CM | POA: Diagnosis not present

## 2022-08-05 LAB — APTT: aPTT: 47 seconds — ABNORMAL HIGH (ref 24–36)

## 2022-08-05 LAB — I-STAT ARTERIAL BLOOD GAS, ED
Acid-base deficit: 13 mmol/L — ABNORMAL HIGH (ref 0.0–2.0)
Bicarbonate: 10.8 mmol/L — ABNORMAL LOW (ref 20.0–28.0)
Calcium, Ion: 1.1 mmol/L — ABNORMAL LOW (ref 1.15–1.40)
HCT: 37 % (ref 36.0–46.0)
Hemoglobin: 12.6 g/dL (ref 12.0–15.0)
O2 Saturation: 98 %
Patient temperature: 101.8
Potassium: 3.5 mmol/L (ref 3.5–5.1)
Sodium: 135 mmol/L (ref 135–145)
TCO2: 11 mmol/L — ABNORMAL LOW (ref 22–32)
pCO2 arterial: 21.4 mmHg — ABNORMAL LOW (ref 32–48)
pH, Arterial: 7.319 — ABNORMAL LOW (ref 7.35–7.45)
pO2, Arterial: 121 mmHg — ABNORMAL HIGH (ref 83–108)

## 2022-08-05 LAB — PROTIME-INR
INR: 1.6 — ABNORMAL HIGH (ref 0.8–1.2)
Prothrombin Time: 18.7 seconds — ABNORMAL HIGH (ref 11.4–15.2)

## 2022-08-05 LAB — CBC WITH DIFFERENTIAL/PLATELET
Abs Immature Granulocytes: 1.9 10*3/uL — ABNORMAL HIGH (ref 0.00–0.07)
Basophils Absolute: 0 10*3/uL (ref 0.0–0.1)
Basophils Relative: 0 %
Eosinophils Absolute: 0.2 10*3/uL (ref 0.0–0.5)
Eosinophils Relative: 1 %
HCT: 50.1 % — ABNORMAL HIGH (ref 36.0–46.0)
Hemoglobin: 16.3 g/dL — ABNORMAL HIGH (ref 12.0–15.0)
Lymphocytes Relative: 20 %
Lymphs Abs: 4.1 10*3/uL — ABNORMAL HIGH (ref 0.7–4.0)
MCH: 32 pg (ref 26.0–34.0)
MCHC: 32.5 g/dL (ref 30.0–36.0)
MCV: 98.4 fL (ref 80.0–100.0)
Metamyelocytes Relative: 5 %
Monocytes Absolute: 0.2 10*3/uL (ref 0.1–1.0)
Monocytes Relative: 1 %
Myelocytes: 4 %
Neutro Abs: 14.3 10*3/uL — ABNORMAL HIGH (ref 1.7–7.7)
Neutrophils Relative %: 69 %
Platelets: 105 10*3/uL — ABNORMAL LOW (ref 150–400)
RBC: 5.09 MIL/uL (ref 3.87–5.11)
RDW: 13.6 % (ref 11.5–15.5)
WBC Morphology: INCREASED
WBC: 20.7 10*3/uL — ABNORMAL HIGH (ref 4.0–10.5)
nRBC: 0 % (ref 0.0–0.2)

## 2022-08-05 LAB — RESP PANEL BY RT-PCR (FLU A&B, COVID) ARPGX2
Influenza A by PCR: NEGATIVE
Influenza B by PCR: NEGATIVE
SARS Coronavirus 2 by RT PCR: NEGATIVE

## 2022-08-05 LAB — COMPREHENSIVE METABOLIC PANEL
ALT: 493 U/L — ABNORMAL HIGH (ref 0–44)
AST: 690 U/L — ABNORMAL HIGH (ref 15–41)
Albumin: 3.9 g/dL (ref 3.5–5.0)
Alkaline Phosphatase: 103 U/L (ref 38–126)
Anion gap: 28 — ABNORMAL HIGH (ref 5–15)
BUN: 34 mg/dL — ABNORMAL HIGH (ref 8–23)
CO2: 11 mmol/L — ABNORMAL LOW (ref 22–32)
Calcium: 9.1 mg/dL (ref 8.9–10.3)
Chloride: 100 mmol/L (ref 98–111)
Creatinine, Ser: 2.74 mg/dL — ABNORMAL HIGH (ref 0.44–1.00)
GFR, Estimated: 18 mL/min — ABNORMAL LOW (ref >60)
Glucose, Bld: 79 mg/dL (ref 70–99)
Potassium: 3.4 mmol/L — ABNORMAL LOW (ref 3.5–5.1)
Sodium: 139 mmol/L (ref 135–145)
Total Bilirubin: 2.9 mg/dL — ABNORMAL HIGH (ref 0.3–1.2)
Total Protein: 6.3 g/dL — ABNORMAL LOW (ref 6.5–8.1)

## 2022-08-05 LAB — TROPONIN I (HIGH SENSITIVITY)
Troponin I (High Sensitivity): 157 ng/L (ref <18)
Troponin I (High Sensitivity): 184 ng/L (ref <18)

## 2022-08-05 LAB — LACTIC ACID, PLASMA
Lactic Acid, Venous: >9 mmol/L (ref 0.5–1.9)
Lactic Acid, Venous: 10.2 mmol/L (ref 0.5–1.9)

## 2022-08-05 LAB — LIPASE, BLOOD: Lipase: 27 U/L (ref 11–51)

## 2022-08-05 LAB — TSH: TSH: 2.092 u[IU]/mL (ref 0.350–4.500)

## 2022-08-05 LAB — AMMONIA: Ammonia: 31 umol/L (ref 9–35)

## 2022-08-05 LAB — BRAIN NATRIURETIC PEPTIDE: B Natriuretic Peptide: 312.6 pg/mL — ABNORMAL HIGH (ref 0.0–100.0)

## 2022-08-05 MED ORDER — SODIUM CHLORIDE 0.9 % IV BOLUS (SEPSIS): 1000.0000 mL | Freq: Once | INTRAVENOUS | Status: DC

## 2022-08-05 MED ORDER — LACTATED RINGERS IV BOLUS
1000.0000 mL | Freq: Once | INTRAVENOUS | Status: AC
  Administered 2022-08-05: 1000 mL via INTRAVENOUS

## 2022-08-05 MED ORDER — DEXTROSE 50 % IV SOLN: 12.5000 g | INTRAVENOUS | Status: AC

## 2022-08-05 MED ORDER — VANCOMYCIN HCL 500 MG IV SOLR
500.0000 mg | Freq: Once | INTRAVENOUS | Status: AC
  Administered 2022-08-05: 500 mg via INTRAVENOUS

## 2022-08-05 MED ORDER — METRONIDAZOLE 500 MG/100ML IV SOLN
500.0000 mg | Freq: Once | INTRAVENOUS | Status: AC
  Administered 2022-08-05: 500 mg via INTRAVENOUS
  Filled 2022-08-05: qty 100

## 2022-08-05 MED ORDER — CHLORHEXIDINE GLUCONATE CLOTH 2 % EX PADS
6.0000 | MEDICATED_PAD | Freq: Every day | CUTANEOUS | Status: DC
  Administered 2022-08-06: 6 via TOPICAL

## 2022-08-05 MED ORDER — SODIUM CHLORIDE 0.9 % IV BOLUS (SEPSIS)
1000.0000 mL | Freq: Once | INTRAVENOUS | Status: AC
  Administered 2022-08-05: 1000 mL via INTRAVENOUS

## 2022-08-05 MED ORDER — LACTATED RINGERS IV SOLN: INTRAVENOUS | Status: AC

## 2022-08-05 MED ORDER — ENOXAPARIN SODIUM 30 MG/0.3ML IJ SOSY
30.0000 mg | PREFILLED_SYRINGE | INTRAMUSCULAR | Status: DC
  Administered 2022-08-05: 30 mg via SUBCUTANEOUS
  Filled 2022-08-05: qty 0.3

## 2022-08-05 MED ORDER — SODIUM CHLORIDE 0.9 % IV SOLN: 2.0000 g | INTRAVENOUS | Status: DC

## 2022-08-05 MED ORDER — NOREPINEPHRINE 4 MG/250ML-% IV SOLN
0.0000 ug/min | INTRAVENOUS | Status: DC
  Administered 2022-08-05: 20 ug/min via INTRAVENOUS
  Administered 2022-08-05: 4 ug/min via INTRAVENOUS
  Filled 2022-08-05 (×2): qty 250

## 2022-08-05 MED ORDER — SODIUM CHLORIDE 0.9 % IV SOLN
2.0000 g | Freq: Once | INTRAVENOUS | Status: AC
  Administered 2022-08-05: 2 g via INTRAVENOUS
  Filled 2022-08-05: qty 12.5

## 2022-08-05 MED ORDER — DEXTROSE 50 % IV SOLN
INTRAVENOUS | Status: AC
  Administered 2022-08-06: 12.5 g via INTRAVENOUS
  Filled 2022-08-05: qty 50

## 2022-08-05 MED ORDER — VANCOMYCIN VARIABLE DOSE PER UNSTABLE RENAL FUNCTION (PHARMACIST DOSING)
Status: DC
  Filled 2022-08-05: qty 1

## 2022-08-05 MED ORDER — SODIUM CHLORIDE 0.9 % IV SOLN: 1000.0000 mL | INTRAVENOUS | Status: DC

## 2022-08-05 MED ORDER — POTASSIUM CHLORIDE 10 MEQ/100ML IV SOLN
10.0000 meq | INTRAVENOUS | Status: AC
  Administered 2022-08-06 (×2): 10 meq via INTRAVENOUS
  Filled 2022-08-05 (×2): qty 100

## 2022-08-05 MED ORDER — LACTATED RINGERS IV BOLUS (SEPSIS)
1000.0000 mL | Freq: Once | INTRAVENOUS | Status: AC
  Administered 2022-08-05: 1000 mL via INTRAVENOUS

## 2022-08-05 MED ORDER — HYDROCORTISONE SOD SUC (PF) 100 MG IJ SOLR
100.0000 mg | Freq: Two times a day (BID) | INTRAMUSCULAR | Status: DC
  Administered 2022-08-05 – 2022-08-06 (×3): 100 mg via INTRAVENOUS
  Filled 2022-08-05 (×4): qty 2

## 2022-08-05 MED ORDER — PIPERACILLIN-TAZOBACTAM 3.375 G IVPB 30 MIN
3.3750 g | Freq: Once | INTRAVENOUS | Status: AC
  Administered 2022-08-05: 3.375 g via INTRAVENOUS
  Filled 2022-08-05: qty 50

## 2022-08-05 MED ORDER — SODIUM BICARBONATE 8.4 % IV SOLN
50.0000 meq | Freq: Once | INTRAVENOUS | Status: AC
  Administered 2022-08-05: 50 meq via INTRAVENOUS
  Filled 2022-08-05: qty 50

## 2022-08-05 MED ORDER — PIPERACILLIN-TAZOBACTAM IN DEX 2-0.25 GM/50ML IV SOLN
2.2500 g | Freq: Four times a day (QID) | INTRAVENOUS | Status: DC
  Administered 2022-08-06 (×2): 2.25 g via INTRAVENOUS
  Filled 2022-08-05 (×4): qty 50

## 2022-08-05 MED ORDER — VANCOMYCIN HCL IN DEXTROSE 1-5 GM/200ML-% IV SOLN: 1000.0000 mg | Freq: Once | INTRAVENOUS | Status: DC

## 2022-08-05 MED ORDER — VANCOMYCIN HCL IN DEXTROSE 1-5 GM/200ML-% IV SOLN
1000.0000 mg | Freq: Once | INTRAVENOUS | Status: AC
  Administered 2022-08-05: 1000 mg via INTRAVENOUS
  Filled 2022-08-05: qty 200

## 2022-08-05 NOTE — ED Notes (Signed)
RT placed pt on BIPAP for increased WOB and criticial PCO2 of 15.3 (MD aware). Pt respiratory status stable w/some distress noted, increased WOB, Tachypneic. Pt placed on Servi U  Bipap/PCV 10/5 BUR 16 40%. Pt resting comfortably at this time, post placement ABG ordered for 2030. RT will continue to monitor while at [facility] ED.

## 2022-08-05 NOTE — ED Notes (Signed)
ED Provider at bedside. 

## 2022-08-05 NOTE — ED Triage Notes (Incomplete)
Weakness, chill, vomiting. Body aches, sob started

## 2022-08-05 NOTE — ED Notes (Signed)
Report given to [facility] and Agricultural consultant at [facility] ED.

## 2022-08-05 NOTE — ED Provider Notes (Signed)
Received patient from transfer from [facility].  This is a 71 year old female with history of non-Hodgkin lymphoma who initially presents with generalized weakness nausea and vomiting since yesterday.  Her work-up was remarkable for septic shock likely in the setting of kidney infection.  Patient was started on broad-spectrum antibiotic, vasopressor as she currently experiencing multiorgan system failure.  When arrived patient is on a BiPAP, but mentating able to answer question.  She is hypotensive with blood pressure of 88/61 and tachycardic with heart rate of 118.  She is receiving fluid bolus at this time.  We will consult intensivist for admission.  I have notified critical care team who will evaluate and admit pt for further care.    BP (!) 88/61 (BP Location: Right Arm)   Pulse (!) 118   Temp (!) 100.8 F (38.2 C) (Axillary)   Resp (!) 27   SpO2 98%   Results for orders placed or performed during the hospital encounter of 08/06/2022  Resp Panel by RT-PCR (Flu A&B, Covid) Anterior Nasal Swab   Specimen: Anterior Nasal Swab  Result Value Ref Range   SARS Coronavirus 2 by RT PCR NEGATIVE NEGATIVE   Influenza A by PCR NEGATIVE NEGATIVE   Influenza B by PCR NEGATIVE NEGATIVE  Comprehensive metabolic panel  Result Value Ref Range   Sodium 139 135 - 145 mmol/L   Potassium 3.4 (L) 3.5 - 5.1 mmol/L   Chloride 100 98 - 111 mmol/L   CO2 11 (L) 22 - 32 mmol/L   Glucose, Bld 79 70 - 99 mg/dL   BUN 34 (H) 8 - 23 mg/dL   Creatinine, Ser 2.74 (H) 0.44 - 1.00 mg/dL   Calcium 9.1 8.9 - 10.3 mg/dL   Total Protein 6.3 (L) 6.5 - 8.1 g/dL   Albumin 3.9 3.5 - 5.0 g/dL   AST 690 (H) 15 - 41 U/L   ALT 493 (H) 0 - 44 U/L   Alkaline Phosphatase 103 38 - 126 U/L   Total Bilirubin 2.9 (H) 0.3 - 1.2 mg/dL   GFR, Estimated 18 (L) >60 mL/min   Anion gap 28 (H) 5 - 15  Lactic acid, plasma  Result Value Ref Range   Lactic Acid, Venous >9.0 (HH) 0.5 - 1.9 mmol/L  Lactic acid, plasma  Result  Value Ref Range   Lactic Acid, Venous 10.2 (HH) 0.5 - 1.9 mmol/L  CBC with Differential  Result Value Ref Range   WBC 20.7 (H) 4.0 - 10.5 K/uL   RBC 5.09 3.87 - 5.11 MIL/uL   Hemoglobin 16.3 (H) 12.0 - 15.0 g/dL   HCT 50.1 (H) 36.0 - 46.0 %   MCV 98.4 80.0 - 100.0 fL   MCH 32.0 26.0 - 34.0 pg   MCHC 32.5 30.0 - 36.0 g/dL   RDW 13.6 11.5 - 15.5 %   Platelets 105 (L) 150 - 400 K/uL   nRBC 0.0 0.0 - 0.2 %   Neutrophils Relative % 69 %   Neutro Abs 14.3 (H) 1.7 - 7.7 K/uL   Lymphocytes Relative 20 %   Lymphs Abs 4.1 (H) 0.7 - 4.0 K/uL   Monocytes Relative 1 %   Monocytes Absolute 0.2 0.1 - 1.0 K/uL   Eosinophils Relative 1 %   Eosinophils Absolute 0.2 0.0 - 0.5 K/uL   Basophils Relative 0 %   Basophils Absolute 0.0 0.0 - 0.1 K/uL   WBC Morphology INCREASED BANDS (>20% BANDS)    RBC Morphology Acanthocytes present    Other BACTERIA PRESENT,  CONFIRMED BY REDRAW. %   Metamyelocytes Relative 5 %   Myelocytes 4 %   Abs Immature Granulocytes 1.90 (H) 0.00 - 0.07 K/uL   Tear Drop Cells PRESENT    Tammy Sours Bodies PRESENT    Giant PLTs PRESENT   Protime-INR  Result Value Ref Range   Prothrombin Time 18.7 (H) 11.4 - 15.2 seconds   INR 1.6 (H) 0.8 - 1.2  APTT  Result Value Ref Range   aPTT 47 (H) 24 - 36 seconds  Lipase, blood  Result Value Ref Range   Lipase 27 11 - 51 U/L  Brain natriuretic peptide  Result Value Ref Range   B Natriuretic Peptide 312.6 (H) 0.0 - 100.0 pg/mL  Ammonia  Result Value Ref Range   Ammonia 31 9 - 35 umol/L  TSH  Result Value Ref Range   TSH 2.092 0.350 - 4.500 uIU/mL  I-Stat arterial blood gas, ED  Result Value Ref Range   pH, Arterial 7.319 (L) 7.35 - 7.45   pCO2 arterial 21.4 (L) 32 - 48 mmHg   pO2, Arterial 121 (H) 83 - 108 mmHg   Bicarbonate 10.8 (L) 20.0 - 28.0 mmol/L   TCO2 11 (L) 22 - 32 mmol/L   O2 Saturation 98 %   Acid-base deficit 13.0 (H) 0.0 - 2.0 mmol/L   Sodium 135 135 - 145 mmol/L   Potassium 3.5 3.5 - 5.1 mmol/L    Calcium, Ion 1.10 (L) 1.15 - 1.40 mmol/L   HCT 37.0 36.0 - 46.0 %   Hemoglobin 12.6 12.0 - 15.0 g/dL   Patient temperature 101.8 F    Collection site RADIAL, ALLEN'S TEST ACCEPTABLE    Drawn by RT    Sample type ARTERIAL    Comment NOTIFIED PHYSICIAN   Troponin I (High Sensitivity)  Result Value Ref Range   Troponin I (High Sensitivity) 157 (HH) <18 ng/L  Troponin I (High Sensitivity)  Result Value Ref Range   Troponin I (High Sensitivity) 184 (HH) <18 ng/L   CT CHEST ABDOMEN PELVIS WO CONTRAST  Result Date: 08/18/2022 CLINICAL DATA:  Sepsis EXAM: CT CHEST, ABDOMEN AND PELVIS WITHOUT CONTRAST TECHNIQUE: Multidetector CT imaging of the chest, abdomen and pelvis was performed following the standard protocol without IV contrast. RADIATION DOSE REDUCTION: This exam was performed according to the departmental dose-optimization program which includes automated exposure control, adjustment of the mA and/or kV according to patient size and/or use of iterative reconstruction technique. COMPARISON:  CT 12/31/2019 FINDINGS: CT CHEST FINDINGS Cardiovascular: Limited evaluation without intravenous contrast. Mild atherosclerosis. No aneurysm. Normal cardiac size. Trace pericardial effusion Mediastinum/Nodes: Midline trachea. No thyroid mass. No suspicious lymph nodes. Esophagus within normal limits. Lungs/Pleura: No consolidation or pleural effusion. Mild dependent atelectasis. Musculoskeletal: Sternum is intact.  No acute osseous abnormality. CT ABDOMEN PELVIS FINDINGS Hepatobiliary: No focal liver abnormality is seen. No gallstones, gallbladder wall thickening, or biliary dilatation. Pancreas: Unremarkable. No pancreatic ductal dilatation or surrounding inflammatory changes. Spleen: Splenectomy Adrenals/Urinary Tract: Adrenal glands are within normal limits. Hazy bilateral perinephric fat stranding and fat stranding at the renal pelvises left greater than right. No hydronephrosis. No obstructing stone. The  bladder is unremarkable Stomach/Bowel: The stomach is nonenlarged. No dilated small bowel. Collapsed appearance of the colon, no inflammation. Mild diverticular disease of the left colon. Appendix contains small stone but is otherwise normal in appearance. Vascular/Lymphatic: Mild atherosclerosis. No aneurysm. No suspicious lymph nodes Reproductive: Uterus and bilateral adnexa are unremarkable. Other: Negative for pelvic effusion or free air. Periumbilical  ventral hernia now containing fat and loop of small bowel but no obstruction or incarceration. Musculoskeletal: No acute osseous abnormality. IMPRESSION: 1. No CT evidence for acute intrathoracic abnormality. 2. Hazy bilateral perinephric fat stranding and fat stranding at the renal pelvises left greater than right, findings are suggestive upper urinary tract infection/pyelonephritis. No hydronephrosis or obstructing stone. Correlate with urinalysis 3. Periumbilical ventral hernia now containing fat and loop of small bowel but no obstruction or incarceration. 4. Mild diverticular disease of the left colon without acute inflammatory process. Electronically Signed   By: Donavan Foil M.D.   On: 08/19/2022 20:00   CT Head Wo Contrast  Result Date: 08/23/2022 CLINICAL DATA:  Delirium. EXAM: CT HEAD WITHOUT CONTRAST TECHNIQUE: Contiguous axial images were obtained from the base of the skull through the vertex without intravenous contrast. RADIATION DOSE REDUCTION: This exam was performed according to the departmental dose-optimization program which includes automated exposure control, adjustment of the mA and/or kV according to patient size and/or use of iterative reconstruction technique. COMPARISON:  None Available. FINDINGS: Brain: The ventricles and sulci are appropriate size for the patient's age. The gray-white matter discrimination is preserved. There is no acute intracranial hemorrhage. No mass effect or midline shift. No extra-axial fluid collection.  Vascular: No hyperdense vessel or unexpected calcification. Skull: Normal. Negative for fracture or focal lesion. Sinuses/Orbits: No acute finding. Other: None IMPRESSION: No acute intracranial pathology. Electronically Signed   By: Anner Crete M.D.   On: 08/24/2022 19:53   DG Chest Port 1 View  Result Date: 08/15/2022 CLINICAL DATA:  Possible sepsis EXAM: PORTABLE CHEST 1 VIEW COMPARISON:  10/09/2018 FINDINGS: The heart size and mediastinal contours are within normal limits. Both lungs are clear. The visualized skeletal structures are unremarkable. Minimal atelectasis at the lingula and left base. IMPRESSION: No active disease. Minimal atelectasis at the lingula and left base. Electronically Signed   By: Donavan Foil M.D.   On: 07/30/2022 17:29      Domenic Moras, PA-C 08/25/2022 2112    Sherwood Gambler, MD 08-14-2022 [phone]

## 2022-08-05 NOTE — ED Provider Notes (Signed)
[facility] EMERGENCY DEPT Provider Note   CSN: [id number] Arrival date & time: 08/24/2022  1350     History  Chief Complaint  Patient presents with   Illness    Michelle Cohen is a 71 y.o. female.  Patient as above with significant medical history as below, including under care of Heme-onc Dr Carolyne Fiscal, hx splenic marginal zone lymphoma (non-hodgkins). Was previously on Rutixan and Rituximab, s/p splenectomy at [facility] who presents to the ED with complaint of n/v/malaise/ weakness.  Patient normal state of health when she went to bed last night, woke up around 2 AM this morning with profuse nausea, vomiting, diarrhea.  Vomiting subsided around 4 AM.  Continues to have ongoing diarrhea.  Profuse, semiformed stool.  No melena or BRBPR.  Abdominal cramping.  Patient is very weak this morning, difficulty getting out of her chair.  No LOC.  Her symptoms have improved and she is able to ambulate with great difficulty.,  She feels overall very weak, difficulty moving extremities globally, no unilateral weakness.  Denies any unilateral numbness or tingling, no vision changes.  No recent falls or head injuries.  No recent travel or sick contacts.  She did have a right arm lipoma removed around a week ago as an outpatient.  Has no recent hospitalizations, no recent antibiotics.  No recent sick contacts or travel.  No recent suspicious p.o. intake.   Also having nonproductive cough, difficulty breathing.  Rigors, subjective fevers at home.  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 No language interpreter was used.  Illness Associated symptoms: abdominal pain, cough, diarrhea, fatigue, fever, nausea, shortness of  breath and vomiting   Associated symptoms: no chest pain, no headaches and no rash        Home Medications Prior to Admission medications   Medication Sig Start Date End Date Taking? Authorizing Provider  acetaminophen (TYLENOL) 500 MG tablet Take 500 mg by mouth every 8 (eight) hours as needed for moderate pain.     [provider]  acyclovir (ZOVIRAX) 200 MG capsule TAKE 2 CAPSULES BY MOUTH TWICE DAILY 12/03/21 12/03/22  Brunetta Genera, MD  albuterol (PROVENTIL HFA;VENTOLIN HFA) 108 (90 Base) MCG/ACT inhaler Inhale 1-2 puffs into the lungs every 6 (six) hours as needed for wheezing or shortness of breath. 10/09/18   Melynda Ripple, MD  aspirin 81 MG EC tablet Take by mouth.    [provider]  augmented betamethasone dipropionate (DIPROLENE-AF) 0.05 % cream SMARTSIG:Sparingly Topical Twice Daily PRN 06/22/20   [provider]  benzonatate (TESSALON) 200 MG capsule Take 1 capsule (200 mg total) by mouth 3 (three) times daily as needed for cough. 10/09/18   Melynda Ripple, MD  chlorhexidine (PERIDEX) 0.12 % solution SWISH WITH 15 MLS IN MOUTH OR THROAT THEN SPIT --USE TWICE DAILY AS DIRECTED 04/29/22 04/29/23  Brunetta Genera, MD  chlorpheniramine-HYDROcodone [facility]) 10-8 MG/5ML SUER Take 5 mLs by mouth every 12 (twelve) hours as needed for cough. 10/09/18   Melynda Ripple, MD  Cyanocobalamin (VITAMIN B 12 PO) Take by mouth.    [provider]  Diphenhyd-Hydrocort-Nystatin (FIRST-DUKES MOUTHWASH MT) Take by mouth. 10/04/17  [provider]  docusate sodium (COLACE) 100 MG capsule Take 100 mg by mouth 2 (two) times daily.    [provider]  erythromycin ophthalmic ointment SMARTSIG:In Eye(s) 04/27/20   [provider]  fluocinonide (LIDEX) 0.05 % external solution Apply 1 ml to the affected skin nightly as needed for inflammation 06/22/20   Harriett Sine, MD  fluticasone [facility]) 50 MCG/ACT nasal spray  Place 2 sprays into both nostrils daily. 10/09/18   Melynda Ripple, MD  folic acid (FOLVITE) 1 MG tablet Take by mouth. 01/15/18   [provider]  hydrOXYzine (ATARAX/VISTARIL) 25 MG tablet Take 1-2 tablets (25-50 mg total) by mouth every 4 (four) hours as needed. 02/05/20   Brunetta Genera, MD  ketoconazole (NIZORAL) 2 % shampoo Apply 1 application topically 2 (two) times a week. 05/07/20   Brunetta Genera, MD  Loratadine 10 MG CAPS Take 10 mg by mouth daily.    [provider]  magic mouthwash w/lidocaine SOLN Take 5 mLs by mouth 4 (four) times daily as needed for mouth pain. Swish and spit 01/20/20   Brunetta Genera, MD  moxifloxacin [facility]) 0.5 % ophthalmic solution  09/05/19   [provider]  oxyCODONE (OXY IR/ROXICODONE) 5 MG immediate release tablet Take by mouth.    [provider]  Prednisolon-Gatiflox-Bromfenac 1-0.5-0.075 % SUSP Place 1 drop into the right eye 4 (four) times daily. 07/31/19   [provider]  prednisoLONE acetate (PRED FORTE) 1 % ophthalmic suspension  09/05/19   [provider]  predniSONE (DELTASONE) 20 MG tablet Take 2 tablets (40 mg total) by mouth daily with breakfast. Starting 5 days prior to 1st dose of Rituxan 12/16/19   Brunetta Genera, MD  senna (SENOKOT) 8.6 MG TABS tablet Take 1 tablet by mouth daily as needed for mild constipation.     [provider]  Spacer/Aero-Holding Chambers (AEROCHAMBER PLUS) inhaler Use as instructed 10/09/18   Melynda Ripple, MD  traMADol (ULTRAM) 50 MG tablet Take 1 tablet (50 mg total) by mouth every 6 (six) hours as needed. 07/25/22     triamcinolone cream (KENALOG) 0.1 % Apply 1 application topically 2 (two) times daily. 03/09/19   Wieters, Hallie C, PA-C      Allergies    Allopurinol and Tape    Review of Systems   Review of Systems  Constitutional:  Positive for chills, diaphoresis, fatigue and fever. Negative for activity change.  HENT:   Negative for facial swelling and trouble swallowing.   Eyes:  Negative for discharge and redness.  Respiratory:  Positive for cough, chest tightness and shortness of breath.   Cardiovascular:  Negative for chest pain and palpitations.  Gastrointestinal:  Positive for abdominal pain, diarrhea, nausea and vomiting.  Genitourinary:  Negative for dysuria and flank pain.  Musculoskeletal:  Negative for back pain and gait problem.  Skin:  Negative for pallor and rash.  Neurological:  Positive for weakness. Negative for syncope and headaches.    Physical Exam Updated Vital Signs BP (!) 88/61   Pulse (!) 118   Temp 98.2 F (36.8 C) (Oral)   Resp (!) 27   SpO2 98%  Physical Exam Vitals and nursing note reviewed. Exam conducted with a chaperone present.  Constitutional:      General: She is in acute distress.     Appearance: Normal appearance. She is ill-appearing.  HENT:     Head: Normocephalic and atraumatic. No right periorbital erythema or left periorbital erythema.  Right Ear: External ear normal.     Left Ear: External ear normal.     Nose: Nose normal.     Mouth/Throat:     Mouth: Mucous membranes are dry.  Eyes:     General: No scleral icterus.       Right eye: No discharge.        Left eye: No discharge.  Cardiovascular:     Rate and Rhythm: Regular rhythm. Tachycardia present.     Pulses: Normal pulses.     Heart sounds: Normal heart sounds.     No S3 or S4 sounds.  Pulmonary:     Effort: Pulmonary effort is normal. Tachypnea present. No accessory muscle usage or respiratory distress.     Breath sounds: Normal breath sounds. Decreased air movement present.  Abdominal:     General: Abdomen is flat.     Palpations: Abdomen is soft.     Tenderness: There is generalized abdominal tenderness.       Comments: Not peritoneal   Musculoskeletal:        General: Normal range of motion.     Cervical back: Full passive range of motion without pain and normal range of  motion.     Right lower leg: No edema.     Left lower leg: No edema.  Skin:    General: Skin is warm and dry.     Capillary Refill: Capillary refill takes less than 2 seconds.       Neurological:     Mental Status: She is alert and oriented to person, place, and time.     GCS: GCS eye subscore is 4. GCS verbal subscore is 5. GCS motor subscore is 6.  Psychiatric:        Mood and Affect: Mood normal.        Behavior: Behavior normal.     ED Results / Procedures / Treatments   Labs (all labs ordered are listed, but only abnormal results are displayed) Labs Reviewed  COMPREHENSIVE METABOLIC PANEL - Abnormal; Notable for the following components:      Result Value   Potassium 3.4 (*)    CO2 11 (*)    BUN 34 (*)    Creatinine, Ser 2.74 (*)    Total Protein 6.3 (*)    AST 690 (*)    ALT 493 (*)    Total Bilirubin 2.9 (*)    GFR, Estimated 18 (*)    Anion gap 28 (*)    All other components within normal limits  LACTIC ACID, PLASMA - Abnormal; Notable for the following components:   Lactic Acid, Venous >9.0 (*)    All other components within normal limits  LACTIC ACID, PLASMA - Abnormal; Notable for the following components:   Lactic Acid, Venous 10.2 (*)    All other components within normal limits  CBC WITH DIFFERENTIAL/PLATELET - Abnormal; Notable for the following components:   WBC 20.7 (*)    Hemoglobin 16.3 (*)    HCT 50.1 (*)    Platelets 105 (*)    Neutro Abs 14.3 (*)    Lymphs Abs 4.1 (*)    Abs Immature Granulocytes 1.90 (*)    All other components within normal limits  PROTIME-INR - Abnormal; Notable for the following components:   Prothrombin Time 18.7 (*)    INR 1.6 (*)    All other components within normal limits  APTT - Abnormal; Notable for the following components:   aPTT 47 (*)  All other components within normal limits  BRAIN NATRIURETIC PEPTIDE - Abnormal; Notable for the following components:   B Natriuretic Peptide 312.6 (*)    All other  components within normal limits  I-STAT ARTERIAL BLOOD GAS, ED - Abnormal; Notable for the following components:   pH, Arterial 7.319 (*)    pCO2 arterial 21.4 (*)    pO2, Arterial 121 (*)    Bicarbonate 10.8 (*)    TCO2 11 (*)    Acid-base deficit 13.0 (*)    Calcium, Ion 1.10 (*)    All other components within normal limits  TROPONIN I (HIGH SENSITIVITY) - Abnormal; Notable for the following components:   Troponin I (High Sensitivity) 157 (*)    All other components within normal limits  TROPONIN I (HIGH SENSITIVITY) - Abnormal; Notable for the following components:   Troponin I (High Sensitivity) 184 (*)    All other components within normal limits  RESP PANEL BY RT-PCR (FLU A&B, COVID) ARPGX2  CULTURE, BLOOD (ROUTINE X 2)  CULTURE, BLOOD (ROUTINE X 2)  URINE CULTURE  LIPASE, BLOOD  AMMONIA  TSH  URINALYSIS, ROUTINE W REFLEX MICROSCOPIC  BLOOD GAS, VENOUS  PATHOLOGIST SMEAR REVIEW    EKG EKG Interpretation  Date/Time:  Friday August 05 2022 16:40:49 EST Ventricular Rate:  115 PR Interval:  154 QRS Duration: 85 QT Interval:  328 QTC Calculation: 454 R Axis:   -57 Text Interpretation: Sinus tachycardia Left anterior fascicular block Abnormal R-wave progression, late transition no prior Confirmed by Wynona Dove ([id number]) on 07/31/2022 4:45:00 PM  Radiology CT CHEST ABDOMEN PELVIS WO CONTRAST  Result Date: 08/18/2022 CLINICAL DATA:  Sepsis EXAM: CT CHEST, ABDOMEN AND PELVIS WITHOUT CONTRAST TECHNIQUE: Multidetector CT imaging of the chest, abdomen and pelvis was performed following the standard protocol without IV contrast. RADIATION DOSE REDUCTION: This exam was performed according to the departmental dose-optimization program which includes automated exposure control, adjustment of the mA and/or kV according to patient size and/or use of iterative reconstruction technique. COMPARISON:  CT 12/31/2019 FINDINGS: CT CHEST FINDINGS Cardiovascular: Limited evaluation without  intravenous contrast. Mild atherosclerosis. No aneurysm. Normal cardiac size. Trace pericardial effusion Mediastinum/Nodes: Midline trachea. No thyroid mass. No suspicious lymph nodes. Esophagus within normal limits. Lungs/Pleura: No consolidation or pleural effusion. Mild dependent atelectasis. Musculoskeletal: Sternum is intact.  No acute osseous abnormality. CT ABDOMEN PELVIS FINDINGS Hepatobiliary: No focal liver abnormality is seen. No gallstones, gallbladder wall thickening, or biliary dilatation. Pancreas: Unremarkable. No pancreatic ductal dilatation or surrounding inflammatory changes. Spleen: Splenectomy Adrenals/Urinary Tract: Adrenal glands are within normal limits. Hazy bilateral perinephric fat stranding and fat stranding at the renal pelvises left greater than right. No hydronephrosis. No obstructing stone. The bladder is unremarkable Stomach/Bowel: The stomach is nonenlarged. No dilated small bowel. Collapsed appearance of the colon, no inflammation. Mild diverticular disease of the left colon. Appendix contains small stone but is otherwise normal in appearance. Vascular/Lymphatic: Mild atherosclerosis. No aneurysm. No suspicious lymph nodes Reproductive: Uterus and bilateral adnexa are unremarkable. Other: Negative for pelvic effusion or free air. Periumbilical ventral hernia now containing fat and loop of small bowel but no obstruction or incarceration. Musculoskeletal: No acute osseous abnormality. IMPRESSION: 1. No CT evidence for acute intrathoracic abnormality. 2. Hazy bilateral perinephric fat stranding and fat stranding at the renal pelvises left greater than right, findings are suggestive upper urinary tract infection/pyelonephritis. No hydronephrosis or obstructing stone. Correlate with urinalysis 3. Periumbilical ventral hernia now containing fat and loop of small bowel but no obstruction or  incarceration. 4. Mild diverticular disease of the left colon without acute inflammatory process.  Electronically Signed   By: Donavan Foil M.D.   On: 08/13/2022 20:00   CT Head Wo Contrast  Result Date: 08/17/2022 CLINICAL DATA:  Delirium. EXAM: CT HEAD WITHOUT CONTRAST TECHNIQUE: Contiguous axial images were obtained from the base of the skull through the vertex without intravenous contrast. RADIATION DOSE REDUCTION: This exam was performed according to the departmental dose-optimization program which includes automated exposure control, adjustment of the mA and/or kV according to patient size and/or use of iterative reconstruction technique. COMPARISON:  None Available. FINDINGS: Brain: The ventricles and sulci are appropriate size for the patient's age. The Cathy Ropp-white matter discrimination is preserved. There is no acute intracranial hemorrhage. No mass effect or midline shift. No extra-axial fluid collection. Vascular: No hyperdense vessel or unexpected calcification. Skull: Normal. Negative for fracture or focal lesion. Sinuses/Orbits: No acute finding. Other: None IMPRESSION: No acute intracranial pathology. Electronically Signed   By: Anner Crete M.D.   On: 08/04/2022 19:53   DG Chest Port 1 View  Result Date: 08/22/2022 CLINICAL DATA:  Possible sepsis EXAM: PORTABLE CHEST 1 VIEW COMPARISON:  10/09/2018 FINDINGS: The heart size and mediastinal contours are within normal limits. Both lungs are clear. The visualized skeletal structures are unremarkable. Minimal atelectasis at the lingula and left base. IMPRESSION: No active disease. Minimal atelectasis at the lingula and left base. Electronically Signed   By: Donavan Foil M.D.   On: 08/16/2022 17:29    Procedures .Critical Care  Performed by: Jeanell Sparrow, DO Authorized by: Jeanell Sparrow, DO   Critical care provider statement:    Critical care time (minutes):  80   Critical care time was exclusive of:  Separately billable procedures and treating other patients   Critical care was necessary to treat or prevent imminent or  life-threatening deterioration of the following conditions:  Shock, respiratory failure, renal failure and sepsis   Critical care was time spent personally by me on the following activities:  Development of treatment plan with patient or surrogate, discussions with consultants, evaluation of patient's response to treatment, examination of patient, ordering and review of laboratory studies, ordering and review of radiographic studies, ordering and performing treatments and interventions, pulse oximetry, re-evaluation of patient's condition, review of old charts and obtaining history from patient or surrogate Ultrasound ED Peripheral IV (Provider)  Date/Time: 08/20/2022 5:34 PM  Performed by: Jeanell Sparrow, DO Authorized by: Jeanell Sparrow, DO   Procedure details:    Indications: hypotension, multiple failed IV attempts and poor IV access     Skin Prep: chlorhexidine gluconate     Location:  Right AC   Angiocath:  18 G   Bedside Ultrasound Guided: Yes     Images: not archived     Patient tolerated procedure without complications: Yes     Dressing applied: Yes       Medications Ordered in ED Medications  lactated ringers infusion ( Intravenous New Bag/Given 08/11/2022 1842)  norepinephrine (LEVOPHED) '4mg'$  in 230m (0.016 mg/mL) premix infusion (13 mcg/min Intravenous Rate/Dose Change 08/09/2022 1932)  vancomycin variable dose per unstable renal function (pharmacist dosing) (has no administration in time range)  ceFEPIme (MAXIPIME) 2 g in sodium chloride 0.9 % 100 mL IVPB (has no administration in time range)  lactated ringers bolus 1,000 mL (0 mLs Intravenous Stopped 08/04/2022 1843)  ceFEPIme (MAXIPIME) 2 g in sodium chloride 0.9 % 100 mL IVPB (0 g Intravenous Stopped 07/28/2022 1825)  metroNIDAZOLE (FLAGYL) IVPB 500 mg (0 mg Intravenous Stopped 08/20/2022 1821)  vancomycin (VANCOCIN) IVPB 1000 mg/200 mL premix (0 mg Intravenous Stopped 08/20/2022 2002)    Followed by  vancomycin (VANCOCIN) 500 mg  in sodium chloride 0.9 % 100 mL IVPB (0 mg Intravenous Stopped 08/07/2022 2002)  sodium chloride 0.9 % bolus 1,000 mL (0 mLs Intravenous Stopped 08/04/2022 1821)  lactated ringers bolus 1,000 mL (0 mLs Intravenous Stopped 08/03/2022 1822)  lactated ringers bolus 1,000 mL (1,000 mLs Intravenous New Bag/Given 08/04/2022 1822)  sodium bicarbonate injection 50 mEq (50 mEq Intravenous Given 08/14/2022 2013)    ED Course/ Medical Decision Making/ A&P Clinical Course as of 08/09/2022 2034  Fri Aug 05, 2022  1926 Istat VBG initial pH 7.13, CO2 <15, HCO3 not detected; rpt VBG pH 7.209, CO2 15.3, HCO3 6.1 [SG]  1938 Plan send ED to ED, Dr Regenia Skeeter accepting to [facility]  [SG]  2006 CT concernign for ?pyelonephritis  [SG]    Clinical Course User Index [SG] Jeanell Sparrow, DO                           Medical Decision Making Amount and/or Complexity of Data Reviewed Labs: ordered. Radiology: ordered. ECG/medicine tests: ordered.  Risk Prescription drug management. Decision regarding hospitalization.   This patient presents to the ED with chief complaint(s) of n/v/diarrhea/ weakness/malaise/ fevers/chills/diaphoretic with pertinent past medical history of splenectomy, lymphoma which further complicates the presenting complaint. The complaint involves an extensive differential diagnosis and also carries with it a high risk of complications and morbidity.    The differential diagnosis includes but not limited to Differential diagnosis for adult fever includes but is not exclusive to community-acquired pneumonia, urinary tract infection, acute cholecystitis, viral syndrome, cellulitis, tick bourne disease,  decubitus ulcer, necrotizing fasciitis, meningitis, encephalitis, influenza, etc. Differential diagnosis includes but is not exclusive to ectopic pregnancy, ovarian cyst, ovarian torsion, acute appendicitis, urinary tract infection, endometriosis, bowel obstruction, hernia, colitis, renal colic, gastroenteritis,  volvulus etc.   . Serious etiologies were considered.   Pt hypoxic on arrival, placed on [place] 4L with improvement , no home o2 use or home cpap use per family.   The initial plan is to screening labs/imaging/ivf/vasopressors if needed/bipap if needed/ct imaging/code sepsis   Additional history obtained: Additional history obtained from family Records reviewed  primary care notes, home meds, prior labs/imaging   Independent labs interpretation:  The following labs were independently interpreted:  WBC >20, she is tachycardic, hypotensive, tachypnea unclear source of infection but suspect GI vs GU Cr 2.7, baseline Cr WNL LFT's acutely elevated, concern for shock liver Initial sig acidosis, improving on blood gas Trop elev, concern for demand ischemia in setting of septic shock, resp fail  Independent visualization of imaging: - I independently visualized the following imaging with scope of interpretation limited to determining acute life threatening conditions related to emergency care: CXR, [place], which revealed ?pyelonephritis, hernia w/o evidence of obstruction  Cardiac monitoring was reviewed and interpreted by myself which shows sinus tachycardia  Treatment and Reassessment: Sepsis bolus Vanco/cefepime/flagyl Levophed NIPPV >> hemodynamics mildly improved   Consultation: - Consulted or discussed management/test interpretation w/ external professional: PCCM, will see pt in consult  Consideration for admission or further workup: Admission was considered   Pt with likely septic shock, shock liver, renal failure, unclear source of infection, perhaps pyelonephritis vs uti given CT findings. Started on vasopressors, broad spectrum abx. Pt with multi organ system failure,  recommend ICU admission. Spoke with Dr Regenia Skeeter who accepts pt for ED to ED transfer to expedite evaluation. PCCM will eval on pt arrival to ED at [facility].    Social Determinants of health: Social History    Tobacco Use   Smoking status: Never   Smokeless tobacco: Never  Vaping Use   Vaping Use: Never used  Substance Use Topics   Alcohol use: Yes    Comment: glass of wine occasionally   Drug use: No            Final Clinical Impression(s) / ED Diagnoses Final diagnoses:  Acute renal failure, unspecified acute renal failure type ([place])  Shock liver  Septic shock ([place])  H/O splenectomy    Rx / DC Orders ED Discharge Orders     None         Jeanell Sparrow, DO 08/01/2022 2034

## 2022-08-05 NOTE — ED Notes (Signed)
Felt bad yesterday, woke up this morning barely able to get up. Felt very lethargic. Family was barely able to get her in the vehicle to arrive at the hospital. Pt warm to the touch, responsive, CAOx4 and having severe back pain. Rates it about a 7/10. No numbness/tingling. Unable to urinate.

## 2022-08-05 NOTE — Progress Notes (Signed)
Pharmacy Antibiotic Note  Michelle Cohen is a 71 y.o. female admitted on 07/31/2022 presenting with concern for sepsis.  Pharmacy has been consulted for vancomycin and now changing cefepime to zosyn  Plan: -Zosyn 3.375gm IV x1 followed by 2.25gm IV q6h -Vancomycin variable dosing due to unstable renal function -Monitor renal function, Cx and clinical progression to narrow Vancomycin levels as indicated     Temp (24hrs), Avg:99.5 F (37.5 C), Min:98.2 F (36.8 C), Max:100.8 F (38.2 C)  Recent Labs  Lab 08/22/2022 1700 08/06/2022 1825  WBC 20.7*  --   CREATININE 2.74*  --   LATICACIDVEN >9.0* 10.2*     CrCl cannot be calculated (Unknown ideal weight.).    Allergies  Allergen Reactions   Allopurinol Rash    Whole body rash accompanied by swelling   Tape Itching and Rash    medipore tape    Hildred Laser, PharmD Clinical Pharmacist **Pharmacist phone directory can now be found on [email or web address] ([email or web address]).  Listed under [place].

## 2022-08-05 NOTE — ED Notes (Signed)
[facility] increased the Levo to 95mg/min from 144m/min upon their arrival.

## 2022-08-05 NOTE — ED Notes (Signed)
Report given to Charge RN.

## 2022-08-05 NOTE — ED Notes (Signed)
Provider gave a verbal order for 27mg/min.

## 2022-08-05 NOTE — ED Notes (Signed)
RT collected ABG post BIPAP placement with the following results. MD notified of Critical values. RT will continue to monitor while at [facility] ED.    Latest Reference Range & Units 08/04/2022 20:05  Sample type  ARTERIAL  pH, Arterial 7.35 - 7.45  7.319 (L)  pCO2 arterial 32 - 48 mmHg 21.4 (L)  pO2, Arterial 83 - 108 mmHg 121 (H)  TCO2 22 - 32 mmol/L 11 (L)  Acid-base deficit 0.0 - 2.0 mmol/L 13.0 (H)  Bicarbonate 20.0 - 28.0 mmol/L 10.8 (L)  O2 Saturation % 98  Patient temperature  101.8 F  Collection site  RADIAL, ALLEN'S TEST ACCEPTABLE

## 2022-08-05 NOTE — ED Notes (Signed)
[place] that was started by [facility] during transport stopped at 2110 per MD

## 2022-08-05 NOTE — Sepsis Progress Note (Signed)
Sepsis protocol monitored by [facility] 

## 2022-08-05 NOTE — ED Notes (Signed)
Registration advised that pt family has returned and is wondering when pt will be called. Pt had been dispo as LWBS after being called x 3 with no response. I will undo the discharge and triage RN will call pt next for triage.

## 2022-08-05 NOTE — Progress Notes (Signed)
Pharmacy Antibiotic Note  Maudine Kluesner is a 71 y.o. female admitted on 08/15/2022 presenting with concern for sepsis.  Pharmacy has been consulted for vancomycin and cefepime dosing.  Plan: Vancomycin 1500 mg IV x 1, then variable dosing due to unstable renal function Cefepime 2g IV q 24h Monitor renal function, Cx and clinical progression to narrow Vancomycin levels as indicated     Temp (24hrs), Avg:98.2 F (36.8 C), Min:98.2 F (36.8 C), Max:98.2 F (36.8 C)  Recent Labs  Lab 08/20/2022 1700 08/07/2022 1825  WBC 20.7*  --   CREATININE 2.74*  --   LATICACIDVEN >9.0* 10.2*    CrCl cannot be calculated (Unknown ideal weight.).    Allergies  Allergen Reactions   Allopurinol Rash    Whole body rash accompanied by swelling   Tape Itching and Rash    medipore tape    Bertis Ruddy, PharmD Clinical Pharmacist ED Pharmacist Phone # [phone] 08/25/2022 7:16 PM

## 2022-08-05 NOTE — H&P (Signed)
NAME:  Michelle Cohen, MRN:  [id number], DOB:  27-Nov-1950, LOS: 0 ADMISSION DATE:  08/04/2022, CONSULTATION DATE:  11/10 REFERRING MD:  Pearline Cables, CHIEF COMPLAINT:  nausea, vomiting, malase   History of Present Illness:   Michelle Cohen, is a 71 y.o. 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 No melena or BRBPR. Generalized weakness. Denies sick contacts and recent abx. Endorses nonproductive cough, dyspnea, rigors, subjective fever.  At [facility] she was found to be hypotensive (BP 57/47), tachycardic, WBC 20.7, lactate greater than 9, trop 157, creat 2.47. CT chest/abd/pel WO concerning for possible right pyelo. Code sepsis initiated. 2L LR ordered. Patient started on levophed. BC ordered. Vanc/Cefe/Flagyl ordered. She was started on BiPAP for WOB. ED to ED transfer initiated. Marland Kitchen  PCCM was consulted for admission   Pertinent  Medical History  B cell lymphoma and splenectomy   Significant Hospital Events: Including procedures, antibiotic start and stop dates in addition to other pertinent events   11/10 Presented to [facility], Cefepime, Vanc, flagyl>, BC>, Head ct Neg, CT chest/abd/pel wo- ?pyelo  Interim History / Subjective:  See above  Levophed 20 Mcg  Subjective: denies chest pain, sob, asking for water, feels "a little tired"  Objective   Blood pressure (!) 88/61, pulse (!) 118, temperature (!) 100.8 F (38.2 C), temperature source Axillary, resp. rate (!) 27, SpO2 98 %.    Vent Mode: BIPAP;PCV FiO2 (%):  [40 %] 40 % Set Rate:  [16 bmp] 16 bmp PEEP:  [5 cmH20] 5 cmH20   Intake/Output Summary (Last 24 hours) at 08/04/2022 2119 Last data filed at 07/31/2022 2002 Gross per 24 hour  Intake 3500.11 ml  Output --  Net 3500.11 ml   There were no vitals filed for this  visit.  Examination: General: In bed, on bipap, tachypnic HEENT: MM pink/dry, anicteric, atraumatic Neuro: RASS 0, PERRL 60m, GCS 15 CV: S1S2, ST, no m/r/g appreciated PULM:  clear in the upper lobes, clear in the lower lobes, trachea midline, chest expansion symmetric GI: soft, bsx4 active, non-tender   Extremities: cool/dry, no pretibial edema, capillary refill greater than 3 seconds  Skin: no rashes or lesions noted   Labs imaging  UA/UC pending Lactic 9>10.2 Troponin 157>184, BNP 312 ABG 7.31/21/121/10.8 Lipase WNL INR 1.6 WBC 20.7 HCT 50 PLT 105 K 3.4 Creat 2.74 (baseline 0.7-0.8) AST 690, ALT 493, Bili 2.9 C02 11, AG 28  12 lead: ST, no significant st changes noted CXR: no pneumo, infiltrate, or pleural effusion noted  CT chest per rads 1. No CT evidence for acute intrathoracic abnormality. 2. Hazy bilateral perinephric fat stranding and fat stranding at the renal pelvises left greater than right, findings are suggestive upper urinary tract infection/pyelonephritis. No hydronephrosis or obstructing stone. Correlate with urinalysis 3. Periumbilical ventral hernia now containing fat and loop of small bowel but no obstruction or incarceration. 4. Mild diverticular disease of the left colon without acute inflammatory process. Resolved Hospital Problem list     Assessment & Plan:  Septic Shock, suspect right pyelonephritis  Lactic acidosis, secondary to above AGMA, secondary to above Presented after acute N/V/D. CT abd with hazy bilateral perinephric fat stranding and fat stranding at therenal pelvises left greater than right, ?upper urinary tract infection/pyelonephritis. No hydronephrosis or obstructing stone. No significant infiltrate on CT chest. WBC  20. Lactate greater than 10. C02 11, AG 28. Glucose WNL. -Goal MAP 65 or greater. Levophed ordered. Titrate medication to goal. Will add on vasopressin. -S/P 2000 ml fluid resusitation. RN reports 4L total given per  nursing report -Follow up [facility] and UC - Cefepime, Vancomycin, flagyl started at [facility]. Narrow as cultures result -Trend lactate -Obtain ECHO -Close monitoring for intubation  Troponin elevation, suspect secondary to demand BNP elevation Troponin 157>184, BNP 312 -Cont tele -PRN EKG -Supportive care as above -ECHO -trend troponin  AKI Creat 2.74 (baseline 0.7-0.8) -Ensure renal perfusion. Goal MAP 65 or greater. -Avoid neprotoxic drugs as possible. -Strict I&O's -Follow up AM creatinine  Shock liver INR elevation Bilirubinemia AST 690, ALT 493, Bili 2.9. Per rads- Hepatobiliary: No focal liver abnormality is seen. No gallstones,gallbladder wall thickening, or biliary dilatation. -Trend  -supportive care as above  Hypokalemia PT with N/V/D -Replete -Follow up on AM labs  Thrombocytopenia  hx splenectomy, PLT 105, suspect secondary to sepsis -If no signs of active bleeding, consider PLT transfusion if less than 10K -Follow up AM PLT levels -Monitor for signs of active bleeding  Ventral hernia Seen on CT -Monitor abd exam   Best Practice (right click and "Reselect all SmartList Selections" daily)   Diet/type: NPO DVT prophylaxis: prophylactic heparin  GI prophylaxis: PPI Lines: N/A Foley:  Yes, and it is still needed Code Status:  full code Last date of multidisciplinary goals of care discussion [Full scope 11/10]  Labs   CBC: Recent Labs  Lab 08/23/2022 1700 07/30/2022 2005  WBC 20.7*  --   NEUTROABS 14.3*  --   HGB 16.3* 12.6  HCT 50.1* 37.0  MCV 98.4  --   PLT 105*  --     Basic Metabolic Panel: Recent Labs  Lab 08/12/2022 1700 08/01/2022 2005  NA 139 135  K 3.4* 3.5  CL 100  --   CO2 11*  --   GLUCOSE 79  --   BUN 34*  --   CREATININE 2.74*  --   CALCIUM 9.1  --    GFR: CrCl cannot be calculated (Unknown ideal weight.). Recent Labs  Lab 08/24/2022 1700 08/17/2022 1825  WBC 20.7*  --   LATICACIDVEN >9.0* 10.2*    Liver Function  Tests: Recent Labs  Lab 08/10/2022 1700  AST 690*  ALT 493*  ALKPHOS 103  BILITOT 2.9*  PROT 6.3*  ALBUMIN 3.9   Recent Labs  Lab 08/06/2022 1700  LIPASE 27   Recent Labs  Lab 07/28/2022 1825  AMMONIA 31    ABG    Component Value Date/Time   PHART 7.319 (L) 08/06/2022 2005   PCO2ART 21.4 (L) 08/13/2022 2005   PO2ART 121 (H) 08/17/2022 2005   HCO3 10.8 (L) 08/09/2022 2005   TCO2 11 (L) 07/27/2022 2005   ACIDBASEDEF 13.0 (H) 08/04/2022 2005   O2SAT 98 08/12/2022 2005     Coagulation Profile: Recent Labs  Lab 08/23/2022 1700  INR 1.6*    Cardiac Enzymes: No results for input(s): "CKTOTAL", "CKMB", "CKMBINDEX", "TROPONINI" in the last 168 hours.  HbA1C: No results found for: "HGBA1C"  CBG: No results for input(s): "GLUCAP" in the last 168 hours.  Review of Systems:   Positives in bold  Gen: fever, chills, weight change, fatigue, night sweats HEENT:  blurred vision, double vision, hearing loss, tinnitus, sinus congestion, rhinorrhea, sore throat, neck stiffness, dysphagia PULM:  shortness of breath, cough, sputum production, hemoptysis, wheezing CV: chest pain, edema, orthopnea, paroxysmal nocturnal dyspnea, palpitations  GI:  abdominal pain, nausea, vomiting, diarrhea, hematochezia, melena, constipation, change in bowel habits GU: dysuria, hematuria, polyuria, oliguria, urethral discharge Endocrine: hot or cold intolerance, polyuria, polyphagia or appetite change Derm: rash, dry skin, scaling or peeling skin change Heme: easy bruising, bleeding, bleeding gums Neuro: headache, numbness, weakness, slurred speech, loss of memory or consciousness   Past Medical History:  She,  has a past medical history of B-cell lymphoma ([place]) (06/2017).   Surgical History:   Past Surgical History:  Procedure Laterality Date   left inguinal hernia repair Left    SPLENECTOMY, TOTAL  02/13/2018     Social History:   reports that she has never smoked. She has never used  smokeless tobacco. She reports current alcohol use. She reports that she does not use drugs.   Family History:  Her family history includes Lung cancer in her sister.   Allergies Allergies  Allergen Reactions   Allopurinol Rash    Whole body rash accompanied by swelling   Tape Itching and Rash    medipore tape     Home Medications  Prior to Admission medications   Medication Sig Start Date End Date Taking? Authorizing Provider  acetaminophen (TYLENOL) 500 MG tablet Take 500 mg by mouth every 8 (eight) hours as needed for moderate pain.     [provider]  acyclovir (ZOVIRAX) 200 MG capsule TAKE 2 CAPSULES BY MOUTH TWICE DAILY 12/03/21 12/03/22  Brunetta Genera, MD  albuterol (PROVENTIL HFA;VENTOLIN HFA) 108 (90 Base) MCG/ACT inhaler Inhale 1-2 puffs into the lungs every 6 (six) hours as needed for wheezing or shortness of breath. 10/09/18   Melynda Ripple, MD  aspirin 81 MG EC tablet Take by mouth.    [provider]  augmented betamethasone dipropionate (DIPROLENE-AF) 0.05 % cream SMARTSIG:Sparingly Topical Twice Daily PRN 06/22/20   [provider]  benzonatate (TESSALON) 200 MG capsule Take 1 capsule (200 mg total) by mouth 3 (three) times daily as needed for cough. 10/09/18   Melynda Ripple, MD  chlorhexidine (PERIDEX) 0.12 % solution SWISH WITH 15 MLS IN MOUTH OR THROAT THEN SPIT --USE TWICE DAILY AS DIRECTED 04/29/22 04/29/23  Brunetta Genera, MD  chlorpheniramine-HYDROcodone [facility]) 10-8 MG/5ML SUER Take 5 mLs by mouth every 12 (twelve) hours as needed for cough. 10/09/18   Melynda Ripple, MD  Cyanocobalamin (VITAMIN B 12 PO) Take by mouth.    [provider]  Diphenhyd-Hydrocort-Nystatin (FIRST-DUKES MOUTHWASH MT) Take by mouth. 10/04/17   [provider]  docusate sodium (COLACE) 100 MG capsule Take 100 mg by mouth 2 (two) times daily.    [provider]  erythromycin ophthalmic ointment SMARTSIG:In  Eye(s) 04/27/20   [provider]  fluocinonide (LIDEX) 0.05 % external solution Apply 1 ml to the affected skin nightly as needed for inflammation 06/22/20   Harriett Sine, MD  fluticasone [facility]) 50 MCG/ACT nasal spray Place 2 sprays into both nostrils daily. 10/09/18   Melynda Ripple, MD  folic acid (FOLVITE) 1 MG tablet Take by mouth. 01/15/18   [provider]  hydrOXYzine (ATARAX/VISTARIL) 25 MG tablet Take 1-2 tablets (25-50 mg total) by mouth every 4 (four) hours as needed. 02/05/20   Brunetta Genera, MD  ketoconazole (NIZORAL) 2 % shampoo Apply 1 application topically 2 (two) times a week. 05/07/20   Brunetta Genera, MD  Loratadine 10 MG CAPS Take 10 mg by mouth daily.    [provider]  magic mouthwash w/lidocaine SOLN Take 5 mLs  by mouth 4 (four) times daily as needed for mouth pain. Swish and spit 01/20/20   Brunetta Genera, MD  moxifloxacin [facility]) 0.5 % ophthalmic solution  09/05/19   [provider]  oxyCODONE (OXY IR/ROXICODONE) 5 MG immediate release tablet Take by mouth.    [provider]  Prednisolon-Gatiflox-Bromfenac 1-0.5-0.075 % SUSP Place 1 drop into the right eye 4 (four) times daily. 07/31/19   [provider]  prednisoLONE acetate (PRED FORTE) 1 % ophthalmic suspension  09/05/19   [provider]  predniSONE (DELTASONE) 20 MG tablet Take 2 tablets (40 mg total) by mouth daily with breakfast. Starting 5 days prior to 1st dose of Rituxan 12/16/19   Brunetta Genera, MD  senna (SENOKOT) 8.6 MG TABS tablet Take 1 tablet by mouth daily as needed for mild constipation.     [provider]  Spacer/Aero-Holding Chambers (AEROCHAMBER PLUS) inhaler Use as instructed 10/09/18   Melynda Ripple, MD  traMADol (ULTRAM) 50 MG tablet Take 1 tablet (50 mg total) by mouth every 6 (six) hours as needed. 07/25/22     triamcinolone cream (KENALOG) 0.1 % Apply 1 application topically 2 (two) times  daily. 03/09/19   Wieters, Elesa Hacker, PA-C     Critical care time: 39 minutes    The patient is critically ill with multiple organ systems failure and requires high complexity decision making for assessment and support, frequent evaluation and titration of therapies, application of advanced monitoring technologies and extensive interpretation of multiple databases.    Critical Care Time devoted to patient care services described in this note is 39 minutes. This time reflects time of care of this [place] NP. This critical care time does not reflect procedure time but could involve care discussion time with the PCCM attending.  Redmond School., MSN, APRN, AGACNP-BC [facility]  08/02/2022 , 9:19 PM  Please see [email or web address] for pager details  If no response, please call [phone] After hours, please call Elink at [phone]

## 2022-08-05 NOTE — ED Notes (Signed)
Pt left alert but unable to respond through the Bipap (able to give answers Yes or No). No changes. Skin color did improve and so did responsiveness.

## 2022-08-06 ENCOUNTER — Inpatient Hospital Stay (HOSPITAL_COMMUNITY): Payer: Medicare Other

## 2022-08-06 DIAGNOSIS — R6521 Severe sepsis with septic shock: Secondary | ICD-10-CM | POA: Diagnosis not present

## 2022-08-06 DIAGNOSIS — K7200 Acute and subacute hepatic failure without coma: Secondary | ICD-10-CM | POA: Diagnosis not present

## 2022-08-06 DIAGNOSIS — A419 Sepsis, unspecified organism: Secondary | ICD-10-CM

## 2022-08-06 DIAGNOSIS — I214 Non-ST elevation (NSTEMI) myocardial infarction: Secondary | ICD-10-CM | POA: Insufficient documentation

## 2022-08-06 LAB — URINALYSIS, ROUTINE W REFLEX MICROSCOPIC
Bilirubin Urine: NEGATIVE
Glucose, UA: NEGATIVE mg/dL
Ketones, ur: NEGATIVE mg/dL
Leukocytes,Ua: NEGATIVE
Nitrite: NEGATIVE
Protein, ur: 100 mg/dL — AB
Specific Gravity, Urine: 1.016 (ref 1.005–1.030)
pH: 5 (ref 5.0–8.0)

## 2022-08-06 LAB — CBC
HCT: 36.1 % (ref 36.0–46.0)
HCT: 38.6 % (ref 36.0–46.0)
HCT: 39.5 % (ref 36.0–46.0)
Hemoglobin: 12.4 g/dL (ref 12.0–15.0)
Hemoglobin: 13.2 g/dL (ref 12.0–15.0)
Hemoglobin: 13.3 g/dL (ref 12.0–15.0)
MCH: 31.9 pg (ref 26.0–34.0)
MCH: 32 pg (ref 26.0–34.0)
MCH: 32.1 pg (ref 26.0–34.0)
MCHC: 33.7 g/dL (ref 30.0–36.0)
MCHC: 34.2 g/dL (ref 30.0–36.0)
MCHC: 34.3 g/dL (ref 30.0–36.0)
MCV: 92.8 fL (ref 80.0–100.0)
MCV: 93.7 fL (ref 80.0–100.0)
MCV: 95.4 fL (ref 80.0–100.0)
Platelets: 15 10*3/uL — CL (ref 150–400)
Platelets: 37 10*3/uL — ABNORMAL LOW (ref 150–400)
Platelets: 62 10*3/uL — ABNORMAL LOW (ref 150–400)
RBC: 3.89 MIL/uL (ref 3.87–5.11)
RBC: 4.12 MIL/uL (ref 3.87–5.11)
RBC: 4.14 MIL/uL (ref 3.87–5.11)
RDW: 13.6 % (ref 11.5–15.5)
RDW: 13.6 % (ref 11.5–15.5)
RDW: 13.9 % (ref 11.5–15.5)
WBC: 38.5 10*3/uL — ABNORMAL HIGH (ref 4.0–10.5)
WBC: 49.1 10*3/uL — ABNORMAL HIGH (ref 4.0–10.5)
WBC: 51 10*3/uL (ref 4.0–10.5)
nRBC: 0 % (ref 0.0–0.2)
nRBC: 0 % (ref 0.0–0.2)
nRBC: 0 % (ref 0.0–0.2)

## 2022-08-06 LAB — COMPREHENSIVE METABOLIC PANEL
ALT: 445 U/L — ABNORMAL HIGH (ref 0–44)
ALT: 300 U/L — ABNORMAL HIGH (ref 0–44)
AST: 497 U/L — ABNORMAL HIGH (ref 15–41)
AST: 325 U/L — ABNORMAL HIGH (ref 15–41)
Albumin: 2.5 g/dL — ABNORMAL LOW (ref 3.5–5.0)
Albumin: 2.3 g/dL — ABNORMAL LOW (ref 3.5–5.0)
Alkaline Phosphatase: 88 U/L (ref 38–126)
Alkaline Phosphatase: 73 U/L (ref 38–126)
Anion gap: 13 (ref 5–15)
Anion gap: 19 — ABNORMAL HIGH (ref 5–15)
BUN: 41 mg/dL — ABNORMAL HIGH (ref 8–23)
BUN: 47 mg/dL — ABNORMAL HIGH (ref 8–23)
CO2: 18 mmol/L — ABNORMAL LOW (ref 22–32)
CO2: 19 mmol/L — ABNORMAL LOW (ref 22–32)
Calcium: 7.7 mg/dL — ABNORMAL LOW (ref 8.9–10.3)
Calcium: 6.8 mg/dL — ABNORMAL LOW (ref 8.9–10.3)
Chloride: 106 mmol/L (ref 98–111)
Chloride: 100 mmol/L (ref 98–111)
Creatinine, Ser: 3.1 mg/dL — ABNORMAL HIGH (ref 0.44–1.00)
Creatinine, Ser: 3.64 mg/dL — ABNORMAL HIGH (ref 0.44–1.00)
GFR, Estimated: 16 mL/min — ABNORMAL LOW (ref >60)
GFR, Estimated: 13 mL/min — ABNORMAL LOW (ref >60)
Glucose, Bld: 142 mg/dL — ABNORMAL HIGH (ref 70–99)
Glucose, Bld: 199 mg/dL — ABNORMAL HIGH (ref 70–99)
Potassium: 2.9 mmol/L — ABNORMAL LOW (ref 3.5–5.1)
Potassium: 4.2 mmol/L (ref 3.5–5.1)
Sodium: 137 mmol/L (ref 135–145)
Sodium: 138 mmol/L (ref 135–145)
Total Bilirubin: 3.5 mg/dL — ABNORMAL HIGH (ref 0.3–1.2)
Total Bilirubin: 3.9 mg/dL — ABNORMAL HIGH (ref 0.3–1.2)
Total Protein: 4.5 g/dL — ABNORMAL LOW (ref 6.5–8.1)
Total Protein: 4.9 g/dL — ABNORMAL LOW (ref 6.5–8.1)

## 2022-08-06 LAB — POCT I-STAT 7, (LYTES, BLD GAS, ICA,H+H)
Acid-base deficit: 9 mmol/L — ABNORMAL HIGH (ref 0.0–2.0)
Acid-base deficit: 5 mmol/L — ABNORMAL HIGH (ref 0.0–2.0)
Bicarbonate: 18.1 mmol/L — ABNORMAL LOW (ref 20.0–28.0)
Bicarbonate: 20.1 mmol/L (ref 20.0–28.0)
Calcium, Ion: 1.13 mmol/L — ABNORMAL LOW (ref 1.15–1.40)
Calcium, Ion: 0.96 mmol/L — ABNORMAL LOW (ref 1.15–1.40)
HCT: 39 % (ref 36.0–46.0)
HCT: 36 % (ref 36.0–46.0)
Hemoglobin: 13.3 g/dL (ref 12.0–15.0)
Hemoglobin: 12.2 g/dL (ref 12.0–15.0)
O2 Saturation: 99 %
O2 Saturation: 99 %
Patient temperature: 97.9
Potassium: 3.1 mmol/L — ABNORMAL LOW (ref 3.5–5.1)
Potassium: 3.9 mmol/L (ref 3.5–5.1)
Sodium: 138 mmol/L (ref 135–145)
Sodium: 134 mmol/L — ABNORMAL LOW (ref 135–145)
TCO2: 19 mmol/L — ABNORMAL LOW (ref 22–32)
TCO2: 21 mmol/L — ABNORMAL LOW (ref 22–32)
pCO2 arterial: 43.3 mmHg (ref 32–48)
pCO2 arterial: 35.7 mmHg (ref 32–48)
pH, Arterial: 7.23 — ABNORMAL LOW (ref 7.35–7.45)
pH, Arterial: 7.356 (ref 7.35–7.45)
pO2, Arterial: 147 mmHg — ABNORMAL HIGH (ref 83–108)
pO2, Arterial: 147 mmHg — ABNORMAL HIGH (ref 83–108)

## 2022-08-06 LAB — BLOOD CULTURE ID PANEL (REFLEXED) - BCID2

## 2022-08-06 LAB — C DIFFICILE QUICK SCREEN W PCR REFLEX
C Diff antigen: NEGATIVE
C Diff interpretation: NOT DETECTED
C Diff toxin: NEGATIVE

## 2022-08-06 LAB — PROCALCITONIN: Procalcitonin: >150 ng/mL

## 2022-08-06 LAB — LACTIC ACID, PLASMA
Lactic Acid, Venous: 4.4 mmol/L (ref 0.5–1.9)
Lactic Acid, Venous: 4.7 mmol/L (ref 0.5–1.9)
Lactic Acid, Venous: 4.7 mmol/L (ref 0.5–1.9)
Lactic Acid, Venous: 5.6 mmol/L (ref 0.5–1.9)
Lactic Acid, Venous: 5.8 mmol/L (ref 0.5–1.9)
Lactic Acid, Venous: 6.1 mmol/L (ref 0.5–1.9)

## 2022-08-06 LAB — DIFFERENTIAL
Abs Immature Granulocytes: 2.51 10*3/uL — ABNORMAL HIGH (ref 0.00–0.07)
Basophils Absolute: 0 10*3/uL (ref 0.0–0.1)
Basophils Relative: 0 %
Eosinophils Absolute: 0 10*3/uL (ref 0.0–0.5)
Eosinophils Relative: 0 %
Immature Granulocytes: 5 %
Lymphocytes Relative: 2 %
Lymphs Abs: 0.8 10*3/uL (ref 0.7–4.0)
Monocytes Absolute: 1.5 10*3/uL — ABNORMAL HIGH (ref 0.1–1.0)
Monocytes Relative: 3 %
Neutro Abs: 44.1 10*3/uL — ABNORMAL HIGH (ref 1.7–7.7)
Neutrophils Relative %: 90 %

## 2022-08-06 LAB — POTASSIUM: Potassium: 4 mmol/L (ref 3.5–5.1)

## 2022-08-06 LAB — PROTIME-INR
INR: 3.1 — ABNORMAL HIGH (ref 0.8–1.2)
Prothrombin Time: 31.3 seconds — ABNORMAL HIGH (ref 11.4–15.2)

## 2022-08-06 LAB — DIC (DISSEMINATED INTRAVASCULAR COAGULATION)PANEL
D-Dimer, Quant: >20 ug/mL-FEU — ABNORMAL HIGH (ref 0.00–0.50)
Fibrinogen: 223 mg/dL (ref 210–475)
INR: 3.5 — ABNORMAL HIGH (ref 0.8–1.2)
Platelets: 33 10*3/uL — ABNORMAL LOW (ref 150–400)
Prothrombin Time: 35.2 seconds — ABNORMAL HIGH (ref 11.4–15.2)
Smear Review: NONE SEEN
aPTT: 71 seconds — ABNORMAL HIGH (ref 24–36)

## 2022-08-06 LAB — GLUCOSE, CAPILLARY
Glucose-Capillary: 105 mg/dL — ABNORMAL HIGH (ref 70–99)
Glucose-Capillary: 109 mg/dL — ABNORMAL HIGH (ref 70–99)
Glucose-Capillary: 110 mg/dL — ABNORMAL HIGH (ref 70–99)
Glucose-Capillary: 132 mg/dL — ABNORMAL HIGH (ref 70–99)
Glucose-Capillary: 186 mg/dL — ABNORMAL HIGH (ref 70–99)
Glucose-Capillary: 194 mg/dL — ABNORMAL HIGH (ref 70–99)
Glucose-Capillary: 55 mg/dL — ABNORMAL LOW (ref 70–99)
Glucose-Capillary: 56 mg/dL — ABNORMAL LOW (ref 70–99)
Glucose-Capillary: 97 mg/dL (ref 70–99)

## 2022-08-06 LAB — VANCOMYCIN, RANDOM: Vancomycin Rm: 17 ug/mL

## 2022-08-06 LAB — HIV ANTIBODY (ROUTINE TESTING W REFLEX): HIV Screen 4th Generation wRfx: NONREACTIVE

## 2022-08-06 LAB — HEMOGLOBIN A1C
Hgb A1c MFr Bld: 5.5 % (ref 4.8–5.6)
Mean Plasma Glucose: 111.15 mg/dL

## 2022-08-06 LAB — CREATININE, SERUM
Creatinine, Ser: 2.71 mg/dL — ABNORMAL HIGH (ref 0.44–1.00)
GFR, Estimated: 19 mL/min — ABNORMAL LOW (ref >60)

## 2022-08-06 LAB — ABO/RH: ABO/RH(D): A POS

## 2022-08-06 LAB — MRSA NEXT GEN BY PCR, NASAL: MRSA by PCR Next Gen: NOT DETECTED

## 2022-08-06 LAB — TROPONIN I (HIGH SENSITIVITY): Troponin I (High Sensitivity): >24000 ng/L (ref <18)

## 2022-08-06 LAB — TRIGLYCERIDES: Triglycerides: 178 mg/dL — ABNORMAL HIGH (ref <150)

## 2022-08-06 LAB — FERRITIN: Ferritin: 157 ng/mL (ref 11–307)

## 2022-08-06 MED ORDER — ACETAMINOPHEN 325 MG PO TABS: 650.0000 mg | ORAL_TABLET | Freq: Four times a day (QID) | ORAL | Status: DC | PRN

## 2022-08-06 MED ORDER — DEXTROSE 50 % IV SOLN
1.0000 | Freq: Once | INTRAVENOUS | Status: AC
  Administered 2022-08-06: 50 mL via INTRAVENOUS
  Filled 2022-08-06: qty 50

## 2022-08-06 MED ORDER — SODIUM CHLORIDE 0.9% FLUSH
10.0000 mL | Freq: Two times a day (BID) | INTRAVENOUS | Status: DC
  Administered 2022-08-06 (×2): 10 mL

## 2022-08-06 MED ORDER — ALBUMIN HUMAN 25 % IV SOLN
12.5000 g | Freq: Once | INTRAVENOUS | Status: AC
  Administered 2022-08-06: 12.5 g via INTRAVENOUS
  Filled 2022-08-06: qty 50

## 2022-08-06 MED ORDER — POTASSIUM CHLORIDE 10 MEQ/50ML IV SOLN
10.0000 meq | INTRAVENOUS | Status: AC
  Administered 2022-08-06 (×2): 10 meq via INTRAVENOUS
  Filled 2022-08-06 (×2): qty 50

## 2022-08-06 MED ORDER — FENTANYL CITRATE PF 50 MCG/ML IJ SOSY
50.0000 ug | PREFILLED_SYRINGE | Freq: Once | INTRAMUSCULAR | Status: AC
  Administered 2022-08-06: 50 ug via INTRAVENOUS

## 2022-08-06 MED ORDER — PROPOFOL 1000 MG/100ML IV EMUL
0.0000 ug/kg/min | INTRAVENOUS | Status: DC
  Administered 2022-08-06: 5 ug/kg/min via INTRAVENOUS
  Administered 2022-08-06: 10 ug/kg/min via INTRAVENOUS
  Administered 2022-08-07 – 2022-08-08 (×2): 20 ug/kg/min via INTRAVENOUS
  Filled 2022-08-06 (×4): qty 100

## 2022-08-06 MED ORDER — NOREPINEPHRINE 16 MG/250ML-% IV SOLN
0.0000 ug/min | INTRAVENOUS | Status: DC
  Administered 2022-08-06 (×2): 40 ug/min via INTRAVENOUS
  Filled 2022-08-06 (×2): qty 250

## 2022-08-06 MED ORDER — POLYETHYLENE GLYCOL 3350 17 G PO PACK
17.0000 g | PACK | Freq: Every day | ORAL | Status: DC
  Administered 2022-08-06: 17 g
  Filled 2022-08-06: qty 1

## 2022-08-06 MED ORDER — GENTAMICIN SULFATE 40 MG/ML IJ SOLN
5.0000 mg/kg | INTRAVENOUS | Status: AC
  Administered 2022-08-06: 370 mg via INTRAVENOUS
  Filled 2022-08-06: qty 9.25

## 2022-08-06 MED ORDER — NOREPINEPHRINE 16 MG/250ML-% IV SOLN
0.0000 ug/min | INTRAVENOUS | Status: DC
  Administered 2022-08-06: 40 ug/min via INTRAVENOUS
  Administered 2022-08-06: 38 ug/min via INTRAVENOUS
  Administered 2022-08-07: 33 ug/min via INTRAVENOUS
  Administered 2022-08-07: 35 ug/min via INTRAVENOUS
  Filled 2022-08-06 (×4): qty 250

## 2022-08-06 MED ORDER — ALBUMIN HUMAN 5 % IV SOLN
12.5000 g | Freq: Once | INTRAVENOUS | Status: AC
  Administered 2022-08-06: 12.5 g via INTRAVENOUS
  Filled 2022-08-06: qty 250

## 2022-08-06 MED ORDER — SODIUM CHLORIDE 0.9% IV SOLUTION: Freq: Once | INTRAVENOUS | Status: DC

## 2022-08-06 MED ORDER — CALCIUM CHLORIDE 10 % IV SOLN
INTRAVENOUS | Status: AC
  Administered 2022-08-06: 1000 mg
  Filled 2022-08-06: qty 10

## 2022-08-06 MED ORDER — SODIUM BICARBONATE 8.4 % IV SOLN
INTRAVENOUS | Status: AC
  Administered 2022-08-06: 100 meq via INTRAVENOUS
  Filled 2022-08-06: qty 100

## 2022-08-06 MED ORDER — ETOMIDATE 2 MG/ML IV SOLN
INTRAVENOUS | Status: AC
  Filled 2022-08-06: qty 20

## 2022-08-06 MED ORDER — LACTATED RINGERS IV BOLUS
1000.0000 mL | Freq: Once | INTRAVENOUS | Status: AC
  Administered 2022-08-06: 1000 mL via INTRAVENOUS

## 2022-08-06 MED ORDER — FENTANYL BOLUS VIA INFUSION
50.0000 ug | INTRAVENOUS | Status: DC | PRN
  Administered 2022-08-06: 50 ug via INTRAVENOUS
  Administered 2022-08-06: 100 ug via INTRAVENOUS
  Administered 2022-08-06 (×4): 50 ug via INTRAVENOUS
  Administered 2022-08-06 – 2022-08-07 (×2): 100 ug via INTRAVENOUS

## 2022-08-06 MED ORDER — SODIUM CHLORIDE 0.9% FLUSH: 10.0000 mL | INTRAVENOUS | Status: DC | PRN

## 2022-08-06 MED ORDER — SODIUM BICARBONATE 8.4 % IV SOLN: 100.0000 meq | Freq: Once | INTRAVENOUS | Status: DC

## 2022-08-06 MED ORDER — ORAL CARE MOUTH RINSE: 15.0000 mL | OROMUCOSAL | Status: DC | PRN

## 2022-08-06 MED ORDER — NOREPINEPHRINE 4 MG/250ML-% IV SOLN
INTRAVENOUS | Status: AC
  Filled 2022-08-06: qty 250

## 2022-08-06 MED ORDER — SODIUM CHLORIDE 0.9 % IV SOLN
1.0000 g | Freq: Two times a day (BID) | INTRAVENOUS | Status: DC
  Administered 2022-08-06 (×2): 1 g via INTRAVENOUS
  Filled 2022-08-06 (×2): qty 20

## 2022-08-06 MED ORDER — EPINEPHRINE 1 MG/10ML IJ SOSY
PREFILLED_SYRINGE | INTRAMUSCULAR | Status: AC
  Filled 2022-08-06: qty 10

## 2022-08-06 MED ORDER — MIDAZOLAM HCL 2 MG/2ML IJ SOLN
1.0000 mg | INTRAMUSCULAR | Status: DC | PRN
  Administered 2022-08-06: 2 mg via INTRAVENOUS
  Filled 2022-08-06: qty 2

## 2022-08-06 MED ORDER — SODIUM BICARBONATE 8.4 % IV SOLN
50.0000 meq | Freq: Once | INTRAVENOUS | Status: AC
  Administered 2022-08-06: 50 meq via INTRAVENOUS

## 2022-08-06 MED ORDER — DOCUSATE SODIUM 50 MG/5ML PO LIQD
100.0000 mg | Freq: Two times a day (BID) | ORAL | Status: DC
  Administered 2022-08-06 (×2): 100 mg
  Filled 2022-08-06 (×2): qty 10

## 2022-08-06 MED ORDER — DEXMEDETOMIDINE HCL IN NACL 400 MCG/100ML IV SOLN
INTRAVENOUS | Status: AC
  Administered 2022-08-06: 0.4 ug/kg/h via INTRAVENOUS
  Filled 2022-08-06: qty 100

## 2022-08-06 MED ORDER — ONDANSETRON HCL 4 MG/2ML IJ SOLN: 4.0000 mg | Freq: Once | INTRAMUSCULAR | Status: AC

## 2022-08-06 MED ORDER — ORAL CARE MOUTH RINSE: 15.0000 mL | OROMUCOSAL | Status: DC

## 2022-08-06 MED ORDER — ONDANSETRON HCL 4 MG/2ML IJ SOLN
INTRAMUSCULAR | Status: AC
  Administered 2022-08-06: 4 mg via INTRAVENOUS
  Filled 2022-08-06: qty 2

## 2022-08-06 MED ORDER — DEXMEDETOMIDINE HCL IN NACL 400 MCG/100ML IV SOLN: 0.4000 ug/kg/h | INTRAVENOUS | Status: DC

## 2022-08-06 MED ORDER — METHYLENE BLUE 1 % INJ SOLN
2.0000 mg/kg | Status: AC
  Administered 2022-08-06: 149 mg via INTRAVENOUS
  Filled 2022-08-06: qty 14.9

## 2022-08-06 MED ORDER — IMMUNE GLOBULIN (HUMAN) 10 GM/100ML IV SOLN
400.0000 mg/kg | INTRAVENOUS | Status: DC
  Administered 2022-08-06: 5 g via INTRAVENOUS
  Filled 2022-08-06 (×2): qty 250

## 2022-08-06 MED ORDER — INSULIN ASPART 100 UNIT/ML IJ SOLN
0.0000 [IU] | INTRAMUSCULAR | Status: DC
  Administered 2022-08-06 – 2022-08-07 (×3): 2 [IU] via SUBCUTANEOUS

## 2022-08-06 MED ORDER — ROCURONIUM BROMIDE 10 MG/ML (PF) SYRINGE
PREFILLED_SYRINGE | INTRAVENOUS | Status: AC
  Filled 2022-08-06: qty 10

## 2022-08-06 MED ORDER — SODIUM BICARBONATE 8.4 % IV SOLN
INTRAVENOUS | Status: DC
  Filled 2022-08-06 (×4): qty 1000

## 2022-08-06 MED ORDER — FENTANYL 2500MCG IN NS 250ML (10MCG/ML) PREMIX INFUSION
50.0000 ug/h | INTRAVENOUS | Status: DC
  Administered 2022-08-06: 50 ug/h via INTRAVENOUS
  Administered 2022-08-07: 150 ug/h via INTRAVENOUS
  Filled 2022-08-06 (×2): qty 250

## 2022-08-06 MED ORDER — ORAL CARE MOUTH RINSE
15.0000 mL | OROMUCOSAL | Status: DC
  Administered 2022-08-06 – 2022-08-07 (×11): 15 mL via OROMUCOSAL

## 2022-08-06 MED ORDER — SODIUM ZIRCONIUM CYCLOSILICATE 10 G PO PACK
10.0000 g | PACK | Freq: Once | ORAL | Status: AC
  Administered 2022-08-06: 10 g
  Filled 2022-08-06: qty 1

## 2022-08-06 MED ORDER — DEXMEDETOMIDINE HCL IN NACL 400 MCG/100ML IV SOLN
0.4000 ug/kg/h | INTRAVENOUS | Status: DC
  Administered 2022-08-06: 0.4 ug/kg/h via INTRAVENOUS
  Administered 2022-08-06 (×2): 0.9 ug/kg/h via INTRAVENOUS
  Administered 2022-08-06: 0.6 ug/kg/h via INTRAVENOUS
  Filled 2022-08-06 (×3): qty 100

## 2022-08-06 MED ORDER — VASOPRESSIN 20 UNITS/100 ML INFUSION FOR SHOCK
0.0000 [IU]/min | INTRAVENOUS | Status: DC
  Administered 2022-08-06 (×2): 0.03 [IU]/min via INTRAVENOUS
  Filled 2022-08-06 (×2): qty 100

## 2022-08-06 MED ORDER — PANTOPRAZOLE SODIUM 40 MG IV SOLR
40.0000 mg | Freq: Every day | INTRAVENOUS | Status: DC
  Administered 2022-08-06: 40 mg via INTRAVENOUS
  Filled 2022-08-06: qty 10

## 2022-08-06 MED ORDER — VASOPRESSIN 20 UNITS/100 ML INFUSION FOR SHOCK
0.0000 [IU]/min | INTRAVENOUS | Status: DC
  Administered 2022-08-06 – 2022-08-07 (×4): 0.04 [IU]/min via INTRAVENOUS
  Filled 2022-08-06 (×4): qty 100

## 2022-08-06 NOTE — Progress Notes (Signed)
Pharmacy Antibiotic Note  Michelle Cohen is a 71 y.o. female admitted on 07/31/2022 with bacteremia.  Pharmacy has been consulted for Meropenem dosing. Patient with an AKI - Current CrCL 18 ml/min.  Plan: Meropenem 1 gm IV q 12hr Monitor renal function, clinical status and C&S.  Height: '5\' 5"'$  (165.1 cm) Weight: 74.4 kg (164 lb) IBW/kg (Calculated) : 57  Temp (24hrs), Avg:98.8 F (37.1 C), Min:97.8 F (36.6 C), Max:101.6 F (38.7 C)  Recent Labs  Lab 07/28/2022 1700 08/17/2022 1825 08/15/2022 2357 08/06/22 0455 08/06/22 0456 08/06/22 1242  WBC 20.7*  --  38.5* 49.1*  --   --   CREATININE 2.74*  --  2.71* 3.10*  --   --   LATICACIDVEN >9.0* 10.2* 6.1*  --  4.7* 4.7*    Estimated Creatinine Clearance: 17.5 mL/min (A) (by C-G formula based on SCr of 3.1 mg/dL (H)).    Allergies  Allergen Reactions   Allopurinol Rash    Whole body rash accompanied by swelling   Tape Itching and Rash    medipore tape    Antimicrobials this admission: Vanc 11/10 >>  Zosyn  11/10 >> 11/11 Merrem 11/11>>  Thank you for allowing pharmacy to be a part of this patient's care.  Alanda Slim, PharmD, [facility] Clinical Pharmacist Please see AMION for all Pharmacists' Contact Phone Numbers 08/06/2022, 2:08 PM

## 2022-08-06 NOTE — Consult Note (Signed)
[facility]  Total days of antibiotics 2         Reason for Consult: severe GNR sepsis in immunocompromised host   Referring Physician: Tamala Julian  Principal Problem:   Septic shock ([place]) Active Problems:   Shock liver    HPI: Michelle Cohen is a 71 y.o. 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 Infectious work up showing [place] and CT concerning for right sided pyelonephritis. Due increasing vasopressor requirements her abtx coverage escalated from vanco/cefepime/metronidazole plus addition of gentamicin. She is not colonized with MRSA. UA showing some pyuria, many bacteria but negative nitrites. ID asked to weigh in on abtx  Spouse, Kindrick Lankford, mentioned Michelle Cohen has been in good health, walked 10 miles prior to presenting with fever,chills,rigors, hypotension. She is in overall good health. Had right upper arm lipoma removed roughly 10 days ago, without issue. Has not had any hospitalization related to infection/sepsis since splenectomy in 2019.   Patient is active, enjoys gardening at healing [employer].   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    Allergies:  Allergies  Allergen Reactions   Allopurinol Rash    Whole body rash accompanied by swelling   Tape Itching and Rash    medipore tape    Current antibiotics:   MEDICATIONS:  Chlorhexidine Gluconate Cloth  6 each Topical Daily   docusate  100 mg Per Tube BID   hydrocortisone sod succinate (SOLU-CORTEF) inj  100 mg Intravenous Q12H   insulin aspart  0-9 Units Subcutaneous Q4H   mouth rinse  15 mL Mouth Rinse Q2H    pantoprazole (PROTONIX) IV  40 mg Intravenous Daily   polyethylene glycol  17 g Per Tube Daily   sodium bicarbonate  100 mEq Intravenous Once   sodium chloride flush  10-40 mL Intracatheter Q12H   vancomycin variable dose per unstable renal function (pharmacist dosing)   Does not apply See admin instructions    Social History   Tobacco Use   Smoking status: Never   Smokeless tobacco: Never  Vaping Use   Vaping Use: Never used  Substance Use Topics   Alcohol use: Yes    Comment: glass of wine occasionally   Drug use: No    Family History  Problem Relation Age of Onset   Lung cancer Sister     Review of Systems - unable to obtain since patient is sedated/intubated   OBJECTIVE: Temp:  [97.8 F (36.6 C)-101.6 F (38.7 C)] 97.9 F (36.6 C) (11/11 1147) Pulse Rate:  [82-237] 82 (11/11 1113) Resp:  [18-43] 27 (11/11 1113) BP: (57-127)/(38-102) 87/55 (11/11 0600) SpO2:  [89 %-100 %] 100 % (11/11 1113) Arterial Line BP: (97-145)/(56-70) 108/69 (11/11 1113) FiO2 (%):  [40 %-100 %] 70 % (11/11 0840) Weight:  [74.4 kg] 74.4 kg (11/11 0400) Physical Exam  Constitutional:  intubated/sedated appears well-developed and well-nourished. No distress.  HENT: [place]/AT, PERRLA, no scleral icterus Mouth/Throat: OETT in place Cardiovascular: Normal rate, regular rhythm and normal heart sounds. Exam reveals no gallop and no friction rub.  No murmur heard.  Pulmonary/Chest: Effort normal and breath sounds normal. No respiratory  distress.  has no wheezes.  Neck = supple, no nuchal rigidity Abdominal: Soft. Bowel sounds are normal.  exhibits no distension. There is no tenderness.  Lymphadenopathy: no cervical adenopathy. No axillary adenopathy Ext= ecchymosis about incision of right upper arm but no oozing.cool extremities, slow cap refill. Skin: Skin is warm and dry. Cyanotic hands, 2nd and 3rd digit of right hand mottled Psychiatric: a normal mood and affect.  behavior is normal.    LABS: Results for orders placed or performed during the hospital encounter of 08/21/2022 (from the past 48 hour(s))  Culture, blood (Routine x 2)     Status: None (Preliminary result)   Collection Time: 07/31/2022  4:50 PM   Specimen: BLOOD RIGHT WRIST  Result Value Ref Range   Specimen Description      BLOOD RIGHT WRIST Performed at [facility] [address], [place], [place] [address]    Special Requests      Blood Culture adequate volume BOTTLES DRAWN AEROBIC AND ANAEROBIC Performed at [facility], [address] [place], [place] [address]    Culture  Setup Time      GRAM NEGATIVE RODS IN BOTH AEROBIC AND ANAEROBIC BOTTLES CRITICAL VALUE NOTED.  VALUE IS CONSISTENT WITH PREVIOUSLY REPORTED AND CALLED VALUE. Performed at [facility], [place] [address] [place], [place] [address]    Culture GRAM NEGATIVE RODS    Report Status PENDING   Culture, blood (Routine x 2)     Status: None (Preliminary result)   Collection Time: 08/16/2022  4:58 PM   Specimen: BLOOD RIGHT WRIST  Result Value Ref Range   Specimen Description      BLOOD RIGHT WRIST Performed at [facility] [address], [place], [place] [address]    Special Requests      Blood Culture adequate volume BOTTLES DRAWN AEROBIC AND ANAEROBIC Performed at [facility], [place], [place] [address]    Culture  Setup Time      GRAM NEGATIVE RODS IN BOTH AEROBIC AND ANAEROBIC BOTTLES CRITICAL RESULT CALLED TO, READ BACK BY AND VERIFIED WITH:  C/ PHARMD C. PIERCE 08/06/22 1330 A. LAFRANCE Performed at [facility], [place] [address], [place], [place] [address]    Culture GRAM NEGATIVE RODS    Report Status PENDING   Blood Culture ID Panel (Reflexed)     Status: None   Collection Time: 08/23/2022  4:58 PM  Result Value Ref Range   Enterococcus faecalis NOT DETECTED NOT DETECTED   Enterococcus Faecium NOT DETECTED NOT DETECTED   Listeria  monocytogenes NOT DETECTED NOT DETECTED   Staphylococcus species NOT DETECTED NOT DETECTED   Staphylococcus aureus (BCID) NOT DETECTED NOT DETECTED   Staphylococcus epidermidis NOT DETECTED NOT DETECTED   Staphylococcus lugdunensis NOT DETECTED NOT DETECTED   Streptococcus species NOT DETECTED NOT DETECTED   Streptococcus agalactiae NOT DETECTED NOT DETECTED   Streptococcus pneumoniae NOT DETECTED NOT DETECTED   Streptococcus pyogenes NOT DETECTED NOT DETECTED   A.calcoaceticus-baumannii NOT DETECTED NOT DETECTED   Bacteroides fragilis NOT DETECTED NOT DETECTED   Enterobacterales NOT DETECTED NOT DETECTED   Enterobacter cloacae complex NOT DETECTED NOT DETECTED   Escherichia coli NOT DETECTED NOT DETECTED   Klebsiella aerogenes NOT DETECTED NOT DETECTED   Klebsiella oxytoca NOT DETECTED NOT DETECTED   Klebsiella pneumoniae NOT DETECTED NOT DETECTED   Proteus species NOT DETECTED NOT DETECTED   Salmonella species NOT DETECTED NOT DETECTED   Serratia marcescens  NOT DETECTED NOT DETECTED   Haemophilus influenzae NOT DETECTED NOT DETECTED   Neisseria meningitidis NOT DETECTED NOT DETECTED   Pseudomonas aeruginosa NOT DETECTED NOT DETECTED   Stenotrophomonas maltophilia NOT DETECTED NOT DETECTED   Candida albicans NOT DETECTED NOT DETECTED   Candida auris NOT DETECTED NOT DETECTED   Candida glabrata NOT DETECTED NOT DETECTED   Candida krusei NOT DETECTED NOT DETECTED   Candida parapsilosis NOT DETECTED NOT DETECTED   Candida tropicalis NOT DETECTED NOT DETECTED   Cryptococcus neoformans/gattii NOT DETECTED NOT DETECTED    Comment: Performed at [facility], [place] [address], [place], [place] [address]  Comprehensive metabolic panel     Status: Abnormal   Collection Time: 08/09/2022  5:00 PM  Result Value Ref Range   Sodium 139 135 - 145 mmol/L    Comment: Electrolytes repeated to confirm.   Potassium 3.4 (L) 3.5 - 5.1 mmol/L    Comment: Electrolytes repeated to confirm.   Chloride  100 98 - 111 mmol/L    Comment: Electrolytes repeated to confirm.   CO2 11 (L) 22 - 32 mmol/L    Comment: Electrolytes repeated to confirm.   Glucose, Bld 79 70 - 99 mg/dL    Comment: Glucose reference range applies only to samples taken after fasting for at least 8 hours.   BUN 34 (H) 8 - 23 mg/dL   Creatinine, Ser 2.74 (H) 0.44 - 1.00 mg/dL   Calcium 9.1 8.9 - 10.3 mg/dL   Total Protein 6.3 (L) 6.5 - 8.1 g/dL   Albumin 3.9 3.5 - 5.0 g/dL   AST 690 (H) 15 - 41 U/L   ALT 493 (H) 0 - 44 U/L   Alkaline Phosphatase 103 38 - 126 U/L   Total Bilirubin 2.9 (H) 0.3 - 1.2 mg/dL   GFR, Estimated 18 (L) >60 mL/min    Comment: (NOTE) Calculated using the CKD-EPI Creatinine Equation (2021)    Anion gap 28 (H) 5 - 15    Comment: REPEATED TO VERIFY Performed at [facility], [place], [place] [address]   Lactic acid, plasma     Status: Abnormal   Collection Time: 08/11/2022  5:00 PM  Result Value Ref Range   Lactic Acid, Venous >9.0 (HH) 0.5 - 1.9 mmol/L    Comment: CRITICAL RESULT CALLED TO, READ BACK BY AND VERIFIED WITH: leslie schular, rn 1743 src 11/10  Performed at [facility], [address], [place], [place] [address]   CBC with Differential     Status: Abnormal   Collection Time: 08/17/2022  5:00 PM  Result Value Ref Range   WBC 20.7 (H) 4.0 - 10.5 K/uL   RBC 5.09 3.87 - 5.11 MIL/uL   Hemoglobin 16.3 (H) 12.0 - 15.0 g/dL   HCT 50.1 (H) 36.0 - 46.0 %   MCV 98.4 80.0 - 100.0 fL   MCH 32.0 26.0 - 34.0 pg   MCHC 32.5 30.0 - 36.0 g/dL   RDW 13.6 11.5 - 15.5 %   Platelets 105 (L) 150 - 400 K/uL   nRBC 0.0 0.0 - 0.2 %   Neutrophils Relative % 69 %   Neutro Abs 14.3 (H) 1.7 - 7.7 K/uL   Lymphocytes Relative 20 %   Lymphs Abs 4.1 (H) 0.7 - 4.0 K/uL   Monocytes Relative 1 %   Monocytes Absolute 0.2 0.1 - 1.0 K/uL   Eosinophils Relative 1 %   Eosinophils Absolute 0.2 0.0 - 0.5 K/uL   Basophils  Relative 0 %   Basophils Absolute  0.0 0.0 - 0.1 K/uL   WBC Morphology INCREASED BANDS (>20% BANDS)     Comment: MODERATE LEFT SHIFT (>5% METAS AND MYELOS,OCC PRO NOTED)   RBC Morphology Acanthocytes present    Other BACTERIA PRESENT, CONFIRMED BY REDRAW. %    Comment: CALLED TO J BARWICK,RN 1853 08/17/2022 DB   Metamyelocytes Relative 5 %   Myelocytes 4 %   Abs Immature Granulocytes 1.90 (H) 0.00 - 0.07 K/uL   Tear Drop Cells PRESENT    Tammy Sours Bodies PRESENT    Giant PLTs PRESENT     Comment: Performed at [facility], [address], [place], [place] [address]  Protime-INR     Status: Abnormal   Collection Time: 08/15/2022  5:00 PM  Result Value Ref Range   Prothrombin Time 18.7 (H) 11.4 - 15.2 seconds   INR 1.6 (H) 0.8 - 1.2    Comment: (NOTE) INR goal varies based on device and disease states. Performed at [facility], [address] [place], [place] [address]   APTT     Status: Abnormal   Collection Time: 08/06/2022  5:00 PM  Result Value Ref Range   aPTT 47 (H) 24 - 36 seconds    Comment:        IF BASELINE aPTT IS ELEVATED, SUGGEST PATIENT RISK ASSESSMENT BE USED TO DETERMINE APPROPRIATE ANTICOAGULANT THERAPY. Performed at [facility], [address] [place], [place] [address]   Troponin I (High Sensitivity)     Status: Abnormal   Collection Time: 08/15/2022  5:00 PM  Result Value Ref Range   Troponin I (High Sensitivity) 157 (HH) <18 ng/L    Comment: CRITICAL RESULT CALLED TO, READ BACK BY AND VERIFIED WITH: leslie schular, rn 1802 src 11/10 (NOTE) Elevated high sensitivity troponin I (hsTnI) values and significant  changes across serial measurements may suggest ACS but many other  chronic and acute conditions are known to elevate hsTnI results.  Refer to the Links section for chest pain algorithms and additional  guidance. Performed at [facility], [address] [place], [place] [address]   Lipase, blood      Status: None   Collection Time: 07/31/2022  5:00 PM  Result Value Ref Range   Lipase 27 11 - 51 U/L    Comment: Performed at [facility], [address], [place], [place] [address]  Brain natriuretic peptide     Status: Abnormal   Collection Time: 08/24/2022  5:00 PM  Result Value Ref Range   B Natriuretic Peptide 312.6 (H) 0.0 - 100.0 pg/mL    Comment: Performed at [facility], [address] [place], [place] [address]  Resp Panel by RT-PCR (Flu A&B, Covid) Anterior Nasal Swab     Status: None   Collection Time: 07/31/2022  5:34 PM   Specimen: Anterior Nasal Swab  Result Value Ref Range   SARS Coronavirus 2 by RT PCR NEGATIVE NEGATIVE    Comment: (NOTE) SARS-CoV-2 target nucleic acids are NOT DETECTED.  The SARS-CoV-2 RNA is generally detectable in upper respiratory specimens during the acute phase of infection. The lowest concentration of SARS-CoV-2 viral copies this assay can detect is 138 copies/mL. A negative result does not preclude SARS-Cov-2 infection and should not be used as the sole basis for treatment or other patient management decisions. A negative result may occur with  improper specimen collection/handling, submission of specimen other than nasopharyngeal swab, presence of viral mutation(s)  within the areas targeted by this assay, and inadequate number of viral copies(<138 copies/mL). A negative result must be combined with clinical observations, patient history, and epidemiological information. The expected result is Negative.  Fact Sheet for Patients:  EntrepreneurPulse.com.au  Fact Sheet for Healthcare Providers:  IncredibleEmployment.be  This test is no t yet approved or cleared by the [place] FDA and  has been authorized for detection and/or diagnosis of SARS-CoV-2 by FDA under an Emergency Use Authorization (EUA). This EUA will remain  in effect (meaning this test can be used)  for the duration of the COVID-19 declaration under Section 564(b)(1) of the Act, 21 U.S.C.section 360bbb-3(b)(1), unless the authorization is terminated  or revoked sooner.       Influenza A by PCR NEGATIVE NEGATIVE   Influenza B by PCR NEGATIVE NEGATIVE    Comment: (NOTE) The Xpert Xpress SARS-CoV-2/FLU/RSV plus assay is intended as an aid in the diagnosis of influenza from Nasopharyngeal swab specimens and should not be used as a sole basis for treatment. Nasal washings and aspirates are unacceptable for Xpert Xpress SARS-CoV-2/FLU/RSV testing.  Fact Sheet for Patients: EntrepreneurPulse.com.au  Fact Sheet for Healthcare Providers: IncredibleEmployment.be  This test is not yet approved or cleared by the [place] FDA and has been authorized for detection and/or diagnosis of SARS-CoV-2 by FDA under an Emergency Use Authorization (EUA). This EUA will remain in effect (meaning this test can be used) for the duration of the COVID-19 declaration under Section 564(b)(1) of the Act, 21 U.S.C. section 360bbb-3(b)(1), unless the authorization is terminated or revoked.  Performed at [facility], [address] [place], [place] [address]   Lactic acid, plasma     Status: Abnormal   Collection Time: 08/24/2022  6:25 PM  Result Value Ref Range   Lactic Acid, Venous 10.2 (HH) 0.5 - 1.9 mmol/L    Comment: CRITICAL RESULT CALLED TO, READ BACK BY AND VERIFIED WITHJohnnette Gourd 1901 08/04/2022 DBRADLEY Performed at [facility], [address] [place], [place] [address]   Ammonia     Status: None   Collection Time: 08/12/2022  6:25 PM  Result Value Ref Range   Ammonia 31 9 - 35 umol/L    Comment: Performed at [facility], [address], [place], [place] [address]  TSH     Status: None   Collection Time: 08/09/2022  6:25 PM  Result Value Ref Range   TSH 2.092 0.350 - 4.500 uIU/mL     Comment: Performed by a 3rd Generation assay with a functional sensitivity of <=0.01 uIU/mL. Performed at [facility], [address], [place], [place] [address]   Troponin I (High Sensitivity)     Status: Abnormal   Collection Time: 08/02/2022  6:25 PM  Result Value Ref Range   Troponin I (High Sensitivity) 184 (HH) <18 ng/L    Comment: CRITICAL VALUE NOTED.  VALUE IS CONSISTENT WITH PREVIOUSLY REPORTED AND CALLED VALUE. (NOTE) Elevated high sensitivity troponin I (hsTnI) values and significant  changes across serial measurements may suggest ACS but many other  chronic and acute conditions are known to elevate hsTnI results.  Refer to the Links section for chest pain algorithms and additional  guidance. Performed at [facility], [address] [place], [place] [address]   I-Stat arterial blood gas, ED     Status: Abnormal   Collection Time: 08/02/2022  8:05 PM  Result Value Ref Range   pH, Arterial 7.319 (L) 7.35 - 7.45  pCO2 arterial 21.4 (L) 32 - 48 mmHg   pO2, Arterial 121 (H) 83 - 108 mmHg   Bicarbonate 10.8 (L) 20.0 - 28.0 mmol/L   TCO2 11 (L) 22 - 32 mmol/L   O2 Saturation 98 %   Acid-base deficit 13.0 (H) 0.0 - 2.0 mmol/L   Sodium 135 135 - 145 mmol/L   Potassium 3.5 3.5 - 5.1 mmol/L   Calcium, Ion 1.10 (L) 1.15 - 1.40 mmol/L   HCT 37.0 36.0 - 46.0 %   Hemoglobin 12.6 12.0 - 15.0 g/dL   Patient temperature 101.8 F    Collection site RADIAL, ALLEN'S TEST ACCEPTABLE    Drawn by RT    Sample type ARTERIAL    Comment NOTIFIED PHYSICIAN   Glucose, capillary     Status: Abnormal   Collection Time: 08/23/2022 11:56 PM  Result Value Ref Range   Glucose-Capillary 55 (L) 70 - 99 mg/dL    Comment: Glucose reference range applies only to samples taken after fasting for at least 8 hours.  CBC     Status: Abnormal   Collection Time: 08/06/2022 11:57 PM  Result Value Ref Range   WBC 38.5 (H) 4.0 - 10.5 K/uL   RBC 4.14 3.87 - 5.11 MIL/uL    Hemoglobin 13.3 12.0 - 15.0 g/dL   HCT 39.5 36.0 - 46.0 %   MCV 95.4 80.0 - 100.0 fL   MCH 32.1 26.0 - 34.0 pg   MCHC 33.7 30.0 - 36.0 g/dL   RDW 13.6 11.5 - 15.5 %   Platelets 62 (L) 150 - 400 K/uL    Comment: Immature Platelet Fraction may be clinically indicated, consider ordering this additional test [id number] REPEATED TO VERIFY PLATELET COUNT CONFIRMED BY SMEAR    nRBC 0.0 0.0 - 0.2 %    Comment: Performed at [facility], [place] [address] [place], [place] [address]  Creatinine, serum     Status: Abnormal   Collection Time: 08/10/2022 11:57 PM  Result Value Ref Range   Creatinine, Ser 2.71 (H) 0.44 - 1.00 mg/dL   GFR, Estimated 19 (L) >60 mL/min    Comment: (NOTE) Calculated using the CKD-EPI Creatinine Equation (2021) Performed at [facility] [address], [place], [place] [address]   HIV Antibody (routine testing w rflx)     Status: None   Collection Time: 08/01/2022 11:57 PM  Result Value Ref Range   HIV Screen 4th Generation wRfx Non Reactive Non Reactive    Comment: Performed at [facility], [place] [address], [place], [place] [address]  Lactic acid, plasma     Status: Abnormal   Collection Time: 08/07/2022 11:57 PM  Result Value Ref Range   Lactic Acid, Venous 6.1 (HH) 0.5 - 1.9 mmol/L    Comment: CRITICAL RESULT CALLED TO, READ BACK BY AND VERIFIED WITH MINDY ELIAS RN 08/06/22 0035 Wiliam Ke Performed at [facility], [place] [address], [place], [place] [address]   Urinalysis, Routine w reflex microscopic     Status: Abnormal   Collection Time: 08/06/22 12:20 AM  Result Value Ref Range   Color, Urine AMBER (A) YELLOW    Comment: BIOCHEMICALS MAY BE AFFECTED BY COLOR   APPearance CLOUDY (A) CLEAR   Specific Gravity, Urine 1.016 1.005 - 1.030   pH 5.0 5.0 - 8.0   Glucose, UA NEGATIVE NEGATIVE mg/dL   Hgb urine dipstick LARGE (A) NEGATIVE   Bilirubin Urine NEGATIVE NEGATIVE   Ketones, ur NEGATIVE NEGATIVE mg/dL  Protein, ur 100 (A) NEGATIVE  mg/dL   Nitrite NEGATIVE NEGATIVE   Leukocytes,Ua NEGATIVE NEGATIVE   RBC / HPF 0-5 0 - 5 RBC/hpf   WBC, UA 11-20 0 - 5 WBC/hpf   Bacteria, UA MANY (A) NONE SEEN   Squamous Epithelial / LPF 0-5 0 - 5   Mucus PRESENT    Hyaline Casts, UA PRESENT    Granular Casts, UA PRESENT     Comment: Performed at [facility] [address], [place], [place] [address]  MRSA Next Gen by PCR, Nasal     Status: None   Collection Time: 08/06/22 12:25 AM   Specimen: Nasal Mucosa; Nasal Swab  Result Value Ref Range   MRSA by PCR Next Gen NOT DETECTED NOT DETECTED    Comment: (NOTE) The GeneXpert MRSA Assay (FDA approved for NASAL specimens only), is one component of a comprehensive MRSA colonization surveillance program. It is not intended to diagnose MRSA infection nor to guide or monitor treatment for MRSA infections. Test performance is not FDA approved in patients less than 29 years old. Performed at [facility], [place] [address], [place], [place] [address]   Glucose, capillary     Status: Abnormal   Collection Time: 08/06/22 12:32 AM  Result Value Ref Range   Glucose-Capillary 110 (H) 70 - 99 mg/dL    Comment: Glucose reference range applies only to samples taken after fasting for at least 8 hours.  Glucose, capillary     Status: Abnormal   Collection Time: 08/06/22  1:24 AM  Result Value Ref Range   Glucose-Capillary 105 (H) 70 - 99 mg/dL    Comment: Glucose reference range applies only to samples taken after fasting for at least 8 hours.  C Difficile Quick Screen w PCR reflex     Status: None   Collection Time: 08/06/22  2:00 AM   Specimen: STOOL  Result Value Ref Range   C Diff antigen NEGATIVE NEGATIVE   C Diff toxin NEGATIVE NEGATIVE   C Diff interpretation No C. difficile detected.     Comment: Performed at [facility], [place] [address] [place], [place] [address]  Protime-INR     Status: Abnormal   Collection Time: 08/06/22  4:55 AM  Result Value Ref Range    Prothrombin Time 31.3 (H) 11.4 - 15.2 seconds   INR 3.1 (H) 0.8 - 1.2    Comment: (NOTE) INR goal varies based on device and disease states. Performed at [facility], [place] [address], [place], [place] [address]   Procalcitonin     Status: None   Collection Time: 08/06/22  4:55 AM  Result Value Ref Range   Procalcitonin >150.00 ng/mL    Comment:        Interpretation: PCT >= 10 ng/mL: Important systemic inflammatory response, almost exclusively due to severe bacterial sepsis or septic shock. (NOTE)       Sepsis PCT Algorithm           Lower Respiratory Tract                                      Infection PCT Algorithm    ----------------------------     ----------------------------         PCT < 0.25 ng/mL                PCT < 0.10 ng/mL  Strongly encourage             Strongly discourage   discontinuation of antibiotics    initiation of antibiotics    ----------------------------     -----------------------------       PCT 0.25 - 0.50 ng/mL            PCT 0.10 - 0.25 ng/mL               OR       >80% decrease in PCT            Discourage initiation of                                            antibiotics      Encourage discontinuation           of antibiotics    ----------------------------     -----------------------------         PCT >= 0.50 ng/mL              PCT 0.26 - 0.50 ng/mL                AND       <80% decrease in PCT             Encourage initiation of                                             antibiotics       Encourage continuation           of antibiotics    ----------------------------     -----------------------------        PCT >= 0.50 ng/mL                  PCT > 0.50 ng/mL               AND         increase in PCT                  Strongly encourage                                      initiation of antibiotics    Strongly encourage escalation           of antibiotics                                     -----------------------------                                            PCT <= 0.25 ng/mL                                                 OR                                        >  80% decrease in PCT                                      Discontinue / Do not initiate                                             antibiotics  Performed at [facility], [place] [address] [place], [place] [address]   Comprehensive metabolic panel     Status: Abnormal   Collection Time: 08/06/22  4:55 AM  Result Value Ref Range   Sodium 137 135 - 145 mmol/L   Potassium 2.9 (L) 3.5 - 5.1 mmol/L   Chloride 106 98 - 111 mmol/L   CO2 18 (L) 22 - 32 mmol/L   Glucose, Bld 142 (H) 70 - 99 mg/dL    Comment: Glucose reference range applies only to samples taken after fasting for at least 8 hours.   BUN 41 (H) 8 - 23 mg/dL   Creatinine, Ser 3.10 (H) 0.44 - 1.00 mg/dL   Calcium 7.7 (L) 8.9 - 10.3 mg/dL   Total Protein 4.5 (L) 6.5 - 8.1 g/dL   Albumin 2.5 (L) 3.5 - 5.0 g/dL   AST 497 (H) 15 - 41 U/L   ALT 445 (H) 0 - 44 U/L   Alkaline Phosphatase 88 38 - 126 U/L   Total Bilirubin 3.5 (H) 0.3 - 1.2 mg/dL   GFR, Estimated 16 (L) >60 mL/min    Comment: (NOTE) Calculated using the CKD-EPI Creatinine Equation (2021)    Anion gap 13 5 - 15    Comment: Performed at [facility], [place] [address] [place], [place] [address]  CBC     Status: Abnormal   Collection Time: 08/06/22  4:55 AM  Result Value Ref Range   WBC 49.1 (H) 4.0 - 10.5 K/uL   RBC 4.12 3.87 - 5.11 MIL/uL   Hemoglobin 13.2 12.0 - 15.0 g/dL   HCT 38.6 36.0 - 46.0 %   MCV 93.7 80.0 - 100.0 fL   MCH 32.0 26.0 - 34.0 pg   MCHC 34.2 30.0 - 36.0 g/dL   RDW 13.6 11.5 - 15.5 %   Platelets 37 (L) 150 - 400 K/uL    Comment: Immature Platelet Fraction may be clinically indicated, consider ordering this additional test [id number] REPEATED TO VERIFY PLATELET COUNT CONFIRMED BY SMEAR    nRBC 0.0 0.0 - 0.2 %    Comment: Performed at [facility], [place] [address],  [place], [place] [address]  Differential     Status: Abnormal   Collection Time: 08/06/22  4:55 AM  Result Value Ref Range   Neutrophils Relative % 90 %   Neutro Abs 44.1 (H) 1.7 - 7.7 K/uL   Lymphocytes Relative 2 %   Lymphs Abs 0.8 0.7 - 4.0 K/uL   Monocytes Relative 3 %   Monocytes Absolute 1.5 (H) 0.1 - 1.0 K/uL   Eosinophils Relative 0 %   Eosinophils Absolute 0.0 0.0 - 0.5 K/uL   Basophils Relative 0 %   Basophils Absolute 0.0 0.0 - 0.1 K/uL   WBC Morphology      MODERATE LEFT SHIFT (>5% METAS AND MYELOS,OCC PRO NOTED)   RBC Morphology MORPHOLOGY UNREMARKABLE    Smear Review MORPHOLOGY UNREMARKABLE  Immature Granulocytes 5 %   Abs Immature Granulocytes 2.51 (H) 0.00 - 0.07 K/uL    Comment: Performed at [facility], [place] [address], [place], [place] [address]  Lactic acid, plasma     Status: Abnormal   Collection Time: 08/06/22  4:56 AM  Result Value Ref Range   Lactic Acid, Venous 4.7 (HH) 0.5 - 1.9 mmol/L    Comment: CRITICAL VALUE NOTED. VALUE IS CONSISTENT WITH PREVIOUSLY REPORTED/CALLED VALUE Performed at [facility], [place] [address], [place], [place] [address]   Glucose, capillary     Status: Abnormal   Collection Time: 08/06/22  4:58 AM  Result Value Ref Range   Glucose-Capillary 132 (H) 70 - 99 mg/dL    Comment: Glucose reference range applies only to samples taken after fasting for at least 8 hours.  I-STAT 7, (LYTES, BLD GAS, ICA, H+H)     Status: Abnormal   Collection Time: 08/06/22  5:46 AM  Result Value Ref Range   pH, Arterial 7.230 (L) 7.35 - 7.45   pCO2 arterial 43.3 32 - 48 mmHg   pO2, Arterial 147 (H) 83 - 108 mmHg   Bicarbonate 18.1 (L) 20.0 - 28.0 mmol/L   TCO2 19 (L) 22 - 32 mmol/L   O2 Saturation 99 %   Acid-base deficit 9.0 (H) 0.0 - 2.0 mmol/L   Sodium 138 135 - 145 mmol/L   Potassium 3.1 (L) 3.5 - 5.1 mmol/L   Calcium, Ion 1.13 (L) 1.15 - 1.40 mmol/L   HCT 39.0 36.0 - 46.0 %   Hemoglobin 13.3 12.0 - 15.0 g/dL   Sample type ARTERIAL    Glucose, capillary     Status: Abnormal   Collection Time: 08/06/22  8:01 AM  Result Value Ref Range   Glucose-Capillary 56 (L) 70 - 99 mg/dL    Comment: Glucose reference range applies only to samples taken after fasting for at least 8 hours.  Potassium     Status: None   Collection Time: 08/06/22  9:50 AM  Result Value Ref Range   Potassium 4.0 3.5 - 5.1 mmol/L    Comment: Performed at [facility], [place] [address] [place], [place] [address]  DIC Panel ONCE - STAT     Status: Abnormal   Collection Time: 08/06/22  9:53 AM  Result Value Ref Range   Prothrombin Time 35.2 (H) 11.4 - 15.2 seconds   INR 3.5 (H) 0.8 - 1.2    Comment: (NOTE) INR goal varies based on device and disease states.    aPTT 71 (H) 24 - 36 seconds    Comment:        IF BASELINE aPTT IS ELEVATED, SUGGEST PATIENT RISK ASSESSMENT BE USED TO DETERMINE APPROPRIATE ANTICOAGULANT THERAPY.    Fibrinogen 223 210 - 475 mg/dL    Comment: (NOTE) Fibrinogen results may be underestimated in patients receiving thrombolytic therapy.    D-Dimer, Quant >20.00 (H) 0.00 - 0.50 ug/mL-FEU    Comment: (NOTE) At the manufacturer cut-off value of 0.5 g/mL FEU, this assay has a negative predictive value of 95-100%.This assay is intended for use in conjunction with a clinical pretest probability (PTP) assessment model to exclude pulmonary embolism (PE) and deep venous thrombosis (DVT) in outpatients suspected of PE or DVT. Results should be correlated with clinical presentation.    Platelets 33 (L) 150 - 400 K/uL    Comment: Immature Platelet Fraction may be clinically indicated, consider ordering this additional test [id number] REPEATED TO VERIFY  Smear Review NO SCHISTOCYTES SEEN     Comment: Performed at [facility], [place] [address], [place], [place] [address]  Hemoglobin A1c     Status: None   Collection Time: 08/06/22  9:53 AM  Result Value Ref Range   Hgb A1c MFr Bld 5.5 4.8 - 5.6 %    Comment:  (NOTE) Pre diabetes:          5.7%-6.4%  Diabetes:              >6.4%  Glycemic control for   <7.0% adults with diabetes    Mean Plasma Glucose 111.15 mg/dL    Comment: Performed at [place] [address] [place], [place] [address]  Triglycerides     Status: Abnormal   Collection Time: 08/06/22  9:54 AM  Result Value Ref Range   Triglycerides 178 (H) <150 mg/dL    Comment: Performed at [facility] [address], [place], [place] [address]  Glucose, capillary     Status: None   Collection Time: 08/06/22 11:45 AM  Result Value Ref Range   Glucose-Capillary 97 70 - 99 mg/dL    Comment: Glucose reference range applies only to samples taken after fasting for at least 8 hours.  Ferritin     Status: None   Collection Time: 08/06/22 12:40 PM  Result Value Ref Range   Ferritin 157 11 - 307 ng/mL    Comment: Performed at [facility], [place] [address], [place], [place] [address]  Lactic acid, plasma     Status: Abnormal   Collection Time: 08/06/22 12:42 PM  Result Value Ref Range   Lactic Acid, Venous 4.7 (HH) 0.5 - 1.9 mmol/L    Comment: CRITICAL VALUE NOTED. VALUE IS CONSISTENT WITH PREVIOUSLY REPORTED/CALLED VALUE Performed at [facility], [place] [address] [place], [place] [address]   I-STAT 7, (LYTES, BLD GAS, ICA, H+H)     Status: Abnormal   Collection Time: 08/06/22  1:44 PM  Result Value Ref Range   pH, Arterial 7.356 7.35 - 7.45   pCO2 arterial 35.7 32 - 48 mmHg   pO2, Arterial 147 (H) 83 - 108 mmHg   Bicarbonate 20.1 20.0 - 28.0 mmol/L   TCO2 21 (L) 22 - 32 mmol/L   O2 Saturation 99 %   Acid-base deficit 5.0 (H) 0.0 - 2.0 mmol/L   Sodium 134 (L) 135 - 145 mmol/L   Potassium 3.9 3.5 - 5.1 mmol/L   Calcium, Ion 0.96 (L) 1.15 - 1.40 mmol/L   HCT 36.0 36.0 - 46.0 %   Hemoglobin 12.2 12.0 - 15.0 g/dL   Patient temperature 97.9 F    Collection site [employer] by HIDE    Sample type ARTERIAL     MICRO:  IMAGING: DG Abd 1  View  Result Date: 08/06/2022 CLINICAL DATA:  71 year old female intubated, enteric tube placement. EXAM: ABDOMEN - 1 VIEW COMPARISON:  Portable chest 0552 hours today. FINDINGS: Portable AP semi upright view at 0553 hours. Enteric tube looped in the stomach which is gas distended. Increased gas-filled proximal small bowel loops also when compared to the CT Chest, Abdomen, and Pelvis yesterday. Patchy and confluent retrocardiac opacity at the left lung base. IMPRESSION: 1. Satisfactory enteric tube placement into the stomach which is gas distended along with some proximal small bowel. Consider NG tube to suction. 2. Left lower lobe collapse or consolidation. Electronically Signed   By: Lemmie Evens  [place] Crane M.D.   On: 08/06/2022 06:41   DG Chest 1 View  Result Date: 08/06/2022 CLINICAL DATA:  71 year old female intubated, enteric tube placement. EXAM: CHEST  1 VIEW COMPARISON:  0105 hours portable chest today. FINDINGS: Portable AP semi upright view at 0552 hours. Trachea is located to the right of midline as demonstrated by chest CT yesterday. Endotracheal tube does project over the tracheal air column and tip is above the carina. Enteric tube courses to the abdomen and is looped in the left upper quadrant. There is moderate to severe gas distension of the stomach. Left subclavian central line is stable. Stable somewhat low lung volumes. Increased retrocardiac opacity which now resembles consolidation, largely new from the chest CT yesterday. No pneumothorax or pulmonary edema. No definite pleural effusion. No acute osseous abnormality identified. IMPRESSION: 1. Satisfactory ET tube and enteric tube placement. Gas distended stomach, consider NG tube to suction. 2. Increasing left lower lobe consolidation. Electronically Signed   By: Genevie Ann M.D.   On: 08/06/2022 06:40   DG CHEST PORT 1 VIEW  Result Date: 08/06/2022 CLINICAL DATA:  [id number] Encounter for central line placement [id number] EXAM: PORTABLE CHEST 1 VIEW  COMPARISON:  CT chest 07/30/2022. chest x-ray 08/15/2022 FINDINGS: Interval placement of a left subclavian access central venous catheter with tip overlying the expected region the superior cavoatrial junction. The heart and mediastinal contours are unremarkable given AP portable technique. Retrocardiac airspace opacity likely atelectasis. Mildly increased interstitial markings. No pleural effusion. No pneumothorax. No acute osseous abnormality. IMPRESSION: 1. Mild pulmonary edema. 2. Retrocardiac airspace opacity likely atelectasis. 3. Interval placement of a left subclavian venous catheter with tip overlying the expected region the superior cavoatrial junction. Electronically Signed   By: Iven Finn M.D.   On: 08/06/2022 01:47   CT CHEST ABDOMEN PELVIS WO CONTRAST  Result Date: 08/17/2022 CLINICAL DATA:  Sepsis EXAM: CT CHEST, ABDOMEN AND PELVIS WITHOUT CONTRAST TECHNIQUE: Multidetector CT imaging of the chest, abdomen and pelvis was performed following the standard protocol without IV contrast. RADIATION DOSE REDUCTION: This exam was performed according to the departmental dose-optimization program which includes automated exposure control, adjustment of the mA and/or kV according to patient size and/or use of iterative reconstruction technique. COMPARISON:  CT 12/31/2019 FINDINGS: CT CHEST FINDINGS Cardiovascular: Limited evaluation without intravenous contrast. Mild atherosclerosis. No aneurysm. Normal cardiac size. Trace pericardial effusion Mediastinum/Nodes: Midline trachea. No thyroid mass. No suspicious lymph nodes. Esophagus within normal limits. Lungs/Pleura: No consolidation or pleural effusion. Mild dependent atelectasis. Musculoskeletal: Sternum is intact.  No acute osseous abnormality. CT ABDOMEN PELVIS FINDINGS Hepatobiliary: No focal liver abnormality is seen. No gallstones, gallbladder wall thickening, or biliary dilatation. Pancreas: Unremarkable. No pancreatic ductal dilatation or  surrounding inflammatory changes. Spleen: Splenectomy Adrenals/Urinary Tract: Adrenal glands are within normal limits. Hazy bilateral perinephric fat stranding and fat stranding at the renal pelvises left greater than right. No hydronephrosis. No obstructing stone. The bladder is unremarkable Stomach/Bowel: The stomach is nonenlarged. No dilated small bowel. Collapsed appearance of the colon, no inflammation. Mild diverticular disease of the left colon. Appendix contains small stone but is otherwise normal in appearance. Vascular/Lymphatic: Mild atherosclerosis. No aneurysm. No suspicious lymph nodes Reproductive: Uterus and bilateral adnexa are unremarkable. Other: Negative for pelvic effusion or free air. Periumbilical ventral hernia now containing fat and loop of small bowel but no obstruction or incarceration. Musculoskeletal: No acute osseous abnormality. IMPRESSION: 1. No CT evidence for acute intrathoracic abnormality. 2. Hazy bilateral perinephric fat stranding and fat stranding at  the renal pelvises left greater than right, findings are suggestive upper urinary tract infection/pyelonephritis. No hydronephrosis or obstructing stone. Correlate with urinalysis 3. Periumbilical ventral hernia now containing fat and loop of small bowel but no obstruction or incarceration. 4. Mild diverticular disease of the left colon without acute inflammatory process. Electronically Signed   By: Donavan Foil M.D.   On: 08/13/2022 20:00   CT Head Wo Contrast  Result Date: 08/19/2022 CLINICAL DATA:  Delirium. EXAM: CT HEAD WITHOUT CONTRAST TECHNIQUE: Contiguous axial images were obtained from the base of the skull through the vertex without intravenous contrast. RADIATION DOSE REDUCTION: This exam was performed according to the departmental dose-optimization program which includes automated exposure control, adjustment of the mA and/or kV according to patient size and/or use of iterative reconstruction technique.  COMPARISON:  None Available. FINDINGS: Brain: The ventricles and sulci are appropriate size for the patient's age. The gray-white matter discrimination is preserved. There is no acute intracranial hemorrhage. No mass effect or midline shift. No extra-axial fluid collection. Vascular: No hyperdense vessel or unexpected calcification. Skull: Normal. Negative for fracture or focal lesion. Sinuses/Orbits: No acute finding. Other: None IMPRESSION: No acute intracranial pathology. Electronically Signed   By: Anner Crete M.D.   On: 08/10/2022 19:53   DG Chest Port 1 View  Result Date: 07/28/2022 CLINICAL DATA:  Possible sepsis EXAM: PORTABLE CHEST 1 VIEW COMPARISON:  10/09/2018 FINDINGS: The heart size and mediastinal contours are within normal limits. Both lungs are clear. The visualized skeletal structures are unremarkable. Minimal atelectasis at the lingula and left base. IMPRESSION: No active disease. Minimal atelectasis at the lingula and left base. Electronically Signed   By: Donavan Foil M.D.   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 Overall condition still guarded since on 2 pressors. Lactic acid some improvement but still not WNL. Concern for DIC. Identification of GNR is still pending - recommend to change piptazo to meropenem for the time being - will continue to follow Identification from micro lab - severe sepsis = still requiring escalation of care with pressor support, will continue to monitor for digital necrosis.

## 2022-08-06 NOTE — Progress Notes (Addendum)
[facility] Progress Note Patient Name: Michelle Cohen DOB: 1951/05/23 MRN: [id number]   Date of Service  08/06/2022  HPI/Events of Note  Lactic acid 4.7, K+ 3.1, GFR 16.  eICU Interventions  Albumin 5 % 250 ml iv bolus  x 1, KCL 10 meq iv Q 1 hour x 2. K+ and lactic acid check at 11 am.        Frederik Pear 08/06/2022, 6:34 AM

## 2022-08-06 NOTE — Progress Notes (Signed)
eLink Physician-Brief Progress Note Patient Name: Michelle Cohen DOB: Mar 22, 1951 MRN: [id number]   Date of Service  08/06/2022  HPI/Events of Note  Blood pressure soft.  eICU Interventions  Bicarb 50 meq iv x 1, Ceiling increased on Norepinephrine gtt to 50 mcg.        [name] 08/06/2022, 7:02 AM

## 2022-08-06 NOTE — Progress Notes (Addendum)
[facility] Progress Note Patient Name: Michelle Cohen DOB: Sep 25, 1951 MRN: [id number]   Date of Service  08/06/2022  HPI/Events of Note  Patient on maximum dose Norepinephrine gtt and 0.03 mcg of Vasopressin gtt with blood pressure of 83/52.  eICU Interventions  Albumin 5 % 250 ml iv x 1 ordered and Vasopressin gtt increased to 0.04 mcg. ABG ordered to exclude metabolic acidosis contributing to the hypotension.        Rudra Hobbins U Ariely Riddell 08/06/2022, 2:10 AM

## 2022-08-06 NOTE — IPAL (Signed)
  Interdisciplinary Goals of Care Family Meeting   Date carried out:: 08/06/2022  Location of the meeting: Bedside  Member's involved: Nurse Practitioner, Bedside Registered Nurse, and Family Member or next of kin  Durable Power of Attorney or acting medical decision maker: Patient and Lamar Sprinkles (significant other)  Discussion: We discussed goals of care for Michelle Cohen .  Michelle Cohen and Michelle Cohen have talked- she does not want chest compressions.  When asked about mechanical ventilation, Michelle Cohen is unsure of her wishes. She stated "I'm not ready to make that decision." I explained that I always like to have these conversations of the patient can discuss with me. She stated that if she became altered to the point that she could not make decisions, that Michelle Cohen would make the decision for her.  Stated that I would change code status to reflect no cpr, but to do all other interventions otherwise. Michelle Cohen agreed.  Code status: Limited Code. No CPR, all other interventions OK  Disposition: Continue current acute care   Time spent for the meeting: 10 minutes  Estill Cotta 08/06/2022, 3:08 AM

## 2022-08-06 NOTE — Progress Notes (Signed)
   NAME:  Michelle Cohen, MRN:  [id number], DOB:  1951-04-01, LOS: 1 ADMISSION DATE:  08/04/2022, CONSULTATION DATE:  11/10 REFERRING MD:  Pearline Cables, CHIEF COMPLAINT:  nausea, vomiting, malase   History of Present Illness:   Michelle Cohen, is a 71 y.o. 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 No melena or BRBPR. Generalized weakness. Denies sick contacts and recent abx. Endorses nonproductive cough, dyspnea, rigors, subjective fever.  At [facility] she was found to be hypotensive (BP 57/47), tachycardic, WBC 20.7, lactate greater than 9, trop 157, creat 2.47. CT chest/abd/pel WO concerning for possible right pyelo. Code sepsis initiated. 2L LR ordered. Patient started on levophed. BC ordered. Vanc/Cefe/Flagyl ordered. She was started on BiPAP for WOB. ED to ED transfer initiated. Marland Kitchen  PCCM was consulted for admission   Pertinent  Medical History  B cell lymphoma and splenectomy   Significant Hospital Events: Including procedures, antibiotic start and stop dates in addition to other pertinent events   11/10 Presented to [facility], Cefepime, Vanc, flagyl>, BC>, Head ct Neg, CT chest/abd/pel wo- ?pyelo   Interim History / Subjective:  Ongoing profound shock and multiorgan dysfunction.  Objective   Blood pressure (!) 87/55, pulse (!) 110, temperature 99.2 F (37.3 C), temperature source Oral, resp. rate (!) 32, height '5\' 5"'$  (1.651 m), weight 74.4 kg, SpO2 98 %.    Vent Mode: PRVC FiO2 (%):  [40 %-100 %] 100 % Set Rate:  [16 bmp-32 bmp] 32 bmp Vt Set:  [340 mL] 340 mL PEEP:  [5 cmH20] 5 cmH20 Plateau Pressure:  [15 cmH20] 15 cmH20   Intake/Output Summary (Last 24 hours) at 08/06/2022 0735 Last data filed at 08/06/2022 6712 Gross per 24 hour  Intake 6205.8 ml  Output 170 ml  Net 6035.8 ml    Filed  Weights   08/06/22 0400  Weight: 74.4 kg    Examination: Ill appearing woman awake on vent Moves all 4 ext purposefully Lungs diminished L base Abd soft Ext mottled Tongue dry  Acidemic Lactate better WBC up Plts dropping Sugars ok  Resolved Hospital Problem list     Assessment & Plan:  Overwhelming bacterial sepsis with multiorgan dysfunction (kidney, liver, skin, heart, bone marrow)- question pyelo but urine is equivocal.  GI symptoms preceding. B cell lymphoma on rituxan s/p splenectomy  - Pressors to MAP 65 - Methylene blue - IVIG - Broad spectrum abx - Bicarb push and drip, low threshold for CRRT - Stress steroids - f/u culture data - Trend labs - Check DIC panel, ferritin, TG - Hopefully can push through next 24h - Sedatation for RASS -1 - Wife/partner updated at bedside  Best Practice (right click and "Reselect all SmartList Selections" daily)   Diet/type: LIS for today DVT prophylaxis: prophylactic heparin  GI prophylaxis: PPI Lines: N/A Foley:  Yes, and it is still needed Code Status:  full code Last date of multidisciplinary goals of care discussion [11/10 see IPAL note]  45 min cc time Erskine Emery MD PCCM

## 2022-08-06 NOTE — Procedures (Signed)
Central Venous Catheter Insertion Procedure Note  Elyzabeth Goatley  [id number]  1950/10/24  Date:08/06/22  Time:3:02 AM   Provider Performing:Kailiana Granquist   Procedure: Insertion of Non-tunneled Central Venous [phone]) with US guidance ([id number])   Indication(s) Medication administration  Consent Risks of the procedure as well as the alternatives and risks of each were explained to the patient and/or caregiver.  Consent for the procedure was obtained and is signed in the bedside chart  Anesthesia Topical only with 1% lidocaine   Timeout Verified patient identification, verified procedure, site/side was marked, verified correct patient position, special equipment/implants available, medications/allergies/relevant history reviewed, required imaging and test results available.  Sterile Technique Maximal sterile technique including full sterile barrier drape, hand hygiene, sterile gown, sterile gloves, mask, hair covering, sterile ultrasound probe cover (if used).  Procedure Description Area of catheter insertion was cleaned with chlorhexidine and draped in sterile fashion.  With real-time ultrasound guidance a central venous catheter was placed into the left subclavian vein. Nonpulsatile blood flow and easy flushing noted in all ports.  The catheter was sutured in place and sterile dressing applied.  Complications/Tolerance None; patient tolerated the procedure well. Chest X-ray is ordered to verify placement for internal jugular or subclavian cannulation.   Chest x-ray is not ordered for femoral cannulation.  EBL Minimal  Specimen(s) None

## 2022-08-06 NOTE — Progress Notes (Signed)
[facility] Progress Note Patient Name: Michelle Cohen DOB: 1951/07/16 MRN: [id number]   Date of Service  08/06/2022  HPI/Events of Note  Patient admitted with enteritis with copious diarrhea.  eICU Interventions  C-Diff screen and precautions ordered.        Kerry Kass Konner Warrior 08/06/2022, 2:32 AM

## 2022-08-06 NOTE — Procedures (Signed)
Arterial Catheter Insertion Procedure Note  Sallyanne Birkhead  [id number]  1951/04/26  Date:08/06/22  Time:4:14 AM    Provider Performing: Jacky Kindle    Procedure: Insertion of Arterial Line [phone]) with US guidance ([id number])   Indication(s) Blood pressure monitoring and/or need for frequent ABGs  Consent Risks of the procedure as well as the alternatives and risks of each were explained to the patient and/or caregiver.  Consent for the procedure was obtained and is signed in the bedside chart  Anesthesia None   Time Out Verified patient identification, verified procedure, site/side was marked, verified correct patient position, special equipment/implants available, medications/allergies/relevant history reviewed, required imaging and test results available.   Sterile Technique Maximal sterile technique including full sterile barrier drape, hand hygiene, sterile gown, sterile gloves, mask, hair covering, sterile ultrasound probe cover (if used).   Procedure Description Area of catheter insertion was cleaned with chlorhexidine and draped in sterile fashion. With real-time ultrasound guidance an arterial catheter was placed into the right  Axillary  artery.  Appropriate arterial tracings confirmed on monitor.     Complications/Tolerance None; patient tolerated the procedure well.   EBL Minimal   Specimen(s) None

## 2022-08-06 NOTE — Progress Notes (Signed)
LA 5.8, Troponin >24,000, notified eLink.   Lesle Reek, RN

## 2022-08-06 NOTE — Progress Notes (Signed)
Care Contact Phone Call  [place] L[place]'s significant other, Lamar Sprinkles, at [phone]. They were updated on Eri Nunziato's clinical status and plan.  Notified of troponin and concern for cardiac injury.  Also notified of cards conversation with Dr. Loanne Drilling regarding no interventions from a cardiology standpoint.  Patient having decreasing pressor requirements over the past 24 hours.  Cinthya planning on staying on the resting.  Nursing to update Caren Griffins if pressor requirements increase or patient's vital signs are, stable.  Communicated to [employer].  Patient remains no CPR.  We will continue support with vasopressors.  Redmond School., MSN, APRN, AGACNP-BC [facility]  08/06/2022 , 9:11 PM  Please see [email or web address] for pager details  If no response, please call [phone] After hours, please call Elink at [phone]

## 2022-08-06 NOTE — Progress Notes (Addendum)
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 Admitted for septic shock 2/2 GNR bacteremia 2/2 possible pyelo. Possible DIC. Broadened to meropenem by ID today. S/p methylene blue.  Elink called for abnormal EKG concerning for STEMI in I and aVL  eICU Interventions  Discussed care with Cardiology on-call. Not a candidate for cath due to hemodynamic instability and not a candidate for antiplatelet or anticoagulation d/t severe thrombocytopenia (Plt 15) -Trend trop, LA -Trend EKG -Team to call cardiology for formal consult in am in indicated   9:07 PM Initial trop 24K. No change to above plan. Ground team will contact patient's wife for update. 9:22 PM LA 5.8. IVF bolus   Intervention Category Intermediate Interventions: Arrhythmia - evaluation and management  [name] 08/06/2022, 8:23 PM

## 2022-08-06 NOTE — Progress Notes (Signed)
[place] Progress Note Patient Name: Michelle Cohen DOB: 07/05/1951 MRN: [id number]   Date of Service  08/06/2022  HPI/Events of Note  RT x 2 have tried unsuccessfully to place arterial line, patient is in septic shock on multiple pressors.  eICU Interventions  PCCM Ground Crew requested to come and place the line.        Kerry Kass [name] 08/06/2022, 2:40 AM

## 2022-08-06 NOTE — Progress Notes (Signed)
Arterial line was attempted by Rtx2 and was unable to be placed. RN notified RT will continue to monitor.

## 2022-08-06 NOTE — Progress Notes (Signed)
Pt with GNR bacteremia. Ok to [employer] per Dr. Baxter Flattery.  Onnie Boer, PharmD, BCIDP, AAHIVP, CPP Infectious Disease Pharmacist 08/06/2022 9:10 PM

## 2022-08-06 NOTE — Progress Notes (Incomplete)
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 Admitted for septic shock 2/2 GNR bacteremia 2/2 possible pyelo. Possible DIC. Broadened to meropenem by ID today. S/p methylene blue.  Elink called for abnormal EKG concerning for STEMI in I and aVL  eICU Interventions  Discussed care with Cardiology on-call. Not a candidate for cath due to hemodynamic instability and not a candidate for antiplatelet or anticoagulation d/t severe thrombocytopenia (Plt 15) -Trend trop, LA -Trend EKG -Team to call cardiology for formal consult in am in indicated   9:07 PM Initial trop 24K. No change to above plan. Ground team will contact patient's wife for update. 9:22 PM LA 5.8. IVF bolus   Intervention Category Intermediate Interventions: Arrhythmia - evaluation and management  Dyonna Jaspers Rodman Pickle 08/06/2022, 8:23 PM

## 2022-08-06 NOTE — Progress Notes (Signed)
[place] Progress Note Patient Name: Adelle Zachar DOB: [birth year] MRN: [id number]   Date of Service  08/06/2022  HPI/Events of Note  CXR reviewed, central line in the distal SVC, no pneumothorax.  eICU Interventions  Okay to use central line.        Kerry Kass Nazanin Kinner 08/06/2022, 1:45 AM

## 2022-08-06 NOTE — Progress Notes (Signed)
Patient dyssynchronous with ventilator and monitor alarming ST elevation. EKG obtained. Critical result called to [place]. Orders received. See MAR for med admin.   Lesle Reek, RN

## 2022-08-06 NOTE — Progress Notes (Signed)
[place] Progress Note Patient Name: Michelle Cohen DOB: [birth year] MRN: [id number]   Date of Service  08/06/2022  HPI/Events of Note  ABG reviewed.  eICU Interventions  Ventilator changes  made, ABG at 7 am.        Frederik Pear 08/06/2022, 6:14 AM

## 2022-08-06 NOTE — Progress Notes (Signed)
PHARMACY - PHYSICIAN COMMUNICATION CRITICAL VALUE ALERT - BLOOD CULTURE IDENTIFICATION (BCID)  Michelle Cohen is an 71 y.o. female who presented to [facility] on 08/25/2022 with a chief complaint of nausea, vomiting, malaise    Assessment:  2 out of 4 blood cultures positive for GNRs (both are anaerobic bottles)  Source unknown at this time. BCID unable to identify.  Name of physician (or Provider) Contacted: Dr. Erskine Emery  Current antibiotics: Vanc and Zosyn  Changes to prescribed antibiotics recommended:  Patient is on recommended antibiotics - No changes needed   Results for orders placed or performed during the hospital encounter of 08/03/2022  Blood Culture ID Panel (Reflexed) (Collected: 08/21/2022  4:58 PM)  Result Value Ref Range   Enterococcus faecalis NOT DETECTED NOT DETECTED   Enterococcus Faecium NOT DETECTED NOT DETECTED   Listeria monocytogenes NOT DETECTED NOT DETECTED   Staphylococcus species NOT DETECTED NOT DETECTED   Staphylococcus aureus (BCID) NOT DETECTED NOT DETECTED   Staphylococcus epidermidis NOT DETECTED NOT DETECTED   Staphylococcus lugdunensis NOT DETECTED NOT DETECTED   Streptococcus species NOT DETECTED NOT DETECTED   Streptococcus agalactiae NOT DETECTED NOT DETECTED   Streptococcus pneumoniae NOT DETECTED NOT DETECTED   Streptococcus pyogenes NOT DETECTED NOT DETECTED   A.calcoaceticus-baumannii NOT DETECTED NOT DETECTED   Bacteroides fragilis NOT DETECTED NOT DETECTED   Enterobacterales NOT DETECTED NOT DETECTED   Enterobacter cloacae complex NOT DETECTED NOT DETECTED   Escherichia coli NOT DETECTED NOT DETECTED   Klebsiella aerogenes NOT DETECTED NOT DETECTED   Klebsiella oxytoca NOT DETECTED NOT DETECTED   Klebsiella pneumoniae NOT DETECTED NOT DETECTED   Proteus species NOT DETECTED NOT DETECTED   Salmonella species NOT DETECTED NOT DETECTED   Serratia marcescens NOT DETECTED NOT DETECTED   Haemophilus influenzae NOT DETECTED NOT DETECTED    Neisseria meningitidis NOT DETECTED NOT DETECTED   Pseudomonas aeruginosa NOT DETECTED NOT DETECTED   Stenotrophomonas maltophilia NOT DETECTED NOT DETECTED   Candida albicans NOT DETECTED NOT DETECTED   Candida auris NOT DETECTED NOT DETECTED   Candida glabrata NOT DETECTED NOT DETECTED   Candida krusei NOT DETECTED NOT DETECTED   Candida parapsilosis NOT DETECTED NOT DETECTED   Candida tropicalis NOT DETECTED NOT DETECTED   Cryptococcus neoformans/gattii NOT DETECTED NOT DETECTED    Alanda Slim, PharmD, FCCM Clinical Pharmacist Please see AMION for all Pharmacists' Contact Phone Numbers 08/06/2022, 2:02 PM

## 2022-08-06 NOTE — Consult Note (Signed)
Cardiology Consultation   Patient ID: Michelle Cohen MRN: [id number]; DOB: 1951/05/02  Admit date: 08/03/2022 Date of Consult: 08/06/2022  PCP:  Michelle Jewel, MD   [facility] Providers Cardiologist:  None        Patient Profile:   Michelle Cohen is a 71 y.o. 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  Cardiology was consulted given significant troponin elevation and concern for ST elevation.  History of Present Illness:   Michelle Cohen initially presented with nausea, vomiting and hypotension with systolic blood pressure in the 50s and lactate 10 with an AKI and severe leukocytosis and thrombocytopenia consistent with severe septic shock.  She required vasopressor support and was intubated.  Infectious work-up was consistent with GNR bacteremia and CT showed right-sided pyelonephritis.  There is also been concern for DIC.  Her renal function has progressively worsened though her LFTs continue to improve.  Leukocytosis now 51,000.  Her lactate remains elevated and has up trended recently to 5.8.  She remains intubated and on both Levophed and vasopressin with continuous sodium bicarbonate infusion.  Her troponin was found to be greater than 24,000 and EKG was concerning for lateral ST elevations.   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    Past Surgical History:  Procedure Laterality Date   left inguinal hernia repair Left    SPLENECTOMY, TOTAL  02/13/2018       Inpatient Medications: Scheduled Meds:  sodium chloride   Intravenous Once   Chlorhexidine Gluconate Cloth  6 each Topical Daily   docusate  100 mg Per Tube BID   hydrocortisone sod succinate (SOLU-CORTEF) inj  100 mg Intravenous Q12H   insulin aspart  0-9 Units Subcutaneous Q4H   mouth rinse  15 mL Mouth Rinse Q2H   pantoprazole (PROTONIX) IV  40  mg Intravenous Daily   polyethylene glycol  17 g Per Tube Daily   sodium bicarbonate  100 mEq Intravenous Once   sodium chloride flush  10-40 mL Intracatheter Q12H   Continuous Infusions:  dexmedetomidine (PRECEDEX) IV infusion 0.9 mcg/kg/hr (08/06/22 2200)   fentaNYL infusion INTRAVENOUS 150 mcg/hr (08/06/22 2200)   Immune Globulin 10% 154 mL/hr at 08/06/22 2200   meropenem (MERREM) IV Stopped (08/06/22 2149)   norepinephrine (LEVOPHED) Adult infusion 38 mcg/min (08/06/22 2200)   propofol (DIPRIVAN) infusion 10 mcg/kg/min (08/06/22 2321)   sodium bicarbonate 150 mEq in dextrose 5 % 1,150 mL infusion 150 mL/hr at 08/06/22 2200   vasopressin 0.04 Units/min (08/06/22 2200)   PRN Meds: acetaminophen, fentaNYL, midazolam, mouth rinse, sodium chloride flush  Allergies:    Allergies  Allergen Reactions   Allopurinol Rash    Whole body rash accompanied by swelling   Tape Itching and Rash    medipore tape    Social History:   Social History   Socioeconomic History   Marital status: Single    Spouse name: Not on file   Number of children: Not on file   Years of education: Not on file   Highest education level: Not on file  Occupational History   Not on file  Tobacco Use   Smoking status: Never   Smokeless tobacco: Never  Vaping Use   Vaping Use: Never used  Substance and Sexual Activity   Alcohol use: Yes    Comment: glass of wine occasionally   Drug use: No  Sexual activity: Not on file  Other Topics Concern   Not on file  Social History Narrative   Not on file   Social Determinants of Health   Financial Resource Strain: Not on file  Food Insecurity: Not on file  Transportation Needs: Not on file  Physical Activity: Not on file  Stress: Not on file  Social Connections: Not on file  Intimate Partner Violence: Not on file    Family History:    Family History  Problem Relation Age of Onset   Lung cancer Sister      ROS:  Please see the history of present  illness.   All other ROS reviewed and negative.     Physical Exam/Data:   Vitals:   08/06/22 2145 08/06/22 2200 08/06/22 2215 08/06/22 2230  BP:      Pulse:      Resp: 20 (!) 28 (!) 27 18  Temp:      TempSrc:      SpO2:      Weight:      Height:        Intake/Output Summary (Last 24 hours) at 08/06/2022 2322 Last data filed at 08/06/2022 2200 Gross per 24 hour  Intake 7951.73 ml  Output 170 ml  Net 7781.73 ml      08/06/2022    4:00 AM 05/06/2022   11:46 AM 10/06/2021    2:37 PM  Last 3 Weights  Weight (lbs) 164 lb 164 lb 4.8 oz 164 lb 12.8 oz  Weight (kg) 74.39 kg 74.526 kg 74.753 kg     Body mass index is 27.29 kg/m.  General: Intubated and sedated HEENT: normal Neck: no JVD Cardiac: Tachycardic and regular, no murmur Lungs: Mechanically ventilated lung sounds with no crackles anteriorly Abd: soft, nontender, no hepatomegaly  Ext: Cool feet, warm thighs, no edema  EKG:  The EKG was personally reviewed and demonstrates: Normal sinus rhythm, sub-1 mm ST elevation in V6, 1 and aVL  Relevant CV Studies: Previous echocardiogram  Laboratory Data:  High Sensitivity Troponin:   Recent Labs  Lab 08/23/2022 1700 08/14/2022 1825 08/06/22 2008  TROPONINIHS 157* 184* >24,000*     Chemistry Recent Labs  Lab 08/09/2022 1700 08/22/2022 2005 08/13/2022 2357 08/06/22 0455 08/06/22 0546 08/06/22 0950 08/06/22 1325 08/06/22 1344  NA 139   < >  --  137 138  --  138 134*  K 3.4*   < >  --  2.9* 3.1* 4.0 4.2 3.9  CL 100  --   --  106  --   --  100  --   CO2 11*  --   --  18*  --   --  19*  --   GLUCOSE 79  --   --  142*  --   --  199*  --   BUN 34*  --   --  41*  --   --  47*  --   CREATININE 2.74*  --  2.71* 3.10*  --   --  3.64*  --   CALCIUM 9.1  --   --  7.7*  --   --  6.8*  --   GFRNONAA 18*  --  19* 16*  --   --  13*  --   ANIONGAP 28*  --   --  13  --   --  19*  --    < > = values in this interval not displayed.    Recent Labs  Lab 08/18/2022 1700  08/06/22 0455 08/06/22 1325  PROT 6.3* 4.5* 4.9*  ALBUMIN 3.9 2.5* 2.3*  AST 690* 497* 325*  ALT 493* 445* 300*  ALKPHOS 103 88 73  BILITOT 2.9* 3.5* 3.9*   Lipids  Recent Labs  Lab 08/06/22 0954  TRIG 178*    Hematology Recent Labs  Lab 08/22/2022 2357 08/06/22 0455 08/06/22 0546 08/06/22 0953 08/06/22 1325 08/06/22 1344  WBC 38.5* 49.1*  --   --  51.0*  --   RBC 4.14 4.12  --   --  3.89  --   HGB 13.3 13.2 13.3  --  12.4 12.2  HCT 39.5 38.6 39.0  --  36.1 36.0  MCV 95.4 93.7  --   --  92.8  --   MCH 32.1 32.0  --   --  31.9  --   MCHC 33.7 34.2  --   --  34.3  --   RDW 13.6 13.6  --   --  13.9  --   PLT 62* 37*  --  33* 15*  --    Thyroid  Recent Labs  Lab 08/15/2022 1825  TSH 2.092    BNP Recent Labs  Lab 08/10/2022 1700  BNP 312.6*    DDimer  Recent Labs  Lab 08/06/22 0953  DDIMER >20.00*     Radiology/Studies:  DG Abd 1 View  Result Date: 08/06/2022 CLINICAL DATA:  71 year old female intubated, enteric tube placement. EXAM: ABDOMEN - 1 VIEW COMPARISON:  Portable chest 0552 hours today. FINDINGS: Portable AP semi upright view at 0553 hours. Enteric tube looped in the stomach which is gas distended. Increased gas-filled proximal small bowel loops also when compared to the CT Chest, Abdomen, and Pelvis yesterday. Patchy and confluent retrocardiac opacity at the left lung base. IMPRESSION: 1. Satisfactory enteric tube placement into the stomach which is gas distended along with some proximal small bowel. Consider NG tube to suction. 2. Left lower lobe collapse or consolidation. Electronically Signed   By: Genevie Ann M.D.   On: 08/06/2022 06:41   DG Chest 1 View  Result Date: 08/06/2022 CLINICAL DATA:  71 year old female intubated, enteric tube placement. EXAM: CHEST  1 VIEW COMPARISON:  0105 hours portable chest today. FINDINGS: Portable AP semi upright view at 0552 hours. Trachea is located to the right of midline as demonstrated by chest CT yesterday.  Endotracheal tube does project over the tracheal air column and tip is above the carina. Enteric tube courses to the abdomen and is looped in the left upper quadrant. There is moderate to severe gas distension of the stomach. Left subclavian central line is stable. Stable somewhat low lung volumes. Increased retrocardiac opacity which now resembles consolidation, largely new from the chest CT yesterday. No pneumothorax or pulmonary edema. No definite pleural effusion. No acute osseous abnormality identified. IMPRESSION: 1. Satisfactory ET tube and enteric tube placement. Gas distended stomach, consider NG tube to suction. 2. Increasing left lower lobe consolidation. Electronically Signed   By: Genevie Ann M.D.   On: 08/06/2022 06:40   DG CHEST PORT 1 VIEW  Result Date: 08/06/2022 CLINICAL DATA:  [id number] Encounter for central line placement [id number] EXAM: PORTABLE CHEST 1 VIEW COMPARISON:  CT chest 08/24/2022. chest x-ray 08/13/2022 FINDINGS: Interval placement of a left subclavian access central venous catheter with tip overlying the expected region the superior cavoatrial junction. The heart and mediastinal contours are unremarkable given AP portable technique. Retrocardiac airspace opacity likely atelectasis. Mildly increased interstitial markings. No pleural effusion. No pneumothorax. No  acute osseous abnormality. IMPRESSION: 1. Mild pulmonary edema. 2. Retrocardiac airspace opacity likely atelectasis. 3. Interval placement of a left subclavian venous catheter with tip overlying the expected region the superior cavoatrial junction. Electronically Signed   By: Iven Finn M.D.   On: 08/06/2022 01:47   CT CHEST ABDOMEN PELVIS WO CONTRAST  Result Date: 08/19/2022 CLINICAL DATA:  Sepsis EXAM: CT CHEST, ABDOMEN AND PELVIS WITHOUT CONTRAST TECHNIQUE: Multidetector CT imaging of the chest, abdomen and pelvis was performed following the standard protocol without IV contrast. RADIATION DOSE REDUCTION: This exam was  performed according to the departmental dose-optimization program which includes automated exposure control, adjustment of the mA and/or kV according to patient size and/or use of iterative reconstruction technique. COMPARISON:  CT 12/31/2019 FINDINGS: CT CHEST FINDINGS Cardiovascular: Limited evaluation without intravenous contrast. Mild atherosclerosis. No aneurysm. Normal cardiac size. Trace pericardial effusion Mediastinum/Nodes: Midline trachea. No thyroid mass. No suspicious lymph nodes. Esophagus within normal limits. Lungs/Pleura: No consolidation or pleural effusion. Mild dependent atelectasis. Musculoskeletal: Sternum is intact.  No acute osseous abnormality. CT ABDOMEN PELVIS FINDINGS Hepatobiliary: No focal liver abnormality is seen. No gallstones, gallbladder wall thickening, or biliary dilatation. Pancreas: Unremarkable. No pancreatic ductal dilatation or surrounding inflammatory changes. Spleen: Splenectomy Adrenals/Urinary Tract: Adrenal glands are within normal limits. Hazy bilateral perinephric fat stranding and fat stranding at the renal pelvises left greater than right. No hydronephrosis. No obstructing stone. The bladder is unremarkable Stomach/Bowel: The stomach is nonenlarged. No dilated small bowel. Collapsed appearance of the colon, no inflammation. Mild diverticular disease of the left colon. Appendix contains small stone but is otherwise normal in appearance. Vascular/Lymphatic: Mild atherosclerosis. No aneurysm. No suspicious lymph nodes Reproductive: Uterus and bilateral adnexa are unremarkable. Other: Negative for pelvic effusion or free air. Periumbilical ventral hernia now containing fat and loop of small bowel but no obstruction or incarceration. Musculoskeletal: No acute osseous abnormality. IMPRESSION: 1. No CT evidence for acute intrathoracic abnormality. 2. Hazy bilateral perinephric fat stranding and fat stranding at the renal pelvises left greater than right, findings are  suggestive upper urinary tract infection/pyelonephritis. No hydronephrosis or obstructing stone. Correlate with urinalysis 3. Periumbilical ventral hernia now containing fat and loop of small bowel but no obstruction or incarceration. 4. Mild diverticular disease of the left colon without acute inflammatory process. Electronically Signed   By: Donavan Foil M.D.   On: 08/22/2022 20:00   CT Head Wo Contrast  Result Date: 07/29/2022 CLINICAL DATA:  Delirium. EXAM: CT HEAD WITHOUT CONTRAST TECHNIQUE: Contiguous axial images were obtained from the base of the skull through the vertex without intravenous contrast. RADIATION DOSE REDUCTION: This exam was performed according to the departmental dose-optimization program which includes automated exposure control, adjustment of the mA and/or kV according to patient size and/or use of iterative reconstruction technique. COMPARISON:  None Available. FINDINGS: Brain: The ventricles and sulci are appropriate size for the patient's age. The gray-white matter discrimination is preserved. There is no acute intracranial hemorrhage. No mass effect or midline shift. No extra-axial fluid collection. Vascular: No hyperdense vessel or unexpected calcification. Skull: Normal. Negative for fracture or focal lesion. Sinuses/Orbits: No acute finding. Other: None IMPRESSION: No acute intracranial pathology. Electronically Signed   By: Anner Crete M.D.   On: 08/22/2022 19:53   DG Chest Port 1 View  Result Date: 08/07/2022 CLINICAL DATA:  Possible sepsis EXAM: PORTABLE CHEST 1 VIEW COMPARISON:  10/09/2018 FINDINGS: The heart size and mediastinal contours are within normal limits. Both lungs are clear. The visualized skeletal  structures are unremarkable. Minimal atelectasis at the lingula and left base. IMPRESSION: No active disease. Minimal atelectasis at the lingula and left base. Electronically Signed   By: Donavan Foil M.D.   On: 08/23/2022 17:29     Assessment and Plan:    #NSTEMI, type I versus type II Michelle Cohen is in septic shock on 2 pressors and mechanically ventilated with concerns for DIC.  Her troponin has dramatically increased to > 24 K and EKG is concerning for ~1 mm ST elevation in lead I and aVL with submillimeter ST elevation in V6.  Together, consistent with NSTEMI type I versus type II secondary to septic shock.  At this time, her platelets are 15 K likely due to known thrombocytopenia and likely DIC.  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  At this time, would not pursue cardiac catheterization as patient not able to tolerate anticoagulation or antiplatelets.  Primary team also communicating with family regarding goals of care. - No cardiac catheterization at this time - Would consider anticoagulation/antiplatelets once platelet count is > 50 K - Agree with TTE in the a.m. - If she recovers from sepsis and is able to be extubated, can reevaluate cardiac catheterization at that time  Risk Assessment/Risk Scores:     TIMI Risk Score for Unstable Angina or Non-ST Elevation MI:   The patient's TIMI risk score is 3, which indicates a 13% risk of all cause mortality, new or recurrent myocardial infarction or need for urgent revascularization in the next 14 days.          For questions or updates, please contact [place] Please consult [email or web address] for contact info under    Signed, Jeralene Huff, MD  08/06/2022 11:22 PM

## 2022-08-06 NOTE — Procedures (Signed)
Intubation Procedure Note  Michelle Cohen  [id number]  06/28/1951  Date:08/06/22  Time:4:14 AM   Provider Performing:Tirza Senteno    Procedure: Intubation (31500)  Indication(s) Respiratory Failure  Consent Risks of the procedure as well as the alternatives and risks of each were explained to the patient and/or caregiver.  Consent for the procedure was obtained and is signed in the bedside chart   Anesthesia Etomidate and Rocuronium   Time Out Verified patient identification, verified procedure, site/side was marked, verified correct patient position, special equipment/implants available, medications/allergies/relevant history reviewed, required imaging and test results available.   Sterile Technique Usual hand hygeine, masks, and gloves were used   Procedure Description Patient positioned in bed supine.  Sedation given as noted above.  Patient was intubated with endotracheal tube using  MAC4 .  View was Grade 1 full glottis .  Number of attempts was 1.  Colorimetric CO2 detector was consistent with tracheal placement.   Complications/Tolerance None; patient tolerated the procedure well. Chest X-ray is ordered to verify placement.   EBL Minimal   Specimen(s) None

## 2022-08-06 NOTE — Progress Notes (Addendum)
eLink Physician-Brief Progress Note Patient Name: Michelle Cohen DOB: 1951-06-19 MRN: [id number]   Date of Service  08/06/2022  HPI/Events of Note  Patient with lymphoma and s/p splenectomy on Rituxan who presents with septic shock secondary to acute pyelonephritis + / - gastroenteritis, acute kidney / liver injury,  acute respiratory failure on BIPAP, and demand related myocardial ischemia.  eICU Interventions  New Patient Evaluation.        Kerry Kass Coleston Dirosa 08/06/2022, 12:11 AM

## 2022-08-07 ENCOUNTER — Inpatient Hospital Stay (HOSPITAL_COMMUNITY): Payer: Medicare Other

## 2022-08-07 DIAGNOSIS — I2121 ST elevation (STEMI) myocardial infarction involving left circumflex coronary artery: Secondary | ICD-10-CM

## 2022-08-07 DIAGNOSIS — R6521 Severe sepsis with septic shock: Secondary | ICD-10-CM | POA: Diagnosis not present

## 2022-08-07 DIAGNOSIS — R0603 Acute respiratory distress: Secondary | ICD-10-CM | POA: Diagnosis not present

## 2022-08-07 DIAGNOSIS — A419 Sepsis, unspecified organism: Secondary | ICD-10-CM | POA: Diagnosis not present

## 2022-08-07 LAB — COMPREHENSIVE METABOLIC PANEL
ALT: 238 U/L — ABNORMAL HIGH (ref 0–44)
AST: 355 U/L — ABNORMAL HIGH (ref 15–41)
Albumin: 1.9 g/dL — ABNORMAL LOW (ref 3.5–5.0)
Alkaline Phosphatase: 59 U/L (ref 38–126)
Anion gap: 17 — ABNORMAL HIGH (ref 5–15)
BUN: 54 mg/dL — ABNORMAL HIGH (ref 8–23)
CO2: 23 mmol/L (ref 22–32)
Calcium: 5.7 mg/dL — CL (ref 8.9–10.3)
Chloride: 95 mmol/L — ABNORMAL LOW (ref 98–111)
Creatinine, Ser: 4.34 mg/dL — ABNORMAL HIGH (ref 0.44–1.00)
GFR, Estimated: 11 mL/min — ABNORMAL LOW (ref >60)
Glucose, Bld: 181 mg/dL — ABNORMAL HIGH (ref 70–99)
Potassium: 5 mmol/L (ref 3.5–5.1)
Sodium: 135 mmol/L (ref 135–145)
Total Bilirubin: 3.5 mg/dL — ABNORMAL HIGH (ref 0.3–1.2)
Total Protein: 4.5 g/dL — ABNORMAL LOW (ref 6.5–8.1)

## 2022-08-07 LAB — PHOSPHORUS: Phosphorus: 6.5 mg/dL — ABNORMAL HIGH (ref 2.5–4.6)

## 2022-08-07 LAB — ECHOCARDIOGRAM COMPLETE
Area-P 1/2: 3.42 cm2
Calc EF: 42.6 %
Height: 65 in
S' Lateral: 3.5 cm
Single Plane A2C EF: 42.4 %
Single Plane A4C EF: 40.6 %
Weight: 2624 oz

## 2022-08-07 LAB — PROTIME-INR
INR: 2.7 — ABNORMAL HIGH (ref 0.8–1.2)
Prothrombin Time: 28.1 seconds — ABNORMAL HIGH (ref 11.4–15.2)

## 2022-08-07 LAB — BPAM PLATELET PHERESIS
Blood Product Expiration Date: 202311132359
ISSUE DATE / TIME: 202311111547
Unit Type and Rh: 6200

## 2022-08-07 LAB — CBC
HCT: 33.2 % — ABNORMAL LOW (ref 36.0–46.0)
Hemoglobin: 11.5 g/dL — ABNORMAL LOW (ref 12.0–15.0)
MCH: 31.4 pg (ref 26.0–34.0)
MCHC: 34.6 g/dL (ref 30.0–36.0)
MCV: 90.7 fL (ref 80.0–100.0)
Platelets: 10 10*3/uL — CL (ref 150–400)
RBC: 3.66 MIL/uL — ABNORMAL LOW (ref 3.87–5.11)
RDW: 13.9 % (ref 11.5–15.5)
WBC: 54.9 10*3/uL (ref 4.0–10.5)
nRBC: 0 % (ref 0.0–0.2)

## 2022-08-07 LAB — PREPARE PLATELET PHERESIS: Unit division: 0

## 2022-08-07 LAB — TRIGLYCERIDES: Triglycerides: 224 mg/dL — ABNORMAL HIGH (ref <150)

## 2022-08-07 LAB — GLUCOSE, CAPILLARY
Glucose-Capillary: 169 mg/dL — ABNORMAL HIGH (ref 70–99)
Glucose-Capillary: 127 mg/dL — ABNORMAL HIGH (ref 70–99)

## 2022-08-07 LAB — URINE CULTURE: Culture: NO GROWTH

## 2022-08-07 LAB — MAGNESIUM: Magnesium: 1.3 mg/dL — ABNORMAL LOW (ref 1.7–2.4)

## 2022-08-07 MED ORDER — GLYCOPYRROLATE 0.2 MG/ML IJ SOLN: 0.2000 mg | INTRAMUSCULAR | Status: DC | PRN

## 2022-08-07 MED ORDER — POLYVINYL ALCOHOL 1.4 % OP SOLN: 1.0000 [drp] | Freq: Four times a day (QID) | OPHTHALMIC | Status: DC | PRN

## 2022-08-07 MED ORDER — METRONIDAZOLE 500 MG/100ML IV SOLN: 500.0000 mg | Freq: Three times a day (TID) | INTRAVENOUS | Status: DC

## 2022-08-07 MED ORDER — SODIUM CHLORIDE 0.9 % IV SOLN: INTRAVENOUS | Status: DC

## 2022-08-07 MED ORDER — ACETAMINOPHEN 325 MG PO TABS: 650.0000 mg | ORAL_TABLET | Freq: Four times a day (QID) | ORAL | Status: DC | PRN

## 2022-08-07 MED ORDER — CALCIUM GLUCONATE-NACL 2-0.675 GM/100ML-% IV SOLN
2.0000 g | Freq: Once | INTRAVENOUS | Status: AC
  Administered 2022-08-07: 2000 mg via INTRAVENOUS
  Filled 2022-08-07: qty 100

## 2022-08-07 MED ORDER — MORPHINE 100MG IN NS 100ML (1MG/ML) PREMIX INFUSION
0.0000 mg/h | INTRAVENOUS | Status: DC
  Administered 2022-08-07 (×3): 40 mg/h via INTRAVENOUS
  Administered 2022-08-07: 20 mg/h via INTRAVENOUS
  Administered 2022-08-07: 5 mg/h via INTRAVENOUS
  Administered 2022-08-08 (×2): 40 mg/h via INTRAVENOUS
  Filled 2022-08-07 (×7): qty 100

## 2022-08-07 MED ORDER — ACETAMINOPHEN 650 MG RE SUPP: 650.0000 mg | Freq: Four times a day (QID) | RECTAL | Status: DC | PRN

## 2022-08-07 MED ORDER — LORAZEPAM 2 MG/ML IJ SOLN
2.0000 mg | INTRAMUSCULAR | Status: DC | PRN
  Administered 2022-08-08: 2 mg via INTRAVENOUS
  Filled 2022-08-07: qty 1

## 2022-08-07 MED ORDER — PERFLUTREN LIPID MICROSPHERE
1.0000 mL | INTRAVENOUS | Status: AC | PRN
  Administered 2022-08-07: 4 mL via INTRAVENOUS

## 2022-08-07 MED ORDER — MAGNESIUM SULFATE 2 GM/50ML IV SOLN
2.0000 g | Freq: Once | INTRAVENOUS | Status: AC
  Administered 2022-08-07: 2 g via INTRAVENOUS
  Filled 2022-08-07: qty 50

## 2022-08-07 MED ORDER — MORPHINE BOLUS VIA INFUSION
5.0000 mg | INTRAVENOUS | Status: DC | PRN
  Administered 2022-08-07 – 2022-08-08 (×6): 5 mg via INTRAVENOUS

## 2022-08-07 MED ORDER — GLYCOPYRROLATE 1 MG PO TABS: 1.0000 mg | ORAL_TABLET | ORAL | Status: DC | PRN

## 2022-08-07 NOTE — Progress Notes (Addendum)
Hypocalcemia / hypomag -repleted  Michelle Cohen. [name] MD

## 2022-08-07 NOTE — Progress Notes (Signed)
   NAME:  Michelle Cohen, MRN:  [id number], DOB:  May 29, 1951, LOS: 2 ADMISSION DATE:  08/22/2022, CONSULTATION DATE:  11/10 REFERRING MD:  Pearline Cables, CHIEF COMPLAINT:  nausea, vomiting, malase   History of Present Illness:   Michelle Cohen, is a 71 y.o. 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 No melena or BRBPR. Generalized weakness. Denies sick contacts and recent abx. Endorses nonproductive cough, dyspnea, rigors, subjective fever.  At [facility] she was found to be hypotensive (BP 57/47), tachycardic, WBC 20.7, lactate greater than 9, trop 157, creat 2.47. CT chest/abd/pel WO concerning for possible right pyelo. Code sepsis initiated. 2L LR ordered. Patient started on levophed. BC ordered. Vanc/Cefe/Flagyl ordered. She was started on BiPAP for WOB. ED to ED transfer initiated. Marland Kitchen  PCCM was consulted for admission   Pertinent  Medical History  B cell lymphoma and splenectomy   Significant Hospital Events: Including procedures, antibiotic start and stop dates in addition to other pertinent events   11/10 Presented to [facility], Cefepime, Vanc, flagyl>, BC>, Head ct Neg, CT chest/abd/pel wo- ?pyelo   Interim History / Subjective:  STEMI last night. Remains on similar pressors, less responsive, more mottled.  Family at bedside is at peace.  Objective   Blood pressure 92/67, pulse (!) 199, temperature (!) 101.7 F (38.7 C), temperature source Axillary, resp. rate (!) 27, height '5\' 5"'$  (1.651 m), weight 74.4 kg, SpO2 99 %. CVP:  [11 mmHg] 11 mmHg  Vent Mode: PRVC FiO2 (%):  [40 %-60 %] 40 % Set Rate:  [35 bmp] 35 bmp Vt Set:  [340 mL] 340 mL PEEP:  [5 cmH20] 5 cmH20 Plateau Pressure:  [14 cmH20] 14 cmH20   Intake/Output Summary (Last 24 hours) at 08/07/2022 1040 Last data filed at 08/07/2022 1032 Gross  per 24 hour  Intake 8644.33 ml  Output 983 ml  Net 7661.33 ml    Filed Weights   08/06/22 0400  Weight: 74.4 kg    Examination: Ill appearing mottled woman in NAD No urine output Lungs diminished bases Ext mottled Poorly responsive compared to yesterday  Cr worsening WBC rising Plts still dropping  Resolved Hospital Problem list     Assessment & Plan:  Overwhelming bacterial sepsis with multiorgan dysfunction (kidney, liver, skin, heart, bone marrow)- question pyelo but urine is equivocal.  GI symptoms preceding. 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  RN Shea Stakes and I discussed with Caren Griffins what [place] would have wanted and all in agreement we should allow a natural peaceful death.  Condolences offered.  In hospital death expected.  32 min cc time Erskine Emery MD PCCM

## 2022-08-07 NOTE — Progress Notes (Signed)
  Echocardiogram 2D Echocardiogram has been performed.  Michelle Cohen 08/07/2022, 8:34 AM

## 2022-08-07 NOTE — Progress Notes (Signed)
DAILY PROGRESS NOTE   Patient Name: Michelle Cohen Date of Encounter: 08/07/2022 Cardiologist: None  Chief Complaint   Not responsive, sedated  Patient Profile   Michelle Cohen is a 71 y.o. 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  Cardiology was consulted given significant troponin elevation and concern for ST elevation.   Subjective   Critically ill with multi-organ system failure due to overwhelming sepsis and history of splenectomy. Echo shows LVEF 40-45% with inferior and inferolateral hypokinesis, suspicious for LCX territory STEMI. Not a candidate for intervention.   Objective   Vitals:   08/07/22 0800 08/07/22 0815 08/07/22 0830 08/07/22 0850  BP: 92/67     Pulse: (!) 199     Resp: (!) 35 (!) 32 (!) 27   Temp:      TempSrc:      SpO2: 99%   100%  Weight:      Height:        Intake/Output Summary (Last 24 hours) at 08/07/2022 1146 Last data filed at 08/07/2022 1144 Gross per 24 hour  Intake 8548.79 ml  Output 983 ml  Net 7565.79 ml   Filed Weights   08/06/22 0400  Weight: 74.4 kg    Physical Exam   General appearance: sedated, no distress Lungs: clear to auscultation bilaterally and slow respirations Heart: regular rate and rhythm Extremities: extremities normal, atraumatic, no cyanosis or edema Neurologic: Mental status: sedated  Inpatient Medications    Scheduled Meds:  sodium chloride   Intravenous Once   sodium chloride flush  10-40 mL Intracatheter Q12H    Continuous Infusions:  sodium chloride     morphine 20 mg/hr (08/07/22 1144)   norepinephrine (LEVOPHED) Adult infusion 30 mcg/min (08/07/22 1144)   propofol (DIPRIVAN) infusion 10 mcg/kg/min (08/07/22 1144)   vasopressin 0.03 Units/min (08/07/22 1144)    PRN Meds: acetaminophen **OR** acetaminophen, acetaminophen, fentaNYL, glycopyrrolate **OR**  glycopyrrolate **OR** glycopyrrolate, LORazepam, midazolam, morphine, mouth rinse, polyvinyl alcohol, sodium chloride flush   Labs   Results for orders placed or performed during the hospital encounter of 07/30/2022 (from the past 48 hour(s))  Culture, blood (Routine x 2)     Status: None (Preliminary result)   Collection Time: 08/04/2022  4:50 PM   Specimen: BLOOD RIGHT WRIST  Result Value Ref Range   Specimen Description      BLOOD RIGHT WRIST Performed at [facility], [address] [place], [place] [address]    Special Requests      Blood Culture adequate volume BOTTLES DRAWN AEROBIC AND ANAEROBIC Performed at [facility], [address] [place], [place] [address]    Culture  Setup Time      GRAM NEGATIVE RODS IN BOTH AEROBIC AND ANAEROBIC BOTTLES CRITICAL VALUE NOTED.  VALUE IS CONSISTENT WITH PREVIOUSLY REPORTED AND CALLED VALUE.    Culture      GRAM NEGATIVE RODS CULTURE REINCUBATED FOR BETTER GROWTH Performed at [facility], [place] [address], [place], [place] [address]    Report Status PENDING   Culture, blood (Routine x 2)     Status: None (Preliminary result)   Collection Time: 08/09/2022  4:58 PM   Specimen: BLOOD RIGHT WRIST  Result Value Ref Range   Specimen Description      BLOOD RIGHT WRIST Performed at [facility] [address] [place], [place] [address]    Special Requests  Blood Culture adequate volume BOTTLES DRAWN AEROBIC AND ANAEROBIC Performed at [facility], [place], [place] [address]    Culture  Setup Time      GRAM NEGATIVE RODS IN BOTH AEROBIC AND ANAEROBIC BOTTLES CRITICAL RESULT CALLED TO, READ BACK BY AND VERIFIED WITH:  C/ PHARMD C. PIERCE 08/06/22 1330 A. LAFRANCE    Culture      GRAM NEGATIVE RODS CULTURE REINCUBATED FOR BETTER GROWTH Performed at [facility], [place] [address] [place], [place] [address]    Report Status PENDING   Blood Culture ID  Panel (Reflexed)     Status: None   Collection Time: 08/24/2022  4:58 PM  Result Value Ref Range   Enterococcus faecalis NOT DETECTED NOT DETECTED   Enterococcus Faecium NOT DETECTED NOT DETECTED   Listeria monocytogenes NOT DETECTED NOT DETECTED   Staphylococcus species NOT DETECTED NOT DETECTED   Staphylococcus aureus (BCID) NOT DETECTED NOT DETECTED   Staphylococcus epidermidis NOT DETECTED NOT DETECTED   Staphylococcus lugdunensis NOT DETECTED NOT DETECTED   Streptococcus species NOT DETECTED NOT DETECTED   Streptococcus agalactiae NOT DETECTED NOT DETECTED   Streptococcus pneumoniae NOT DETECTED NOT DETECTED   Streptococcus pyogenes NOT DETECTED NOT DETECTED   A.calcoaceticus-baumannii NOT DETECTED NOT DETECTED   Bacteroides fragilis NOT DETECTED NOT DETECTED   Enterobacterales NOT DETECTED NOT DETECTED   Enterobacter cloacae complex NOT DETECTED NOT DETECTED   Escherichia coli NOT DETECTED NOT DETECTED   Klebsiella aerogenes NOT DETECTED NOT DETECTED   Klebsiella oxytoca NOT DETECTED NOT DETECTED   Klebsiella pneumoniae NOT DETECTED NOT DETECTED   Proteus species NOT DETECTED NOT DETECTED   Salmonella species NOT DETECTED NOT DETECTED   Serratia marcescens NOT DETECTED NOT DETECTED   Haemophilus influenzae NOT DETECTED NOT DETECTED   Neisseria meningitidis NOT DETECTED NOT DETECTED   Pseudomonas aeruginosa NOT DETECTED NOT DETECTED   Stenotrophomonas maltophilia NOT DETECTED NOT DETECTED   Candida albicans NOT DETECTED NOT DETECTED   Candida auris NOT DETECTED NOT DETECTED   Candida glabrata NOT DETECTED NOT DETECTED   Candida krusei NOT DETECTED NOT DETECTED   Candida parapsilosis NOT DETECTED NOT DETECTED   Candida tropicalis NOT DETECTED NOT DETECTED   Cryptococcus neoformans/gattii NOT DETECTED NOT DETECTED    Comment: Performed at [facility], [address] [place], [place] [address]  Comprehensive metabolic panel     Status: Abnormal   Collection Time:  08/04/2022  5:00 PM  Result Value Ref Range   Sodium 139 135 - 145 mmol/L    Comment: Electrolytes repeated to confirm.   Potassium 3.4 (L) 3.5 - 5.1 mmol/L    Comment: Electrolytes repeated to confirm.   Chloride 100 98 - 111 mmol/L    Comment: Electrolytes repeated to confirm.   CO2 11 (L) 22 - 32 mmol/L    Comment: Electrolytes repeated to confirm.   Glucose, Bld 79 70 - 99 mg/dL    Comment: Glucose reference range applies only to samples taken after fasting for at least 8 hours.   BUN 34 (H) 8 - 23 mg/dL   Creatinine, Ser 2.74 (H) 0.44 - 1.00 mg/dL   Calcium 9.1 8.9 - 10.3 mg/dL   Total Protein 6.3 (L) 6.5 - 8.1 g/dL   Albumin 3.9 3.5 - 5.0 g/dL   AST 690 (H) 15 - 41 U/L   ALT 493 (H) 0 - 44 U/L   Alkaline Phosphatase 103 38 - 126 U/L   Total Bilirubin 2.9 (  H) 0.3 - 1.2 mg/dL   GFR, Estimated 18 (L) >60 mL/min    Comment: (NOTE) Calculated using the CKD-EPI Creatinine Equation (2021)    Anion gap 28 (H) 5 - 15    Comment: REPEATED TO VERIFY Performed at [facility], [place], [place] [address]   Lactic acid, plasma     Status: Abnormal   Collection Time: 08/11/2022  5:00 PM  Result Value Ref Range   Lactic Acid, Venous >9.0 (HH) 0.5 - 1.9 mmol/L    Comment: CRITICAL RESULT CALLED TO, READ BACK BY AND VERIFIED WITH: leslie schular, rn [id number] src 11/10  Performed at [facility], [address], [place], [place] [address]   CBC with Differential     Status: Abnormal   Collection Time: 07/29/2022  5:00 PM  Result Value Ref Range   WBC 20.7 (H) 4.0 - 10.5 K/uL   RBC 5.09 3.87 - 5.11 MIL/uL   Hemoglobin 16.3 (H) 12.0 - 15.0 g/dL   HCT 50.1 (H) 36.0 - 46.0 %   MCV 98.4 80.0 - 100.0 fL   MCH 32.0 26.0 - 34.0 pg   MCHC 32.5 30.0 - 36.0 g/dL   RDW 13.6 11.5 - 15.5 %   Platelets 105 (L) 150 - 400 K/uL   nRBC 0.0 0.0 - 0.2 %   Neutrophils Relative % 69 %   Neutro Abs 14.3 (H) 1.7 - 7.7 K/uL   Lymphocytes Relative 20 %    Lymphs Abs 4.1 (H) 0.7 - 4.0 K/uL   Monocytes Relative 1 %   Monocytes Absolute 0.2 0.1 - 1.0 K/uL   Eosinophils Relative 1 %   Eosinophils Absolute 0.2 0.0 - 0.5 K/uL   Basophils Relative 0 %   Basophils Absolute 0.0 0.0 - 0.1 K/uL   WBC Morphology INCREASED BANDS (>20% BANDS)     Comment: MODERATE LEFT SHIFT (>5% METAS AND MYELOS,OCC PRO NOTED)   RBC Morphology Acanthocytes present    Other BACTERIA PRESENT, CONFIRMED BY REDRAW. %    Comment: CALLED TO J BARWICK,RN 1853 08/23/2022 DB   Metamyelocytes Relative 5 %   Myelocytes 4 %   Abs Immature Granulocytes 1.90 (H) 0.00 - 0.07 K/uL   Tear Drop Cells PRESENT    Tammy Sours Bodies PRESENT    Giant PLTs PRESENT     Comment: Performed at [facility], [address] [place], [place] [address]  Protime-INR     Status: Abnormal   Collection Time: 07/31/2022  5:00 PM  Result Value Ref Range   Prothrombin Time 18.7 (H) 11.4 - 15.2 seconds   INR 1.6 (H) 0.8 - 1.2    Comment: (NOTE) INR goal varies based on device and disease states. Performed at [facility], [address], [place],  [address]   APTT     Status: Abnormal   Collection Time: 08/02/2022  5:00 PM  Result Value Ref Range   aPTT 47 (H) 24 - 36 seconds    Comment:        IF BASELINE aPTT IS ELEVATED, SUGGEST PATIENT RISK ASSESSMENT BE USED TO DETERMINE APPROPRIATE ANTICOAGULANT THERAPY. Performed at [facility], [place], [place] [address]   Troponin I (High Sensitivity)     Status: Abnormal   Collection Time: 08/10/2022  5:00 PM  Result Value Ref Range   Troponin I (High Sensitivity) 157 (HH) <18 ng/L    Comment: CRITICAL RESULT CALLED TO, READ BACK BY AND VERIFIED WITH:  leslie schular, rn 1802 src 11/10 (NOTE) Elevated high sensitivity troponin I (hsTnI) values and significant  changes across serial measurements may suggest ACS but many other  chronic and acute conditions are known  to elevate hsTnI results.  Refer to the Links section for chest pain algorithms and additional  guidance. Performed at [facility], [address] [place], [place] [address]   Lipase, blood     Status: None   Collection Time: 08/09/2022  5:00 PM  Result Value Ref Range   Lipase 27 11 - 51 U/L    Comment: Performed at [facility], [address], [place], [place] [address]  Brain natriuretic peptide     Status: Abnormal   Collection Time: 08/04/2022  5:00 PM  Result Value Ref Range   B Natriuretic Peptide 312.6 (H) 0.0 - 100.0 pg/mL    Comment: Performed at [facility], [address] [place], [place] [address]  Resp Panel by RT-PCR (Flu A&B, Covid) Anterior Nasal Swab     Status: None   Collection Time: 08/20/2022  5:34 PM   Specimen: Anterior Nasal Swab  Result Value Ref Range   SARS Coronavirus 2 by RT PCR NEGATIVE NEGATIVE    Comment: (NOTE) SARS-CoV-2 target nucleic acids are NOT DETECTED.  The SARS-CoV-2 RNA is generally detectable in upper respiratory specimens during the acute phase of infection. The lowest concentration of SARS-CoV-2 viral copies this assay can detect is 138 copies/mL. A negative result does not preclude SARS-Cov-2 infection and should not be used as the sole basis for treatment or other patient management decisions. A negative result may occur with  improper specimen collection/handling, submission of specimen other than nasopharyngeal swab, presence of viral mutation(s) within the areas targeted by this assay, and inadequate number of viral copies(<138 copies/mL). A negative result must be combined with clinical observations, patient history, and epidemiological information. The expected result is Negative.  Fact Sheet for Patients:  EntrepreneurPulse.com.au  Fact Sheet for Healthcare Providers:  IncredibleEmployment.be  This test is no t yet approved or  cleared by the [place] FDA and  has been authorized for detection and/or diagnosis of SARS-CoV-2 by FDA under an Emergency Use Authorization (EUA). This EUA will remain  in effect (meaning this test can be used) for the duration of the COVID-19 declaration under Section 564(b)(1) of the Act, 21 U.S.C.section 360bbb-3(b)(1), unless the authorization is terminated  or revoked sooner.       Influenza A by PCR NEGATIVE NEGATIVE   Influenza B by PCR NEGATIVE NEGATIVE    Comment: (NOTE) The Xpert Xpress SARS-CoV-2/FLU/RSV plus assay is intended as an aid in the diagnosis of influenza from Nasopharyngeal swab specimens and should not be used as a sole basis for treatment. Nasal washings and aspirates are unacceptable for Xpert Xpress SARS-CoV-2/FLU/RSV testing.  Fact Sheet for Patients: EntrepreneurPulse.com.au  Fact Sheet for Healthcare Providers: IncredibleEmployment.be  This test is not yet approved or cleared by the [place] FDA and has been authorized for detection and/or diagnosis of SARS-CoV-2 by FDA under an Emergency Use Authorization (EUA). This EUA will remain in effect (meaning this test can be used) for the duration of the COVID-19 declaration under Section 564(b)(1) of the Act, 21 U.S.C. section 360bbb-3(b)(1), unless the authorization is terminated or revoked.  Performed at [facility], [address], [place], [place] [address]   Lactic acid, plasma     Status: Abnormal   Collection Time: 08/21/2022  6:25 PM  Result Value  Ref Range   Lactic Acid, Venous 10.2 (HH) 0.5 - 1.9 mmol/L    Comment: CRITICAL RESULT CALLED TO, READ BACK BY AND VERIFIED WITH: Johnnette Gourd 1901 08/07/2022 DBRADLEY Performed at [facility], [address], [place], [place] [address]   Ammonia     Status: None   Collection Time: 08/15/2022  6:25 PM  Result Value Ref Range   Ammonia 31 9 - 35 umol/L     Comment: Performed at [facility], [address] [place], [place] [address]  TSH     Status: None   Collection Time: 08/09/2022  6:25 PM  Result Value Ref Range   TSH 2.092 0.350 - 4.500 uIU/mL    Comment: Performed by a 3rd Generation assay with a functional sensitivity of <=0.01 uIU/mL. Performed at [facility], [address], [place], [place] [address]   Troponin I (High Sensitivity)     Status: Abnormal   Collection Time: 08/10/2022  6:25 PM  Result Value Ref Range   Troponin I (High Sensitivity) 184 (HH) <18 ng/L    Comment: CRITICAL VALUE NOTED.  VALUE IS CONSISTENT WITH PREVIOUSLY REPORTED AND CALLED VALUE. (NOTE) Elevated high sensitivity troponin I (hsTnI) values and significant  changes across serial measurements may suggest ACS but many other  chronic and acute conditions are known to elevate hsTnI results.  Refer to the Links section for chest pain algorithms and additional  guidance. Performed at [facility], [address] [place], [place] [address]   I-Stat arterial blood gas, ED     Status: Abnormal   Collection Time: 08/09/2022  8:05 PM  Result Value Ref Range   pH, Arterial 7.319 (L) 7.35 - 7.45   pCO2 arterial 21.4 (L) 32 - 48 mmHg   pO2, Arterial 121 (H) 83 - 108 mmHg   Bicarbonate 10.8 (L) 20.0 - 28.0 mmol/L   TCO2 11 (L) 22 - 32 mmol/L   O2 Saturation 98 %   Acid-base deficit 13.0 (H) 0.0 - 2.0 mmol/L   Sodium 135 135 - 145 mmol/L   Potassium 3.5 3.5 - 5.1 mmol/L   Calcium, Ion 1.10 (L) 1.15 - 1.40 mmol/L   HCT 37.0 36.0 - 46.0 %   Hemoglobin 12.6 12.0 - 15.0 g/dL   Patient temperature 101.8 F    Collection site RADIAL, ALLEN'S TEST ACCEPTABLE    Drawn by RT    Sample type ARTERIAL    Comment NOTIFIED PHYSICIAN   Glucose, capillary     Status: Abnormal   Collection Time: 08/25/2022 11:56 PM  Result Value Ref Range   Glucose-Capillary 55 (L) 70 - 99 mg/dL    Comment: Glucose reference range  applies only to samples taken after fasting for at least 8 hours.  CBC     Status: Abnormal   Collection Time: 08/02/2022 11:57 PM  Result Value Ref Range   WBC 38.5 (H) 4.0 - 10.5 K/uL   RBC 4.14 3.87 - 5.11 MIL/uL   Hemoglobin 13.3 12.0 - 15.0 g/dL   HCT 39.5 36.0 - 46.0 %   MCV 95.4 80.0 - 100.0 fL   MCH 32.1 26.0 - 34.0 pg   MCHC 33.7 30.0 - 36.0 g/dL   RDW 13.6 11.5 - 15.5 %   Platelets 62 (L) 150 - 400 K/uL    Comment: Immature Platelet Fraction may be clinically indicated, consider ordering this additional test [id number] REPEATED TO VERIFY PLATELET COUNT CONFIRMED BY SMEAR    nRBC 0.0 0.0 -  0.2 %    Comment: Performed at [facility], [place] [address] [place], [place] [address]  Creatinine, serum     Status: Abnormal   Collection Time: 08/09/2022 11:57 PM  Result Value Ref Range   Creatinine, Ser 2.71 (H) 0.44 - 1.00 mg/dL   GFR, Estimated 19 (L) >60 mL/min    Comment: (NOTE) Calculated using the CKD-EPI Creatinine Equation (2021) Performed at [facility] [address], [place], [place] [address]   HIV Antibody (routine testing w rflx)     Status: None   Collection Time: 08/02/2022 11:57 PM  Result Value Ref Range   HIV Screen 4th Generation wRfx Non Reactive Non Reactive    Comment: Performed at [facility], [place] [address] [place], [place] [address]  Lactic acid, plasma     Status: Abnormal   Collection Time: 08/18/2022 11:57 PM  Result Value Ref Range   Lactic Acid, Venous 6.1 (HH) 0.5 - 1.9 mmol/L    Comment: CRITICAL RESULT CALLED TO, READ BACK BY AND VERIFIED WITH MINDY ELIAS RN 08/06/22 0035 Wiliam Ke Performed at [facility], [place] [address] [place], [place] [address]   ABO/Rh     Status: None   Collection Time: 08/22/2022 11:57 PM  Result Value Ref Range   ABO/RH(D)      A POS Performed at [facility] [address] [place], [place] [address]   Urinalysis, Routine w reflex microscopic     Status: Abnormal   Collection  Time: 08/06/22 12:20 AM  Result Value Ref Range   Color, Urine AMBER (A) YELLOW    Comment: BIOCHEMICALS MAY BE AFFECTED BY COLOR   APPearance CLOUDY (A) CLEAR   Specific Gravity, Urine 1.016 1.005 - 1.030   pH 5.0 5.0 - 8.0   Glucose, UA NEGATIVE NEGATIVE mg/dL   Hgb urine dipstick LARGE (A) NEGATIVE   Bilirubin Urine NEGATIVE NEGATIVE   Ketones, ur NEGATIVE NEGATIVE mg/dL   Protein, ur 100 (A) NEGATIVE mg/dL   Nitrite NEGATIVE NEGATIVE   Leukocytes,Ua NEGATIVE NEGATIVE   RBC / HPF 0-5 0 - 5 RBC/hpf   WBC, UA 11-20 0 - 5 WBC/hpf   Bacteria, UA MANY (A) NONE SEEN   Squamous Epithelial / LPF 0-5 0 - 5   Mucus PRESENT    Hyaline Casts, UA PRESENT    Granular Casts, UA PRESENT     Comment: Performed at [facility], [address], [place], [place] [address]  Urine Culture     Status: None   Collection Time: 08/06/22 12:20 AM   Specimen: In/Out Cath Urine  Result Value Ref Range   Specimen Description IN/OUT CATH URINE    Special Requests NONE    Culture      NO GROWTH Performed at [facility], [place] [address] [place],  [address]    Report Status 08/07/2022 FINAL   MRSA Next Gen by PCR, Nasal     Status: None   Collection Time: 08/06/22 12:25 AM   Specimen: Nasal Mucosa; Nasal Swab  Result Value Ref Range   MRSA by PCR Next Gen NOT DETECTED NOT DETECTED    Comment: (NOTE) The GeneXpert MRSA Assay (FDA approved for NASAL specimens only), is one component of a comprehensive MRSA colonization surveillance program. It is not intended to diagnose MRSA infection nor to guide or monitor treatment for MRSA infections. Test performance is not FDA approved in patients less than 2  years old. Performed at [facility], [place] [address], [place], [place] [address]   Glucose, capillary     Status: Abnormal   Collection Time: 08/06/22 12:32 AM  Result Value Ref Range   Glucose-Capillary 110 (H) 70 - 99 mg/dL    Comment: Glucose reference range applies only to  samples taken after fasting for at least 8 hours.  Glucose, capillary     Status: Abnormal   Collection Time: 08/06/22  1:24 AM  Result Value Ref Range   Glucose-Capillary 105 (H) 70 - 99 mg/dL    Comment: Glucose reference range applies only to samples taken after fasting for at least 8 hours.  C Difficile Quick Screen w PCR reflex     Status: None   Collection Time: 08/06/22  2:00 AM   Specimen: STOOL  Result Value Ref Range   C Diff antigen NEGATIVE NEGATIVE   C Diff toxin NEGATIVE NEGATIVE   C Diff interpretation No C. difficile detected.     Comment: Performed at [facility], [place] [address] [place], [place] [address]  Protime-INR     Status: Abnormal   Collection Time: 08/06/22  4:55 AM  Result Value Ref Range   Prothrombin Time 31.3 (H) 11.4 - 15.2 seconds   INR 3.1 (H) 0.8 - 1.2    Comment: (NOTE) INR goal varies based on device and disease states. Performed at [facility], [place] [address] [place], [place] [address]   Procalcitonin     Status: None   Collection Time: 08/06/22  4:55 AM  Result Value Ref Range   Procalcitonin >150.00 ng/mL    Comment:        Interpretation: PCT >= 10 ng/mL: Important systemic inflammatory response, almost exclusively due to severe bacterial sepsis or septic shock. (NOTE)       Sepsis PCT Algorithm           Lower Respiratory Tract                                      Infection PCT Algorithm    ----------------------------     ----------------------------         PCT < 0.25 ng/mL                PCT < 0.10 ng/mL          Strongly encourage             Strongly discourage   discontinuation of antibiotics    initiation of antibiotics    ----------------------------     -----------------------------       PCT 0.25 - 0.50 ng/mL            PCT 0.10 - 0.25 ng/mL               OR       >80% decrease in PCT            Discourage initiation of                                            antibiotics      Encourage  discontinuation           of antibiotics    ----------------------------     -----------------------------  PCT >= 0.50 ng/mL              PCT 0.26 - 0.50 ng/mL                AND       <80% decrease in PCT             Encourage initiation of                                             antibiotics       Encourage continuation           of antibiotics    ----------------------------     -----------------------------        PCT >= 0.50 ng/mL                  PCT > 0.50 ng/mL               AND         increase in PCT                  Strongly encourage                                      initiation of antibiotics    Strongly encourage escalation           of antibiotics                                     -----------------------------                                           PCT <= 0.25 ng/mL                                                 OR                                        > 80% decrease in PCT                                      Discontinue / Do not initiate                                             antibiotics  Performed at [facility], [address] [place], [place] [address]   Comprehensive metabolic panel     Status: Abnormal   Collection Time: 08/06/22  4:55 AM  Result Value Ref Range   Sodium 137 135 - 145 mmol/L   Potassium 2.9 (L) 3.5 - 5.1 mmol/L   Chloride 106 98 - 111 mmol/L  CO2 18 (L) 22 - 32 mmol/L   Glucose, Bld 142 (H) 70 - 99 mg/dL    Comment: Glucose reference range applies only to samples taken after fasting for at least 8 hours.   BUN 41 (H) 8 - 23 mg/dL   Creatinine, Ser 3.10 (H) 0.44 - 1.00 mg/dL   Calcium 7.7 (L) 8.9 - 10.3 mg/dL   Total Protein 4.5 (L) 6.5 - 8.1 g/dL   Albumin 2.5 (L) 3.5 - 5.0 g/dL   AST 497 (H) 15 - 41 U/L   ALT 445 (H) 0 - 44 U/L   Alkaline Phosphatase 88 38 - 126 U/L   Total Bilirubin 3.5 (H) 0.3 - 1.2 mg/dL   GFR, Estimated 16 (L) >60 mL/min    Comment: (NOTE) Calculated using the CKD-EPI  Creatinine Equation (2021)    Anion gap 13 5 - 15    Comment: Performed at [facility], [place] [address] [place], [place] [address]  CBC     Status: Abnormal   Collection Time: 08/06/22  4:55 AM  Result Value Ref Range   WBC 49.1 (H) 4.0 - 10.5 K/uL   RBC 4.12 3.87 - 5.11 MIL/uL   Hemoglobin 13.2 12.0 - 15.0 g/dL   HCT 38.6 36.0 - 46.0 %   MCV 93.7 80.0 - 100.0 fL   MCH 32.0 26.0 - 34.0 pg   MCHC 34.2 30.0 - 36.0 g/dL   RDW 13.6 11.5 - 15.5 %   Platelets 37 (L) 150 - 400 K/uL    Comment: Immature Platelet Fraction may be clinically indicated, consider ordering this additional test [id number] REPEATED TO VERIFY PLATELET COUNT CONFIRMED BY SMEAR    nRBC 0.0 0.0 - 0.2 %    Comment: Performed at [facility], [place] [address], [place], [place] [address]  Differential     Status: Abnormal   Collection Time: 08/06/22  4:55 AM  Result Value Ref Range   Neutrophils Relative % 90 %   Neutro Abs 44.1 (H) 1.7 - 7.7 K/uL   Lymphocytes Relative 2 %   Lymphs Abs 0.8 0.7 - 4.0 K/uL   Monocytes Relative 3 %   Monocytes Absolute 1.5 (H) 0.1 - 1.0 K/uL   Eosinophils Relative 0 %   Eosinophils Absolute 0.0 0.0 - 0.5 K/uL   Basophils Relative 0 %   Basophils Absolute 0.0 0.0 - 0.1 K/uL   WBC Morphology      MODERATE LEFT SHIFT (>5% METAS AND MYELOS,OCC PRO NOTED)   RBC Morphology MORPHOLOGY UNREMARKABLE    Smear Review MORPHOLOGY UNREMARKABLE    Immature Granulocytes 5 %   Abs Immature Granulocytes 2.51 (H) 0.00 - 0.07 K/uL    Comment: Performed at [facility] [address] [place], [place] [address]  Lactic acid, plasma     Status: Abnormal   Collection Time: 08/06/22  4:56 AM  Result Value Ref Range   Lactic Acid, Venous 4.7 (HH) 0.5 - 1.9 mmol/L    Comment: CRITICAL VALUE NOTED. VALUE IS CONSISTENT WITH PREVIOUSLY REPORTED/CALLED VALUE Performed at [facility], [place] [address] [place], [place] [address]   Glucose, capillary     Status: Abnormal    Collection Time: 08/06/22  4:58 AM  Result Value Ref Range   Glucose-Capillary 132 (H) 70 - 99 mg/dL    Comment: Glucose reference range applies only to samples taken after fasting for at least 8 hours.  I-STAT 7, (LYTES, BLD GAS, ICA, H+H)  Status: Abnormal   Collection Time: 08/06/22  5:46 AM  Result Value Ref Range   pH, Arterial 7.230 (L) 7.35 - 7.45   pCO2 arterial 43.3 32 - 48 mmHg   pO2, Arterial 147 (H) 83 - 108 mmHg   Bicarbonate 18.1 (L) 20.0 - 28.0 mmol/L   TCO2 19 (L) 22 - 32 mmol/L   O2 Saturation 99 %   Acid-base deficit 9.0 (H) 0.0 - 2.0 mmol/L   Sodium 138 135 - 145 mmol/L   Potassium 3.1 (L) 3.5 - 5.1 mmol/L   Calcium, Ion 1.13 (L) 1.15 - 1.40 mmol/L   HCT 39.0 36.0 - 46.0 %   Hemoglobin 13.3 12.0 - 15.0 g/dL   Sample type ARTERIAL   Glucose, capillary     Status: Abnormal   Collection Time: 08/06/22  8:01 AM  Result Value Ref Range   Glucose-Capillary 56 (L) 70 - 99 mg/dL    Comment: Glucose reference range applies only to samples taken after fasting for at least 8 hours.  Potassium     Status: None   Collection Time: 08/06/22  9:50 AM  Result Value Ref Range   Potassium 4.0 3.5 - 5.1 mmol/L    Comment: Performed at [facility], [place] [address], [place], [place] [address]  DIC Panel ONCE - STAT     Status: Abnormal   Collection Time: 08/06/22  9:53 AM  Result Value Ref Range   Prothrombin Time 35.2 (H) 11.4 - 15.2 seconds   INR 3.5 (H) 0.8 - 1.2    Comment: (NOTE) INR goal varies based on device and disease states.    aPTT 71 (H) 24 - 36 seconds    Comment:        IF BASELINE aPTT IS ELEVATED, SUGGEST PATIENT RISK ASSESSMENT BE USED TO DETERMINE APPROPRIATE ANTICOAGULANT THERAPY.    Fibrinogen 223 210 - 475 mg/dL    Comment: (NOTE) Fibrinogen results may be underestimated in patients receiving thrombolytic therapy.    D-Dimer, Quant >20.00 (H) 0.00 - 0.50 ug/mL-FEU    Comment: (NOTE) At the manufacturer cut-off value of 0.5 g/mL FEU,  this assay has a negative predictive value of 95-100%.This assay is intended for use in conjunction with a clinical pretest probability (PTP) assessment model to exclude pulmonary embolism (PE) and deep venous thrombosis (DVT) in outpatients suspected of PE or DVT. Results should be correlated with clinical presentation.    Platelets 33 (L) 150 - 400 K/uL    Comment: Immature Platelet Fraction may be clinically indicated, consider ordering this additional test [id number] REPEATED TO VERIFY    Smear Review NO SCHISTOCYTES SEEN     Comment: Performed at [facility], [place] [address] [place], [place] [address]  Hemoglobin A1c     Status: None   Collection Time: 08/06/22  9:53 AM  Result Value Ref Range   Hgb A1c MFr Bld 5.5 4.8 - 5.6 %    Comment: (NOTE) Pre diabetes:          5.7%-6.4%  Diabetes:              >6.4%  Glycemic control for   <7.0% adults with diabetes    Mean Plasma Glucose 111.15 mg/dL    Comment: Performed at [facility] [address] [place], [place] [address]  Triglycerides     Status: Abnormal   Collection Time: 08/06/22  9:54 AM  Result Value Ref Range   Triglycerides 178 (H) <150 mg/dL  Comment: Performed at [facility], [place] [address], [place], [place] [address]  Glucose, capillary     Status: None   Collection Time: 08/06/22 11:45 AM  Result Value Ref Range   Glucose-Capillary 97 70 - 99 mg/dL    Comment: Glucose reference range applies only to samples taken after fasting for at least 8 hours.  Ferritin     Status: None   Collection Time: 08/06/22 12:40 PM  Result Value Ref Range   Ferritin 157 11 - 307 ng/mL    Comment: Performed at [facility], [place] [address] [place], [place] [address]  Lactic acid, plasma     Status: Abnormal   Collection Time: 08/06/22 12:42 PM  Result Value Ref Range   Lactic Acid, Venous 4.7 (HH) 0.5 - 1.9 mmol/L    Comment: CRITICAL VALUE NOTED. VALUE IS CONSISTENT WITH PREVIOUSLY  REPORTED/CALLED VALUE Performed at [facility], [place] [address], [place], [place] [address]   CBC     Status: Abnormal   Collection Time: 08/06/22  1:25 PM  Result Value Ref Range   WBC 51.0 (HH) 4.0 - 10.5 K/uL    Comment: REPEATED TO VERIFY THIS CRITICAL RESULT HAS VERIFIED AND BEEN CALLED TO JENNY GARNADOS RN BY MELISSA BOOSTEDT ON 11 11 2023 AT 9528, AND HAS BEEN READ BACK.     RBC 3.89 3.87 - 5.11 MIL/uL   Hemoglobin 12.4 12.0 - 15.0 g/dL   HCT 36.1 36.0 - 46.0 %   MCV 92.8 80.0 - 100.0 fL   MCH 31.9 26.0 - 34.0 pg   MCHC 34.3 30.0 - 36.0 g/dL   RDW 13.9 11.5 - 15.5 %   Platelets 15 (LL) 150 - 400 K/uL    Comment: Immature Platelet Fraction may be clinically indicated, consider ordering this additional test [id number] REPEATED TO VERIFY PLATELET COUNT CONFIRMED BY SMEAR THIS CRITICAL RESULT HAS VERIFIED AND BEEN CALLED TO JENNY GARNADOS RN BY MELISSA BOOSTEDT ON 11 11 2023 AT 1455, AND HAS BEEN READ BACK.     nRBC 0.0 0.0 - 0.2 %    Comment: Performed at [facility], [place] [address], [place], [place] [address]  Comprehensive metabolic panel     Status: Abnormal   Collection Time: 08/06/22  1:25 PM  Result Value Ref Range   Sodium 138 135 - 145 mmol/L   Potassium 4.2 3.5 - 5.1 mmol/L   Chloride 100 98 - 111 mmol/L   CO2 19 (L) 22 - 32 mmol/L   Glucose, Bld 199 (H) 70 - 99 mg/dL    Comment: Glucose reference range applies only to samples taken after fasting for at least 8 hours.   BUN 47 (H) 8 - 23 mg/dL   Creatinine, Ser 3.64 (H) 0.44 - 1.00 mg/dL   Calcium 6.8 (L) 8.9 - 10.3 mg/dL   Total Protein 4.9 (L) 6.5 - 8.1 g/dL   Albumin 2.3 (L) 3.5 - 5.0 g/dL   AST 325 (H) 15 - 41 U/L    Comment: RESULT CONFIRMED BY MANUAL DILUTION   ALT 300 (H) 0 - 44 U/L   Alkaline Phosphatase 73 38 - 126 U/L   Total Bilirubin 3.9 (H) 0.3 - 1.2 mg/dL   GFR, Estimated 13 (L) >60 mL/min    Comment: (NOTE) Calculated using the CKD-EPI Creatinine Equation (2021)    Anion gap 19  (H) 5 - 15    Comment: Performed at [facility], [place] [address],  [place], [place] [address]  I-STAT 7, (LYTES, BLD GAS, ICA, H+H)     Status: Abnormal   Collection Time: 08/06/22  1:44 PM  Result Value Ref Range   pH, Arterial 7.356 7.35 - 7.45   pCO2 arterial 35.7 32 - 48 mmHg   pO2, Arterial 147 (H) 83 - 108 mmHg   Bicarbonate 20.1 20.0 - 28.0 mmol/L   TCO2 21 (L) 22 - 32 mmol/L   O2 Saturation 99 %   Acid-base deficit 5.0 (H) 0.0 - 2.0 mmol/L   Sodium 134 (L) 135 - 145 mmol/L   Potassium 3.9 3.5 - 5.1 mmol/L   Calcium, Ion 0.96 (L) 1.15 - 1.40 mmol/L   HCT 36.0 36.0 - 46.0 %   Hemoglobin 12.2 12.0 - 15.0 g/dL   Patient temperature 97.9 F    Collection site [employer] by [facility]    Sample type ARTERIAL   Lactic acid, plasma     Status: Abnormal   Collection Time: 08/06/22  1:59 PM  Result Value Ref Range   Lactic Acid, Venous 4.4 (HH) 0.5 - 1.9 mmol/L    Comment: CRITICAL VALUE NOTED. VALUE IS CONSISTENT WITH PREVIOUSLY REPORTED/CALLED VALUE Performed at [facility], [place] [address] [place], [place] [address]   Prepare platelet pheresis     Status: None   Collection Time: 08/06/22  2:57 PM  Result Value Ref Range   Unit Number [id number]    Blood Component Type PLTP3 PSORALEN TREATED    Unit division 00    Status of Unit ISSUED,FINAL    Transfusion Status      OK TO TRANSFUSE Performed at [facility] [address], [place], [place] [address]   Glucose, capillary     Status: Abnormal   Collection Time: 08/06/22  3:35 PM  Result Value Ref Range   Glucose-Capillary 109 (H) 70 - 99 mg/dL    Comment: Glucose reference range applies only to samples taken after fasting for at least 8 hours.  Vancomycin, random     Status: None   Collection Time: 08/06/22  6:40 PM  Result Value Ref Range   Vancomycin Rm 17 ug/mL    Comment:        Random Vancomycin therapeutic range is dependent on dosage and time of specimen collection. A peak range is  20-40 ug/mL A trough range is 5-15 ug/mL        Performed at [facility] [address], [place], [place] [address]   Glucose, capillary     Status: Abnormal   Collection Time: 08/06/22  7:34 PM  Result Value Ref Range   Glucose-Capillary 194 (H) 70 - 99 mg/dL    Comment: Glucose reference range applies only to samples taken after fasting for at least 8 hours.  Troponin I (High Sensitivity)     Status: Abnormal   Collection Time: 08/06/22  8:08 PM  Result Value Ref Range   Troponin I (High Sensitivity) >24,000 (HH) <18 ng/L    Comment: CRITICAL RESULT CALLED TO, READ BACK BY AND VERIFIED WITH: G,RAMIREZ RN '@2054'$  08/06/22 E,BENTON (NOTE) Elevated high sensitivity troponin I (hsTnI) values and significant  changes across serial measurements may suggest ACS but many other  chronic and acute conditions are known to elevate hsTnI results.  Refer to the "Links" section for chest pain algorithms and additional  guidance.  Due to the decreased utility in serial monitoring of hsTroponin once the value exceeds 24,000 ng/L,  orders for the next 24 hours will be discontinued. Confirmation testing may be ordered. HS Troponin lab(s) may be reordered after 24 hours. Performed at [facility], [place] [address], [place], [place] [address]   Lactic acid, plasma     Status: Abnormal   Collection Time: 08/06/22  8:08 PM  Result Value Ref Range   Lactic Acid, Venous 5.8 (HH) 0.5 - 1.9 mmol/L    Comment: CRITICAL VALUE NOTED. VALUE IS CONSISTENT WITH PREVIOUSLY REPORTED/CALLED VALUE Performed at [facility], [place] [address] [place], [place] [address]   Lactic acid, plasma     Status: Abnormal   Collection Time: 08/06/22 10:42 PM  Result Value Ref Range   Lactic Acid, Venous 5.6 (HH) 0.5 - 1.9 mmol/L    Comment: CRITICAL VALUE NOTED. VALUE IS CONSISTENT WITH PREVIOUSLY REPORTED/CALLED VALUE Performed at [facility], [place] [address] [place], [place] [address]   Glucose,  capillary     Status: Abnormal   Collection Time: 08/06/22 11:13 PM  Result Value Ref Range   Glucose-Capillary 186 (H) 70 - 99 mg/dL    Comment: Glucose reference range applies only to samples taken after fasting for at least 8 hours.  Protime-INR     Status: Abnormal   Collection Time: 08/07/22  3:22 AM  Result Value Ref Range   Prothrombin Time 28.1 (H) 11.4 - 15.2 seconds   INR 2.7 (H) 0.8 - 1.2    Comment: (NOTE) INR goal varies based on device and disease states. Performed at [facility], [place] [address], [place], [place] [address]   Comprehensive metabolic panel     Status: Abnormal   Collection Time: 08/07/22  3:22 AM  Result Value Ref Range   Sodium 135 135 - 145 mmol/L   Potassium 5.0 3.5 - 5.1 mmol/L   Chloride 95 (L) 98 - 111 mmol/L   CO2 23 22 - 32 mmol/L   Glucose, Bld 181 (H) 70 - 99 mg/dL    Comment: Glucose reference range applies only to samples taken after fasting for at least 8 hours.   BUN 54 (H) 8 - 23 mg/dL   Creatinine, Ser 4.34 (H) 0.44 - 1.00 mg/dL   Calcium 5.7 (LL) 8.9 - 10.3 mg/dL    Comment: CRITICAL RESULT CALLED TO, READ BACK BY AND VERIFIED WITH MICHELLE TOLER RN 08/07/22 0430 M KOROLESKI   Total Protein 4.5 (L) 6.5 - 8.1 g/dL   Albumin 1.9 (L) 3.5 - 5.0 g/dL   AST 355 (H) 15 - 41 U/L   ALT 238 (H) 0 - 44 U/L   Alkaline Phosphatase 59 38 - 126 U/L   Total Bilirubin 3.5 (H) 0.3 - 1.2 mg/dL   GFR, Estimated 11 (L) >60 mL/min    Comment: (NOTE) Calculated using the CKD-EPI Creatinine Equation (2021)    Anion gap 17 (H) 5 - 15    Comment: Performed at [facility], [place] [address], [place], [place] [address]  CBC     Status: Abnormal   Collection Time: 08/07/22  3:22 AM  Result Value Ref Range   WBC 54.9 (HH) 4.0 - 10.5 K/uL    Comment: CRITICAL VALUE NOTED.  VALUE IS CONSISTENT WITH PREVIOUSLY REPORTED AND CALLED VALUE. REPEATED TO VERIFY    RBC 3.66 (L) 3.87 - 5.11 MIL/uL   Hemoglobin 11.5 (L) 12.0 - 15.0 g/dL   HCT 33.2 (L)  36.0 - 46.0 %   MCV 90.7 80.0 - 100.0  fL   MCH 31.4 26.0 - 34.0 pg   MCHC 34.6 30.0 - 36.0 g/dL   RDW 13.9 11.5 - 15.5 %   Platelets 10 (LL) 150 - 400 K/uL    Comment: Immature Platelet Fraction may be clinically indicated, consider ordering this additional test [id number] CRITICAL VALUE NOTED.  VALUE IS CONSISTENT WITH PREVIOUSLY REPORTED AND CALLED VALUE. REPEATED TO VERIFY    nRBC 0.0 0.0 - 0.2 %    Comment: Performed at [facility], [place] [address], [place], [place] [address]  Magnesium     Status: Abnormal   Collection Time: 08/07/22  3:22 AM  Result Value Ref Range   Magnesium 1.3 (L) 1.7 - 2.4 mg/dL    Comment: Performed at [facility] [address], [place], [place] [address]  Phosphorus     Status: Abnormal   Collection Time: 08/07/22  3:22 AM  Result Value Ref Range   Phosphorus 6.5 (H) 2.5 - 4.6 mg/dL    Comment: Performed at [facility] [address], [place], [place] [address]  Triglycerides     Status: Abnormal   Collection Time: 08/07/22  3:22 AM  Result Value Ref Range   Triglycerides 224 (H) <150 mg/dL    Comment: Performed at [facility] [address] [place], [place] [address]  Glucose, capillary     Status: Abnormal   Collection Time: 08/07/22  3:35 AM  Result Value Ref Range   Glucose-Capillary 169 (H) 70 - 99 mg/dL    Comment: Glucose reference range applies only to samples taken after fasting for at least 8 hours.  Glucose, capillary     Status: Abnormal   Collection Time: 08/07/22  7:45 AM  Result Value Ref Range   Glucose-Capillary 127 (H) 70 - 99 mg/dL    Comment: Glucose reference range applies only to samples taken after fasting for at least 8 hours.    ECG   Sinus rhythm with 2 mm ST elevation in I, AVL - Personally Reviewed  Telemetry   Sinus rhythm - Personally Reviewed  Radiology    ECHOCARDIOGRAM COMPLETE  Result Date: 08/07/2022    ECHOCARDIOGRAM REPORT   Patient Name:   PEIGHTYN ROBERSON Date of  Exam: 08/07/2022 Medical Rec #:  [id number]      Height:       65.0 in Accession #:    [id number]     Weight:       164.0 lb Date of Birth:  [birth year]     BSA:          1.818 m Patient Age:    89 years       BP:           94/55 mmHg Patient Gender: F              HR:           81 bpm. Exam Location:  Inpatient Procedure: 2D Echo, Cardiac Doppler, Color Doppler and Intracardiac            Opacification Agent STAT ECHO Indications:    Shock ([place]) [[id number]]  History:        Patient has no prior history of Echocardiogram examinations.                 Previous Myocardial Infarction.  Sonographer:    Bernadene Person RDCS Referring Phys: [id number] [place]  1. Left ventricular ejection fraction, by estimation, is 40 to  45%. Left ventricular ejection fraction by 2D MOD biplane is 42.6 %. The left ventricle has mildly decreased function. The left ventricle demonstrates global hypokinesis. Left ventricular diastolic parameters are consistent with Grade I diastolic dysfunction (impaired relaxation).  2. Right ventricular systolic function is moderately reduced. The right ventricular size is mildly enlarged. There is normal pulmonary artery systolic pressure. The estimated right ventricular systolic pressure is 00.7 mmHg.  3. Left atrial size was mildly dilated.  4. The mitral valve is abnormal. Trivial mitral valve regurgitation.  5. The aortic valve is tricuspid. Aortic valve regurgitation is not visualized. Comparison(s): No prior Echocardiogram. Conclusion(s)/Recommendation(s): Critical findings reported to Dr. Erskine Emery and acknowledged at 8:56 am. FINDINGS  Left Ventricle: Left ventricular ejection fraction, by estimation, is 40 to 45%. Left ventricular ejection fraction by 2D MOD biplane is 42.6 %. The left ventricle has mildly decreased function. The left ventricle demonstrates global hypokinesis. Definity contrast agent was given IV to delineate the left ventricular endocardial borders. The left  ventricular internal cavity size was normal in size. There is no left ventricular hypertrophy. Left ventricular diastolic parameters are consistent with Grade I diastolic dysfunction (impaired relaxation). Right Ventricle: The right ventricular size is mildly enlarged. No increase in right ventricular wall thickness. Right ventricular systolic function is moderately reduced. There is normal pulmonary artery systolic pressure. The tricuspid regurgitant velocity is 1.87 m/s, and with an assumed right atrial pressure of 15 mmHg, the estimated right ventricular systolic pressure is 62.2 mmHg. Left Atrium: Left atrial size was mildly dilated. Right Atrium: Right atrial size was normal in size. Pericardium: There is no evidence of pericardial effusion. Mitral Valve: The mitral valve is abnormal. There is mild thickening of the mitral valve leaflet(s). Trivial mitral valve regurgitation. Tricuspid Valve: The tricuspid valve is grossly normal. Tricuspid valve regurgitation is trivial. Aortic Valve: The aortic valve is tricuspid. Aortic valve regurgitation is not visualized. Pulmonic Valve: The pulmonic valve was not well visualized. Pulmonic valve regurgitation is not visualized. Aorta: The aortic root and ascending aorta are structurally normal, with no evidence of dilitation. Venous: IVC assessment for right atrial pressure unable to be performed due to mechanical ventilation. IAS/Shunts: No atrial level shunt detected by color flow Doppler.  LEFT VENTRICLE PLAX 2D                        Biplane EF (MOD) LVIDd:         4.40 cm         LV Biplane EF:   Left LVIDs:         3.50 cm                          ventricular LV PW:         0.90 cm                          ejection LV IVS:        0.70 cm                          fraction by LVOT diam:     2.10 cm                          2D MOD LV SV:         50  biplane is LV SV Index:   27                               42.6 %. LVOT Area:     3.46 cm                                 Diastology                                LV e' medial:    7.20 cm/s LV Volumes (MOD)               LV E/e' medial:  8.9 LV vol d, MOD    110.0 ml      LV e' lateral:   8.40 cm/s A2C:                           LV E/e' lateral: 7.7 LV vol d, MOD    90.8 ml A4C: LV vol s, MOD    63.4 ml A2C: LV vol s, MOD    53.9 ml A4C: LV SV MOD A2C:   46.6 ml LV SV MOD A4C:   90.8 ml LV SV MOD BP:    44.1 ml RIGHT VENTRICLE RV S prime:     10.40 cm/s TAPSE (M-mode): 1.6 cm LEFT ATRIUM             Index        RIGHT ATRIUM           Index LA diam:        3.70 cm 2.04 cm/m   RA Area:     18.20 cm LA Vol (A2C):   60.3 ml 33.17 ml/m  RA Volume:   45.50 ml  25.03 ml/m LA Vol (A4C):   69.2 ml 38.06 ml/m LA Biplane Vol: 65.6 ml 36.08 ml/m  AORTIC VALVE LVOT Vmax:   82.70 cm/s LVOT Vmean:  57.700 cm/s LVOT VTI:    0.144 m  AORTA Ao Root diam: 3.20 cm Ao Asc diam:  3.30 cm MITRAL VALVE               TRICUSPID VALVE MV Area (PHT): 3.42 cm    TR Peak grad:   14.0 mmHg MV Decel Time: 222 msec    TR Vmax:        187.00 cm/s MV E velocity: 64.30 cm/s MV A velocity: 31.20 cm/s  SHUNTS MV E/A ratio:  2.06        Systemic VTI:  0.14 m                            Systemic Diam: 2.10 cm Lyman Bishop MD Electronically signed by Lyman Bishop MD Signature Date/Time: 08/07/2022/9:05:38 AM    Final    DG Abd 1 View  Result Date: 08/06/2022 CLINICAL DATA:  71 year old female intubated, enteric tube placement. EXAM: ABDOMEN - 1 VIEW COMPARISON:  Portable chest 0552 hours today. FINDINGS: Portable AP semi upright view at 0553 hours. Enteric tube looped in the stomach which is gas distended. Increased gas-filled proximal small bowel loops also when compared to the CT Chest, Abdomen, and Pelvis yesterday. Patchy and confluent retrocardiac opacity at the left lung base. IMPRESSION: 1.  Satisfactory enteric tube placement into the stomach which is gas distended along with some proximal small bowel. Consider NG tube to  suction. 2. Left lower lobe collapse or consolidation. Electronically Signed   By: Genevie Ann M.D.   On: 08/06/2022 06:41   DG Chest 1 View  Result Date: 08/06/2022 CLINICAL DATA:  71 year old female intubated, enteric tube placement. EXAM: CHEST  1 VIEW COMPARISON:  0105 hours portable chest today. FINDINGS: Portable AP semi upright view at 0552 hours. Trachea is located to the right of midline as demonstrated by chest CT yesterday. Endotracheal tube does project over the tracheal air column and tip is above the carina. Enteric tube courses to the abdomen and is looped in the left upper quadrant. There is moderate to severe gas distension of the stomach. Left subclavian central line is stable. Stable somewhat low lung volumes. Increased retrocardiac opacity which now resembles consolidation, largely new from the chest CT yesterday. No pneumothorax or pulmonary edema. No definite pleural effusion. No acute osseous abnormality identified. IMPRESSION: 1. Satisfactory ET tube and enteric tube placement. Gas distended stomach, consider NG tube to suction. 2. Increasing left lower lobe consolidation. Electronically Signed   By: Genevie Ann M.D.   On: 08/06/2022 06:40   DG CHEST PORT 1 VIEW  Result Date: 08/06/2022 CLINICAL DATA:  [id number] Encounter for central line placement [id number] EXAM: PORTABLE CHEST 1 VIEW COMPARISON:  CT chest 08/06/2022. chest x-ray 08/16/2022 FINDINGS: Interval placement of a left subclavian access central venous catheter with tip overlying the expected region the superior cavoatrial junction. The heart and mediastinal contours are unremarkable given AP portable technique. Retrocardiac airspace opacity likely atelectasis. Mildly increased interstitial markings. No pleural effusion. No pneumothorax. No acute osseous abnormality. IMPRESSION: 1. Mild pulmonary edema. 2. Retrocardiac airspace opacity likely atelectasis. 3. Interval placement of a left subclavian venous catheter with tip overlying the  expected region the superior cavoatrial junction. Electronically Signed   By: Iven Finn M.D.   On: 08/06/2022 01:47   CT CHEST ABDOMEN PELVIS WO CONTRAST  Result Date: 07/28/2022 CLINICAL DATA:  Sepsis EXAM: CT CHEST, ABDOMEN AND PELVIS WITHOUT CONTRAST TECHNIQUE: Multidetector CT imaging of the chest, abdomen and pelvis was performed following the standard protocol without IV contrast. RADIATION DOSE REDUCTION: This exam was performed according to the departmental dose-optimization program which includes automated exposure control, adjustment of the mA and/or kV according to patient size and/or use of iterative reconstruction technique. COMPARISON:  CT 12/31/2019 FINDINGS: CT CHEST FINDINGS Cardiovascular: Limited evaluation without intravenous contrast. Mild atherosclerosis. No aneurysm. Normal cardiac size. Trace pericardial effusion Mediastinum/Nodes: Midline trachea. No thyroid mass. No suspicious lymph nodes. Esophagus within normal limits. Lungs/Pleura: No consolidation or pleural effusion. Mild dependent atelectasis. Musculoskeletal: Sternum is intact.  No acute osseous abnormality. CT ABDOMEN PELVIS FINDINGS Hepatobiliary: No focal liver abnormality is seen. No gallstones, gallbladder wall thickening, or biliary dilatation. Pancreas: Unremarkable. No pancreatic ductal dilatation or surrounding inflammatory changes. Spleen: Splenectomy Adrenals/Urinary Tract: Adrenal glands are within normal limits. Hazy bilateral perinephric fat stranding and fat stranding at the renal pelvises left greater than right. No hydronephrosis. No obstructing stone. The bladder is unremarkable Stomach/Bowel: The stomach is nonenlarged. No dilated small bowel. Collapsed appearance of the colon, no inflammation. Mild diverticular disease of the left colon. Appendix contains small stone but is otherwise normal in appearance. Vascular/Lymphatic: Mild atherosclerosis. No aneurysm. No suspicious lymph nodes Reproductive:  Uterus and bilateral adnexa are unremarkable. Other: Negative for pelvic effusion or free air. Periumbilical ventral hernia  now containing fat and loop of small bowel but no obstruction or incarceration. Musculoskeletal: No acute osseous abnormality. IMPRESSION: 1. No CT evidence for acute intrathoracic abnormality. 2. Hazy bilateral perinephric fat stranding and fat stranding at the renal pelvises left greater than right, findings are suggestive upper urinary tract infection/pyelonephritis. No hydronephrosis or obstructing stone. Correlate with urinalysis 3. Periumbilical ventral hernia now containing fat and loop of small bowel but no obstruction or incarceration. 4. Mild diverticular disease of the left colon without acute inflammatory process. Electronically Signed   By: Donavan Foil M.D.   On: 08/04/2022 20:00   CT Head Wo Contrast  Result Date: 08/20/2022 CLINICAL DATA:  Delirium. EXAM: CT HEAD WITHOUT CONTRAST TECHNIQUE: Contiguous axial images were obtained from the base of the skull through the vertex without intravenous contrast. RADIATION DOSE REDUCTION: This exam was performed according to the departmental dose-optimization program which includes automated exposure control, adjustment of the mA and/or kV according to patient size and/or use of iterative reconstruction technique. COMPARISON:  None Available. FINDINGS: Brain: The ventricles and sulci are appropriate size for the patient's age. The gray-white matter discrimination is preserved. There is no acute intracranial hemorrhage. No mass effect or midline shift. No extra-axial fluid collection. Vascular: No hyperdense vessel or unexpected calcification. Skull: Normal. Negative for fracture or focal lesion. Sinuses/Orbits: No acute finding. Other: None IMPRESSION: No acute intracranial pathology. Electronically Signed   By: Anner Crete M.D.   On: 08/16/2022 19:53   DG Chest Port 1 View  Result Date: 08/13/2022 CLINICAL DATA:  Possible  sepsis EXAM: PORTABLE CHEST 1 VIEW COMPARISON:  10/09/2018 FINDINGS: The heart size and mediastinal contours are within normal limits. Both lungs are clear. The visualized skeletal structures are unremarkable. Minimal atelectasis at the lingula and left base. IMPRESSION: No active disease. Minimal atelectasis at the lingula and left base. Electronically Signed   By: Donavan Foil M.D.   On: 08/19/2022 17:29    Cardiac Studies   See echo  Assessment   Principal Problem:   Septic shock [facility]) Active Problems:   Shock liver   NSTEMI (non-ST elevated myocardial infarction) ([place])   Plan   S/p lateral wall infarct, probably LCX or OM branch - very high troponins. No options for intervention. Updated family at the bedside. Condition is deteriorating quickly secondary to sepsis- plans for comfort measures. Cardiology will be available as needed.  Time Spent Directly with Patient:  I have spent a total of 25 minutes with the patient reviewing hospital notes, telemetry, EKGs, labs and examining the patient as well as establishing an assessment and plan that was discussed personally with the patient.  > 50% of time was spent in direct patient care.  Length of Stay:  LOS: 2 days   Pixie Casino, MD, [facility], [place] Director of the Advanced Lipid Disorders &  Cardiovascular Risk Reduction Clinic Diplomate of the American Board of Clinical Lipidology Attending Cardiologist  Direct Dial: [phone]  Fax: [phone]  Website:  [email or web address] Yandel Zeiner 08/07/2022, 11:46 AM

## 2022-08-07 NOTE — Progress Notes (Incomplete)
{  Select Note:[id number]} 

## 2022-08-07 NOTE — Progress Notes (Deleted)
[facility] for Infectious Disease    Date of Admission:  08/10/2022   Total days of antibiotics 3           ID: Michelle Cohen is a 71 y.o. female with  hx of lymphoma s/p rituxan s/p splenectomy in 2019 admitted for severe sepsis for gram negative bacteremia Principal Problem:   Septic shock ([place]) Active Problems:   Shock liver   NSTEMI (non-ST elevated myocardial infarction) ([place])    Subjective: Still on 2 vasopressors, remains febrile up to 101F. Cardiology saw patient last night for NSTEMI.   Labs: show worsening lactic acidosis, aki, LFT still elevated/slightly improved, plt decreased to 10K, tbili 3.5, inr improved at 2.7 down from 3.5, wbc up to 54K.  Micro lab still awaiting enough growth on plate to identify. ? Anaerobic growth.  Medications:   sodium chloride   Intravenous Once   Chlorhexidine Gluconate Cloth  6 each Topical Daily   docusate  100 mg Per Tube BID   hydrocortisone sod succinate (SOLU-CORTEF) inj  100 mg Intravenous Q12H   insulin aspart  0-9 Units Subcutaneous Q4H   mouth rinse  15 mL Mouth Rinse Q2H   pantoprazole (PROTONIX) IV  40 mg Intravenous Daily   polyethylene glycol  17 g Per Tube Daily   sodium bicarbonate  100 mEq Intravenous Once   sodium chloride flush  10-40 mL Intracatheter Q12H    Objective: Vital signs in last 24 hours: Temp:  [96.8 F (36 C)-101.7 F (38.7 C)] 101.7 F (38.7 C) (11/12 0700) Pulse Rate:  [77-202] 199 (11/12 0800) Resp:  [15-41] 27 (11/12 0830) BP: (61-94)/(26-67) 92/67 (11/12 0800) SpO2:  [81 %-100 %] 99 % (11/12 0800) Arterial Line BP: (86-122)/(57-75) 94/62 (11/12 0830) FiO2 (%):  [40 %-60 %] 40 % (11/12 0400)     Lab Results Recent Labs    08/06/22 1325 08/06/22 1344 08/07/22 0322  WBC 51.0*  --  54.9*  HGB 12.4 12.2 11.5*  HCT 36.1 36.0 33.2*  NA 138 134* 135  K 4.2 3.9 5.0  CL 100  --  95*  CO2 19*  --  23  BUN 47*  --  54*  CREATININE 3.64*  --  4.34*   Liver Panel Recent Labs     08/06/22 1325 08/07/22 0322  PROT 4.9* 4.5*  ALBUMIN 2.3* 1.9*  AST 325* 355*  ALT 300* 238*  ALKPHOS 73 59  BILITOT 3.9* 3.5*   Sedimentation Rate No results for input(s): "ESRSEDRATE" in the last 72 hours. C-Reactive Protein No results for input(s): "CRP" in the last 72 hours.  Microbiology:  Studies/Results: ECHOCARDIOGRAM COMPLETE  Result Date: 08/07/2022    ECHOCARDIOGRAM REPORT   Patient Name:   Michelle Cohen Date of Exam: 08/07/2022 Medical Rec #:  [id number]      Height:       65.0 in Accession #:    [id number]     Weight:       164.0 lb Date of Birth:  05-Aug-1951     BSA:          1.818 m Patient Age:    79 years       BP:           94/55 mmHg Patient Gender: F              HR:           81 bpm. Exam Location:  Inpatient Procedure: 2D Echo, Cardiac Doppler, Color Doppler and  Intracardiac            Opacification Agent STAT ECHO Indications:    [facility]) [[id number]]  History:        Patient has no prior history of Echocardiogram examinations.                 Previous Myocardial Infarction.  Sonographer:    Bernadene Person RDCS Referring Phys: [id number] [place]  1. Left ventricular ejection fraction, by estimation, is 40 to 45%. Left ventricular ejection fraction by 2D MOD biplane is 42.6 %. The left ventricle has mildly decreased function. The left ventricle demonstrates global hypokinesis. Left ventricular diastolic parameters are consistent with Grade I diastolic dysfunction (impaired relaxation).  2. Right ventricular systolic function is moderately reduced. The right ventricular size is mildly enlarged. There is normal pulmonary artery systolic pressure. The estimated right ventricular systolic pressure is 46.2 mmHg.  3. Left atrial size was mildly dilated.  4. The mitral valve is abnormal. Trivial mitral valve regurgitation.  5. The aortic valve is tricuspid. Aortic valve regurgitation is not visualized. Comparison(s): No prior Echocardiogram.  Conclusion(s)/Recommendation(s): Critical findings reported to Dr. Erskine Emery and acknowledged at 8:56 am. FINDINGS  Left Ventricle: Left ventricular ejection fraction, by estimation, is 40 to 45%. Left ventricular ejection fraction by 2D MOD biplane is 42.6 %. The left ventricle has mildly decreased function. The left ventricle demonstrates global hypokinesis. Definity contrast agent was given IV to delineate the left ventricular endocardial borders. The left ventricular internal cavity size was normal in size. There is no left ventricular hypertrophy. Left ventricular diastolic parameters are consistent with Grade I diastolic dysfunction (impaired relaxation). Right Ventricle: The right ventricular size is mildly enlarged. No increase in right ventricular wall thickness. Right ventricular systolic function is moderately reduced. There is normal pulmonary artery systolic pressure. The tricuspid regurgitant velocity is 1.87 m/s, and with an assumed right atrial pressure of 15 mmHg, the estimated right ventricular systolic pressure is 70.3 mmHg. Left Atrium: Left atrial size was mildly dilated. Right Atrium: Right atrial size was normal in size. Pericardium: There is no evidence of pericardial effusion. Mitral Valve: The mitral valve is abnormal. There is mild thickening of the mitral valve leaflet(s). Trivial mitral valve regurgitation. Tricuspid Valve: The tricuspid valve is grossly normal. Tricuspid valve regurgitation is trivial. Aortic Valve: The aortic valve is tricuspid. Aortic valve regurgitation is not visualized. Pulmonic Valve: The pulmonic valve was not well visualized. Pulmonic valve regurgitation is not visualized. Aorta: The aortic root and ascending aorta are structurally normal, with no evidence of dilitation. Venous: IVC assessment for right atrial pressure unable to be performed due to mechanical ventilation. IAS/Shunts: No atrial level shunt detected by color flow Doppler.  LEFT VENTRICLE PLAX 2D                         Biplane EF (MOD) LVIDd:         4.40 cm         LV Biplane EF:   Left LVIDs:         3.50 cm                          ventricular LV PW:         0.90 cm                          ejection LV  IVS:        0.70 cm                          fraction by LVOT diam:     2.10 cm                          2D MOD LV SV:         50                               biplane is LV SV Index:   27                               42.6 %. LVOT Area:     3.46 cm                                Diastology                                LV e' medial:    7.20 cm/s LV Volumes (MOD)               LV E/e' medial:  8.9 LV vol d, MOD    110.0 ml      LV e' lateral:   8.40 cm/s A2C:                           LV E/e' lateral: 7.7 LV vol d, MOD    90.8 ml A4C: LV vol s, MOD    63.4 ml A2C: LV vol s, MOD    53.9 ml A4C: LV SV MOD A2C:   46.6 ml LV SV MOD A4C:   90.8 ml LV SV MOD BP:    44.1 ml RIGHT VENTRICLE RV S prime:     10.40 cm/s TAPSE (M-mode): 1.6 cm LEFT ATRIUM             Index        RIGHT ATRIUM           Index LA diam:        3.70 cm 2.04 cm/m   RA Area:     18.20 cm LA Vol (A2C):   60.3 ml 33.17 ml/m  RA Volume:   45.50 ml  25.03 ml/m LA Vol (A4C):   69.2 ml 38.06 ml/m LA Biplane Vol: 65.6 ml 36.08 ml/m  AORTIC VALVE LVOT Vmax:   82.70 cm/s LVOT Vmean:  57.700 cm/s LVOT VTI:    0.144 m  AORTA Ao Root diam: 3.20 cm Ao Asc diam:  3.30 cm MITRAL VALVE               TRICUSPID VALVE MV Area (PHT): 3.42 cm    TR Peak grad:   14.0 mmHg MV Decel Time: 222 msec    TR Vmax:        187.00 cm/s MV E velocity: 64.30 cm/s MV A velocity: 31.20 cm/s  SHUNTS MV E/A ratio:  2.06        Systemic VTI:  0.14 m  Systemic Diam: 2.10 cm Lyman Bishop MD Electronically signed by Lyman Bishop MD Signature Date/Time: 08/07/2022/9:05:38 AM    Final    DG Abd 1 View  Result Date: 08/06/2022 CLINICAL DATA:  71 year old female intubated, enteric tube placement. EXAM: ABDOMEN - 1 VIEW COMPARISON:  Portable chest  0552 hours today. FINDINGS: Portable AP semi upright view at 0553 hours. Enteric tube looped in the stomach which is gas distended. Increased gas-filled proximal small bowel loops also when compared to the CT Chest, Abdomen, and Pelvis yesterday. Patchy and confluent retrocardiac opacity at the left lung base. IMPRESSION: 1. Satisfactory enteric tube placement into the stomach which is gas distended along with some proximal small bowel. Consider NG tube to suction. 2. Left lower lobe collapse or consolidation. Electronically Signed   By: Genevie Ann M.D.   On: 08/06/2022 06:41   DG Chest 1 View  Result Date: 08/06/2022 CLINICAL DATA:  71 year old female intubated, enteric tube placement. EXAM: CHEST  1 VIEW COMPARISON:  0105 hours portable chest today. FINDINGS: Portable AP semi upright view at 0552 hours. Trachea is located to the right of midline as demonstrated by chest CT yesterday. Endotracheal tube does project over the tracheal air column and tip is above the carina. Enteric tube courses to the abdomen and is looped in the left upper quadrant. There is moderate to severe gas distension of the stomach. Left subclavian central line is stable. Stable somewhat low lung volumes. Increased retrocardiac opacity which now resembles consolidation, largely new from the chest CT yesterday. No pneumothorax or pulmonary edema. No definite pleural effusion. No acute osseous abnormality identified. IMPRESSION: 1. Satisfactory ET tube and enteric tube placement. Gas distended stomach, consider NG tube to suction. 2. Increasing left lower lobe consolidation. Electronically Signed   By: Genevie Ann M.D.   On: 08/06/2022 06:40   DG CHEST PORT 1 VIEW  Result Date: 08/06/2022 CLINICAL DATA:  [id number] Encounter for central line placement [id number] EXAM: PORTABLE CHEST 1 VIEW COMPARISON:  CT chest 07/27/2022. chest x-ray 08/19/2022 FINDINGS: Interval placement of a left subclavian access central venous catheter with tip overlying the  expected region the superior cavoatrial junction. The heart and mediastinal contours are unremarkable given AP portable technique. Retrocardiac airspace opacity likely atelectasis. Mildly increased interstitial markings. No pleural effusion. No pneumothorax. No acute osseous abnormality. IMPRESSION: 1. Mild pulmonary edema. 2. Retrocardiac airspace opacity likely atelectasis. 3. Interval placement of a left subclavian venous catheter with tip overlying the expected region the superior cavoatrial junction. Electronically Signed   By: Iven Finn M.D.   On: 08/06/2022 01:47   CT CHEST ABDOMEN PELVIS WO CONTRAST  Result Date: 08/09/2022 CLINICAL DATA:  Sepsis EXAM: CT CHEST, ABDOMEN AND PELVIS WITHOUT CONTRAST TECHNIQUE: Multidetector CT imaging of the chest, abdomen and pelvis was performed following the standard protocol without IV contrast. RADIATION DOSE REDUCTION: This exam was performed according to the departmental dose-optimization program which includes automated exposure control, adjustment of the mA and/or kV according to patient size and/or use of iterative reconstruction technique. COMPARISON:  CT 12/31/2019 FINDINGS: CT CHEST FINDINGS Cardiovascular: Limited evaluation without intravenous contrast. Mild atherosclerosis. No aneurysm. Normal cardiac size. Trace pericardial effusion Mediastinum/Nodes: Midline trachea. No thyroid mass. No suspicious lymph nodes. Esophagus within normal limits. Lungs/Pleura: No consolidation or pleural effusion. Mild dependent atelectasis. Musculoskeletal: Sternum is intact.  No acute osseous abnormality. CT ABDOMEN PELVIS FINDINGS Hepatobiliary: No focal liver abnormality is seen. No gallstones, gallbladder wall thickening, or biliary dilatation. Pancreas: Unremarkable. No pancreatic ductal  dilatation or surrounding inflammatory changes. Spleen: Splenectomy Adrenals/Urinary Tract: Adrenal glands are within normal limits. Hazy bilateral perinephric fat stranding and  fat stranding at the renal pelvises left greater than right. No hydronephrosis. No obstructing stone. The bladder is unremarkable Stomach/Bowel: The stomach is nonenlarged. No dilated small bowel. Collapsed appearance of the colon, no inflammation. Mild diverticular disease of the left colon. Appendix contains small stone but is otherwise normal in appearance. Vascular/Lymphatic: Mild atherosclerosis. No aneurysm. No suspicious lymph nodes Reproductive: Uterus and bilateral adnexa are unremarkable. Other: Negative for pelvic effusion or free air. Periumbilical ventral hernia now containing fat and loop of small bowel but no obstruction or incarceration. Musculoskeletal: No acute osseous abnormality. IMPRESSION: 1. No CT evidence for acute intrathoracic abnormality. 2. Hazy bilateral perinephric fat stranding and fat stranding at the renal pelvises left greater than right, findings are suggestive upper urinary tract infection/pyelonephritis. No hydronephrosis or obstructing stone. Correlate with urinalysis 3. Periumbilical ventral hernia now containing fat and loop of small bowel but no obstruction or incarceration. 4. Mild diverticular disease of the left colon without acute inflammatory process. Electronically Signed   By: Donavan Foil M.D.   On: 08/10/2022 20:00   CT Head Wo Contrast  Result Date: 08/06/2022 CLINICAL DATA:  Delirium. EXAM: CT HEAD WITHOUT CONTRAST TECHNIQUE: Contiguous axial images were obtained from the base of the skull through the vertex without intravenous contrast. RADIATION DOSE REDUCTION: This exam was performed according to the departmental dose-optimization program which includes automated exposure control, adjustment of the mA and/or kV according to patient size and/or use of iterative reconstruction technique. COMPARISON:  None Available. FINDINGS: Brain: The ventricles and sulci are appropriate size for the patient's age. The gray-white matter discrimination is preserved. There  is no acute intracranial hemorrhage. No mass effect or midline shift. No extra-axial fluid collection. Vascular: No hyperdense vessel or unexpected calcification. Skull: Normal. Negative for fracture or focal lesion. Sinuses/Orbits: No acute finding. Other: None IMPRESSION: No acute intracranial pathology. Electronically Signed   By: Anner Crete M.D.   On: 08/10/2022 19:53   DG Chest Port 1 View  Result Date: 08/11/2022 CLINICAL DATA:  Possible sepsis EXAM: PORTABLE CHEST 1 VIEW COMPARISON:  10/09/2018 FINDINGS: The heart size and mediastinal contours are within normal limits. Both lungs are clear. The visualized skeletal structures are unremarkable. Minimal atelectasis at the lingula and left base. IMPRESSION: No active disease. Minimal atelectasis at the lingula and left base. Electronically Signed   By: Donavan Foil M.D.   On: 08/15/2022 17:29     Assessment/Plan: Gram negative severe sepsis = currently on meropenem, which does have anaerobic coverage. Will add metronidazole '500mg'$  tid for the next 48hrs.  [facility] for Infectious Diseases Pager: [phone]  08/07/2022, 10:30 AM

## 2022-08-07 NOTE — Progress Notes (Signed)
   08/07/22 1400  Clinical Encounter Type  Visited With Patient and family together  Visit Type Initial;Critical Care;Social support;Spiritual support;Psychological support  Referral From Nurse  Consult/Referral To Chaplain  Recommendations Anticipatory Grief of partner  Spiritual Encounters  Spiritual Needs Sacred text;Prayer;Ritual;Emotional;Grief support  Stress Factors  Patient Stress Factors None identified  Family Stress Factors Loss;Loss of control;Major life changes    Chaplain met with patient and her partner of 25 years, who was at bedside - emotional.  Chaplain provided support to partner and patient for their spriitual counsel and prayer.  Will remain available as needed.  Ended visit with an anointing and departing blessing.

## 2022-08-07 NOTE — Procedures (Addendum)
Extubation Procedure Note  Patient Details:   Name: Michelle Cohen DOB: 1951-01-15 MRN: [id number]   Airway Documentation:    Vent end date: (not recorded) Vent end time: (not recorded)   Evaluation  O2 sats: stable throughout Complications: No apparent complications Patient did tolerate procedure well. Bilateral Breath Sounds: Clear, Diminished   No  Patient extubated to room air for comfort care per order. RN present at bedside. No signs of respiratory distress noted.  Levin Bacon 08/07/2022, 1:22 PM

## 2022-08-09 LAB — PATHOLOGIST SMEAR REVIEW: Path Review: REACTIVE

## 2022-08-16 LAB — CULTURE, BLOOD (ROUTINE X 2)
Special Requests: ADEQUATE
Special Requests: ADEQUATE

## 2022-08-16 LAB — ORGANISM ID, BACTERIA

## 2022-08-16 LAB — BACTERIAL ORGANISM REFLEX

## 2022-08-26 NOTE — Death Summary Note (Signed)
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 female, who presented to the [facility] with a chief complaint of nausea, vomiting, malase    They have a pertinent past medical history of B cell lymphoma and splenectomy    The patient developed nausea, vomiting, diarrhea on September 02, 2023 at 2AM. No melena or BRBPR. Generalized weakness. Denies sick contacts and recent abx. Endorses nonproductive cough, dyspnea, rigors, subjective fever.   At [facility] she was found to be hypotensive (BP 57/47), tachycardic, WBC 20.7, lactate greater than 9, trop 157, creat 2.47. CT chest/abd/pel WO concerning for possible right pyelo. Code sepsis initiated. 2L LR ordered. Patient started on levophed. BC ordered. Vanc/Cefe/Flagyl ordered. She was started on BiPAP for WOB. ED to ED transfer initiated. .  Patient developed overwhelming multiorgan failure from gram negative sepsis suspicion for GI source.  Despite aggressive care, broad spectrum abx, she continued to decline.  After discussion with her long-term partner we all agreed to allow her to pass peacefully.  Pertinent Labs and Studies  Significant Diagnostic Studies ECHOCARDIOGRAM COMPLETE  Result Date: 08/07/2022    ECHOCARDIOGRAM REPORT   Patient Name:   Michelle Cohen Date of Exam:  08/07/2022 Medical Rec #:  [id number]      Height:       65.0 in Accession #:    [id number]     Weight:       164.0 lb Date of Birth:  October 27, 1950     BSA:          1.818 m Patient Age:    63 years       BP:           94/55 mmHg Patient Gender: F              HR:           81 bpm. Exam Location:  Inpatient Procedure: 2D Echo, Cardiac Doppler, Color Doppler and Intracardiac            Opacification Agent STAT ECHO                                 MODIFIED REPORT:    This report was modified by Lyman Bishop MD on 08/07/2022 due to Further                             defined regional [facility].  Indications:     [facility]) [[id number]]  History:         Patient has no prior history of Echocardiogram examinations.                  Previous Myocardial Infarction.  Sonographer:     Bernadene Person RDCS Referring Phys:  [id number] [place] Diagnosing Phys: Lyman Bishop  MD IMPRESSIONS  1. Left ventricular ejection fraction, by estimation, is 40 to 45%. Left ventricular ejection fraction by 2D MOD biplane is 42.6 %. The left ventricle has mildly decreased function. The left ventricle demonstrates regional wall motion abnormalities (see  scoring diagram/findings for description). Left ventricular diastolic parameters are consistent with Grade I diastolic dysfunction (impaired relaxation). There is moderate hypokinesis of the left ventricular, basal-mid inferior wall, inferolateral wall and lateral wall.  2. Right ventricular systolic function is moderately reduced. The right ventricular size is mildly enlarged. There is normal pulmonary artery systolic pressure. The estimated right ventricular systolic pressure is 41.6 mmHg.  3. Left atrial size was mildly dilated.  4. The mitral valve is abnormal. Trivial mitral valve regurgitation.  5. The aortic valve is tricuspid. Aortic valve regurgitation is not visualized. Comparison(s): No prior Echocardiogram. Conclusion(s)/Recommendation(s): Critical findings reported to Dr. Erskine Emery  and acknowledged at 8:56 am. FINDINGS  Left Ventricle: Left ventricular ejection fraction, by estimation, is 40 to 45%. Left ventricular ejection fraction by 2D MOD biplane is 42.6 %. The left ventricle has mildly decreased function. The left ventricle demonstrates regional wall motion abnormalities. Moderate hypokinesis of the left ventricular, basal-mid inferior wall, inferolateral wall and lateral wall. Definity contrast agent was given IV to delineate the left ventricular endocardial borders. The left ventricular internal cavity size was normal in size. There is no left ventricular hypertrophy. Left ventricular diastolic parameters are consistent with Grade I diastolic dysfunction (impaired relaxation). Right Ventricle: The right ventricular size is mildly enlarged. No increase in right ventricular wall thickness. Right ventricular systolic function is moderately reduced. There is normal pulmonary artery systolic pressure. The tricuspid regurgitant velocity is 1.87 m/s, and with an assumed right atrial pressure of 15 mmHg, the estimated right ventricular systolic pressure is 60.6 mmHg. Left Atrium: Left atrial size was mildly dilated. Right Atrium: Right atrial size was normal in size. Pericardium: There is no evidence of pericardial effusion. Mitral Valve: The mitral valve is abnormal. There is mild thickening of the mitral valve leaflet(s). Trivial mitral valve regurgitation. Tricuspid Valve: The tricuspid valve is grossly normal. Tricuspid valve regurgitation is trivial. Aortic Valve: The aortic valve is tricuspid. Aortic valve regurgitation is not visualized. Pulmonic Valve: The pulmonic valve was not well visualized. Pulmonic valve regurgitation is not visualized. Aorta: The aortic root and ascending aorta are structurally normal, with no evidence of dilitation. Venous: IVC assessment for right atrial pressure unable to be performed due to mechanical ventilation. IAS/Shunts: No atrial level shunt detected  by color flow Doppler.  LEFT VENTRICLE PLAX 2D                        Biplane EF (MOD) LVIDd:         4.40 cm         LV Biplane EF:   Left LVIDs:         3.50 cm                          ventricular LV PW:         0.90 cm                          ejection LV IVS:        0.70 cm  fraction by LVOT diam:     2.10 cm                          2D MOD LV SV:         50                               biplane is LV SV Index:   27                               42.6 %. LVOT Area:     3.46 cm                                Diastology                                LV e' medial:    7.20 cm/s LV Volumes (MOD)               LV E/e' medial:  8.9 LV vol d, MOD    110.0 ml      LV e' lateral:   8.40 cm/s A2C:                           LV E/e' lateral: 7.7 LV vol d, MOD    90.8 ml A4C: LV vol s, MOD    63.4 ml A2C: LV vol s, MOD    53.9 ml A4C: LV SV MOD A2C:   46.6 ml LV SV MOD A4C:   90.8 ml LV SV MOD BP:    44.1 ml RIGHT VENTRICLE RV S prime:     10.40 cm/s TAPSE (M-mode): 1.6 cm LEFT ATRIUM             Index        RIGHT ATRIUM           Index LA diam:        3.70 cm 2.04 cm/m   RA Area:     18.20 cm LA Vol (A2C):   60.3 ml 33.17 ml/m  RA Volume:   45.50 ml  25.03 ml/m LA Vol (A4C):   69.2 ml 38.06 ml/m LA Biplane Vol: 65.6 ml 36.08 ml/m  AORTIC VALVE LVOT Vmax:   82.70 cm/s LVOT Vmean:  57.700 cm/s LVOT VTI:    0.144 m  AORTA Ao Root diam: 3.20 cm Ao Asc diam:  3.30 cm MITRAL VALVE               TRICUSPID VALVE MV Area (PHT): 3.42 cm    TR Peak grad:   14.0 mmHg MV Decel Time: 222 msec    TR Vmax:        187.00 cm/s MV E velocity: 64.30 cm/s MV A velocity: 31.20 cm/s  SHUNTS MV E/A ratio:  2.06        Systemic VTI:  0.14 m                            Systemic Diam: 2.10 cm Lyman Bishop MD Electronically signed by Lyman Bishop MD Signature Date/Time: 08/07/2022/9:05:38 AM    Final (Updated)  DG Abd 1 View  Result Date: 08/06/2022 CLINICAL DATA:  71 year old female intubated, enteric tube  placement. EXAM: ABDOMEN - 1 VIEW COMPARISON:  Portable chest 0552 hours today. FINDINGS: Portable AP semi upright view at 0553 hours. Enteric tube looped in the stomach which is gas distended. Increased gas-filled proximal small bowel loops also when compared to the CT Chest, Abdomen, and Pelvis yesterday. Patchy and confluent retrocardiac opacity at the left lung base. IMPRESSION: 1. Satisfactory enteric tube placement into the stomach which is gas distended along with some proximal small bowel. Consider NG tube to suction. 2. Left lower lobe collapse or consolidation. Electronically Signed   By: Genevie Ann M.D.   On: 08/06/2022 06:41   DG Chest 1 View  Result Date: 08/06/2022 CLINICAL DATA:  71 year old female intubated, enteric tube placement. EXAM: CHEST  1 VIEW COMPARISON:  0105 hours portable chest today. FINDINGS: Portable AP semi upright view at 0552 hours. Trachea is located to the right of midline as demonstrated by chest CT yesterday. Endotracheal tube does project over the tracheal air column and tip is above the carina. Enteric tube courses to the abdomen and is looped in the left upper quadrant. There is moderate to severe gas distension of the stomach. Left subclavian central line is stable. Stable somewhat low lung volumes. Increased retrocardiac opacity which now resembles consolidation, largely new from the chest CT yesterday. No pneumothorax or pulmonary edema. No definite pleural effusion. No acute osseous abnormality identified. IMPRESSION: 1. Satisfactory ET tube and enteric tube placement. Gas distended stomach, consider NG tube to suction. 2. Increasing left lower lobe consolidation. Electronically Signed   By: Genevie Ann M.D.   On: 08/06/2022 06:40   DG CHEST PORT 1 VIEW  Result Date: 08/06/2022 CLINICAL DATA:  [id number] Encounter for central line placement [id number] EXAM: PORTABLE CHEST 1 VIEW COMPARISON:  CT chest 08/13/2022. chest x-ray 08/22/2022 FINDINGS: Interval placement of a left  subclavian access central venous catheter with tip overlying the expected region the superior cavoatrial junction. The heart and mediastinal contours are unremarkable given AP portable technique. Retrocardiac airspace opacity likely atelectasis. Mildly increased interstitial markings. No pleural effusion. No pneumothorax. No acute osseous abnormality. IMPRESSION: 1. Mild pulmonary edema. 2. Retrocardiac airspace opacity likely atelectasis. 3. Interval placement of a left subclavian venous catheter with tip overlying the expected region the superior cavoatrial junction. Electronically Signed   By: Iven Finn M.D.   On: 08/06/2022 01:47   CT CHEST ABDOMEN PELVIS WO CONTRAST  Result Date: 08/04/2022 CLINICAL DATA:  Sepsis EXAM: CT CHEST, ABDOMEN AND PELVIS WITHOUT CONTRAST TECHNIQUE: Multidetector CT imaging of the chest, abdomen and pelvis was performed following the standard protocol without IV contrast. RADIATION DOSE REDUCTION: This exam was performed according to the departmental dose-optimization program which includes automated exposure control, adjustment of the mA and/or kV according to patient size and/or use of iterative reconstruction technique. COMPARISON:  CT 12/31/2019 FINDINGS: CT CHEST FINDINGS Cardiovascular: Limited evaluation without intravenous contrast. Mild atherosclerosis. No aneurysm. Normal cardiac size. Trace pericardial effusion Mediastinum/Nodes: Midline trachea. No thyroid mass. No suspicious lymph nodes. Esophagus within normal limits. Lungs/Pleura: No consolidation or pleural effusion. Mild dependent atelectasis. Musculoskeletal: Sternum is intact.  No acute osseous abnormality. CT ABDOMEN PELVIS FINDINGS Hepatobiliary: No focal liver abnormality is seen. No gallstones, gallbladder wall thickening, or biliary dilatation. Pancreas: Unremarkable. No pancreatic ductal dilatation or surrounding inflammatory changes. Spleen: Splenectomy Adrenals/Urinary Tract: Adrenal glands are  within normal limits. Hazy bilateral perinephric fat stranding and fat stranding  at the renal pelvises left greater than right. No hydronephrosis. No obstructing stone. The bladder is unremarkable Stomach/Bowel: The stomach is nonenlarged. No dilated small bowel. Collapsed appearance of the colon, no inflammation. Mild diverticular disease of the left colon. Appendix contains small stone but is otherwise normal in appearance. Vascular/Lymphatic: Mild atherosclerosis. No aneurysm. No suspicious lymph nodes Reproductive: Uterus and bilateral adnexa are unremarkable. Other: Negative for pelvic effusion or free air. Periumbilical ventral hernia now containing fat and loop of small bowel but no obstruction or incarceration. Musculoskeletal: No acute osseous abnormality. IMPRESSION: 1. No CT evidence for acute intrathoracic abnormality. 2. Hazy bilateral perinephric fat stranding and fat stranding at the renal pelvises left greater than right, findings are suggestive upper urinary tract infection/pyelonephritis. No hydronephrosis or obstructing stone. Correlate with urinalysis 3. Periumbilical ventral hernia now containing fat and loop of small bowel but no obstruction or incarceration. 4. Mild diverticular disease of the left colon without acute inflammatory process. Electronically Signed   By: Donavan Foil M.D.   On: 08/23/2022 20:00   CT Head Wo Contrast  Result Date: 08/11/2022 CLINICAL DATA:  Delirium. EXAM: CT HEAD WITHOUT CONTRAST TECHNIQUE: Contiguous axial images were obtained from the base of the skull through the vertex without intravenous contrast. RADIATION DOSE REDUCTION: This exam was performed according to the departmental dose-optimization program which includes automated exposure control, adjustment of the mA and/or kV according to patient size and/or use of iterative reconstruction technique. COMPARISON:  None Available. FINDINGS: Brain: The ventricles and sulci are appropriate size for the  patient's age. The gray-white matter discrimination is preserved. There is no acute intracranial hemorrhage. No mass effect or midline shift. No extra-axial fluid collection. Vascular: No hyperdense vessel or unexpected calcification. Skull: Normal. Negative for fracture or focal lesion. Sinuses/Orbits: No acute finding. Other: None IMPRESSION: No acute intracranial pathology. Electronically Signed   By: Anner Crete M.D.   On: 08/14/2022 19:53   DG Chest Port 1 View  Result Date: 08/11/2022 CLINICAL DATA:  Possible sepsis EXAM: PORTABLE CHEST 1 VIEW COMPARISON:  10/09/2018 FINDINGS: The heart size and mediastinal contours are within normal limits. Both lungs are clear. The visualized skeletal structures are unremarkable. Minimal atelectasis at the lingula and left base. IMPRESSION: No active disease. Minimal atelectasis at the lingula and left base. Electronically Signed   By: Donavan Foil M.D.   On: 08/23/2022 17:29    Microbiology No results found for this or any previous visit (from the past 240 hour(s)).  Lab Basic Metabolic Panel: No results for input(s): "NA", "K", "CL", "CO2", "GLUCOSE", "BUN", "CREATININE", "CALCIUM", "MG", "PHOS" in the last 168 hours. Liver Function Tests: No results for input(s): "AST", "ALT", "ALKPHOS", "BILITOT", "PROT", "ALBUMIN" in the last 168 hours. No results for input(s): "LIPASE", "AMYLASE" in the last 168 hours. No results for input(s): "AMMONIA" in the last 168 hours. CBC: No results for input(s): "WBC", "NEUTROABS", "HGB", "HCT", "MCV", "PLT" in the last 168 hours. Cardiac Enzymes: No results for input(s): "CKTOTAL", "CKMB", "CKMBINDEX", "TROPONINI" in the last 168 hours. Sepsis Labs: No results for input(s): "PROCALCITON", "WBC", "LATICACIDVEN" in the last 168 hours.   Candee Furbish 08/16/2022, 8:43 PM

## 2022-08-26 DEATH — deceased

## 2022-11-07 ENCOUNTER — Other Ambulatory Visit: Payer: Medicare Other

## 2022-11-07 ENCOUNTER — Ambulatory Visit: Payer: Medicare Other | Admitting: Hematology

## 2022-11-17 ENCOUNTER — Ambulatory Visit: Payer: Medicare Other

## 2022-11-17 ENCOUNTER — Other Ambulatory Visit: Payer: Medicare Other
